# Patient Record
Sex: Male | Born: 1965
Health system: Southern US, Community
[De-identification: ages and names within clinical notes are randomized; demographics above are authoritative.]

## PROBLEM LIST (undated history)

## (undated) DIAGNOSIS — F329 Major depressive disorder, single episode, unspecified: Secondary | ICD-10-CM

## (undated) DIAGNOSIS — R569 Unspecified convulsions: Secondary | ICD-10-CM

## (undated) DIAGNOSIS — I429 Cardiomyopathy, unspecified: Secondary | ICD-10-CM

## (undated) DIAGNOSIS — F32A Depression, unspecified: Secondary | ICD-10-CM

## (undated) DIAGNOSIS — B20 Human immunodeficiency virus [HIV] disease: Secondary | ICD-10-CM

## (undated) DIAGNOSIS — I639 Cerebral infarction, unspecified: Secondary | ICD-10-CM

## (undated) DIAGNOSIS — F17200 Nicotine dependence, unspecified, uncomplicated: Secondary | ICD-10-CM

## (undated) DIAGNOSIS — A159 Respiratory tuberculosis unspecified: Secondary | ICD-10-CM

## (undated) DIAGNOSIS — I10 Essential (primary) hypertension: Secondary | ICD-10-CM

## (undated) HISTORY — PX: FRACTURE SURGERY: SHX138

## (undated) HISTORY — DX: Nicotine dependence, unspecified, uncomplicated: F17.200

## (undated) HISTORY — DX: Human immunodeficiency virus (HIV) disease: B20

## (undated) HISTORY — PX: MULTIPLE TOOTH EXTRACTIONS: SHX2053

---
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
## 2014-10-31 ENCOUNTER — Emergency Department (HOSPITAL_COMMUNITY): Payer: Medicaid Other

## 2014-10-31 ENCOUNTER — Inpatient Hospital Stay (HOSPITAL_COMMUNITY)
Admission: EM | Admit: 2014-10-31 | Discharge: 2014-11-05 | DRG: 062 | Disposition: A | Discharge status: Rehabilitation Facility | Payer: Medicaid Other | Attending: Neurology | Admitting: Neurology

## 2014-10-31 ENCOUNTER — Encounter (HOSPITAL_COMMUNITY): Payer: Self-pay

## 2014-10-31 DIAGNOSIS — I351 Nonrheumatic aortic (valve) insufficiency: Secondary | ICD-10-CM | POA: Diagnosis not present

## 2014-10-31 DIAGNOSIS — I119 Hypertensive heart disease without heart failure: Secondary | ICD-10-CM | POA: Diagnosis present

## 2014-10-31 DIAGNOSIS — R531 Weakness: Secondary | ICD-10-CM | POA: Diagnosis present

## 2014-10-31 DIAGNOSIS — I509 Heart failure, unspecified: Secondary | ICD-10-CM | POA: Diagnosis not present

## 2014-10-31 DIAGNOSIS — R197 Diarrhea, unspecified: Secondary | ICD-10-CM | POA: Diagnosis not present

## 2014-10-31 DIAGNOSIS — G8194 Hemiplegia, unspecified affecting left nondominant side: Secondary | ICD-10-CM | POA: Diagnosis present

## 2014-10-31 DIAGNOSIS — I63421 Cerebral infarction due to embolism of right anterior cerebral artery: Secondary | ICD-10-CM | POA: Diagnosis present

## 2014-10-31 DIAGNOSIS — R471 Dysarthria and anarthria: Secondary | ICD-10-CM | POA: Diagnosis present

## 2014-10-31 DIAGNOSIS — I5023 Acute on chronic systolic (congestive) heart failure: Secondary | ICD-10-CM | POA: Diagnosis not present

## 2014-10-31 DIAGNOSIS — R339 Retention of urine, unspecified: Secondary | ICD-10-CM | POA: Diagnosis not present

## 2014-10-31 DIAGNOSIS — I10 Essential (primary) hypertension: Secondary | ICD-10-CM | POA: Diagnosis present

## 2014-10-31 DIAGNOSIS — E876 Hypokalemia: Secondary | ICD-10-CM | POA: Diagnosis present

## 2014-10-31 DIAGNOSIS — I429 Cardiomyopathy, unspecified: Secondary | ICD-10-CM | POA: Diagnosis present

## 2014-10-31 DIAGNOSIS — I110 Hypertensive heart disease with heart failure: Secondary | ICD-10-CM | POA: Diagnosis not present

## 2014-10-31 DIAGNOSIS — I6931 Cognitive deficits following cerebral infarction: Secondary | ICD-10-CM | POA: Diagnosis not present

## 2014-10-31 DIAGNOSIS — M6289 Other specified disorders of muscle: Secondary | ICD-10-CM | POA: Diagnosis not present

## 2014-10-31 DIAGNOSIS — E785 Hyperlipidemia, unspecified: Secondary | ICD-10-CM | POA: Diagnosis present

## 2014-10-31 DIAGNOSIS — I6789 Other cerebrovascular disease: Secondary | ICD-10-CM

## 2014-10-31 DIAGNOSIS — F17290 Nicotine dependence, other tobacco product, uncomplicated: Secondary | ICD-10-CM | POA: Diagnosis present

## 2014-10-31 DIAGNOSIS — G8190 Hemiplegia, unspecified affecting unspecified side: Secondary | ICD-10-CM | POA: Diagnosis not present

## 2014-10-31 DIAGNOSIS — I63521 Cerebral infarction due to unspecified occlusion or stenosis of right anterior cerebral artery: Secondary | ICD-10-CM | POA: Diagnosis present

## 2014-10-31 DIAGNOSIS — I63321 Cerebral infarction due to thrombosis of right anterior cerebral artery: Secondary | ICD-10-CM | POA: Diagnosis not present

## 2014-10-31 DIAGNOSIS — I639 Cerebral infarction, unspecified: Secondary | ICD-10-CM

## 2014-10-31 DIAGNOSIS — F1210 Cannabis abuse, uncomplicated: Secondary | ICD-10-CM | POA: Diagnosis present

## 2014-10-31 DIAGNOSIS — Z720 Tobacco use: Secondary | ICD-10-CM | POA: Diagnosis not present

## 2014-10-31 DIAGNOSIS — F17210 Nicotine dependence, cigarettes, uncomplicated: Secondary | ICD-10-CM | POA: Diagnosis present

## 2014-10-31 HISTORY — DX: Cardiomyopathy, unspecified: I42.9

## 2014-10-31 HISTORY — DX: Cerebral infarction, unspecified: I63.9

## 2014-10-31 HISTORY — DX: Essential (primary) hypertension: I10

## 2014-10-31 LAB — RAPID URINE DRUG SCREEN, HOSP PERFORMED
Amphetamines: NOT DETECTED
BENZODIAZEPINES: NOT DETECTED
Barbiturates: NOT DETECTED
Cocaine: NOT DETECTED
OPIATES: NOT DETECTED
TETRAHYDROCANNABINOL: POSITIVE — AB

## 2014-10-31 LAB — URINALYSIS, ROUTINE W REFLEX MICROSCOPIC
Bilirubin Urine: NEGATIVE
Glucose, UA: NEGATIVE mg/dL
Hgb urine dipstick: NEGATIVE
Ketones, ur: NEGATIVE mg/dL
Leukocytes, UA: NEGATIVE
NITRITE: NEGATIVE
PH: 6.5 (ref 5.0–8.0)
Protein, ur: 30 mg/dL — AB
Specific Gravity, Urine: 1.019 (ref 1.005–1.030)
Urobilinogen, UA: 1 mg/dL (ref 0.0–1.0)

## 2014-10-31 LAB — DIFFERENTIAL
BASOS PCT: 0 % (ref 0–1)
Basophils Absolute: 0 10*3/uL (ref 0.0–0.1)
Eosinophils Absolute: 0.5 10*3/uL (ref 0.0–0.7)
Eosinophils Relative: 9 % — ABNORMAL HIGH (ref 0–5)
Lymphocytes Relative: 19 % (ref 12–46)
Lymphs Abs: 1.1 10*3/uL (ref 0.7–4.0)
Monocytes Absolute: 0.5 10*3/uL (ref 0.1–1.0)
Monocytes Relative: 9 % (ref 3–12)
Neutro Abs: 3.7 10*3/uL (ref 1.7–7.7)
Neutrophils Relative %: 63 % (ref 43–77)

## 2014-10-31 LAB — COMPREHENSIVE METABOLIC PANEL
ALBUMIN: 3.2 g/dL — AB (ref 3.5–5.2)
ALK PHOS: 69 U/L (ref 39–117)
ALT: 16 U/L (ref 0–53)
AST: 38 U/L — ABNORMAL HIGH (ref 0–37)
Anion gap: 8 (ref 5–15)
BUN: 16 mg/dL (ref 6–23)
CALCIUM: 8.6 mg/dL (ref 8.4–10.5)
CO2: 23 mmol/L (ref 19–32)
Chloride: 105 mmol/L (ref 96–112)
Creatinine, Ser: 1.17 mg/dL (ref 0.50–1.35)
GFR calc non Af Amer: 73 mL/min — ABNORMAL LOW (ref >90)
GFR, EST AFRICAN AMERICAN: 84 mL/min — AB (ref >90)
Glucose, Bld: 119 mg/dL — ABNORMAL HIGH (ref 70–99)
POTASSIUM: 2.9 mmol/L — AB (ref 3.5–5.1)
SODIUM: 136 mmol/L (ref 135–145)
Total Bilirubin: 0.7 mg/dL (ref 0.3–1.2)
Total Protein: 7.7 g/dL (ref 6.0–8.3)

## 2014-10-31 LAB — I-STAT CHEM 8, ED
BUN: 18 mg/dL (ref 6–23)
CHLORIDE: 102 mmol/L (ref 96–112)
Calcium, Ion: 1.11 mmol/L — ABNORMAL LOW (ref 1.12–1.23)
Creatinine, Ser: 1 mg/dL (ref 0.50–1.35)
Glucose, Bld: 123 mg/dL — ABNORMAL HIGH (ref 70–99)
HEMATOCRIT: 44 % (ref 39.0–52.0)
HEMOGLOBIN: 15 g/dL (ref 13.0–17.0)
Potassium: 2.9 mmol/L — ABNORMAL LOW (ref 3.5–5.1)
SODIUM: 140 mmol/L (ref 135–145)
TCO2: 20 mmol/L (ref 0–100)

## 2014-10-31 LAB — CBG MONITORING, ED
Glucose-Capillary: 120 mg/dL — ABNORMAL HIGH (ref 70–99)
Glucose-Capillary: 136 mg/dL — ABNORMAL HIGH (ref 70–99)

## 2014-10-31 LAB — GLUCOSE, CAPILLARY
GLUCOSE-CAPILLARY: 77 mg/dL (ref 70–99)
Glucose-Capillary: 146 mg/dL — ABNORMAL HIGH (ref 70–99)
Glucose-Capillary: 114 mg/dL — ABNORMAL HIGH (ref 70–99)
Glucose-Capillary: 85 mg/dL (ref 70–99)

## 2014-10-31 LAB — ETHANOL

## 2014-10-31 LAB — CBC
HCT: 40.8 % (ref 39.0–52.0)
Hemoglobin: 12.9 g/dL — ABNORMAL LOW (ref 13.0–17.0)
MCH: 29.9 pg (ref 26.0–34.0)
MCHC: 31.6 g/dL (ref 30.0–36.0)
MCV: 94.4 fL (ref 78.0–100.0)
Platelets: 152 10*3/uL (ref 150–400)
RBC: 4.32 MIL/uL (ref 4.22–5.81)
RDW: 14.6 % (ref 11.5–15.5)
WBC: 5.9 10*3/uL (ref 4.0–10.5)

## 2014-10-31 LAB — URINE MICROSCOPIC-ADD ON

## 2014-10-31 LAB — APTT: aPTT: 32 seconds (ref 24–37)

## 2014-10-31 LAB — I-STAT TROPONIN, ED: TROPONIN I, POC: 0.07 ng/mL (ref 0.00–0.08)

## 2014-10-31 LAB — MRSA PCR SCREENING: MRSA BY PCR: NEGATIVE

## 2014-10-31 LAB — PROTIME-INR
INR: 1.28 (ref 0.00–1.49)
PROTHROMBIN TIME: 16.1 s — AB (ref 11.6–15.2)

## 2014-10-31 LAB — POTASSIUM: Potassium: 3.3 mmol/L — ABNORMAL LOW (ref 3.5–5.1)

## 2014-10-31 MED ORDER — SODIUM CHLORIDE 0.9 % IV SOLN
INTRAVENOUS | Status: DC
  Administered 2014-10-31 – 2014-11-01 (×2): via INTRAVENOUS

## 2014-10-31 MED ORDER — POTASSIUM CHLORIDE CRYS ER 20 MEQ PO TBCR: 20.0000 meq | EXTENDED_RELEASE_TABLET | Freq: Two times a day (BID) | ORAL | Status: DC

## 2014-10-31 MED ORDER — LABETALOL HCL 5 MG/ML IV SOLN
20.0000 mg | INTRAVENOUS | Status: DC | PRN
  Administered 2014-10-31 – 2014-11-05 (×16): 20 mg via INTRAVENOUS
  Filled 2014-10-31 (×16): qty 4

## 2014-10-31 MED ORDER — NICARDIPINE HCL IN NACL 20-0.86 MG/200ML-% IV SOLN
3.0000 mg/h | Freq: Once | INTRAVENOUS | Status: AC
  Administered 2014-10-31: 5 mg/h via INTRAVENOUS
  Filled 2014-10-31: qty 200

## 2014-10-31 MED ORDER — LABETALOL HCL 5 MG/ML IV SOLN
20.0000 mg | Freq: Once | INTRAVENOUS | Status: AC
  Administered 2014-10-31: 20 mg via INTRAVENOUS

## 2014-10-31 MED ORDER — ALTEPLASE (STROKE) FULL DOSE INFUSION
90.0000 mg | Freq: Once | INTRAVENOUS | Status: AC
  Administered 2014-10-31: 90 mg via INTRAVENOUS
  Filled 2014-10-31: qty 90

## 2014-10-31 MED ORDER — POTASSIUM CHLORIDE 10 MEQ/100ML IV SOLN
10.0000 meq | Freq: Once | INTRAVENOUS | Status: DC
  Filled 2014-10-31: qty 100

## 2014-10-31 MED ORDER — WHITE PETROLATUM GEL
Status: AC
  Filled 2014-10-31: qty 1

## 2014-10-31 MED ORDER — ACETAMINOPHEN 325 MG PO TABS
650.0000 mg | ORAL_TABLET | ORAL | Status: DC | PRN
  Administered 2014-11-03: 650 mg via ORAL
  Filled 2014-10-31: qty 2

## 2014-10-31 MED ORDER — STROKE: EARLY STAGES OF RECOVERY BOOK
Freq: Once | Status: DC
  Filled 2014-10-31: qty 1

## 2014-10-31 MED ORDER — POTASSIUM CHLORIDE 10 MEQ/100ML IV SOLN
10.0000 meq | INTRAVENOUS | Status: AC
  Administered 2014-10-31 – 2014-11-01 (×2): 10 meq via INTRAVENOUS
  Filled 2014-10-31 (×2): qty 100

## 2014-10-31 MED ORDER — POTASSIUM CHLORIDE 10 MEQ/100ML IV SOLN
10.0000 meq | Freq: Once | INTRAVENOUS | Status: AC
  Administered 2014-10-31: 10 meq via INTRAVENOUS

## 2014-10-31 MED ORDER — LABETALOL HCL 5 MG/ML IV SOLN
10.0000 mg | Freq: Once | INTRAVENOUS | Status: DC
  Filled 2014-10-31: qty 4

## 2014-10-31 MED ORDER — PANTOPRAZOLE SODIUM 40 MG IV SOLR
40.0000 mg | Freq: Every day | INTRAVENOUS | Status: DC
  Administered 2014-10-31 – 2014-11-04 (×5): 40 mg via INTRAVENOUS
  Filled 2014-10-31 (×6): qty 40

## 2014-10-31 MED ORDER — LABETALOL HCL 5 MG/ML IV SOLN
10.0000 mg | INTRAVENOUS | Status: DC | PRN
  Administered 2014-11-01 – 2014-11-03 (×4): 10 mg via INTRAVENOUS
  Filled 2014-10-31 (×3): qty 4

## 2014-10-31 MED ORDER — LORAZEPAM 2 MG/ML IJ SOLN
2.0000 mg | Freq: Once | INTRAMUSCULAR | Status: AC
  Administered 2014-11-01: 2 mg via INTRAVENOUS
  Filled 2014-10-31: qty 1

## 2014-10-31 MED ORDER — ACETAMINOPHEN 650 MG RE SUPP: 650.0000 mg | RECTAL | Status: DC | PRN

## 2014-10-31 MED ORDER — SODIUM CHLORIDE 0.9 % IV SOLN: 50.0000 mL | Freq: Once | INTRAVENOUS | Status: AC

## 2014-10-31 NOTE — ED Notes (Signed)
Pt transported to MRI with this RN. 

## 2014-10-31 NOTE — ED Provider Notes (Signed)
CSN: [id number]     Arrival date & time 10/31/14  0457 History   First MD Initiated Contact with Patient 10/31/14 0507     Chief complaint: Code stroke  (Consider location/radiation/quality/duration/timing/severity/associated sxs/prior Treatment) The history is provided by the patient and the spouse.   49 year old male with history of hypertension for which she does not take any medication got up to walk to the bathroom and noted on walking back fed his legs were not working well. He was having difficulty moving his left leg. Is also having some difficulty with his left arm. His wife noted that he was on the floor and couldn't get up. Speech was noted to be slurred initially. EMS was called to bring him to the hospital. He denies any headache and denies chest pain. There is no nausea or vomiting. He is a nonsmoker and occasional drinker.  No past medical history on file. No past surgical history on file. No family history on file. History  Substance Use Topics  . Smoking status: Not on file  . Smokeless tobacco: Not on file  . Alcohol Use: Not on file    Review of Systems  All other systems reviewed and are negative.     Allergies  Review of patient's allergies indicates not on file.  Home Medications   Prior to Admission medications   Not on File   There were no vitals taken for this visit. Physical Exam  Nursing note and vitals reviewed.  49 year old male, resting comfortably and in no acute distress. Vital signs are significant for hypertension. Oxygen saturation is 97%, which is normal. Head is normocephalic and atraumatic. PERRLA, EOMI. Oropharynx is clear. Neck is nontender and supple without adenopathy or JVD. There are no carotid bruits. Back is nontender and there is no CVA tenderness. Lungs are clear without rales, wheezes, or rhonchi. Chest is nontender. Heart has regular rate and rhythm without murmur. Abdomen is soft, flat, nontender without masses or  hepatosplenomegaly and peristalsis is normoactive. Extremities have no cyanosis or edema, full range of motion is present. Skin is warm and dry without rash. Neurologic: He is awake, but slow to respond. Speech is not dysarthric. Cranial nerves are intact. He does not voluntarily move his left arm or leg, but when his left arm is lifted, it is held in that position. When dropped, he will not allow it to hit his forehead. There are no Babinski reflexes.  ED Course  Procedures (including critical care time) Labs Review Labs Reviewed  CBC - Abnormal; Notable for the following:    Hemoglobin 12.9 (*)    All other components within normal limits  DIFFERENTIAL - Abnormal; Notable for the following:    Eosinophils Relative 9 (*)    All other components within normal limits  I-STAT CHEM 8, ED - Abnormal; Notable for the following:    Potassium 2.9 (*)    Glucose, Bld 123 (*)    Calcium, Ion 1.11 (*)    All other components within normal limits  CBG MONITORING, ED - Abnormal; Notable for the following:    Glucose-Capillary 120 (*)    All other components within normal limits  ETHANOL  PROTIME-INR  APTT  COMPREHENSIVE METABOLIC PANEL  URINE RAPID DRUG SCREEN (HOSP PERFORMED)  URINALYSIS, ROUTINE W REFLEX MICROSCOPIC  I-STAT TROPOININ, ED  I-STAT TROPOININ, ED    Imaging Review Ct Head Wo Contrast  10/31/2014   CLINICAL DATA:  Code stroke.  Slurred speech in extremity weakness.  EXAM:  CT HEAD WITHOUT CONTRAST  TECHNIQUE: Contiguous axial images were obtained from the base of the skull through the vertex without intravenous contrast.  COMPARISON:  None.  FINDINGS: Ventricles and sulci appear symmetrical. Mild low-attenuation change in the deep white matter consistent small vessel ischemia. No ventricular dilatation. Old lacunar infarct in the pons. No mass effect or midline shift. No abnormal extra-axial fluid collections. Gray-white matter junctions are distinct. Basal cisterns are not  effaced. No evidence of acute intracranial hemorrhage. No depressed skull fractures. Mucosal thickening in the paranasal sinuses. Mastoid air cells are not opacified.  IMPRESSION: No acute intracranial abnormalities. Mild chronic small vessel ischemic changes. Old lacunar infarct in the pons.  These results were called by telephone at the time of interpretation on 10/31/2014 at 5:10 am to Dr. Dione Booze , who verbally acknowledged these results.   Electronically Signed   By: Burman Nieves M.D.   On: 10/31/2014 05:12   Mr Brain Ltd W/o Cm  10/31/2014   CLINICAL DATA:  Initial evaluation for acute stroke, sudden onset of inability to walk as well as use left arm.  EXAM: MRI HEAD WITHOUT CONTRAST  TECHNIQUE: Multiplanar, multiecho pulse sequences of the brain and surrounding structures were obtained without intravenous contrast.  COMPARISON:  Prior CT performed earlier on the same day.  FINDINGS: Study is limited as patient was only able to tolerate diffusion-weighted sequences.  Diffusion-weighted imaging demonstrates focal restricted diffusion within the right ACA territory involving the right parasagittal frontal lobe. There appears to be involvement of both the anterior genu, body, and posterior aspect of the corpus callosum. There is corresponding signal dropout on ADC map.  No other area of acute infarct. No other definite acute abnormality identified.  IMPRESSION: Acute ischemic right ACA territory infarct. Please note that this study is limited as only axial diffusion-weighted sequences were performed. No other definite intracranial abnormality.   Electronically Signed   By: Rise Mu M.D.   On: 10/31/2014 06:36   Images viewed by me, discussed with radiologist.   EKG Interpretation   Date/Time:  Saturday October 31 2014 05:16:11 EDT Ventricular Rate:  85 PR Interval:  197 QRS Duration: 110 QT Interval:  401 QTC Calculation: 477 R Axis:   -53 Text Interpretation:  Sinus rhythm Atrial  premature complexes LVH with  secondary repolarization abnormality Borderline prolonged QT interval  Baseline wander in lead(s) I III aVL No old tracing to compare Confirmed  by [facility]  MD, Kawanda Drumheller ([id number]) on 10/31/2014 5:26:07 AM     CRITICAL CARE Performed by: UEAVW,UJWJX Total critical care time: 50 minutes Critical care time was exclusive of separately billable procedures and treating other patients. Critical care was necessary to treat or prevent imminent or life-threatening deterioration. Critical care was time spent personally by me on the following activities: development of treatment plan with patient and/or surrogate as well as nursing, discussions with consultants, evaluation of patient's response to treatment, examination of patient, obtaining history from patient or surrogate, ordering and performing treatments and interventions, ordering and review of laboratory studies, ordering and review of radiographic studies, pulse oximetry and re-evaluation of patient's condition.  MDM   Final diagnoses:  Stroke  Essential hypertension  Hypokalemia    Left-sided weakness that would be worrisome for stroke, but picture is also somewhat concerning for conversion reaction. Uncontrolled hypertension. He is being given labetalol for blood pressure control. He is seen along with Dr. Hosie Poisson, neuro-hospitalist. He will be sent for diffusion-weighted MRI to determine whether he is  having an acute stroke.  MRI does confirm a stroke although it is somewhat atypical in appearance. Dr. Hosie Poisson is here to initiate thrombolytic therapy. Blood pressure is down but needs to be brought down further prior to initiating thrombolytic therapy. He is also noted to be hypokalemic and is given intravenous potassium.  Dione Booze, MD 10/31/14 [phone]

## 2014-10-31 NOTE — Progress Notes (Signed)
Late entry:  Notified by RRRN that tPA needed at 0612. Delivered to bedside at [phone].   Vernard GamblesVeronda Alvis Pulcini, PharmD, BCPS 10/31/2014 7:37 AM

## 2014-10-31 NOTE — ED Notes (Signed)
Neuro at bedside.

## 2014-10-31 NOTE — ED Notes (Addendum)
Attempted to call report

## 2014-10-31 NOTE — ED Notes (Addendum)
Pt went to bathroom at 0400, upon retuning to bed the pt stated that his legs felt like jelly and were weak pt fell on bed

## 2014-10-31 NOTE — ED Notes (Signed)
Spoke with dr Amada Jupiterkirkpatrick regarding bleeding gums, MD assessed patient and states to continue tpa administration, pt also became diaphoretic and repetitive questioning noted, Neuro aware.CBg checked and 136

## 2014-10-31 NOTE — ED Notes (Signed)
Pts gums bleeding rapid response notified

## 2014-10-31 NOTE — ED Notes (Signed)
Attempted to call report

## 2014-10-31 NOTE — Progress Notes (Signed)
  Echocardiogram 2D Echocardiogram has been performed.  [name] Short 10/31/2014, 9:45 AM

## 2014-10-31 NOTE — Progress Notes (Signed)
Chaplain responded to request by RN to provide support to wife, who was teary.  Wife Ike BeneSheriviam and daughter Elmarie Shileyiffany were bedside. Provided emotional support and orientation to services. Offered drinks.  Checked back with daughter who was waiting while patient is undergoing a test.  Wife has stepped outside.  Will check back with her.  Please call as continued support is needed.   Debby Budalacios, Arsenio Schnorr MarshallN, IowaChaplain [phone]

## 2014-10-31 NOTE — Consult Note (Addendum)
Stroke Consult    Chief Complaint: left sided weakness  HPI: [name] Short is an 49 y.o. male history of HTN presenting with acute onset of left sided weakness. Per patient, he awoke normal at 0400, went to the bathroom, on the way back he fell due to weakness of his left side. Upon EMS arrival he was noted to have BP of 220/110 and appeared to be slumping to the left. They note his mental status appeared to fluctuate. Family reports poor compliance with his home BP medications.   CT head imaging reviewed and no acute process. Due to inconsistent nature of exam he was taken for a DWI which showed acute infarct. Initial NIHSS of 7.   Date last known well: 10/31/2014 Time last known well: 0400 tPA Given: yes, patient had refractory hypertension requiring multiple doses of IV labetalol and a cardene drip. This resulted in a delay in initiating tPA.   No past medical history on file.  No past surgical history on file.  No family history on file. Social History:  has no tobacco, alcohol, and drug history on file.  Allergies: Allergies not on file   (Not in a hospital admission)  ROS: Out of a complete 14 system review, the patient complains of only the following symptoms, and all other reviewed systems are negative. + weakness  Physical Examination: There were no vitals filed for this visit. Physical Exam  Constitutional: He appears well-developed and well-nourished.  Psych: Affect appropriate to situation Eyes: No scleral injection HENT: No OP obstrucion Head: Normocephalic.  Cardiovascular: Normal rate and regular rhythm.  Respiratory: Effort normal and breath sounds normal.  GI: Soft. Bowel sounds are normal. No distension. There is no tenderness.  Skin: WDI   Neurologic Examination: Mental Status: Lethargic but easily aroused by voice, follows simple and multi-step commands.  Speech fluent without evidence of aphasia.  Mild to moderate dysarthria. Cranial Nerves: II:  funduscopic exam wnl bilaterally, visual fields grossly normal, pupils equal, round, reactive to light and accommodation III,IV, VI: ptosis not present, extra-ocular motions intact bilaterally V,VII: smile symmetric, facial light touch sensation normal bilaterally VIII: hearing normal bilaterally IX,X: gag reflex present XI: trapezius strength/neck flexion strength normal bilaterally XII: tongue strength normal  Motor: LUE pronator drift. Arm drops when lifted but always protects his face RUE 5/5 strength LLE unable to raise off the bed but + Hoover sign RLE 5/5 strength Sensory: Pinprick and light touch intact throughout, bilaterally Deep Tendon Reflexes: 2+ and symmetric throughout Plantars: Right: downgoing   Left: downgoing Cerebellar: Dysmetria with fTN on the left Gait: deferred  Laboratory Studies:   Basic Metabolic Panel:  Recent Labs Lab 10/31/14 0505  NA 140  K 2.9*  CL 102  GLUCOSE 123*  BUN 18  CREATININE 1.00    Liver Function Tests: No results for input(s): AST, ALT, ALKPHOS, BILITOT, PROT, ALBUMIN in the last 168 hours. No results for input(s): LIPASE, AMYLASE in the last 168 hours. No results for input(s): AMMONIA in the last 168 hours.  CBC:  Recent Labs Lab 10/31/14 0501 10/31/14 0505  WBC 5.9  --   NEUTROABS 3.7  --   HGB 12.9* 15.0  HCT 40.8 44.0  MCV 94.4  --   PLT 152  --     Cardiac Enzymes: No results for input(s): CKTOTAL, CKMB, CKMBINDEX, TROPONINI in the last 168 hours.  BNP: Invalid input(s): POCBNP  CBG:  Recent Labs Lab 10/31/14 0513  GLUCAP 120*    Microbiology: No results  found for this or any previous visit.  Coagulation Studies: No results for input(s): LABPROT, INR in the last 72 hours.  Urinalysis: No results for input(s): COLORURINE, LABSPEC, PHURINE, GLUCOSEU, HGBUR, BILIRUBINUR, KETONESUR, PROTEINUR, UROBILINOGEN, NITRITE, LEUKOCYTESUR in the last 168 hours.  Invalid input(s): APPERANCEUR  Lipid Panel:   No results found for: CHOL, TRIG, HDL, CHOLHDL, VLDL, LDLCALC  HgbA1C: No results found for: HGBA1C  Urine Drug Screen:  No results found for: LABOPIA, COCAINSCRNUR, LABBENZ, AMPHETMU, THCU, LABBARB  Alcohol Level: No results for input(s): ETH in the last 168 hours.  Other results: EKG: normal EKG, normal sinus rhythm.  Imaging: Ct Head Wo Contrast  10/31/2014   CLINICAL DATA:  Code stroke.  Slurred speech in extremity weakness.  EXAM: CT HEAD WITHOUT CONTRAST  TECHNIQUE: Contiguous axial images were obtained from the base of the skull through the vertex without intravenous contrast.  COMPARISON:  None.  FINDINGS: Ventricles and sulci appear symmetrical. Mild low-attenuation change in the deep white matter consistent small vessel ischemia. No ventricular dilatation. Old lacunar infarct in the pons. No mass effect or midline shift. No abnormal extra-axial fluid collections. Gray-white matter junctions are distinct. Basal cisterns are not effaced. No evidence of acute intracranial hemorrhage. No depressed skull fractures. Mucosal thickening in the paranasal sinuses. Mastoid air cells are not opacified.  IMPRESSION: No acute intracranial abnormalities. Mild chronic small vessel ischemic changes. Old lacunar infarct in the pons.  These results were called by telephone at the time of interpretation on 10/31/2014 at 5:10 am to Dr. Dione Booze , who verbally acknowledged these results.   Electronically Signed   By: Burman Nieves M.D.   On: 10/31/2014 05:12    Assessment: 49 y.o. male history of poorly controlled hypertension presenting with acute onset left sided weakness. Exam inconsistent so MRI brain ordered. Shows acute infarct. IV tPA ordered. Required IV cardene to get blood pressure under control. There is a delay in administering IV tPA due to uncontrolled BP requiring multiple medications. (IV labetalol and cardene gtt)   Plan: 1. HgbA1c, fasting lipid panel 2. MRI, MRA  of the brain without  contrast 3. PT consult, OT consult, Speech consult 4. Echocardiogram 5. Carotid dopplers 6. Prophylactic therapy-hold 24hrs post tPA 7. Risk factor modification 8. Telemetry monitoring 9. Frequent neuro checks 10. NPO until RN stroke swallow screen   This patient is critically ill and at significant risk of neurological worsening, death and care requires constant monitoring of vital signs, hemodynamics,respiratory and cardiac monitoring,review of multiple databases, neurological assessment, discussion with family, other specialists and medical decision making of high complexity. I spent 55 inutes of neurocritical care time in the care of this patient.   Elspeth Cho, DO [facility] [phone]  If 7pm- 7am, please page neurology on call as listed in AMION. 10/31/2014, 5:28 AM

## 2014-10-31 NOTE — ED Notes (Signed)
Pt placed on 2L 

## 2014-11-01 ENCOUNTER — Inpatient Hospital Stay (HOSPITAL_COMMUNITY): Payer: Medicaid Other

## 2014-11-01 ENCOUNTER — Encounter (HOSPITAL_COMMUNITY): Payer: Self-pay | Admitting: *Deleted

## 2014-11-01 DIAGNOSIS — M6289 Other specified disorders of muscle: Secondary | ICD-10-CM

## 2014-11-01 LAB — LIPID PANEL
CHOLESTEROL: 164 mg/dL (ref 0–200)
HDL: 28 mg/dL — AB (ref >39)
LDL Cholesterol: 122 mg/dL — ABNORMAL HIGH (ref 0–99)
Total CHOL/HDL Ratio: 5.9 RATIO
Triglycerides: 72 mg/dL (ref <150)
VLDL: 14 mg/dL (ref 0–40)

## 2014-11-01 LAB — GLUCOSE, CAPILLARY
GLUCOSE-CAPILLARY: 107 mg/dL — AB (ref 70–99)
GLUCOSE-CAPILLARY: 92 mg/dL (ref 70–99)
GLUCOSE-CAPILLARY: 93 mg/dL (ref 70–99)
Glucose-Capillary: 103 mg/dL — ABNORMAL HIGH (ref 70–99)
Glucose-Capillary: 94 mg/dL (ref 70–99)

## 2014-11-01 MED ORDER — CLEVIDIPINE BUTYRATE 0.5 MG/ML IV EMUL
0.0000 mg/h | INTRAVENOUS | Status: DC
  Filled 2014-11-01 (×18): qty 50

## 2014-11-01 MED ORDER — CLONIDINE HCL 0.2 MG/24HR TD PTWK
0.2000 mg | MEDICATED_PATCH | TRANSDERMAL | Status: DC
  Administered 2014-11-01: 0.2 mg via TRANSDERMAL
  Filled 2014-11-01: qty 1

## 2014-11-01 MED ORDER — PNEUMOCOCCAL VAC POLYVALENT 25 MCG/0.5ML IJ INJ
0.5000 mL | INJECTION | INTRAMUSCULAR | Status: AC
Start: 2014-11-02 — End: 2014-11-02
  Administered 2014-11-02: 0.5 mL via INTRAMUSCULAR
  Filled 2014-11-01: qty 0.5

## 2014-11-01 MED ORDER — ASPIRIN 300 MG RE SUPP
300.0000 mg | Freq: Every day | RECTAL | Status: DC
  Filled 2014-11-01: qty 1

## 2014-11-01 MED ORDER — ENOXAPARIN SODIUM 40 MG/0.4ML ~~LOC~~ SOLN
40.0000 mg | SUBCUTANEOUS | Status: DC
  Administered 2014-11-01 – 2014-11-04 (×4): 40 mg via SUBCUTANEOUS
  Filled 2014-11-01 (×4): qty 0.4

## 2014-11-01 NOTE — Progress Notes (Signed)
Bilateral carotid artery duplex completed:  1-39% ICA stenosis.  Vertebral artery flow is antegrade.     

## 2014-11-01 NOTE — H&P (Signed)
Stroke Consult   Chief Complaint: left sided weakness  HPI: [name] Short is an 49 y.o. male history of HTN presenting with acute onset of left sided weakness. Per patient, he awoke normal at 0400, went to the bathroom, on the way back he fell due to weakness of his left side. Upon EMS arrival he was noted to have BP of 220/110 and appeared to be slumping to the left. They note his mental status appeared to fluctuate. Family reports poor compliance with his home BP medications.   CT head imaging reviewed and no acute process. Due to inconsistent nature of exam he was taken for a DWI which showed acute infarct. Initial NIHSS of 7.   Date last known well: 10/31/2014 Time last known well: 0400 tPA Given: yes, patient had refractory hypertension requiring multiple doses of IV labetalol and a cardene drip. This resulted in a delay in initiating tPA.   No past medical history on file.  No past surgical history on file.  No family history on file. Social History:  has no tobacco, alcohol, and drug history on file.  Allergies: Allergies not on file   (Not in a hospital admission)  ROS: Out of a complete 14 system review, the patient complains of only the following symptoms, and all other reviewed systems are negative. + weakness  Physical Examination: There were no vitals filed for this visit. Physical Exam  Constitutional: He appears well-developed and well-nourished.  Psych: Affect appropriate to situation Eyes: No scleral injection HENT: No OP obstrucion Head: Normocephalic.  Cardiovascular: Normal rate and regular rhythm.  Respiratory: Effort normal and breath sounds normal.  GI: Soft. Bowel sounds are normal. No distension. There is no tenderness.  Skin: WDI   Neurologic Examination: Mental Status: Lethargic but easily aroused by voice, follows simple and multi-step commands. Speech fluent without evidence of aphasia. Mild to moderate dysarthria. Cranial Nerves: II:  funduscopic exam wnl bilaterally, visual fields grossly normal, pupils equal, round, reactive to light and accommodation III,IV, VI: ptosis not present, extra-ocular motions intact bilaterally V,VII: smile symmetric, facial light touch sensation normal bilaterally VIII: hearing normal bilaterally IX,X: gag reflex present XI: trapezius strength/neck flexion strength normal bilaterally XII: tongue strength normal  Motor: LUE pronator drift. Arm drops when lifted but always protects his face RUE 5/5 strength LLE unable to raise off the bed but + Hoover sign RLE 5/5 strength Sensory: Pinprick and light touch intact throughout, bilaterally Deep Tendon Reflexes: 2+ and symmetric throughout Plantars: Right: downgoingLeft: downgoing Cerebellar: Dysmetria with fTN on the left Gait: deferred  Laboratory Studies:  Basic Metabolic Panel:  Last Labs      Recent Labs Lab 10/31/14 0505  NA 140  K 2.9*  CL 102  GLUCOSE 123*  BUN 18  CREATININE 1.00      Liver Function Tests:  Last Labs     No results for input(s): AST, ALT, ALKPHOS, BILITOT, PROT, ALBUMIN in the last 168 hours.    Last Labs     No results for input(s): LIPASE, AMYLASE in the last 168 hours.    Last Labs     No results for input(s): AMMONIA in the last 168 hours.    CBC:  Last Labs      Recent Labs Lab 10/31/14 0501 10/31/14 0505  WBC 5.9 --   NEUTROABS 3.7 --   HGB 12.9* 15.0  HCT 40.8 44.0  MCV 94.4 --   PLT 152 --       Cardiac Enzymes:  Last Labs     No results for input(s): CKTOTAL, CKMB, CKMBINDEX, TROPONINI in the last 168 hours.    BNP:  Last Labs     Invalid input(s): POCBNP    CBG:  Last Labs      Recent Labs Lab 10/31/14 0513  GLUCAP 120*      Microbiology: No results found for this or any previous visit.  Coagulation Studies:  Recent Labs (last 2 labs)     No results for input(s): LABPROT,  INR in the last 72 hours.     Last Labs     Urinalysis: No results for input(s): COLORURINE, LABSPEC, PHURINE, GLUCOSEU, HGBUR, BILIRUBINUR, KETONESUR, PROTEINUR, UROBILINOGEN, NITRITE, LEUKOCYTESUR in the last 168 hours.  Invalid input(s): APPERANCEUR    Lipid Panel:   Labs (Brief)    No results found for: CHOL, TRIG, HDL, CHOLHDL, VLDL, LDLCALC    HgbA1C:    Recent Labs    No results found for: HGBA1C    Urine Drug Screen:    Labs (Brief)    No results found for: LABOPIA, COCAINSCRNUR, LABBENZ, AMPHETMU, THCU, LABBARB    Alcohol Level:    Last Labs     No results for input(s): ETH in the last 168 hours.    Other results:  EKG: normal EKG, normal sinus rhythm.  Imaging:  Imaging Results (Last 48 hours)    Ct Head Wo Contrast  10/31/2014 CLINICAL DATA: Code stroke. Slurred speech in extremity weakness. EXAM: CT HEAD WITHOUT CONTRAST TECHNIQUE: Contiguous axial images were obtained from the base of the skull through the vertex without intravenous contrast. COMPARISON: None. FINDINGS: Ventricles and sulci appear symmetrical. Mild low-attenuation change in the deep white matter consistent small vessel ischemia. No ventricular dilatation. Old lacunar infarct in the pons. No mass effect or midline shift. No abnormal extra-axial fluid collections. Gray-white matter junctions are distinct. Basal cisterns are not effaced. No evidence of acute intracranial hemorrhage. No depressed skull fractures. Mucosal thickening in the paranasal sinuses. Mastoid air cells are not opacified. IMPRESSION: No acute intracranial abnormalities. Mild chronic small vessel ischemic changes. Old lacunar infarct in the pons. These results were called by telephone at the time of interpretation on 10/31/2014 at 5:10 am to Dr. Dione Booze , who verbally acknowledged these results. Electronically Signed By: Burman Nieves M.D. On: 10/31/2014 05:12     Assessment: 49 y.o. male history of poorly  controlled hypertension presenting with acute onset left sided weakness. Exam inconsistent so MRI brain ordered. Shows acute infarct. IV tPA ordered. Required IV cardene to get blood pressure under control. There is a delay in administering IV tPA due to uncontrolled BP requiring multiple medications. (IV labetalol and cardene gtt)   Plan: 1. HgbA1c, fasting lipid panel 2. MRI, MRA of the brain without contrast 3. PT consult, OT consult, Speech consult 4. Echocardiogram 5. Carotid dopplers 6. Prophylactic therapy-hold 24hrs post tPA 7. Risk factor modification 8. Telemetry monitoring 9. Frequent neuro checks 10. NPO until RN stroke swallow screen   This patient is critically ill and at significant risk of neurological worsening, death and care requires constant monitoring of vital signs, hemodynamics,respiratory and cardiac monitoring,review of multiple databases, neurological assessment, discussion with family, other specialists and medical decision making of high complexity. I spent 55 inutes of neurocritical care time in the care of this patient.   Elspeth Cho, DO [facility] [phone]  If 7pm- 7am, please page neurology on call as listed in AMION. 10/31/2014, 5:28 AM

## 2014-11-01 NOTE — Progress Notes (Signed)
Stroke Team Progress Note  HISTORY [name] Short is an 49 y.o. male history of HTN presenting with acute onset of left sided weakness. Per patient, he awoke normal at 0400, went to the bathroom, on the way back he fell due to weakness of his left side. Upon EMS arrival he was noted to have BP of 220/110 and appeared to be slumping to the left. They note his mental status appeared to fluctuate. Family reports poor compliance with his home BP medications.   CT head imaging reviewed and no acute process. Due to inconsistent nature of exam he was taken for a DWI which showed acute infarct. Initial NIHSS of 7.   Date last known well: 10/31/2014 Time last known well: 0400 tPA Given: yes, patient had refractory hypertension requiring multiple doses of IV labetalol and a cardene drip. This resulted in a delay in initiating tPA.     SUBJECTIVE   OBJECTIVE Most recent Vital Signs: Filed Vitals:   11/01/14 0600 11/01/14 0630 11/01/14 0645 11/01/14 0824  BP: 149/105 151/103 160/91   Pulse: 63 60 72   Temp:    98.5 F (36.9 C)  TempSrc:    Oral  Resp: 15     Weight:      SpO2: 100%      CBG (last 3)   Recent Labs  10/31/14 2331 11/01/14 0341 11/01/14 0826  GLUCAP 107* 103* 94    IV Fluid Intake:   . sodium chloride 50 mL/hr at 10/31/14 0815  . clevidipine      MEDICATIONS  .  stroke: mapping our early stages of recovery book   Does not apply Once  . labetalol  10 mg Intravenous Once  . pantoprazole (PROTONIX) IV  40 mg Intravenous QHS  . potassium chloride  10 mEq Intravenous Once   PRN:  acetaminophen **OR** acetaminophen, labetalol, labetalol  Diet:  Diet NPO time specified  Activity:  Bedrest DVT Prophylaxis:  SCD  CLINICALLY SIGNIFICANT STUDIES Basic Metabolic Panel:  Recent Labs Lab 10/31/14 0501 10/31/14 0505 10/31/14 1955  NA 136 140  --   K 2.9* 2.9* 3.3*  CL 105 102  --   CO2 23  --   --   GLUCOSE 119* 123*  --   BUN 16 18  --   CREATININE 1.17 1.00  --    CALCIUM 8.6  --   --    Liver Function Tests:  Recent Labs Lab 10/31/14 0501  AST 38*  ALT 16  ALKPHOS 69  BILITOT 0.7  PROT 7.7  ALBUMIN 3.2*   CBC:  Recent Labs Lab 10/31/14 0501 10/31/14 0505  WBC 5.9  --   NEUTROABS 3.7  --   HGB 12.9* 15.0  HCT 40.8 44.0  MCV 94.4  --   PLT 152  --    Coagulation:  Recent Labs Lab 10/31/14 0501  LABPROT 16.1*  INR 1.28   Cardiac Enzymes: No results for input(s): CKTOTAL, CKMB, CKMBINDEX, TROPONINI in the last 168 hours. Urinalysis:  Recent Labs Lab 10/31/14 0624  COLORURINE YELLOW  LABSPEC 1.019  PHURINE 6.5  GLUCOSEU NEGATIVE  HGBUR NEGATIVE  BILIRUBINUR NEGATIVE  KETONESUR NEGATIVE  PROTEINUR 30*  UROBILINOGEN 1.0  NITRITE NEGATIVE  LEUKOCYTESUR NEGATIVE   Lipid Panel    Component Value Date/Time   CHOL 164 11/01/2014 0601   TRIG 72 11/01/2014 0601   HDL 28* 11/01/2014 0601   CHOLHDL 5.9 11/01/2014 0601   VLDL 14 11/01/2014 0601   LDLCALC 122* 11/01/2014 0601  HgbA1C No results found for: HGBA1C  Urine Drug Screen:     Component Value Date/Time   LABOPIA NONE DETECTED 10/31/2014 0624   COCAINSCRNUR NONE DETECTED 10/31/2014 0624   LABBENZ NONE DETECTED 10/31/2014 0624   AMPHETMU NONE DETECTED 10/31/2014 0624   THCU POSITIVE* 10/31/2014 0624   LABBARB NONE DETECTED 10/31/2014 0624    Alcohol Level:  Recent Labs Lab 10/31/14 0500  ETH <5    Ct Head Wo Contrast  10/31/2014   CLINICAL DATA:  Code stroke.  Slurred speech in extremity weakness.  EXAM: CT HEAD WITHOUT CONTRAST  TECHNIQUE: Contiguous axial images were obtained from the base of the skull through the vertex without intravenous contrast.  COMPARISON:  None.  FINDINGS: Ventricles and sulci appear symmetrical. Mild low-attenuation change in the deep white matter consistent small vessel ischemia. No ventricular dilatation. Old lacunar infarct in the pons. No mass effect or midline shift. No abnormal extra-axial fluid collections. Gray-white  matter junctions are distinct. Basal cisterns are not effaced. No evidence of acute intracranial hemorrhage. No depressed skull fractures. Mucosal thickening in the paranasal sinuses. Mastoid air cells are not opacified.  IMPRESSION: No acute intracranial abnormalities. Mild chronic small vessel ischemic changes. Old lacunar infarct in the pons.  These results were called by telephone at the time of interpretation on 10/31/2014 at 5:10 am to Dr. Dione Booze , who verbally acknowledged these results.   Electronically Signed   By: Burman Nieves M.D.   On: 10/31/2014 05:12   Mr Brain Wo Contrast  11/01/2014   CLINICAL DATA:  49 year old hypertensive male with acute onset of left-sided weakness. Fell secondary to weakness. Subsequent encounter.  EXAM: MRI HEAD WITHOUT CONTRAST  MRA HEAD WITHOUT CONTRAST  TECHNIQUE: Multiplanar, multiecho pulse sequences of the brain and surrounding structures were obtained without intravenous contrast. Angiographic images of the head were obtained using MRA technique without contrast.  COMPARISON:  10/31/2014 brain MR and head CT.  FINDINGS: MRI HEAD FINDINGS  Acute moderate size nonhemorrhagic right anterior cerebral artery distribution infarct involving the medial aspect of the right frontal and parietal lobe and right aspect of the corpus callosum. Local mass effect.  Tiny blood breakdown products cerebellum bilaterally may reflect result of prior hemorrhagic ischemia or prior trauma.  Moderate white matter type changes periventricular region advanced for patient's age and most likely related to result of small vessel disease in this hypertensive obese patient. Other causes white matter type changes felt to be less likely considerations.  Pontine altered signal intensity may represent prominent perivascular spaces or result of prior small infarct.  No intracranial mass lesion noted on this unenhanced exam.  No hydrocephalus.  Exophthalmos.  Mild mucosal thickening paranasal  sinuses most notable inferior right maxillary sinus with maximal thickness of 5 mm.  Mild cervical spondylotic changes C3-4 mild spinal stenosis. Cervical medullary junction, pituitary region and pineal region unremarkable.  MRA HEAD FINDINGS  Aplastic A1 segment of the left anterior cerebral artery with A2 segment of the anterior cerebral artery supplied from the right bilaterally. Irregularity of the A2 segment of the anterior cerebral bilaterally with abrupt cut off of flow of the A2 segment of the right anterior cerebral artery 1.5 cm above its origin consistent with patient's acute right anterior cerebral artery distribution infarct.  There is a small bulge at the left anterior cerebral artery A1-A2 junction which may represent origin of a small vessel although difficult to completely exclude a a tiny aneurysm. The mild motion degradation somewhat limits evaluation  on source images.  Mild irregularity and minimal narrowing cavernous segment right internal carotid artery.  Fetal type origin of the left posterior cerebral artery.  Middle cerebral artery mild to moderate branch vessel irregularity with narrowing.  Left vertebral artery is poorly delineated and appears to end in a posterior inferior cerebellar artery distribution. Left posterior inferior cerebellar artery with moderate tandem stenoses.  Ectatic right vertebral artery without high-grade stenosis.  Moderate tandem stenosis right posterior inferior cerebellar artery.  Ectatic basilar artery with areas of mild to slight moderate narrowing most notable mid aspect.  Nonvisualized anterior inferior cerebellar arteries.  Mild to moderate irregularity superior cerebellar artery bilaterally.  Mild narrowing proximal right posterior cerebral artery. Fetal type origin of the left posterior cerebral artery. Moderate narrowing portions of P2/P3 and distal branches of the posterior cerebral artery bilaterally.  IMPRESSION: This exam was interpreted during a PACS  downtime with limited availability of comparison cases.  MRI HEAD  Acute moderate size nonhemorrhagic right anterior cerebral artery distribution infarct involving the medial aspect of the right frontal and parietal lobe and right aspect of the corpus callosum. Local mass effect.  Tiny blood breakdown products cerebellum bilaterally may reflect result of prior hemorrhagic ischemia or prior trauma.  Moderate white matter type changes periventricular region advanced for patient's age and most likely related to result of small vessel disease in this hypertensive obese patient.  Pontine altered signal intensity may represent prominent perivascular spaces or result of prior small infarct.  Mild mucosal thickening paranasal sinuses most notable inferior right maxillary sinus with maximal thickness of 5 mm.  MRA HEAD  Aplastic A1 segment of the left anterior cerebral artery with A2 segment of the anterior cerebral artery supplied from the right bilaterally. Irregularity of the A2 segment of the anterior cerebral bilaterally with abrupt cut off of flow of the A2 segment of the right anterior cerebral artery 1.5 cm above its origin consistent with patient's acute right anterior cerebral artery distribution infarct.  There is a small bulge at the left anterior cerebral artery A1-A2 junction which may represent origin of a small vessel although difficult to completely exclude a a tiny aneurysm. The mild motion degradation somewhat limits evaluation on source images.  Middle cerebral artery mild to moderate branch vessel irregularity with narrowing.  Posterior fossa atherosclerotic type changes as detailed above.   Electronically Signed   By: Lacy Duverney M.D.   On: 11/01/2014 09:36   Mr Brain Ltd W/o Cm  10/31/2014   CLINICAL DATA:  Initial evaluation for acute stroke, sudden onset of inability to walk as well as use left arm.  EXAM: MRI HEAD WITHOUT CONTRAST  TECHNIQUE: Multiplanar, multiecho pulse sequences of the brain  and surrounding structures were obtained without intravenous contrast.  COMPARISON:  Prior CT performed earlier on the same day.  FINDINGS: Study is limited as patient was only able to tolerate diffusion-weighted sequences.  Diffusion-weighted imaging demonstrates focal restricted diffusion within the right ACA territory involving the right parasagittal frontal lobe. There appears to be involvement of both the anterior genu, body, and posterior aspect of the corpus callosum. There is corresponding signal dropout on ADC map.  No other area of acute infarct. No other definite acute abnormality identified.  IMPRESSION: Acute ischemic right ACA territory infarct. Please note that this study is limited as only axial diffusion-weighted sequences were performed. No other definite intracranial abnormality.   Electronically Signed   By: Rise Mu M.D.   On: 10/31/2014 06:36  Mr Maxine Glenn Head/brain Wo Cm  11/01/2014   CLINICAL DATA:  49 year old hypertensive male with acute onset of left-sided weakness. Fell secondary to weakness. Subsequent encounter.  EXAM: MRI HEAD WITHOUT CONTRAST  MRA HEAD WITHOUT CONTRAST  TECHNIQUE: Multiplanar, multiecho pulse sequences of the brain and surrounding structures were obtained without intravenous contrast. Angiographic images of the head were obtained using MRA technique without contrast.  COMPARISON:  10/31/2014 brain MR and head CT.  FINDINGS: MRI HEAD FINDINGS  Acute moderate size nonhemorrhagic right anterior cerebral artery distribution infarct involving the medial aspect of the right frontal and parietal lobe and right aspect of the corpus callosum. Local mass effect.  Tiny blood breakdown products cerebellum bilaterally may reflect result of prior hemorrhagic ischemia or prior trauma.  Moderate white matter type changes periventricular region advanced for patient's age and most likely related to result of small vessel disease in this hypertensive obese patient. Other  causes white matter type changes felt to be less likely considerations.  Pontine altered signal intensity may represent prominent perivascular spaces or result of prior small infarct.  No intracranial mass lesion noted on this unenhanced exam.  No hydrocephalus.  Exophthalmos.  Mild mucosal thickening paranasal sinuses most notable inferior right maxillary sinus with maximal thickness of 5 mm.  Mild cervical spondylotic changes C3-4 mild spinal stenosis. Cervical medullary junction, pituitary region and pineal region unremarkable.  MRA HEAD FINDINGS  Aplastic A1 segment of the left anterior cerebral artery with A2 segment of the anterior cerebral artery supplied from the right bilaterally. Irregularity of the A2 segment of the anterior cerebral bilaterally with abrupt cut off of flow of the A2 segment of the right anterior cerebral artery 1.5 cm above its origin consistent with patient's acute right anterior cerebral artery distribution infarct.  There is a small bulge at the left anterior cerebral artery A1-A2 junction which may represent origin of a small vessel although difficult to completely exclude a a tiny aneurysm. The mild motion degradation somewhat limits evaluation on source images.  Mild irregularity and minimal narrowing cavernous segment right internal carotid artery.  Fetal type origin of the left posterior cerebral artery.  Middle cerebral artery mild to moderate branch vessel irregularity with narrowing.  Left vertebral artery is poorly delineated and appears to end in a posterior inferior cerebellar artery distribution. Left posterior inferior cerebellar artery with moderate tandem stenoses.  Ectatic right vertebral artery without high-grade stenosis.  Moderate tandem stenosis right posterior inferior cerebellar artery.  Ectatic basilar artery with areas of mild to slight moderate narrowing most notable mid aspect.  Nonvisualized anterior inferior cerebellar arteries.  Mild to moderate  irregularity superior cerebellar artery bilaterally.  Mild narrowing proximal right posterior cerebral artery. Fetal type origin of the left posterior cerebral artery. Moderate narrowing portions of P2/P3 and distal branches of the posterior cerebral artery bilaterally.  IMPRESSION: This exam was interpreted during a PACS downtime with limited availability of comparison cases.  MRI HEAD  Acute moderate size nonhemorrhagic right anterior cerebral artery distribution infarct involving the medial aspect of the right frontal and parietal lobe and right aspect of the corpus callosum. Local mass effect.  Tiny blood breakdown products cerebellum bilaterally may reflect result of prior hemorrhagic ischemia or prior trauma.  Moderate white matter type changes periventricular region advanced for patient's age and most likely related to result of small vessel disease in this hypertensive obese patient.  Pontine altered signal intensity may represent prominent perivascular spaces or result of prior small infarct.  Mild  mucosal thickening paranasal sinuses most notable inferior right maxillary sinus with maximal thickness of 5 mm.  MRA HEAD  Aplastic A1 segment of the left anterior cerebral artery with A2 segment of the anterior cerebral artery supplied from the right bilaterally. Irregularity of the A2 segment of the anterior cerebral bilaterally with abrupt cut off of flow of the A2 segment of the right anterior cerebral artery 1.5 cm above its origin consistent with patient's acute right anterior cerebral artery distribution infarct.  There is a small bulge at the left anterior cerebral artery A1-A2 junction which may represent origin of a small vessel although difficult to completely exclude a a tiny aneurysm. The mild motion degradation somewhat limits evaluation on source images.  Middle cerebral artery mild to moderate branch vessel irregularity with narrowing.  Posterior fossa atherosclerotic type changes as detailed  above.   Electronically Signed   By: Lacy Duverney M.D.   On: 11/01/2014 09:36    CT of the brain   IMPRESSION: No acute intracranial abnormalities. Mild chronic small vessel ischemic changes. Old lacunar infarct in the pons. MRI of the brain   MRI HEAD  Acute moderate size nonhemorrhagic right anterior cerebral artery distribution infarct involving the medial aspect of the right frontal and parietal lobe and right aspect of the corpus callosum. Local mass effect.  Tiny blood breakdown products cerebellum bilaterally may reflect result of prior hemorrhagic ischemia or prior trauma.  Moderate white matter type changes periventricular region advanced for patient's age and most likely related to result of small vessel disease in this hypertensive obese patient.  Pontine altered signal intensity may represent prominent perivascular spaces or result of prior small infarct.  Mild mucosal thickening paranasal sinuses most notable inferior right maxillary sinus with maximal thickness of 5 mm.  MRA HEAD  Aplastic A1 segment of the left anterior cerebral artery with A2 segment of the anterior cerebral artery supplied from the right bilaterally. Irregularity of the A2 segment of the anterior cerebral bilaterally with abrupt cut off of flow of the A2 segment of the right anterior cerebral artery 1.5 cm above its origin consistent with patient's acute right anterior cerebral artery distribution infarct.  There is a small bulge at the left anterior cerebral artery A1-A2 junction which may represent origin of a small vessel although difficult to completely exclude a a tiny aneurysm. The mild motion degradation somewhat limits evaluation on source images.  Middle cerebral artery mild to moderate branch vessel irregularity with narrowing. MRA of the brain   See above 2D Echocardiogram   Study Conclusions  - Left ventricle: The cavity size was mildly dilated.  Wall thickness was increased in a pattern of severe LVH. Systolic function was moderately reduced. The estimated ejection fraction was in the range of 35% to 40%. Diffuse hypokinesis. Doppler parameters are consistent with elevated ventricular end-diastolic filling pressure. - Left atrium: The atrium was moderately dilated. - Atrial septum: No defect or patent foramen ovale was identified. - Pulmonary arteries: PA peak pressure: 45 mm Hg (S). - Impressions: In the absence of HTN or renal failure cannot r/o amyloid.  Impressions:  - In the absence of HTN or renal failure cannot r/o amyloid. Carotid Doppler    CXR    EKG  Sinus rhythm Atrial premature complexes LVH with secondary repolarization abnormality Borderline prolonged QT interval Baseline wander in lead(s) I III aVL No old tracing to compare  Therapy Recommendations Pending  Physical Exam  General: The patient is sleepy, but will easily  alert. Patient will follow commands  Respiratory: Lung fields are clear  Cardiovascular: Regular rate and rhythm, no murmurs  Abdomen: Obese, nontender, positive bowel sounds  Skin: No significant peripheral edema is noted.   Neurologic Exam  Mental status: The patient is drowsy, can be alerted, will follow commands.   Cranial nerves: Facial symmetry is not present. There is a depression of the left nasolabial fold. Speech is slightly dysarthric, not aphasic. Extraocular movements are full. Visual fields are full.  Motor: The patient has good strength in the right extremities. The patient has 4/5 strength with the left upper extremity, near plegic left lower extremity.  Sensory examination: Soft touch sensation is decreased on the left arm and leg, symmetric on the face  Coordination: The patient has good finger-nose-finger and heel-to-shin on the right, difficulty performing on the left.  Gait and station: The gait could not be tested  Reflexes: Deep tendon  reflexes are symmetric, depressed.    ASSESSMENT Mr. [name] Short is a 49 y.o. male presenting with a right anterior cerebral artery distribution stroke event. The patient received IV TPA. Not on aspirin prior to admission. The patient is admitted to the intensive care unit for management following TPA. Urine drug screen positive for THC, otherwise negative.   Right anterior cerebral artery distribution stroke  Hypertension  Hospital day # 1  TREATMENT/PLAN  Aspirin therapy  Carotid Doppler study pending  Physical, occupational therapy evaluation  Medications for blood pressure  Transfer out of intensive care unit today  Needs speech therapy evaluation for swallowing  WILLIS,CHARLES KEITH  11/01/2014 11:03 AM  [phone]

## 2014-11-01 NOTE — Evaluation (Signed)
Clinical/Bedside Swallow Evaluation Patient Details  Name: [name] Short MRN: [id number] Date of Birth: [birth year]  Today's Date: 11/01/2014 Time: SLP Start Time (ACUTE ONLY): 0215 SLP Stop Time (ACUTE ONLY): 0245 SLP Time Calculation (min) (ACUTE ONLY): 30 min  Past Medical History:  Past Medical History  Diagnosis Date  . Hypertension    Past Surgical History: History reviewed. No pertinent past surgical history. HPI:  [name] Short is an 49 y.o. male history of HTN presenting with acute onset of left sided weakness. Per patient, he awoke normal at 0400, went to the bathroom, on the way back he fell due to weakness of his left side. Upon EMS arrival he was noted to have BP of 220/110 and appeared to be slumping to the left. They note his mental status appeared to fluctuate. Family reports poor compliance with his home BP medications. [name] Short is an 49 y.o. male history of HTN presenting with acute onset of left sided weakness on 10/31/14; Per patient, he awoke normal at 0400, went to the bathroom, on the way back he fell due to weakness of his left side. Upon EMS arrival he was noted to have BP of 220/110 and appeared to be slumping to the left. They noted his mental status appeared to fluctuate. Family reports poor compliance with his home BP medications. MRI revealed on 10/31/14 Acute moderate size nonhemorrhagic right anterior cerebral artery; local mass effect   Assessment / Plan / Recommendation Clinical Impression   Pt presents with neurogenic dysphagia with residual L facial/lingual weakness and impulsivity with various consistencies; he exhibited decreased tongue manipulation/propulsion with all consistencies and a suspected delay in the initiation of the swallow; cough x1 with cracker consistency after swallow, but otherwise, no coughing and/or s/s of aspiration noted during BSE; recommended Dysphagia 2/thin with small sips/bites, alternating liquids/solids and no straws and education  provided to family regarding swallowing precautions/reducing risk of aspiration during feeding d/t impulsivity/full supervision; SLE pending    Aspiration Risk  Mild    Diet Recommendation Dysphagia 2 (Fine chop);Thin liquid (small sips)   Liquid Administration via: Cup;No straw (small sips) Medication Administration: Crushed with puree Supervision: Full supervision/cueing for compensatory strategies Compensations: Slow rate;Small sips/bites;Follow solids with liquid;Effortful swallow Postural Changes and/or Swallow Maneuvers: Seated upright 90 degrees    Other  Recommendations Oral Care Recommendations: Oral care BID   Follow Up Recommendations  Inpatient Rehab    Frequency and Duration min 2x/week  2 weeks   Pertinent Vitals/Pain Elevated BP    SLP Swallow Goals  See POC   Swallow Study Prior Functional Status   Independent    General Date of Onset: 10/31/14 HPI: [name] Short is an 49 y.o. male history of HTN presenting with acute onset of left sided weakness. Per patient, he awoke normal at 0400, went to the bathroom, on the way back he fell due to weakness of his left side. Upon EMS arrival he was noted to have BP of 220/110 and appeared to be slumping to the left. They note his mental status appeared to fluctuate. Family reports poor compliance with his home BP medications. [name] Short is an 49 y.o. male history of HTN presenting with acute onset of left sided weakness on 10/31/14; Per patient, he awoke normal at 0400, went to the bathroom, on the way back he fell due to weakness of his left side. Upon EMS arrival he was noted to have BP of 220/110 and appeared to be slumping to the left. They noted his mental  status appeared to fluctuate. Family reports poor compliance with his home BP medications. MRI revealed on 10/31/14 Acute moderate size nonhemorrhagic right anterior cerebral artery; local mass effect Type of Study: Bedside swallow evaluation Previous Swallow Assessment:  failed swallow screen Diet Prior to this Study: NPO Temperature Spikes Noted: No Respiratory Status: Nasal cannula History of Recent Intubation: No Behavior/Cognition: Cooperative;Impulsive;Requires cueing;Lethargic Oral Cavity - Dentition: Adequate natural dentition Self-Feeding Abilities: Needs assist;Able to feed self Patient Positioning: Upright in bed Baseline Vocal Quality: Low vocal intensity;Clear Volitional Cough: Strong Volitional Swallow: Able to elicit    Oral/Motor/Sensory Function Overall Oral Motor/Sensory Function: Impaired Labial ROM: Reduced left Labial Symmetry: Abnormal symmetry left Labial Strength: Reduced Labial Sensation: Reduced Lingual ROM: Reduced left Lingual Symmetry: Abnormal symmetry left Lingual Strength: Reduced Lingual Sensation: Reduced Facial ROM: Reduced left Facial Symmetry: Left droop Facial Strength: Reduced Facial Sensation: Reduced Velum: Within Functional Limits Mandible: Within Functional Limits   Ice Chips Ice chips: Within functional limits Presentation: Spoon   Thin Liquid Thin Liquid: Impaired Presentation: Spoon;Cup Oral Phase Impairments: Reduced lingual movement/coordination Oral Phase Functional Implications: Prolonged oral transit Pharyngeal  Phase Impairments: Suspected delayed Swallow    Nectar Thick Nectar Thick Liquid: Not tested   Honey Thick Honey Thick Liquid: Not tested   Puree Puree: Impaired Presentation: Spoon Oral Phase Impairments: Reduced lingual movement/coordination Oral Phase Functional Implications: Prolonged oral transit Pharyngeal Phase Impairments: Suspected delayed Swallow   Solid       Solid: Impaired Presentation: Self Fed Oral Phase Impairments: Impaired anterior to posterior transit;Reduced lingual movement/coordination Oral Phase Functional Implications: Oral residue Pharyngeal Phase Impairments: Multiple swallows;Cough - Delayed;Suspected delayed Swallow       Stylianos Stradling,PAT, M.S.,  CCC-SLP 11/01/2014,2:52 PM

## 2014-11-01 NOTE — Progress Notes (Signed)
PT Cancellation Note  Patient Details Name: [name] Short MRN: [id number] DOB: Dec 04, 1965   Cancelled Treatment:    Reason Eval/Treat Not Completed: Medical issues which prohibited therapy.  Patient remains on strict bedrest per orders.  *MD:  Please write activity orders when appropriate for patient.  PT will initiate evaluation at that time.  Thank you.   Vena AustriaDavis, Philomene Haff H 11/01/2014, 3:12 PM Durenda HurtSusan H. Renaldo Fiddleravis, PT, [facility] Acute Rehab Services Pager [phone]

## 2014-11-02 ENCOUNTER — Encounter (HOSPITAL_COMMUNITY): Payer: Self-pay | Admitting: *Deleted

## 2014-11-02 DIAGNOSIS — I509 Heart failure, unspecified: Secondary | ICD-10-CM

## 2014-11-02 DIAGNOSIS — I110 Hypertensive heart disease with heart failure: Secondary | ICD-10-CM

## 2014-11-02 DIAGNOSIS — E785 Hyperlipidemia, unspecified: Secondary | ICD-10-CM

## 2014-11-02 DIAGNOSIS — I639 Cerebral infarction, unspecified: Secondary | ICD-10-CM

## 2014-11-02 DIAGNOSIS — I10 Essential (primary) hypertension: Secondary | ICD-10-CM

## 2014-11-02 DIAGNOSIS — R531 Weakness: Secondary | ICD-10-CM | POA: Diagnosis present

## 2014-11-02 DIAGNOSIS — I63421 Cerebral infarction due to embolism of right anterior cerebral artery: Principal | ICD-10-CM

## 2014-11-02 DIAGNOSIS — I5023 Acute on chronic systolic (congestive) heart failure: Secondary | ICD-10-CM

## 2014-11-02 LAB — CBC
HCT: 39.8 % (ref 39.0–52.0)
HEMOGLOBIN: 12.7 g/dL — AB (ref 13.0–17.0)
MCH: 29.8 pg (ref 26.0–34.0)
MCHC: 31.9 g/dL (ref 30.0–36.0)
MCV: 93.4 fL (ref 78.0–100.0)
Platelets: 172 10*3/uL (ref 150–400)
RBC: 4.26 MIL/uL (ref 4.22–5.81)
RDW: 14.5 % (ref 11.5–15.5)
WBC: 5.3 10*3/uL (ref 4.0–10.5)

## 2014-11-02 LAB — BASIC METABOLIC PANEL
ANION GAP: 5 (ref 5–15)
BUN: 12 mg/dL (ref 6–23)
CALCIUM: 8.3 mg/dL — AB (ref 8.4–10.5)
CO2: 26 mmol/L (ref 19–32)
CREATININE: 1.3 mg/dL (ref 0.50–1.35)
Chloride: 109 mmol/L (ref 96–112)
GFR calc Af Amer: 74 mL/min — ABNORMAL LOW (ref >90)
GFR, EST NON AFRICAN AMERICAN: 64 mL/min — AB (ref >90)
Glucose, Bld: 94 mg/dL (ref 70–99)
Potassium: 3.3 mmol/L — ABNORMAL LOW (ref 3.5–5.1)
Sodium: 140 mmol/L (ref 135–145)

## 2014-11-02 MED ORDER — CLOPIDOGREL BISULFATE 75 MG PO TABS
75.0000 mg | ORAL_TABLET | Freq: Every day | ORAL | Status: DC
  Administered 2014-11-02 – 2014-11-05 (×4): 75 mg via ORAL
  Filled 2014-11-02 (×4): qty 1

## 2014-11-02 MED ORDER — ATORVASTATIN CALCIUM 40 MG PO TABS
40.0000 mg | ORAL_TABLET | Freq: Every day | ORAL | Status: DC
  Administered 2014-11-02 – 2014-11-04 (×3): 40 mg via ORAL
  Filled 2014-11-02: qty 1
  Filled 2014-11-02: qty 4
  Filled 2014-11-02: qty 1

## 2014-11-02 MED ORDER — LISINOPRIL 20 MG PO TABS
20.0000 mg | ORAL_TABLET | Freq: Every day | ORAL | Status: DC
  Administered 2014-11-02: 20 mg via ORAL
  Filled 2014-11-02: qty 1

## 2014-11-02 MED ORDER — CARVEDILOL 3.125 MG PO TABS
3.1250 mg | ORAL_TABLET | Freq: Two times a day (BID) | ORAL | Status: DC
  Administered 2014-11-02: 3.125 mg via ORAL
  Filled 2014-11-02: qty 1

## 2014-11-02 MED ORDER — ASPIRIN EC 81 MG PO TBEC
81.0000 mg | DELAYED_RELEASE_TABLET | Freq: Every day | ORAL | Status: DC
  Administered 2014-11-02 – 2014-11-05 (×4): 81 mg via ORAL
  Filled 2014-11-02 (×4): qty 1

## 2014-11-02 MED ORDER — POTASSIUM CHLORIDE CRYS ER 20 MEQ PO TBCR
40.0000 meq | EXTENDED_RELEASE_TABLET | Freq: Once | ORAL | Status: AC
  Administered 2014-11-02: 40 meq via ORAL
  Filled 2014-11-02: qty 2

## 2014-11-02 NOTE — Progress Notes (Signed)
CARE MANAGEMENT NOTE 11/02/2014  [facility]  [facility]   Account Number:  [id number]  Date Initiated:  11/02/2014  Documentation initiated by:  Jiles CrockerHANDLER,Tabria Steines  Subjective/Objective Assessment:   ADMITTED WITH STROKE     Action/Plan:   CM FOLLOWING FOR DCP   Anticipated DC Date:  11/05/2014   Anticipated DC Plan:  POSSIBLE IP REHAB FACILITY  In-house referral  Financial Counselor      DC Planning Services  CM consult         Status of service:  In process, will continue to follow  Per UR Regulation:  Reviewed for med. necessity/level of care/duration of stay  Comments:  4/11/2016Abelino Derrick- B Kyeshia Zinn RN,BSN,MHA [phone]

## 2014-11-02 NOTE — Evaluation (Signed)
Physical Therapy Evaluation Patient Details Name: [name] Short MRN: [id number] DOB: [birth year] Today's Date: 11/02/2014   History of Present Illness  Pt presents with L sided weakness, s/p R frontal/parietal CVA.  PMH: Uncontrolled HTN, BP parameters <180/105  Clinical Impression  Pt is s/p R parietal/frontal lobe stroke with L sided weakness, resulting in the deficits listed below (see PT Problem List). Pt Independent and working prior to admission, but currently demonstrates grossly 0/5 L UE and LE strength, and requires max assist for all functional mobility. Pt able to sense light touch L LE and UE.  Pt demonstrates L sided neglect, coordination and sequencing impairments.  Activity limited today due to increase in BP to 190/129 while sitting at EOB.  PT will benefit from skilled PT to increase their independence and safety with mobility to allow discharge to the venue listed below.     Follow Up Recommendations CIR    Equipment Recommendations  None recommended by PT    Recommendations for Other Services       Precautions / Restrictions Precautions Precautions: Fall Restrictions Weight Bearing Restrictions: No      Mobility  Bed Mobility Overal bed mobility: Needs Assistance;+2 for physical assistance Bed Mobility: Supine to Sit;Sit to Supine     Supine to sit: Max assist;+2 for safety/equipment Sit to supine: Max assist;+2 for physical assistance   General bed mobility comments: Pt unable to navigate L UE/LE, needs max assist for leg and torso management.  Pt required max assist to maintain EOB balance.  Transfers Overall transfer level: Needs assistance Equipment used: None Transfers: Sit to/from Stand Sit to Stand: +2 physical assistance;Max assist         General transfer comment: Two person assist with gait belt and bed pad; unable to achieve full standing position, but limited by increase in BP  Ambulation/Gait             General Gait Details:  Unable to assess due to BP  Stairs            Wheelchair Mobility    Modified Rankin (Stroke Patients Only) Modified Rankin (Stroke Patients Only) Pre-Morbid Rankin Score: No symptoms Modified Rankin: Severe disability     Balance Overall balance assessment: Needs assistance   Sitting balance-Leahy Scale: Zero Sitting balance - Comments: Put unable to sit without LOB at EOB, needs max assist to maintain seated  balance, needs max assist on L side for EOB balance                                     Pertinent Vitals/Pain Pain Assessment: 0-10 Pain Score: 0-No pain Pain Location: "Discomfort" and pain with urination, currently has catheter Pain Descriptors / Indicators: Discomfort Pain Intervention(s): Monitored during session    Home Living Family/patient expects to be discharged to:: Private residence Living Arrangements: Spouse/significant other Available Help at Discharge: Family Type of Home: Apartment           Additional Comments: Unable to get detailed history    Prior Function Level of Independence: Independent         Comments: 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  Grip 1/5, otherwise grossly 0/5.    Lower Extremity Assessment: LLE deficits/detail   LLE Deficits / Details: Unable to initiate movement L LE, able to feel light touch.  Grossly 0/5.  Cervical / Trunk Assessment: Other exceptions (Unable to manage trunk seated; pushes to L)  Communication   Communication: Expressive difficulties;Receptive difficulties;Other (comment) (Due to lethargy/decreased response time)  Cognition Arousal/Alertness: Lethargic Behavior During Therapy: Flat  affect Overall Cognitive Status: Impaired/Different from baseline Area of Impairment: Attention;Awareness;Safety/judgement;Following commands;Problem solving   Current Attention Level: Sustained Memory:  (Able to recall month, year and job) Following Commands: Follows one step commands with increased time;Follows one step commands inconsistently Safety/Judgement: Decreased awareness of deficits Awareness: Intellectual Problem Solving: Difficulty sequencing;Slow processing;Decreased initiation;Requires verbal cues;Requires tactile cues General Comments: L sided unilateral neglect    General Comments      Exercises        Assessment/Plan    PT Assessment Patient needs continued PT services  PT Diagnosis Hemiplegia non-dominant side   PT Problem List Decreased strength;Decreased range of motion;Decreased activity tolerance;Decreased balance;Decreased mobility;Decreased coordination;Decreased knowledge of use of DME;Decreased cognition;Decreased safety awareness  PT Treatment Interventions DME instruction;Gait training;Functional mobility training;Therapeutic activities;Balance training;Neuromuscular re-education;Patient/family education   PT Goals (Current goals can be found in the Care Plan section) Acute Rehab PT Goals Patient Stated Goal: "I want to stand up" PT Goal Formulation: With patient Time For Goal Achievement: 11/16/14 Potential to Achieve Goals: Fair    Frequency Min 4X/week   Barriers to discharge        Co-evaluation               End of Session Equipment Utilized During Treatment: Gait belt Activity Tolerance:  (Treatment limited by high BP) Patient left: in bed;with call bell/phone within reach;with bed alarm set Nurse Communication: Other (comment) (Blood pressure)         Time: 1610-9604 PT Time Calculation (min) (ACUTE ONLY): 37 min   Charges:         PT G Codes:        [name] Short 11-30-14, 11:27 AM  Vella Raring, SPT (student  physical therapist) Office phone: [phone]

## 2014-11-02 NOTE — Progress Notes (Signed)
STROKE TEAM PROGRESS NOTE   HISTORY [name] Short is an 49 y.o. male history of HTN presenting with acute onset of left sided weakness. Per patient, he awoke normal at 0400 on 10/31/2014 (LKW), went to the bathroom, on the way back he fell due to weakness of his left side. Upon EMS arrival he was noted to have BP of 220/110 and appeared to be slumping to the left. They note his mental status appeared to fluctuate. Family reports poor compliance with his home BP medications. CT head imaging reviewed and no acute process. Due to inconsistent nature of exam he was taken for a DWI which showed acute infarct. Initial NIHSS of 7. tPA was Given. There was a delay as patient had refractory hypertension requiring multiple doses of IV labetalol and a cardene drip. He was admitted for further evaluation and treatment.   SUBJECTIVE (INTERVAL HISTORY) His family is at the bedside. Pt has flat affect and not willing to participate in conversation. but eventually answered simple questions appropriately.    OBJECTIVE Temp:  [98 F (36.7 C)-99.9 F (37.7 C)] 98.7 F (37.1 C) (04/11 1034) Pulse Rate:  [64-88] 75 (04/11 1034) Cardiac Rhythm:  [-] Normal sinus rhythm (04/11 0800) Resp:  [18] 18 (04/11 1034) BP: (153-182)/(90-115) 160/106 mmHg (04/11 1139) SpO2:  [88 %-100 %] 100 % (04/11 1034) Weight:  [93 kg (205 lb 0.4 oz)] 93 kg (205 lb 0.4 oz) (04/10 1942)   Recent Labs Lab 10/31/14 2331 11/01/14 0341 11/01/14 0826 11/01/14 1145 11/01/14 1637  GLUCAP 107* 103* 94 92 93    Recent Labs Lab 10/31/14 0501 10/31/14 0505 10/31/14 1955 11/02/14 0701  NA 136 140  --  140  K 2.9* 2.9* 3.3* 3.3*  CL 105 102  --  109  CO2 23  --   --  26  GLUCOSE 119* 123*  --  94  BUN 16 18  --  12  CREATININE 1.17 1.00  --  1.30  CALCIUM 8.6  --   --  8.3*    Recent Labs Lab 10/31/14 0501  AST 38*  ALT 16  ALKPHOS 69  BILITOT 0.7  PROT 7.7  ALBUMIN 3.2*    Recent Labs Lab 10/31/14 0501  10/31/14 0505 11/02/14 0701  WBC 5.9  --  5.3  NEUTROABS 3.7  --   --   HGB 12.9* 15.0 12.7*  HCT 40.8 44.0 39.8  MCV 94.4  --  93.4  PLT 152  --  172   No results for input(s): CKTOTAL, CKMB, CKMBINDEX, TROPONINI in the last 168 hours.  Recent Labs  10/31/14 0501  LABPROT 16.1*  INR 1.28    Recent Labs  10/31/14 0624  COLORURINE YELLOW  LABSPEC 1.019  PHURINE 6.5  GLUCOSEU NEGATIVE  HGBUR NEGATIVE  BILIRUBINUR NEGATIVE  KETONESUR NEGATIVE  PROTEINUR 30*  UROBILINOGEN 1.0  NITRITE NEGATIVE  LEUKOCYTESUR NEGATIVE       Component Value Date/Time   CHOL 164 11/01/2014 0601   TRIG 72 11/01/2014 0601   HDL 28* 11/01/2014 0601   CHOLHDL 5.9 11/01/2014 0601   VLDL 14 11/01/2014 0601   LDLCALC 122* 11/01/2014 0601   Lab Results  Component Value Date   HGBA1C 5.8* 11/01/2014      Component Value Date/Time   LABOPIA NONE DETECTED 10/31/2014 0624   COCAINSCRNUR NONE DETECTED 10/31/2014 0624   LABBENZ NONE DETECTED 10/31/2014 0624   AMPHETMU NONE DETECTED 10/31/2014 0624   THCU POSITIVE* 10/31/2014 0624   LABBARB NONE DETECTED  10/31/2014 0624     Recent Labs Lab 10/31/14 0500  ETH <5   I have personally reviewed the radiological images below and agree with the radiology interpretations.  Ct Head Wo Contrast 10/31/2014 No acute intracranial abnormalities. Mild chronic small vessel ischemic changes. Old lacunar infarct in the pons.   MRI HEAD 11/01/2014 Acute moderate size nonhemorrhagic right anterior cerebral artery distribution infarct involving the medial aspect of the right frontal and parietal lobe and right aspect of the corpus callosum. Local mass effect. Tiny blood breakdown products cerebellum bilaterally may reflect result of prior hemorrhagic ischemia or prior trauma. Moderate white matter type changes periventricular region advanced for patient's age and most likely related to result of small vessel disease in this hypertensive obese patient.  Pontine altered signal intensity may represent prominent perivascular spaces or result of prior small infarct. Mild mucosal thickening paranasal sinuses most notable inferior right maxillary sinus with maximal thickness of 5 mm.   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 Irregularity of the A2 segment of the anterior cerebral bilaterally with abrupt cut off of flow of the A2 segment of the right anterior cerebral artery 1.5 cm above its origin consistent with patient's acute right anterior cerebral artery distribution infarct. There is a small bulge at the left anterior cerebral artery A1-A2 junction which may represent origin of a small vessel although difficult to completely exclude a a tiny aneurysm. The mild motion degradation somewhat limits evaluation on source images. Middle cerebral artery mild to moderate branch vessel irregularity with narrowing. Posterior fossa atherosclerotic type changes as detailed above. Electronically Signed By: Lacy Duverney M.D. On: 11/01/2014 09:36   Carotid Doppler There is 1-39% bilateral ICA stenosis. Vertebral artery flow is antegrade.   Bilateral lower extremity venous Dopplers There is no DVT or SVT noted in the bilateral lower extremities.   2D Echocardiogram  - Left ventricle: The cavity size was mildly dilated. Wallthickness was increased in a pattern of severe LVH. Systolicfunction was moderately reduced. The estimated ejection fractionwas in the range of 35% to 40%. Diffuse hypokinesis. Dopplerparameters are consistent with elevated ventricular end-diastolicfilling pressure. - Left atrium: The atrium was moderately dilated. - Atrial septum: No defect or patent foramen ovale was identified. - Pulmonary arteries: PA peak pressure: 45 mm Hg (S). Impressions: In the absence of HTN or renal failure cannot  r/oamyloid.   PHYSICAL EXAM  Temp:  [98.6 F (37 C)-101.1 F (38.4 C)] 98.6 F (37 C) (04/11 2120) Pulse Rate:  [61-86] 61 (04/11 2120) Resp:  [18-20] 18 (04/11 2120) BP: (153-182)/(98-115) 178/115 mmHg (04/11 2120) SpO2:  [94 %-100 %] 98 % (04/11 2120)  General - obses, well developed, flat affect, not willing to participate in normal conversation.  Ophthalmologic - not cooperative on exam  Cardiovascular - Regular rate and rhythm with no murmur.  Neuro - pt flat affect, not cooperative on exma, not engaged in normal conversation, eventually answered questions appropriately. Able to repeat and only named 1/3. PERRL, left facial droop, tongue in middle, left UE distal 3/5, proximal 0/5, LLE 0/5. Reflex 1+ and babinski positive on the left. Sensation symmetrical. Coordination intact on the right hand. Gait not tested due to weakness  ASSESSMENT/PLAN Mr. [name] Short is a 49 y.o. male with history of hypertension  presenting with left hemiparesis. He did receive IV t-PA 10/31/2014 at 0646.   Stroke:  Non-dominant right ACA infarct  embolic secondary to unknown source  Resultant  Left hemiparesis  MRI  Right ACA infarct  MRA  Corresponding right tube had all  Carotid Doppler  No significant stenosis  2D Echo  EF 35-40%. Hypokinesis. No obvious embolus.  TEE to look for embolic source. Arranged with [facility] for tomorrow.  (I have made patient NPO after midnight tonight).   If TEE negative, [facility] electrophysiologist will consult and consider placement of an implantable loop recorder to evaluate for atrial fibrillation as etiology of stroke. This has been explained to patient/family by Dr. Roda ShuttersXu and they are agreeable.   HgbA1c 5.8  hypercoagulable work up pending  Lovenox 40 mg sq daily for VTE prophylaxis  DIET DYS 2 Room service appropriate?: Yes; Fluid consistency:: Thin  Diet NPO time specified  no  antithrombotic prior to admission, now on aspirin 325 mg orally every day. Given large vessel intracranial atherosclerosis, changed to aspirin 81 mg and clopidogrel 75 mg orally every day x 3 months for secondary stroke prevention. After 3 months, change to plavix alone. Long-term dual antiplatelets are contraindicated due to risk for intracerebral hemorrhage.   Patient counseled to be compliant with his antithrombotic medications  Ongoing aggressive stroke risk factor management  Therapy recommendations:  CIR. Consult placed.  Disposition:  pending (lives with girlfriend)  Malignant Hypertension  Blood pressure 210/110 on arrival  Home meds:   None  Unstable. Elevated.  Initially required Cardene drip  Started on lisinopril 20 mg daily. May need to increase to 40 tomorrow.  Patient counseled to be compliant with his blood pressure medications  Hyperlipidemia  Home meds:  No statin  LDL 122, goal < 70  Add statin, lipitor 40 daily  Continue statin at discharge  Other Stroke Risk Factors  Cigarette smoker, 1-2/day, advised to stop smoking  UDS positive for THC. Daily use per patient. Advised to stop.  Other Active Problems  Hypokalemia, 2.9->3.3>3.3. Supplement prescribed by cardiology  Cardiomyopathy, name.  Hospital day # 2  Rhoderick MoodyBIBY,SHARON  [facility] See Amion for Pager information 11/02/2014 4:33 PM   I, the attending vascular neurologist, have personally obtained a history, examined the patient, evaluated laboratory data, individually viewed imaging studies and agree with radiology interpretations. Together with the NP/PA, we formulated the assessment and plan of care which reflects our mutual decision.  I have made any additions or clarifications directly to the above note and agree with the findings and plan as currently documented.   49 yo M with uncontrolled HTN presented with right ACA stroke. Although pt has risk factors for stroke but his  stroke pattern can not rule out embolic. His flat affect likely due to frontal lesion. Will do TEE and loop recorder. Await PT/OT and CIR placement.   Marvel PlanJindong Kadesia Robel, MD PhD Stroke Neurology 11/02/2014 11:46 PM       To contact Stroke Continuity provider, please refer to [email or web address]. After hours, contact General Neurology

## 2014-11-02 NOTE — Consult Note (Signed)
ELECTROPHYSIOLOGY CONSULT NOTE   Patient ID: [name] Short MRN: [id number], DOB/AGE: [birth year]   Admit date: 10/31/2014 Date of Consult: 11/02/2014   Primary Physician: No primary care provider on file. Primary Cardiologist: new - seen by G. Ladona Ridgel, MD   Pt. Profile  49 y/o male w/ h/o untreated HTN who was admitted 4/9 with left sided wkns and CVA, whom we've been asked to eval for consideration of TEE and Linq placement.  Problem List  Past Medical History  Diagnosis Date  . Hypertension     a. Previously on meds but none in several years.  . Smoker     a. Previous tobacco - quit ~ 2013. Ongoing daily marijuana usage. Occasional cigar.  . Stroke     a. 10/2014 MRI Head: acute mod size nonhemorrhagic R ant cerebral artery distribution infarct involving the medial aspect of the right frontal and parietal lobe and right aspect of the corpus callosum w/ local mass effect-->TPA;  b. 10/2014 MRA: Abrupt cut off of the R ACA A2 segment;  b. 10/2014 Carotid U/S: 1-39% bilat ICA stenosis.  . Cardiomyopathy     a. 10/2014 Echo: EF 35-40%, diff HK, sev LVH, mod dil LA, PASP .    History reviewed. No pertinent past surgical history.   Allergies  No Known Allergies  HPI   49 y/o male w/o prior cardiac history.  He does have a h/o HTN and at one point was placed on antihypertensive therapy and seen by cardiology in [place], but says that he couldn't afford his meds and has not been on anything for his BP in several years.  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  He does not usually have limitations in his activities.  He rarely drinks alcohol but smokes marijuana almost daily.  He says that on 4/8 he took what he thought was a muscle relaxer but turned out to be a vicodin.  A friend from [place] gave it to him.  He went to bed that night and awoke at roughly 4 am on 4/9  with diaphoresis and dyspnea.  He thought  that Ss might be related to the pill that he had taken.  He stood up to walk to the bathroom and fell to the floor dure to left sided wkns.  He was unable to get up.  EMS was called and he was taken to the [facility] ED.  A Code stroke was activated.  Head CT was neg for bleed.  He was markedly hypertensive with a pressure of 220/110.  He was felt to be a candidate for tPA and once his BP was adequately managed (cardene/labetalol), he was treated with IV tPA.  He was admitted and monitored in the neuro ICU.  MRI/A confirmed an acute R ACA infarct with abrupt cut off of the R ACA A2 segment.  He has continued to have L sided wkns, LE more so than UE.  Echo shows moderate LV dysfxn, EF 35-40% with global HK.  Carotid U/S shows 1-39% bilat ICA stenosis.  We have been asked to eval for TEE and Linq monitor placement.  He is not currently on tele.  Inpatient Medications  . aspirin EC  81 mg Oral Daily  . clopidogrel  75 mg Oral Daily  . enoxaparin (LOVENOX) injection  40 mg Subcutaneous Q24H  . lisinopril  20 mg Oral Daily  . pantoprazole (PROTONIX) IV  40 mg  Intravenous QHS    Family History Family History  Problem Relation Age of Onset  . Other      no premature cad     Social History History   Social History  . Marital Status: N/A    Spouse Name: N/A  . Number of Children: N/A  . Years of Education: N/A   Occupational History  . Not on file.   Social History Main Topics  . Smoking status: Light Tobacco Smoker    Types: Cigars  . Smokeless tobacco: Not on file     Comment: previously smoked cigarettes - quit ~ 2013.  Marland Kitchen Alcohol Use: 0.0 oz/week    0 Standard drinks or equivalent per week     Comment: occasional  . Drug Use: Yes     Comment: Marijuana daily.  Marland Kitchen Sexual Activity: Not on file   Other Topics Concern  . Not on file   Social History Narrative   Lives in [place] with his GF.  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     Review of Systems  General:  No chills, fever,  night sweats or weight changes.  Cardiovascular:  No chest pain, dyspnea on exertion, edema, orthopnea, palpitations, paroxysmal nocturnal dyspnea. Dermatological: No rash, lesions/masses Respiratory: No cough, dyspnea Urologic: No hematuria, dysuria Abdominal:   No nausea, vomiting, diarrhea, bright red blood per rectum, melena, or hematemesis Neurologic:  +++ L sided wkns/dysarthria.  No visual changes, wkns, changes in mental status. All other systems reviewed and are otherwise negative except as noted above.  Physical Exam  Blood pressure 166/127, pulse 75, temperature 98.7 F (37.1 C), temperature source Oral, resp. rate 18, height 5\' 10"  (1.778 m), weight 205 lb 0.4 oz (93 kg), SpO2 100 %.  General: Pleasant, NAD Psych: Normal affect. Neuro: Alert and oriented X 3. Able to wiggle fingers on left hand.  Unable to move L leg.  Word searching/dysarthria present. HEENT: Normal  Neck: Supple without bruits or JVD. Lungs:  Resp regular and unlabored, CTA. Heart: RRR no s3, s4, or murmurs. Abdomen: Soft, non-tender, non-distended, BS + x 4.  Extremities: No clubbing, cyanosis or edema. DP/PT/Radials 2+ and equal bilaterally.  Labs   Lab Results  Component Value Date   WBC 5.3 11/02/2014   HGB 12.7* 11/02/2014   HCT 39.8 11/02/2014   MCV 93.4 11/02/2014   PLT 172 11/02/2014    Recent Labs Lab 10/31/14 0501  11/02/14 0701  NA 136  < > 140  K 2.9*  < > 3.3*  CL 105  < > 109  CO2 23  --  26  BUN 16  < > 12  CREATININE 1.17  < > 1.30  CALCIUM 8.6  --  8.3*  PROT 7.7  --   --   BILITOT 0.7  --   --   ALKPHOS 69  --   --   ALT 16  --   --   AST 38*  --   --   GLUCOSE 119*  < > 94  < > = values in this interval not displayed. Lab Results  Component Value Date   CHOL 164 11/01/2014   HDL 28* 11/01/2014   LDLCALC 122* 11/01/2014   TRIG 72 11/01/2014   Radiology/Studies  Ct Head Wo Contrast  10/31/2014   CLINICAL DATA:  Code stroke.  Slurred speech in extremity  weakness.  EXAM: CT HEAD WITHOUT CONTRAST  IMPRESSION: No acute intracranial abnormalities. Mild chronic small vessel ischemic changes.  Old lacunar infarct in the pons.  These results were called by telephone at the time of interpretation on 10/31/2014 at 5:10 am to Dr. Dione BoozeAVID GLICK , who verbally acknowledged these results.   Electronically Signed   By: Burman NievesWilliam  Stevens M.D.   On: 10/31/2014 05:12   Mr Brain Ltd W/o Cm  10/31/2014   CLINICAL DATA:  Initial evaluation for acute stroke, sudden onset of inability to walk as well as use left arm.  EXAM: MRI HEAD WITHOUT CONTRAST IMPRESSION: Acute ischemic right ACA territory infarct. Please note that this study is limited as only axial diffusion-weighted sequences were performed. No other definite intracranial abnormality.   Electronically Signed   By: Rise MuBenjamin  McClintock M.D.   On: 10/31/2014 06:36   Mr Maxine GlennMra Head/brain Wo Cm  11/01/2014   CLINICAL DATA:  49 year old hypertensive male with acute onset of left-sided weakness. Fell secondary to weakness. Subsequent encounter.  EXAM: MRI HEAD WITHOUT CONTRAST  MRA HEAD WITHOUT CONTRAST   IMPRESSION: This exam was interpreted during a PACS downtime with limited availability of comparison cases.  MRI HEAD  Acute moderate size nonhemorrhagic right anterior cerebral artery distribution infarct involving the medial aspect of the right frontal and parietal lobe and right aspect of the corpus callosum. Local mass effect.  Tiny blood breakdown products cerebellum bilaterally may reflect result of prior hemorrhagic ischemia or prior trauma.  Moderate white matter type changes periventricular region advanced for patient's age and most likely related to result of small vessel disease in this hypertensive obese patient.  Pontine altered signal intensity may represent prominent perivascular spaces or result of prior small infarct.  Mild mucosal thickening paranasal sinuses most notable inferior right maxillary sinus with maximal  thickness of 5 mm.  MRA HEAD  Aplastic A1 segment of the left anterior cerebral artery with A2 segment of the anterior cerebral artery supplied from the right bilaterally. Irregularity of the A2 segment of the anterior cerebral bilaterally with abrupt cut off of flow of the A2 segment of the right anterior cerebral artery 1.5 cm above its origin consistent with patient's acute right anterior cerebral artery distribution infarct.  There is a small bulge at the left anterior cerebral artery A1-A2 junction which may represent origin of a small vessel although difficult to completely exclude a a tiny aneurysm. The mild motion degradation somewhat limits evaluation on source images.  Middle cerebral artery mild to moderate branch vessel irregularity with narrowing.  Posterior fossa atherosclerotic type changes as detailed above.   Electronically Signed   By: Lacy DuverneySteven  Olson M.D.   On: 11/01/2014 09:36   ECG  Sinus arrhythmia, 85, lvh with repol abnormalities.  ASSESSMENT AND PLAN  1.  R ACA Distribution Stroke:  In the setting of marked hypertension.  S/P TPA.  MRI/A shows R ACA stroke with abrupt cut off of A2 segment of R ACA - explaining presentation.  ? Embolus.  Ongoing LLE>LUE wkns and dysarthria.  We have been asked to eval re: TEEt.  There is no documentation of prior h/o afib.  ECG shows sinus rhythm.  He is not currently on tele.  After careful review of history and examination, the risks and benefits of transesophageal echocardiogram have been explained including risks of esophageal damage, perforation (1:10,000 risk), bleeding, pharyngeal hematoma as well as other potential complications associated with conscious sedation including aspiration, arrhythmia, respiratory failure and death. Alternatives to treatment were discussed, questions were answered. Patient is willing to proceed.   Provided that there is no  identifiable LA/LAA thrombus, we will then proceed with outpatient 30 day monitor.  Cont BP  mgmt, asa, plavix per IM/Neuro.  2.  Cardiomyopathy:  Echo performed this admission shows and EF of 35-40% with global HK.  Suspect that this is most likely reflective of a hypertensive cardiomyopathy given presentation with marked HTN and prior h/o untreated HTN.  That said, he will require an ischemic evaluation at some point to ensure that he does not have occult ischemia contributing to LV dysfxn.  Single POC troponin was normal in the ED.  He has no h/o chest pain or DOE prior to admission.  He appears to be euvolemic on exam.  Cont acei.  Add coreg (has been tolerating prn labetalol).  Will need to establish with general cardiology as an outpatient for medical management.  3.  HTN:  Reports a relatively long h/o untreated HTN.  Pressures now 160's to 180's.  Currently on lisinopril and IV labetalol prn.  Will add coreg 3.125 mg bid and titrate to effect.  4.  Marijuana Abuse:  Cessation advised.  5.  Hypokalemia:  supp.  Nicolasa Ducking, NP 11/02/2014 2:44 PM   I have seen, examined the patient, and reviewed the above assessment and plan.  Changes to above are made where necessary.  The patient tells me he is "hungry, ornery, and wants his foley out".  He is minimally interactive with me on discussion and most history is per his spouse. Given fevers, noncompliance, poorly controlled htn, and need for additional outpatient cardiology workup, I do not feel that he is a candidate for ILR implant according to our cryptogenic stroke protocol.  I am not sure that he would comply with outpatient monitoring or anticoagulation should this be required.  I would therefore advise 30 monitor rather than implantable loop recorder. Will need to establish with general cardiology as an outpatient for further evaluation of depressed EF and BP management.  Electrophysiology team to see as needed while here. Please call with questions.  Co Sign: Hillis Range, MD 11/03/2014 10:07 AM

## 2014-11-02 NOTE — Progress Notes (Signed)
Rehab Admissions Coordinator Note:  Patient was screened by Trish MageLogue, Deylan Canterbury M for appropriateness for an Inpatient Acute Rehab Consult.  At this time, we are recommending Inpatient Rehab consult.  Trish MageLogue, Rosario Kushner M 11/02/2014, 12:05 PM  I can be reached at [phone].

## 2014-11-02 NOTE — Progress Notes (Signed)
VASCULAR LAB PRELIMINARY  PRELIMINARY  PRELIMINARY  PRELIMINARY  Bilateral lower extremity venous Dopplers completed.    Preliminary report:  There is no DVT or SVT noted in the bilateral lower extremities.   Kass Herberger, RVT 11/02/2014, 11:05 AM

## 2014-11-02 NOTE — Evaluation (Signed)
Speech Language Pathology Evaluation Patient Details Name: Chasyn Cinque MRN: [id number] DOB: [birth year] Today's Date: 11/02/2014 Time: 1660-6301 SLP Time Calculation (min) (ACUTE ONLY): 36 min  Problem List:  Patient Active Problem List   Diagnosis Date Noted  . Stroke 10/31/2014   Past Medical History:  Past Medical History  Diagnosis Date  . Hypertension   . Smoker    Past Surgical History: History reviewed. No pertinent past surgical history. HPI:  Rajan Burgard is an 49 y.o. male history of HTN presenting with acute onset of left sided weakness. Per patient, he awoke normal at 0400, went to the bathroom, on the way back he fell due to weakness of his left side. Upon EMS arrival he was noted to have BP of 220/110 and appeared to be slumping to the left. They note his mental status appeared to fluctuate. Family reports poor compliance with his home BP medications. Day Deery is an 49 y.o. male history of HTN presenting with acute onset of left sided weakness on 10/31/14; Per patient, he awoke normal at 0400, went to the bathroom, on the way back he fell due to weakness of his left side. Upon EMS arrival he was noted to have BP of 220/110 and appeared to be slumping to the left. They noted his mental status appeared to fluctuate. Family reports poor compliance with his home BP medications. MRI revealed on 10/31/14 Acute moderate size nonhemorrhagic right anterior cerebral artery; local mass effect   Assessment / Plan / Recommendation Clinical Impression  Pt presents with cognitive linguistic deficits impacting pt's response time, judgement, insight, anosognosia, flat affect and attention to left.  Pt required max cues to follow basic directions and his significant other provided verbal stimulation for pt to attend *clapping hands and stating pt's name.   Pt is NOT significantly dysarthric and he was able to name correct home address, job, family members and favorite food items.   His language  appears intact but cognitive deficit impacts his attention to receptiveexpressive communication/language.     Recommend skilled SLP to address needs identified and decrease caregiver burden.  SLP instructed pt and his family to address pt from left side to encourage left attention.      SLP Assessment  Patient needs continued Speech Lanaguage Pathology Services    Follow Up Recommendations  Inpatient Rehab    Frequency and Duration min 2x/week  2 weeks   Pertinent Vitals/Pain Pain Assessment: No/denies pain   SLP Goals  Progression toward goals: Progressing toward goals Patient/Family Stated Goal: to eat Potential Considerations (ACUTE ONLY): Cooperation/participation level;Ability to learn/carryover information  SLP Evaluation Prior Functioning  Cognitive/Linguistic Baseline: Within functional limits Type of Home: Apartment Available Help at Discharge: Family Ike Bene) Education: working at [employer] x approx 11 years    Cognition  Overall Cognitive Status: Impaired/Different from baseline Arousal/Alertness: Awake/alert Orientation Level: Oriented to person;Oriented to place;Oriented to time;Oriented to situation Attention: Sustained;Focused Focused Attention: Appears intact Sustained Attention: Impaired Sustained Attention Impairment: Verbal basic;Functional basic Awareness: Impaired Awareness Impairment: Intellectual impairment (decreased awareness to decreased left attention and delayed responses) Problem Solving: Impaired (pt needed assistance to locate call bell - ? fatigue factor) Problem Solving Impairment: Verbal basic;Functional basic Safety/Judgment: Impaired    Comprehension  Auditory Comprehension Overall Auditory Comprehension: Impaired Yes/No Questions: Not tested Commands: Impaired One Step Basic Commands: 75-100% accurate (inconsistently followed one step commands with repetition, ? fatigue factor and delayed) Conversation: Complex (pt able to  demonstrate smaller bites and slower rate of intake with  total slp supervision) Interfering Components: Attention;Processing speed EffectiveTechniques: Extra processing time;Increased volume Reading Comprehension Reading Status:  (pt read clock and id'd incorrect time)    Expression Expression Primary Mode of Expression: Verbal Verbal Expression Overall Verbal Expression: Appears within functional limits for tasks assessed Level of Generative/Spontaneous Verbalization: Sentence Repetition:  (DNT) Naming: Not tested Pragmatics: No impairment Interfering Components: Attention Other Verbal Expression Comments: pt verbal expression limited to basic sentence  Written Expression Dominant Hand: Right Written Expression: Not tested   Oral / Motor Oral Motor/Sensory Function Overall Oral Motor/Sensory Function: Impaired Labial ROM: Reduced left Labial Symmetry: Abnormal symmetry left Labial Strength: Reduced Labial Sensation: Reduced Lingual ROM: Reduced left Lingual Symmetry: Abnormal symmetry left Lingual Strength: Reduced Lingual Sensation: Reduced Facial ROM: Reduced left Facial Symmetry: Left droop Facial Strength: Reduced Facial Sensation: Reduced Velum: Within Functional Limits Mandible: Within Functional Limits Motor Speech Respiration: Within functional limits Resonance: Within functional limits Articulation: Within functional limitis Intelligibility:  (slightly dysarthric suspect due to mental status) Motor Planning: Witnin functional limits   GO     Mills KollerKimball, Marianela Mandrell Ann Tasheba Henson, MS [facility] SLP [phone]

## 2014-11-02 NOTE — Progress Notes (Signed)
Speech Language Pathology Treatment: Dysphagia  Patient Details Name: [name] Short MRN: [id number] DOB: Oct 31, 1965 Today's Date: 11/02/2014 Time: [id number] SLP Time Calculation (min) (ACUTE ONLY): 17 min  Assessment / Plan / Recommendation Clinical Impression  Pt seen to assess tolerance of po diet and readiness for advancement.  Mastication of solids is functional = although rapid rate of intake noted and pt at increased risk of aspirating/oral pocketing due to left facial sensory deficits. No s/s of aspiration noted with all po observed (cracker, applesauce, water).     With total cues *verbal, visual, tactile* pt able to slow rate of intake, however due to level of impulsivity he will require full supervision.  Family present and report pt with mild improvement in swallowing today compared to yesterday.  Family encouraging pt to follow aspiration precautions as well.  Recommend advance diet to dys3/thin with strict precautions.       HPI HPI: [name] Short is an 49 y.o. male history of HTN presenting with acute onset of left sided weakness. Per patient, he awoke normal at 0400, went to the bathroom, on the way back he fell due to weakness of his left side. Upon EMS arrival he was noted to have BP of 220/110 and appeared to be slumping to the left. They note his mental status appeared to fluctuate. Family reports poor compliance with his home BP medications. [name] Short is an 49 y.o. male history of HTN presenting with acute onset of left sided weakness on 10/31/14; Per patient, he awoke normal at 0400, went to the bathroom, on the way back he fell due to weakness of his left side. Upon EMS arrival he was noted to have BP of 220/110 and appeared to be slumping to the left. They noted his mental status appeared to fluctuate. Family reports poor compliance with his home BP medications. MRI revealed on 10/31/14 Acute moderate size nonhemorrhagic right anterior cerebral artery; local mass effect    Pertinent Vitals Pain Assessment: No/denies pain Pain Location: pain with urination, iv pain, RN made aware Pain Descriptors / Indicators: Discomfort Pain Intervention(s): Monitored during session  SLP Plan  Continue with current plan of care    Recommendations Diet recommendations: Dysphagia 3 (mechanical soft);Thin liquid Liquids provided via: Cup;Straw Medication Administration: Whole meds with puree (whole) Supervision: Full supervision/cueing for compensatory strategies Compensations: Slow rate;Small sips/bites;Check for pocketing (consume liquids t/o meals) Postural Changes and/or Swallow Maneuvers: Seated upright 90 degrees;Upright 30-60 min after meal              Oral Care Recommendations: Oral care BID Follow up Recommendations: Inpatient Rehab Plan: Continue with current plan of care    GO    Donavan Burnetamara Marlet Korte, MS [facility] SLP [phone]

## 2014-11-02 NOTE — Evaluation (Signed)
Occupational Therapy Evaluation Patient Details Name: [name] Short MRN: [id number] DOB: Mar 04, 1966 Today's Date: 11/02/2014    History of Present Illness Pt presents with L sided weakness, s/p R frontal/parietal CVA.  PMH: Uncontrolled HTN, BP parameters <180/105   Clinical Impression   PT admitted with R frontal/parietal CVA. Pt currently with functional limitiations due to the deficits listed below (see OT problem list). Pt demonstrates L inattention and strong R lean with static sitting. Pt with L UE flexion synergy pattern.  Pt will benefit from skilled OT to increase their independence and safety with adls and balance to allow discharge CIR.     Follow Up Recommendations  CIR    Equipment Recommendations  Other (comment) (TBA)    Recommendations for Other Services Rehab consult     Precautions / Restrictions Precautions Precautions: Fall Restrictions Weight Bearing Restrictions: No      Mobility Bed Mobility Overal bed mobility: +2 for physical assistance;Needs Assistance;+ 2 for safety/equipment Bed Mobility: Supine to Sit;Sit to Supine     Supine to sit: Max assist;+2 for physical assistance;+2 for safety/equipment Sit to supine: +2 for safety/equipment;+2 for physical assistance;Max assist   General bed mobility comments: cues for sequence and L UE / LE positioning  Transfers                      Balance Overall balance assessment: Needs assistance Sitting-balance support: Single extremity supported;Feet supported Sitting balance-Leahy Scale: Zero   Postural control: Right lateral lean                                  ADL Overall ADL's : Needs assistance/impaired Eating/Feeding: Minimal assistance;Sitting Eating/Feeding Details (indicate cue type and reason): cues for swallowing precautions Grooming: Moderate assistance;Sitting   Upper Body Bathing: Moderate assistance;Bed level   Lower Body Bathing: Total assistance                         General ADL Comments: pt completed supine to sit eob with incr BP and returned to supine. pt sitting eob to work on trunk stability. WIfe present in room clapping at patient for arousal and calling name to the L. Spouse turning patients head with hands physically during session and asking if a neck pillow would prevent the R side neck rotation. Family advised to allow patient to rotate neck and a neck pillow is not advised at this time     Vision Vision Assessment?: Vision impaired- to be further tested in functional context Additional Comments: to be further assessed next session. Pt with strong R eye preference   Perception     Praxis      Pertinent Vitals/Pain Pain Assessment: Faces Faces Pain Scale: Hurts worst Pain Location: R hand IV site Pain Descriptors / Indicators: Discomfort Pain Intervention(s): Monitored during session;Premedicated before session;Repositioned (RN in room to address)     Hand Dominance Right   Extremity/Trunk Assessment Upper Extremity Assessment Upper Extremity Assessment: LUE deficits/detail LUE Deficits / Details: flexion pattern with movement ( elbow flexion, wrist flexion, digit flexion) pt not moving L UE outside of flexion pattern this session LUE Sensation: decreased light touch;decreased proprioception LUE Coordination: decreased fine motor;decreased gross motor   Lower Extremity Assessment Lower Extremity Assessment: Defer to PT evaluation LLE Deficits / Details: with static sitting pt with extensor tone ( knee extended and ankle plantar flexion)   Cervical /  Trunk Assessment Cervical / Trunk Assessment: Other exceptions (neck flexion to the right )   Communication Communication Communication: Expressive difficulties;Receptive difficulties;Other (comment)   Cognition Arousal/Alertness: Lethargic Behavior During Therapy: Flat affect Overall Cognitive Status: Impaired/Different from baseline Area of Impairment:  Attention;Awareness;Safety/judgement;Following commands;Problem solving   Current Attention Level: Sustained Memory: Decreased recall of precautions;Decreased short-term memory Following Commands: Follows one step commands with increased time;Follows one step commands inconsistently Safety/Judgement: Decreased awareness of deficits;Decreased awareness of safety Awareness: Intellectual Problem Solving: Difficulty sequencing;Slow processing;Decreased initiation;Requires verbal cues;Requires tactile cues General Comments: L side neglect   General Comments       Exercises       Shoulder Instructions      Home Living Family/patient expects to be discharged to:: Private residence Living Arrangements: Spouse/significant other Available Help at Discharge: Family Type of Home: Apartment                                  Prior Functioning/Environment Level of Independence: Independent        Comments: 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 [employer]    OT Diagnosis: Generalized weakness;Disturbance of vision;Cognitive deficits;Acute pain   OT Problem List: Decreased strength;Decreased activity tolerance;Decreased range of motion;Impaired balance (sitting and/or standing);Impaired vision/perception;Decreased coordination;Decreased cognition;Decreased safety awareness;Decreased knowledge of use of DME or AE;Decreased knowledge of precautions;Obesity;Impaired UE functional use;Pain   OT Treatment/Interventions: Self-care/ADL training;Therapeutic exercise;Neuromuscular education;DME and/or AE instruction;Therapeutic activities;Cognitive remediation/compensation;Visual/perceptual remediation/compensation;Patient/family education;Balance training    OT Goals(Current goals can be found in the care plan section) Acute Rehab OT Goals Patient Stated Goal: "I want to stand up" OT Goal Formulation: With patient/family Time For Goal Achievement: 11/16/14 Potential to Achieve Goals: Good  OT  Frequency: Min 2X/week   Barriers to D/C:            Co-evaluation              End of Session Nurse Communication: Mobility status;Precautions  Activity Tolerance: Treatment limited secondary to medical complications (Comment) (BP increase) Patient left: in bed;with call bell/phone within reach;with family/visitor present   Time: 1139-1212 OT Time Calculation (min): 33 min Charges:  OT General Charges $OT Visit: 1 Procedure OT Evaluation $Initial OT Evaluation Tier I: 1 Procedure OT Treatments $Therapeutic Activity: 8-22 mins G-Codes:    Boone Master B November 12, 2014, 2:14 PM Pager: [phone]

## 2014-11-03 ENCOUNTER — Encounter (HOSPITAL_COMMUNITY): Payer: Self-pay

## 2014-11-03 ENCOUNTER — Other Ambulatory Visit: Payer: Self-pay | Admitting: Nurse Practitioner

## 2014-11-03 ENCOUNTER — Encounter (HOSPITAL_COMMUNITY)
Admission: EM | Disposition: A | Discharge status: Rehabilitation Facility | Payer: Self-pay | Source: Home / Self Care | Attending: Neurology

## 2014-11-03 DIAGNOSIS — I351 Nonrheumatic aortic (valve) insufficiency: Secondary | ICD-10-CM

## 2014-11-03 DIAGNOSIS — I639 Cerebral infarction, unspecified: Secondary | ICD-10-CM

## 2014-11-03 DIAGNOSIS — R69 Illness, unspecified: Secondary | ICD-10-CM

## 2014-11-03 DIAGNOSIS — I63321 Cerebral infarction due to thrombosis of right anterior cerebral artery: Secondary | ICD-10-CM

## 2014-11-03 HISTORY — PX: TEE WITHOUT CARDIOVERSION: SHX5443

## 2014-11-03 LAB — BASIC METABOLIC PANEL
Anion gap: 8 (ref 5–15)
BUN: 11 mg/dL (ref 6–23)
CHLORIDE: 105 mmol/L (ref 96–112)
CO2: 24 mmol/L (ref 19–32)
CREATININE: 1.19 mg/dL (ref 0.50–1.35)
Calcium: 8.6 mg/dL (ref 8.4–10.5)
GFR calc non Af Amer: 71 mL/min — ABNORMAL LOW (ref >90)
GFR, EST AFRICAN AMERICAN: 83 mL/min — AB (ref >90)
GLUCOSE: 87 mg/dL (ref 70–99)
Potassium: 3.4 mmol/L — ABNORMAL LOW (ref 3.5–5.1)
Sodium: 137 mmol/L (ref 135–145)

## 2014-11-03 LAB — CBC
HCT: 40.9 % (ref 39.0–52.0)
Hemoglobin: 13.3 g/dL (ref 13.0–17.0)
MCH: 29.9 pg (ref 26.0–34.0)
MCHC: 32.5 g/dL (ref 30.0–36.0)
MCV: 91.9 fL (ref 78.0–100.0)
PLATELETS: 168 10*3/uL (ref 150–400)
RBC: 4.45 MIL/uL (ref 4.22–5.81)
RDW: 14.3 % (ref 11.5–15.5)
WBC: 5.1 10*3/uL (ref 4.0–10.5)

## 2014-11-03 SURGERY — LOOP RECORDER IMPLANT
Anesthesia: LOCAL

## 2014-11-03 SURGERY — ECHOCARDIOGRAM, TRANSESOPHAGEAL
Anesthesia: Moderate Sedation

## 2014-11-03 MED ORDER — FENTANYL CITRATE 0.05 MG/ML IJ SOLN
INTRAMUSCULAR | Status: DC | PRN
  Administered 2014-11-03 (×2): 25 ug via INTRAVENOUS

## 2014-11-03 MED ORDER — AMLODIPINE BESYLATE 10 MG PO TABS
10.0000 mg | ORAL_TABLET | Freq: Every day | ORAL | Status: DC
  Administered 2014-11-03 – 2014-11-05 (×3): 10 mg via ORAL
  Filled 2014-11-03 (×3): qty 1

## 2014-11-03 MED ORDER — LISINOPRIL 20 MG PO TABS
40.0000 mg | ORAL_TABLET | Freq: Every day | ORAL | Status: DC
  Administered 2014-11-04 – 2014-11-05 (×2): 40 mg via ORAL
  Filled 2014-11-03 (×2): qty 2

## 2014-11-03 MED ORDER — BUTAMBEN-TETRACAINE-BENZOCAINE 2-2-14 % EX AERO
INHALATION_SPRAY | CUTANEOUS | Status: DC | PRN
  Administered 2014-11-03: 2 via TOPICAL

## 2014-11-03 MED ORDER — CARVEDILOL 6.25 MG PO TABS
6.2500 mg | ORAL_TABLET | Freq: Two times a day (BID) | ORAL | Status: DC
  Administered 2014-11-03 – 2014-11-04 (×2): 6.25 mg via ORAL
  Filled 2014-11-03 (×2): qty 1

## 2014-11-03 MED ORDER — HYDRALAZINE HCL 20 MG/ML IJ SOLN
10.0000 mg | Freq: Once | INTRAMUSCULAR | Status: AC
  Administered 2014-11-03: 10 mg via INTRAVENOUS
  Filled 2014-11-03: qty 1

## 2014-11-03 MED ORDER — POTASSIUM CHLORIDE CRYS ER 20 MEQ PO TBCR
40.0000 meq | EXTENDED_RELEASE_TABLET | ORAL | Status: AC
  Administered 2014-11-03 (×2): 40 meq via ORAL
  Filled 2014-11-03: qty 2

## 2014-11-03 MED ORDER — LABETALOL HCL 5 MG/ML IV SOLN
INTRAVENOUS | Status: AC
  Filled 2014-11-03: qty 4

## 2014-11-03 MED ORDER — MIDAZOLAM HCL 5 MG/ML IJ SOLN
INTRAMUSCULAR | Status: AC
  Filled 2014-11-03: qty 2

## 2014-11-03 MED ORDER — MIDAZOLAM HCL 10 MG/2ML IJ SOLN
INTRAMUSCULAR | Status: DC | PRN
  Administered 2014-11-03 (×2): 2 mg via INTRAVENOUS

## 2014-11-03 MED ORDER — FENTANYL CITRATE 0.05 MG/ML IJ SOLN
INTRAMUSCULAR | Status: AC
  Filled 2014-11-03: qty 2

## 2014-11-03 MED ORDER — SODIUM CHLORIDE 0.9 % IV SOLN
INTRAVENOUS | Status: DC
  Administered 2014-11-03: 500 mL via INTRAVENOUS

## 2014-11-03 MED ORDER — DIPHENHYDRAMINE HCL 50 MG/ML IJ SOLN
INTRAMUSCULAR | Status: AC
  Filled 2014-11-03: qty 1

## 2014-11-03 NOTE — Progress Notes (Signed)
Paged Dr Roseanne RenoStewart about patient dystolec BP not responding to Labetalol and he gave a one time phone order for 10 mg of Hydralazine. Will check patients BP again in 30 min.

## 2014-11-03 NOTE — Progress Notes (Signed)
  Echocardiogram Echocardiogram Transesophageal has been performed.  Aris EvertsRix, Nichole Keltner A 11/03/2014, 3:28 PM

## 2014-11-03 NOTE — Progress Notes (Signed)
Rehab admissions - Evaluated for possible admit.  I met briefly with patient.  I will need to get in touch with family.  Patient not responding verbally to my questions at this time.  I will follow up again tomorrow.  Call me for questions.  #[phone]

## 2014-11-03 NOTE — CV Procedure (Signed)
Procedure: TEE  Indication: CVA  Sedation: Versed 4 mg IV, Fentanyl 50 mcg IV.  Patient received labetalol 20 mg IV for hypertension.   Findings: Please see echo section for full report.  Normal left ventricular size with moderate LV hypertrophy.  Diffuse hypokinesis with EF 35%.  Normal RV size with mildly decreased systolic function.  Trivial mitral regurgitation.  Trivial tricuspid regurgitation.  Trileaflet aortic valve with no stenosis and mild regurgitation.  Mildly dilated left atrium, no LAA thrombus but there was smoke in the appendage.  Normal caliber aorta with mild plaque.  Negative bubble study, no ASD or PFO.   No definite source of embolus.  EF 35% with moderate LVH and smoke but no thrombus in LA appendage.   Marca AnconaDalton Taelor Moncada 11/03/2014  3:04 PM

## 2014-11-03 NOTE — Progress Notes (Signed)
Physical Therapy Treatment Patient Details Name: [name] Short MRN: [id number] DOB: 1966-01-11 Today's Date: 11/03/2014    History of Present Illness Pt presents with L sided weakness, s/p R frontal/parietal CVA.  PMH: Uncontrolled HTN, BP parameters <180/105    PT Comments    Pt with improved sitting EOB balance but con't to have L sided hemiplegia, delayed processing, motor planning, and impaired balance. Pt BP in sitting 179/107. Pt con't to be appropriate for CIR upon d/c for maximal functional recovery to decrease burden of care on significant other and daughter.   Follow Up Recommendations  CIR     Equipment Recommendations  None recommended by PT    Recommendations for Other Services       Precautions / Restrictions Precautions Precautions: Fall Restrictions Weight Bearing Restrictions: No    Mobility  Bed Mobility Overal bed mobility: +2 for physical assistance;Needs Assistance Bed Mobility: Rolling;Supine to Sit;Sit to Supine Rolling: Mod assist;+2 for physical assistance;+2 for safety/equipment   Supine to sit: +2 for physical assistance;Max assist;+2 for safety/equipment Sit to supine: Mod assist;+2 for physical assistance;+2 for safety/equipment   General bed mobility comments: unable to use L UE functionally, assist for L UE/LE for rolling for hygiene s/p bowel mvmt after standing  Transfers Overall transfer level: Needs assistance Equipment used: None Transfers: Sit to/from Stand Sit to Stand: +2 physical assistance;Max assist         General transfer comment: 2 person lift with gait belt and bed pad. able to achieve full standing with L LE blocked and bed pad used to achieve full hip extension for upright posture and tactile cues at chest   Ambulation/Gait                 Stairs            Wheelchair Mobility    Modified Rankin (Stroke Patients Only) Modified Rankin (Stroke Patients Only) Pre-Morbid Rankin Score: No  symptoms Modified Rankin: Severe disability     Balance Overall balance assessment: Needs assistance Sitting-balance support: Single extremity supported Sitting balance-Leahy Scale: Poor Sitting balance - Comments: pt able to sit with hands in lap x 30 seconds x 3 trials. pt impulsively placing R UE out to hold self up.  Postural control: Right lateral lean                          Cognition Arousal/Alertness: Awake/alert Behavior During Therapy: Flat affect Overall Cognitive Status: Impaired/Different from baseline Area of Impairment: Attention;Awareness;Safety/judgement;Following commands;Problem solving   Current Attention Level: Sustained Memory: Decreased recall of precautions;Decreased short-term memory Following Commands: Follows one step commands with increased time;Follows one step commands inconsistently Safety/Judgement: Decreased awareness of deficits;Decreased awareness of safety Awareness: Intellectual Problem Solving: Difficulty sequencing;Slow processing;Decreased initiation;Requires verbal cues;Requires tactile cues General Comments: L sided neglect however more attentive this date    Exercises      General Comments        Pertinent Vitals/Pain Pain Assessment: Faces Pain Score: 0-No pain Faces Pain Scale: Hurts little more Pain Location: catheter site Pain Descriptors / Indicators: Discomfort Pain Intervention(s): Monitored during session    Home Living                      Prior Function            PT Goals (current goals can now be found in the care plan section) Acute Rehab PT Goals Patient Stated Goal: "I want to  stand up" Progress towards PT goals: Progressing toward goals    Frequency  Min 4X/week    PT Plan Current plan remains appropriate    Co-evaluation             End of Session Equipment Utilized During Treatment: Gait belt Activity Tolerance: Patient tolerated treatment well Patient left: in bed;with  call bell/phone within reach;with bed alarm set;with family/visitor present     Time: [id number] PT Time Calculation (min) (ACUTE ONLY): 31 min  Charges:  $Therapeutic Activity: 8-22 mins $Neuromuscular Re-education: 8-22 mins                    G Codes:      Marcene BrawnChadwell, Tia Hieronymus Marie 11/03/2014, 12:55 PM  Lewis ShockAshly Brynlee Pennywell, PT, DPT Pager #: [phone] Office #: [phone]

## 2014-11-03 NOTE — Progress Notes (Signed)
Occupational Therapy Treatment Patient Details Name: [name] Short Sheppard MRN: [id number] DOB: November 01, 1965 Today's Date: 11/03/2014    History of present illness Pt presents with L sided weakness, s/p R frontal/parietal CVA.  PMH: Uncontrolled HTN, BP parameters <180/105   OT comments  Orthostatic BPs  Supine 182/93  Sitting 162/102  Sitting after 3 min 158/102   Pt demonstrates neck rotation to L side with simple command. Pt very alert at present and asking "where Rubie MaidMary Kay went?" Pt recalling rehab tech introducing herself and dropping off linens prior to therapy arrival for session. Pt states "its a nurse" when asked to describe the person. Pt demonstrates BP changes but reports no dizziness or HA. Pt static sitting with incr trunk support however remains with deficits and requires Max (A)- mod (A).    Follow Up Recommendations  CIR    Equipment Recommendations  Other (comment)    Recommendations for Other Services Rehab consult    Precautions / Restrictions Precautions Precautions: Fall       Mobility Bed Mobility Overal bed mobility: +2 for physical assistance;Needs Assistance Bed Mobility: Rolling;Supine to Sit;Sit to Supine Rolling: Mod assist;+2 for physical assistance;+2 for safety/equipment   Supine to sit: +2 for physical assistance;Max assist;+2 for safety/equipment Sit to supine: Mod assist;+2 for physical assistance;+2 for safety/equipment   General bed mobility comments: pt requires max v/c and increased time to initiate. Pt very slow to respond at times to commands  Transfers                      Balance Overall balance assessment: Needs assistance Sitting-balance support: Single extremity supported;Feet supported Sitting balance-Leahy Scale: Poor   Postural control: Right lateral lean                         ADL Overall ADL's : Needs assistance/impaired     Grooming: Wash/dry face;Minimal assistance;Sitting Grooming Details (indicate  cue type and reason): (A) for balance sitting supported eob with Max (A)                               General ADL Comments: Pt required (A) to complete bed mobility and maintain eob sitting. pt completed weight shifting in sitting onto BIL UE . Pt able to tolerate well initially . pt provided verbal cue that standing was going to occurr. Pt demonstrates immediate L UE flexion synergy and L LE extension. Transfer did not occurr this session due to DME not present and extensor tone unsafe to attempt with +2 person (A)      Vision                     Perception     Praxis      Cognition   Behavior During Therapy: Flat affect Overall Cognitive Status: Impaired/Different from baseline Area of Impairment: Attention;Awareness;Safety/judgement;Following commands;Problem solving   Current Attention Level: Sustained Memory: Decreased recall of precautions;Decreased short-term memory  Following Commands: Follows one step commands with increased time;Follows one step commands inconsistently Safety/Judgement: Decreased awareness of deficits;Decreased awareness of safety Awareness: Intellectual Problem Solving: Difficulty sequencing;Slow processing;Decreased initiation;Requires verbal cues;Requires tactile cues General Comments: L side neglect, pt with better reaction time to commands but remains with a 10-20 second delay.    Extremity/Trunk Assessment               Exercises     Shoulder Instructions  General Comments      Pertinent Vitals/ Pain       Pain Assessment: Faces Faces Pain Scale: Hurts little more Pain Location: R UE Pain Descriptors / Indicators: Discomfort Pain Intervention(s): Monitored during session  Home Living                                          Prior Functioning/Environment              Frequency Min 2X/week     Progress Toward Goals  OT Goals(current goals can now be found in the care plan  section)  Progress towards OT goals: Progressing toward goals  Acute Rehab OT Goals Patient Stated Goal: "I want to stand up" OT Goal Formulation: With patient/family Time For Goal Achievement: 11/16/14 Potential to Achieve Goals: Good ADL Goals Pt Will Perform Grooming: with min guard assist;sitting Pt Will Perform Upper Body Bathing: with min assist;sitting Pt Will Transfer to Toilet: with +2 assist;with mod assist;bedside commode Additional ADL Goal #1: Pt will complete bed moblity Max (A)  Additional ADL Goal #2: Pt will demonstrates L UE HEP with min (A)  Plan Discharge plan remains appropriate    Co-evaluation                 End of Session     Activity Tolerance Patient tolerated treatment well   Patient Left in bed;with call bell/phone within reach   Nurse Communication Mobility status;Precautions;Need for lift equipment        Time: [phone] OT Time Calculation (min): 23 min  Charges: OT General Charges $OT Visit: 1 Procedure OT Treatments $Therapeutic Activity: 23-37 mins  Harolyn Rutherford 11/03/2014, 12:23 PM  Pager: [phone]

## 2014-11-03 NOTE — Interval H&P Note (Signed)
History and Physical Interval Note:  11/03/2014 2:35 PM  [name] Short  has presented today for surgery, with the diagnosis of STROKE  The various methods of treatment have been discussed with the patient and family. After consideration of risks, benefits and other options for treatment, the patient has consented to  Procedure(s): TRANSESOPHAGEAL ECHOCARDIOGRAM (TEE) (N/A) as a surgical intervention .  The patient's history has been reviewed, patient examined, no change in status, stable for surgery.  I have reviewed the patient's chart and labs.  Questions were answered to the patient's satisfaction.     Yitzel Shasteen [employer]

## 2014-11-03 NOTE — Progress Notes (Addendum)
STROKE TEAM PROGRESS NOTE   HISTORY [name] Short is an 49 y.o. male history of HTN presenting with acute onset of left sided weakness. Per patient, he awoke normal at 0400 on 10/31/2014 (LKW), went to the bathroom, on the way back he fell due to weakness of his left side. Upon EMS arrival he was noted to have BP of 220/110 and appeared to be slumping to the left. They note his mental status appeared to fluctuate. Family reports poor compliance with his home BP medications. CT head imaging reviewed and no acute process. Due to inconsistent nature of exam he was taken for a DWI which showed acute infarct. Initial NIHSS of 7. tPA was Given. There was a delay as patient had refractory hypertension requiring multiple doses of IV labetalol and a cardene drip. He was admitted for further evaluation and treatment.   SUBJECTIVE (INTERVAL HISTORY) No family at bedside. Pt NPO for TEE today. Still has flat affect, not cooperating on exam, and not cooperating with CIR coordinator.   OBJECTIVE Temp:  [97.9 F (36.6 C)-101.1 F (38.4 C)] 98.6 F (37 C) (04/12 1012) Pulse Rate:  [61-85] 68 (04/12 1012) Cardiac Rhythm:  [-] Normal sinus rhythm (04/11 2000) Resp:  [16-20] 20 (04/12 1012) BP: (157-178)/(89-115) 157/89 mmHg (04/12 1012) SpO2:  [97 %-100 %] 100 % (04/12 1012)   Recent Labs Lab 10/31/14 2331 11/01/14 0341 11/01/14 0826 11/01/14 1145 11/01/14 1637  GLUCAP 107* 103* 94 92 93    Recent Labs Lab 10/31/14 0501 10/31/14 0505 10/31/14 1955 11/02/14 0701 11/03/14 0711  NA 136 140  --  140 137  K 2.9* 2.9* 3.3* 3.3* 3.4*  CL 105 102  --  109 105  CO2 23  --   --  26 24  GLUCOSE 119* 123*  --  94 87  BUN 16 18  --  12 11  CREATININE 1.17 1.00  --  1.30 1.19  CALCIUM 8.6  --   --  8.3* 8.6    Recent Labs Lab 10/31/14 0501  AST 38*  ALT 16  ALKPHOS 69  BILITOT 0.7  PROT 7.7  ALBUMIN 3.2*    Recent Labs Lab 10/31/14 0501 10/31/14 0505 11/02/14 0701 11/03/14 0711  WBC  5.9  --  5.3 5.1  NEUTROABS 3.7  --   --   --   HGB 12.9* 15.0 12.7* 13.3  HCT 40.8 44.0 39.8 40.9  MCV 94.4  --  93.4 91.9  PLT 152  --  172 168   No results for input(s): CKTOTAL, CKMB, CKMBINDEX, TROPONINI in the last 168 hours. No results for input(s): LABPROT, INR in the last 72 hours. No results for input(s): COLORURINE, LABSPEC, PHURINE, GLUCOSEU, HGBUR, BILIRUBINUR, KETONESUR, PROTEINUR, UROBILINOGEN, NITRITE, LEUKOCYTESUR in the last 72 hours.  Invalid input(s): APPERANCEUR     Component Value Date/Time   CHOL 164 11/01/2014 0601   TRIG 72 11/01/2014 0601   HDL 28* 11/01/2014 0601   CHOLHDL 5.9 11/01/2014 0601   VLDL 14 11/01/2014 0601   LDLCALC 122* 11/01/2014 0601   Lab Results  Component Value Date   HGBA1C 5.8* 11/01/2014      Component Value Date/Time   LABOPIA NONE DETECTED 10/31/2014 0624   COCAINSCRNUR NONE DETECTED 10/31/2014 0624   LABBENZ NONE DETECTED 10/31/2014 0624   AMPHETMU NONE DETECTED 10/31/2014 0624   THCU POSITIVE* 10/31/2014 0624   LABBARB NONE DETECTED 10/31/2014 0624     Recent Labs Lab 10/31/14 0500  ETH <5   Ct  Head Wo Contrast 10/31/2014 No acute intracranial abnormalities. Mild chronic small vessel ischemic changes. Old lacunar infarct in the pons.   MRI HEAD 11/01/2014 Acute moderate size nonhemorrhagic right anterior cerebral artery distribution infarct involving the medial aspect of the right frontal and parietal lobe and right aspect of the corpus callosum. Local mass effect. Tiny blood breakdown products cerebellum bilaterally may reflect result of prior hemorrhagic ischemia or prior trauma. Moderate white matter type changes periventricular region advanced for patient's age and most likely related to result of small vessel disease in this hypertensive obese patient. Pontine altered signal intensity may represent prominent perivascular spaces or result of prior small infarct. Mild mucosal thickening paranasal sinuses most  notable inferior right maxillary sinus with maximal thickness of 5 mm.   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 Irregularity of the A2 segment of the anterior cerebral bilaterally with abrupt cut off of flow of the A2 segment of the right anterior cerebral artery 1.5 cm above its origin consistent with patient's acute right anterior cerebral artery distribution infarct. There is a small bulge at the left anterior cerebral artery A1-A2 junction which may represent origin of a small vessel although difficult to completely exclude a a tiny aneurysm. The mild motion degradation somewhat limits evaluation on source images. Middle cerebral artery mild to moderate branch vessel irregularity with narrowing. Posterior fossa atherosclerotic type changes as detailed above. Electronically Signed By: Lacy Duverney M.D. On: 11/01/2014 09:36   Carotid Doppler There is 1-39% bilateral ICA stenosis. Vertebral artery flow is antegrade.   Bilateral lower extremity venous Dopplers There is no DVT or SVT noted in the bilateral lower extremities.   2D Echocardiogram  - Left ventricle: The cavity size was mildly dilated. Wallthickness was increased in a pattern of severe LVH. Systolicfunction was moderately reduced. The estimated ejection fractionwas in the range of 35% to 40%. Diffuse hypokinesis. Dopplerparameters are consistent with elevated ventricular end-diastolicfilling pressure. - Left atrium: The atrium was moderately dilated. - Atrial septum: No defect or patent foramen ovale was identified. - Pulmonary arteries: PA peak pressure: 45 mm Hg (S). Impressions: In the absence of HTN or renal failure cannot r/oamyloid.  TEE - pending  Renal artery duplex - pending   PHYSICAL EXAM  Temp:  [97.9 F (36.6 C)-101.1 F (38.4 C)] 98.6 F (37 C) (04/12 1012) Pulse Rate:   [61-85] 68 (04/12 1012) Resp:  [16-20] 20 (04/12 1012) BP: (157-178)/(89-115) 157/89 mmHg (04/12 1012) SpO2:  [97 %-100 %] 100 % (04/12 1012)  General - obses, well developed, flat affect, abulia, not willing to participate in normal conversation.  Ophthalmologic - not cooperative on exam  Cardiovascular - Regular rate and rhythm with no murmur.  Neuro - pt flat affect, abulia, not cooperative on exma, not engaged in normal conversation, eventually answered some questions appropriately. Able to repeat and only named 1/3. PERRL, left facial droop, tongue in middle, left UE distal 3/5, proximal 0/5, LLE 0/5. Reflex 1+ and babinski positive on the left. Sensation symmetrical. Coordination intact on the right hand. Gait not tested due to weakness.  ASSESSMENT/PLAN Mr. [name] Short is a 49 y.o. male with history of hypertension  presenting with left hemiparesis. He did receive IV t-PA 10/31/2014 at 0646.   Stroke:  Non-dominant right ACA infarct embolic secondary to unknown source  Resultant  Left hemiparesis, abulia   MRI  Right ACA infarct  MRA  right A2 occlusion  Carotid Doppler  No significant stenosis  2D Echo  EF 35-40%. Hypokinesis. No obvious embolus.  TEE pending to look for embolic source.   Does not recommend implantable loop recorder due to past noncompliance and concern for noncompliance with monitoring and anticoagulation if needed. OP 30 day monitor recommended. Also OP cardiology consult recommended for low EF.  HgbA1c 5.8  hypercoagulable work up pending  Lovenox 40 mg sq daily for VTE prophylaxis Diet NPO time specified  no antithrombotic prior to admission, now on dural antiplatelet for 3 months and then Plavix alone.  Patient counseled to be compliant with his antithrombotic medications  Ongoing aggressive stroke risk factor management  Therapy recommendations:  CIR.   Disposition:  Pending. Rehab admissions coordinator following (lives with  girlfriend)  Cardiomyopathy  Low EF 35-40%  Likely due to long-standing and controlled hypertension  Cardiology recommend outpatient cardiology consult.  Malignant Hypertension  Blood pressure 210/110 on arrival  Home meds:  None  Uncontrolled.  Blood pressure goal - gradually normotensive  Increase lisinopril to 40, add amlodipine 10, increase Coreg to 6.25 twice a day  BP 178/115 during the night. Received total 50 mg labetalol past 24h and hydralazine 10.   Patient counseled to be compliant with his blood pressure medications  Check renal artery duplex and malignant HTN labs.  Hyperlipidemia  Home meds:  No statin  LDL 122, goal < 70  Add statin, lipitor 40 daily  Continue statin at discharge  Other Stroke Risk Factors  Cigarette smoker, 1-2/day, advised to stop smoking  UDS positive for THC. Daily use per patient. Advised to stop.  Other Active Problems  Hypokalemia, 2.9->3.3>3.3. Supplement prescribed by cardiology  Cardiomyopathy, EF 35-40%. OP cardiac follow up recommended by EP for both EF eval and BP management  Hospital day # 3  Rhoderick MoodyBIBY,SHARON  [facility] See Amion for Pager information 11/03/2014 11:37 AM   I, the attending vascular neurologist, have personally obtained a history, examined the patient, evaluated laboratory data, individually viewed imaging studies and agree with radiology interpretations.  Together with the NP/PA, we formulated the assessment and plan of care which reflects our mutual decision.  I have made any additions or clarifications directly to the above note and agree with the findings and plan as currently documented.   49 yo M with uncontrolled HTN presented with right ACA stroke. Although pt has risk factors for stroke but his stroke pattern can not rule out embolic. His flat affect and abulia likely due to the frontal lesion. Will do TEE. Patient not a candidate for loop recorder, may consider outpatient 30  day monitoring. Low EF also warrant for outpatient cardiology consult. Blood pressure is still uncontrolled, increasing BP meds. Await CIR placement. Due to malignant hypertension, will check renal artery duplex, and malignant HTN labs.  Marvel PlanJindong Kenishia Plack, MD PhD Stroke Neurology 11/03/2014 3:13 PM       To contact Stroke Continuity provider, please refer to [email or web address]. After hours, contact General Neurology

## 2014-11-03 NOTE — H&P (View-Only) (Signed)
 ELECTROPHYSIOLOGY CONSULT NOTE   Patient ID: [name] Short MRN: [id number], DOB/AGE: 03/13/1966   Admit date: 10/31/2014 Date of Consult: 11/02/2014   Primary Physician: No primary care provider on file. Primary Cardiologist: new - seen by G. Taylor, MD   Pt. Profile  49 y/o male w/ h/o untreated HTN who was admitted 4/9 with left sided wkns and CVA, whom we've been asked to eval for consideration of TEE and Linq placement.  Problem List  Past Medical History  Diagnosis Date  . Hypertension     a. Previously on meds but none in several years.  . Smoker     a. Previous tobacco - quit ~ 2013. Ongoing daily marijuana usage. Occasional cigar.  . Stroke     a. 10/2014 MRI Head: acute mod size nonhemorrhagic R ant cerebral artery distribution infarct involving the medial aspect of the right frontal and parietal lobe and right aspect of the corpus callosum w/ local mass effect-->TPA;  b. 10/2014 MRA: Abrupt cut off of the R ACA A2 segment;  b. 10/2014 Carotid U/S: 1-39% bilat ICA stenosis.  . Cardiomyopathy     a. 10/2014 Echo: EF 35-40%, diff HK, sev LVH, mod dil LA, PASP 45mmHg.    History reviewed. No pertinent past surgical history.   Allergies  No Known Allergies  HPI   49 y/o male w/o prior cardiac history.  He does have a h/o HTN and at one point was placed on antihypertensive therapy and seen by cardiology in [place], but says that he couldn't afford his meds and has not been on anything for his BP in several years.  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  He does not usually have limitations in his activities.  He rarely drinks alcohol but smokes marijuana almost daily.  He says that on 4/8 he took what he thought was a muscle relaxer but turned out to be a vicodin.  A friend from [place] gave it to him.  He went to bed that night and awoke at roughly 4 am on 4/9  with diaphoresis and dyspnea.  He thought  that Ss might be related to the pill that he had taken.  He stood up to walk to the bathroom and fell to the floor dure to left sided wkns.  He was unable to get up.  EMS was called and he was taken to the [facility].  A Code stroke was activated.  Head CT was neg for bleed.  He was markedly hypertensive with a pressure of 220/110.  He was felt to be a candidate for tPA and once his BP was adequately managed (cardene/labetalol), he was treated with IV tPA.  He was admitted and monitored in the neuro ICU.  MRI/A confirmed an acute R ACA infarct with abrupt cut off of the R ACA A2 segment.  He has continued to have L sided wkns, LE more so than UE.  Echo shows moderate LV dysfxn, EF 35-40% with global HK.  Carotid U/S shows 1-39% bilat ICA stenosis.  We have been asked to eval for TEE and Linq monitor placement.  He is not currently on tele.  Inpatient Medications  . aspirin EC  81 mg Oral Daily  . clopidogrel  75 mg Oral Daily  . enoxaparin (LOVENOX) injection  40 mg Subcutaneous Q24H  . lisinopril  20 mg Oral Daily  . pantoprazole (PROTONIX) IV  40 mg   Intravenous QHS    Family History Family History  Problem Relation Age of Onset  . Other      no premature cad     Social History History   Social History  . Marital Status: N/A    Spouse Name: N/A  . Number of Children: N/A  . Years of Education: N/A   Occupational History  . Not on file.   Social History Main Topics  . Smoking status: Light Tobacco Smoker    Types: Cigars  . Smokeless tobacco: Not on file     Comment: previously smoked cigarettes - quit ~ 2013.  . Alcohol Use: 0.0 oz/week    0 Standard drinks or equivalent per week     Comment: occasional  . Drug Use: Yes     Comment: Marijuana daily.  . Sexual Activity: Not on file   Other Topics Concern  . Not on file   Social History Narrative   Lives in [place] with his GF.  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     Review of Systems  General:  No chills, fever,  night sweats or weight changes.  Cardiovascular:  No chest pain, dyspnea on exertion, edema, orthopnea, palpitations, paroxysmal nocturnal dyspnea. Dermatological: No rash, lesions/masses Respiratory: No cough, dyspnea Urologic: No hematuria, dysuria Abdominal:   No nausea, vomiting, diarrhea, bright red blood per rectum, melena, or hematemesis Neurologic:  +++ L sided wkns/dysarthria.  No visual changes, wkns, changes in mental status. All other systems reviewed and are otherwise negative except as noted above.  Physical Exam  Blood pressure 166/127, pulse 75, temperature 98.7 F (37.1 C), temperature source Oral, resp. rate 18, height 5' 10" (1.778 m), weight 205 lb 0.4 oz (93 kg), SpO2 100 %.  General: Pleasant, NAD Psych: Normal affect. Neuro: Alert and oriented X 3. Able to wiggle fingers on left hand.  Unable to move L leg.  Word searching/dysarthria present. HEENT: Normal  Neck: Supple without bruits or JVD. Lungs:  Resp regular and unlabored, CTA. Heart: RRR no s3, s4, or murmurs. Abdomen: Soft, non-tender, non-distended, BS + x 4.  Extremities: No clubbing, cyanosis or edema. DP/PT/Radials 2+ and equal bilaterally.  Labs   Lab Results  Component Value Date   WBC 5.3 11/02/2014   HGB 12.7* 11/02/2014   HCT 39.8 11/02/2014   MCV 93.4 11/02/2014   PLT 172 11/02/2014    Recent Labs Lab 10/31/14 0501  11/02/14 0701  NA 136  < > 140  K 2.9*  < > 3.3*  CL 105  < > 109  CO2 23  --  26  BUN 16  < > 12  CREATININE 1.17  < > 1.30  CALCIUM 8.6  --  8.3*  PROT 7.7  --   --   BILITOT 0.7  --   --   ALKPHOS 69  --   --   ALT 16  --   --   AST 38*  --   --   GLUCOSE 119*  < > 94  < > = values in this interval not displayed. Lab Results  Component Value Date   CHOL 164 11/01/2014   HDL 28* 11/01/2014   LDLCALC 122* 11/01/2014   TRIG 72 11/01/2014   Radiology/Studies  Ct Head Wo Contrast  10/31/2014   CLINICAL DATA:  Code stroke.  Slurred speech in extremity  weakness.  EXAM: CT HEAD WITHOUT CONTRAST  IMPRESSION: No acute intracranial abnormalities. Mild chronic small vessel ischemic changes.   Old lacunar infarct in the pons.  These results were called by telephone at the time of interpretation on 10/31/2014 at 5:10 am to Dr. DAVID GLICK , who verbally acknowledged these results.   Electronically Signed   By: William  Stevens M.D.   On: 10/31/2014 05:12   Mr Brain Ltd W/o Cm  10/31/2014   CLINICAL DATA:  Initial evaluation for acute stroke, sudden onset of inability to walk as well as use left arm.  EXAM: MRI HEAD WITHOUT CONTRAST IMPRESSION: Acute ischemic right ACA territory infarct. Please note that this study is limited as only axial diffusion-weighted sequences were performed. No other definite intracranial abnormality.   Electronically Signed   By: Benjamin  McClintock M.D.   On: 10/31/2014 06:36   Mr Mra Head/brain Wo Cm  11/01/2014   CLINICAL DATA:  49-year-old hypertensive male with acute onset of left-sided weakness. Fell secondary to weakness. Subsequent encounter.  EXAM: MRI HEAD WITHOUT CONTRAST  MRA HEAD WITHOUT CONTRAST   IMPRESSION: This exam was interpreted during a PACS downtime with limited availability of comparison cases.  MRI HEAD  Acute moderate size nonhemorrhagic right anterior cerebral artery distribution infarct involving the medial aspect of the right frontal and parietal lobe and right aspect of the corpus callosum. Local mass effect.  Tiny blood breakdown products cerebellum bilaterally may reflect result of prior hemorrhagic ischemia or prior trauma.  Moderate white matter type changes periventricular region advanced for patient's age and most likely related to result of small vessel disease in this hypertensive obese patient.  Pontine altered signal intensity may represent prominent perivascular spaces or result of prior small infarct.  Mild mucosal thickening paranasal sinuses most notable inferior right maxillary sinus with maximal  thickness of 5 mm.  MRA HEAD  Aplastic A1 segment of the left anterior cerebral artery with A2 segment of the anterior cerebral artery supplied from the right bilaterally. Irregularity of the A2 segment of the anterior cerebral bilaterally with abrupt cut off of flow of the A2 segment of the right anterior cerebral artery 1.5 cm above its origin consistent with patient's acute right anterior cerebral artery distribution infarct.  There is a small bulge at the left anterior cerebral artery A1-A2 junction which may represent origin of a small vessel although difficult to completely exclude a a tiny aneurysm. The mild motion degradation somewhat limits evaluation on source images.  Middle cerebral artery mild to moderate branch vessel irregularity with narrowing.  Posterior fossa atherosclerotic type changes as detailed above.   Electronically Signed   By: Steven  Olson M.D.   On: 11/01/2014 09:36   ECG  Sinus arrhythmia, 85, lvh with repol abnormalities.  ASSESSMENT AND PLAN  1.  R ACA Distribution Stroke:  In the setting of marked hypertension.  S/P TPA.  MRI/A shows R ACA stroke with abrupt cut off of A2 segment of R ACA - explaining presentation.  ? Embolus.  Ongoing LLE>LUE wkns and dysarthria.  We have been asked to eval re: TEEt.  There is no documentation of prior h/o afib.  ECG shows sinus rhythm.  He is not currently on tele.  After careful review of history and examination, the risks and benefits of transesophageal echocardiogram have been explained including risks of esophageal damage, perforation (1:10,000 risk), bleeding, pharyngeal hematoma as well as other potential complications associated with conscious sedation including aspiration, arrhythmia, respiratory failure and death. Alternatives to treatment were discussed, questions were answered. Patient is willing to proceed.   Provided that there is no   identifiable LA/LAA thrombus, we will then proceed with outpatient 30 day monitor.  Cont BP  mgmt, asa, plavix per IM/Neuro.  2.  Cardiomyopathy:  Echo performed this admission shows and EF of 35-40% with global HK.  Suspect that this is most likely reflective of a hypertensive cardiomyopathy given presentation with marked HTN and prior h/o untreated HTN.  That said, he will require an ischemic evaluation at some point to ensure that he does not have occult ischemia contributing to LV dysfxn.  Single POC troponin was normal in the ED.  He has no h/o chest pain or DOE prior to admission.  He appears to be euvolemic on exam.  Cont acei.  Add coreg (has been tolerating prn labetalol).  Will need to establish with general cardiology as an outpatient for medical management.  3.  HTN:  Reports a relatively long h/o untreated HTN.  Pressures now 160's to 180's.  Currently on lisinopril and IV labetalol prn.  Will add coreg 3.125 mg bid and titrate to effect.  4.  Marijuana Abuse:  Cessation advised.  5.  Hypokalemia:  supp.  Christopher Berge, NP 11/02/2014 2:44 PM   I have seen, examined the patient, and reviewed the above assessment and plan.  Changes to above are made where necessary.  The patient tells me he is "hungry, ornery, and wants his foley out".  He is minimally interactive with me on discussion and most history is per his spouse. Given fevers, noncompliance, poorly controlled htn, and need for additional outpatient cardiology workup, I do not feel that he is a candidate for ILR implant according to our cryptogenic stroke protocol.  I am not sure that he would comply with outpatient monitoring or anticoagulation should this be required.  I would therefore advise 30 monitor rather than implantable loop recorder. Will need to establish with general cardiology as an outpatient for further evaluation of depressed EF and BP management.  Electrophysiology team to see as needed while here. Please call with questions.  Co Sign: Efrem Pitstick, MD 11/03/2014 10:07 AM    

## 2014-11-03 NOTE — Progress Notes (Signed)
Patients Blood pressure was 178/115 at 2120 gave 20 mg of labetalol blood pressure went down to 152/92 20 min later, blood pressure at 0157 was 176/111 gave another 20 mg of labetalol.  Will check again in 20 min, also his temp was 100.1 and I gave him 650 mg of tylenol will assess temp as well in 45 min.

## 2014-11-03 NOTE — Consult Note (Signed)
Physical Medicine and Rehabilitation Consult Reason for Consult: Right ACA infarct Referring Physician: Dr.Xu    HPI: [name] Short is a 49 y.o.right handed  male with history of hypertension as well as remote tobacco abuse. Patient on no scheduled medications upon admission. Patient independent prior to admission living with his girlfriend working at [employer]. Presented 10/31/2014 with left-sided weakness and altered mental status. Urine drug screen positive for marijuana. MRI of the brain showed acute ischemic right ACA territory infarct. 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 Irregularity of the A2 segment of the anterior cerebral bilaterally with abrupt cut off of flow at the A2 segment of the right ACA 1.5 cm above its origin consistent with patient's acute right ACA distribution infarct. Carotid Dopplers were no ICA stenosis. Echocardiogram with ejection fraction 35-40% with diffuse hypokinesis. Venous Doppler studies lower extremities negative for DVT. Neurology consulted patient did not receive TPA. Presently maintained on aspirin/Plavix for CVA prophylaxis as well as subcutaneous Lovenox for DVT prophylaxis. TEE is pending as well as loop recorder placement. Speech therapy follow-up noted poor attention to the left as well as limited awareness of deficits. Occupational therapy evaluation completed with recommendations of physical medicine rehabilitation consult   Review of Systems  Gastrointestinal: Positive for constipation.  Neurological: Positive for weakness.  All other systems reviewed and are negative.  Past Medical History  Diagnosis Date  . Hypertension     a. Previously on meds but none in several years.  . Smoker     a. Previous tobacco - quit ~ 2013. Ongoing daily marijuana usage. Occasional cigar.  . Stroke     a. 10/2014 MRI Head: acute  mod size nonhemorrhagic R ant cerebral artery distribution infarct involving the medial aspect of the right frontal and parietal lobe and right aspect of the corpus callosum w/ local mass effect-->TPA;  b. 10/2014 MRA: Abrupt cut off of the R ACA A2 segment;  b. 10/2014 Carotid U/S: 1-39% bilat ICA stenosis.  . Cardiomyopathy     a. 10/2014 Echo: EF 35-40%, diff HK, sev LVH, mod dil LA, PASP .   History reviewed. No pertinent past surgical history. Family History  Problem Relation Age of Onset  . Other      no premature cad   Social History:  reports that he has been smoking Cigars.  He does not have any smokeless tobacco history on file. He reports that he drinks alcohol. He reports that he uses illicit drugs. Allergies: No Known Allergies Medications Prior to Admission  Medication Sig Dispense Refill  . HYDROCODONE-ACETAMINOPHEN PO Take 1 tablet by mouth once.      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 [employer] Status:  Mobility: Bed Mobility Overal bed mobility: +2 for physical assistance, Needs Assistance, + 2 for safety/equipment Bed Mobility: Supine to Sit, Sit to Supine Supine to sit: Max assist, +2 for physical assistance, +2 for safety/equipment Sit to supine: +2 for safety/equipment, +2 for physical assistance, Max assist General bed mobility comments: cues for sequence and L UE / LE positioning Transfers Overall transfer level: Needs assistance Equipment used: None Transfers: Sit  to/from Stand Sit to Stand: +2 physical assistance, Max assist General transfer comment: Two person assist with gait belt and bed pad; unable to achieve full standing  position, but limited by increase in BP Ambulation/Gait General Gait Details: Unable to assess due to BP    ADL: ADL Overall ADL's : Needs assistance/impaired Eating/Feeding: Minimal assistance, Sitting Eating/Feeding Details (indicate cue type and reason): cues for swallowing precautions Grooming: Moderate assistance, Sitting Upper Body Bathing: Moderate assistance, Bed level Lower Body Bathing: Total assistance General ADL Comments: pt completed supine to sit eob with incr BP and returned to supine. pt sitting eob to work on trunk stability. WIfe present in room clapping at patient for arousal and calling name to the L. Spouse turning patients head with hands physically during session and asking if a neck pillow would prevent the R side neck rotation. Family advised to allow patient to rotate neck and a neck pillow is not advised at this time  Cognition: Cognition Overall Cognitive Status: Impaired/Different from baseline Arousal/Alertness: Awake/alert Orientation Level: Oriented to person, Oriented to place Attention: Sustained, Focused Focused Attention: Appears intact Sustained Attention: Impaired Sustained Attention Impairment: Verbal basic, Functional basic Awareness: Impaired Awareness Impairment: Intellectual impairment (decreased awareness to decreased left attention and delayed responses) Problem Solving: Impaired (pt needed assistance to locate call bell - ? fatigue factor) Problem Solving Impairment: Verbal basic, Functional basic Safety/Judgment: Impaired Cognition Arousal/Alertness: Lethargic Behavior During Therapy: Flat affect Overall Cognitive Status: Impaired/Different from baseline Area of Impairment: Attention, Awareness, Safety/judgement, Following commands, Problem solving Current Attention Level: Sustained Memory: Decreased recall of precautions, Decreased short-term memory Following Commands: Follows one step commands with increased time, Follows one step  commands inconsistently Safety/Judgement: Decreased awareness of deficits, Decreased awareness of safety Awareness: Intellectual Problem Solving: Difficulty sequencing, Slow processing, Decreased initiation, Requires verbal cues, Requires tactile cues General Comments: L side neglect  Blood pressure 168/102, pulse 78, temperature 99.4 F (37.4 C), temperature source Oral, resp. rate 16, height  (1.778 m), weight 93 kg (205 lb 0.4 oz), SpO2 100 %. Physical Exam  Constitutional: He appears well-developed.  Eyes:  Pupils round and reactive to light  Neck: Normal range of motion. Neck supple. No thyromegaly present.  Cardiovascular: Normal rate and regular rhythm.   Respiratory: Effort normal and breath sounds normal. No respiratory distress.  GI: Soft. Bowel sounds are normal. He exhibits no distension. There is no tenderness.  Musculoskeletal: He exhibits no edema.  Neurological: He is alert.   Left-sided inattention. He was able to state his name but needed multiple cues for place. Needing cues to follow basic directions. Flexor synergy pattern LUE and extensor pattern in lower. Has difficulty initiating movements with left. Slight sense of pain. Fair sitting balance. Left central 7 and tongue deviation  Skin: Skin is warm and dry.  Psychiatric:  flat    Results for orders placed or performed during the hospital encounter of 10/31/14 (from the past 24 hour(s))  CBC     Status: Abnormal   Collection Time: 11/02/14  7:01 AM  Result Value Ref Range   WBC 5.3 4.0 - 10.5 K/uL   RBC 4.26 4.22 - 5.81 MIL/uL   Hemoglobin 12.7 (L) 13.0 - 17.0 g/dL   HCT 47.8 29.5 - 62.1 %   MCV 93.4 78.0 - 100.0 fL   MCH 29.8 26.0 - 34.0 pg   MCHC 31.9 30.0 - 36.0 g/dL   RDW 30.8 65.7 - 84.6 %   Platelets 172 150 - 400 K/uL  Basic metabolic panel     Status: Abnormal   Collection Time: 11/02/14  7:01 AM  Result Value Ref Range   Sodium 140 135 - 145 mmol/L   Potassium 3.3 (L) 3.5 - 5.1 mmol/L    Chloride 109 96 - 112 mmol/L   CO2 26 19 - 32 mmol/L   Glucose, Bld 94 70 - 99 mg/dL   BUN 12 6 - 23 mg/dL   Creatinine, Ser 1.61 0.50 - 1.35 mg/dL   Calcium 8.3 (L) 8.4 - 10.5 mg/dL   GFR calc non Af Amer 64 (L) >90 mL/min   GFR calc Af Amer 74 (L) >90 mL/min   Anion gap 5 5 - 15   Mr Brain Wo Contrast  11/01/2014   CLINICAL DATA:  49 year old hypertensive male with acute onset of left-sided weakness. Fell secondary to weakness. Subsequent encounter.  EXAM: MRI HEAD WITHOUT CONTRAST  MRA HEAD WITHOUT CONTRAST  TECHNIQUE: Multiplanar, multiecho pulse sequences of the brain and surrounding structures were obtained without intravenous contrast. Angiographic images of the head were obtained using MRA technique without contrast.  COMPARISON:  10/31/2014 brain MR and head CT.  FINDINGS: MRI HEAD FINDINGS  Acute moderate size nonhemorrhagic right anterior cerebral artery distribution infarct involving the medial aspect of the right frontal and parietal lobe and right aspect of the corpus callosum. Local mass effect.  Tiny blood breakdown products cerebellum bilaterally may reflect result of prior hemorrhagic ischemia or prior trauma.  Moderate white matter type changes periventricular region advanced for patient's age and most likely related to result of small vessel disease in this hypertensive obese patient. Other causes white matter type changes felt to be less likely considerations.  Pontine altered signal intensity may represent prominent perivascular spaces or result of prior small infarct.  No intracranial mass lesion noted on this unenhanced exam.  No hydrocephalus.  Exophthalmos.  Mild mucosal thickening paranasal sinuses most notable inferior right maxillary sinus with maximal thickness of 5 mm.  Mild cervical spondylotic changes C3-4 mild spinal stenosis. Cervical medullary junction, pituitary region and pineal region unremarkable.  MRA HEAD FINDINGS  Aplastic A1 segment of the left anterior cerebral  artery with A2 segment of the anterior cerebral artery supplied from the right bilaterally. Irregularity of the A2 segment of the anterior cerebral bilaterally with abrupt cut off of flow of the A2 segment of the right anterior cerebral artery 1.5 cm above its origin consistent with patient's acute right anterior cerebral artery distribution infarct.  There is a small bulge at the left anterior cerebral artery A1-A2 junction which may represent origin of a small vessel although difficult to completely exclude a a tiny aneurysm. The mild motion degradation somewhat limits evaluation on source images.  Mild irregularity and minimal narrowing cavernous segment right internal carotid artery.  Fetal type origin of the left posterior cerebral artery.  Middle cerebral artery mild to moderate branch vessel irregularity with narrowing.  Left vertebral artery is poorly delineated and appears to end in a posterior inferior cerebellar artery distribution. Left posterior inferior cerebellar artery with moderate tandem stenoses.  Ectatic right vertebral artery without high-grade stenosis.  Moderate tandem stenosis right posterior inferior cerebellar artery.  Ectatic basilar artery with areas of mild to slight moderate narrowing most notable mid aspect.  Nonvisualized anterior inferior cerebellar arteries.  Mild to moderate irregularity superior cerebellar artery bilaterally.  Mild narrowing proximal right posterior cerebral artery. Fetal type origin of the left posterior cerebral artery. Moderate narrowing portions of P2/P3 and distal  branches of the posterior cerebral artery bilaterally.  IMPRESSION: This exam was interpreted during a PACS downtime with limited availability of comparison cases.  MRI HEAD  Acute moderate size nonhemorrhagic right anterior cerebral artery distribution infarct involving the medial aspect of the right frontal and parietal lobe and right aspect of the corpus callosum. Local mass effect.  Tiny blood  breakdown products cerebellum bilaterally may reflect result of prior hemorrhagic ischemia or prior trauma.  Moderate white matter type changes periventricular region advanced for patient's age and most likely related to result of small vessel disease in this hypertensive obese patient.  Pontine altered signal intensity may represent prominent perivascular spaces or result of prior small infarct.  Mild mucosal thickening paranasal sinuses most notable inferior right maxillary sinus with maximal thickness of 5 mm.  MRA HEAD  Aplastic A1 segment of the left anterior cerebral artery with A2 segment of the anterior cerebral artery supplied from the right bilaterally. Irregularity of the A2 segment of the anterior cerebral bilaterally with abrupt cut off of flow of the A2 segment of the right anterior cerebral artery 1.5 cm above its origin consistent with patient's acute right anterior cerebral artery distribution infarct.  There is a small bulge at the left anterior cerebral artery A1-A2 junction which may represent origin of a small vessel although difficult to completely exclude a a tiny aneurysm. The mild motion degradation somewhat limits evaluation on source images.  Middle cerebral artery mild to moderate branch vessel irregularity with narrowing.  Posterior fossa atherosclerotic type changes as detailed above.   Electronically Signed   By: Lacy Duverney M.D.   On: 11/01/2014 09:36   Mr Maxine Glenn Head/brain Wo Cm  11/01/2014   CLINICAL DATA:  49 year old hypertensive male with acute onset of left-sided weakness. Fell secondary to weakness. Subsequent encounter.  EXAM: MRI HEAD WITHOUT CONTRAST  MRA HEAD WITHOUT CONTRAST  TECHNIQUE: Multiplanar, multiecho pulse sequences of the brain and surrounding structures were obtained without intravenous contrast. Angiographic images of the head were obtained using MRA technique without contrast.  COMPARISON:  10/31/2014 brain MR and head CT.  FINDINGS: MRI HEAD FINDINGS  Acute  moderate size nonhemorrhagic right anterior cerebral artery distribution infarct involving the medial aspect of the right frontal and parietal lobe and right aspect of the corpus callosum. Local mass effect.  Tiny blood breakdown products cerebellum bilaterally may reflect result of prior hemorrhagic ischemia or prior trauma.  Moderate white matter type changes periventricular region advanced for patient's age and most likely related to result of small vessel disease in this hypertensive obese patient. Other causes white matter type changes felt to be less likely considerations.  Pontine altered signal intensity may represent prominent perivascular spaces or result of prior small infarct.  No intracranial mass lesion noted on this unenhanced exam.  No hydrocephalus.  Exophthalmos.  Mild mucosal thickening paranasal sinuses most notable inferior right maxillary sinus with maximal thickness of 5 mm.  Mild cervical spondylotic changes C3-4 mild spinal stenosis. Cervical medullary junction, pituitary region and pineal region unremarkable.  MRA HEAD FINDINGS  Aplastic A1 segment of the left anterior cerebral artery with A2 segment of the anterior cerebral artery supplied from the right bilaterally. Irregularity of the A2 segment of the anterior cerebral bilaterally with abrupt cut off of flow of the A2 segment of the right anterior cerebral artery 1.5 cm above its origin consistent with patient's acute right anterior cerebral artery distribution infarct.  There is a small bulge at the left anterior cerebral  artery A1-A2 junction which may represent origin of a small vessel although difficult to completely exclude a a tiny aneurysm. The mild motion degradation somewhat limits evaluation on source images.  Mild irregularity and minimal narrowing cavernous segment right internal carotid artery.  Fetal type origin of the left posterior cerebral artery.  Middle cerebral artery mild to moderate branch vessel irregularity with  narrowing.  Left vertebral artery is poorly delineated and appears to end in a posterior inferior cerebellar artery distribution. Left posterior inferior cerebellar artery with moderate tandem stenoses.  Ectatic right vertebral artery without high-grade stenosis.  Moderate tandem stenosis right posterior inferior cerebellar artery.  Ectatic basilar artery with areas of mild to slight moderate narrowing most notable mid aspect.  Nonvisualized anterior inferior cerebellar arteries.  Mild to moderate irregularity superior cerebellar artery bilaterally.  Mild narrowing proximal right posterior cerebral artery. Fetal type origin of the left posterior cerebral artery. Moderate narrowing portions of P2/P3 and distal branches of the posterior cerebral artery bilaterally.  IMPRESSION: This exam was interpreted during a PACS downtime with limited availability of comparison cases.  MRI HEAD  Acute moderate size nonhemorrhagic right anterior cerebral artery distribution infarct involving the medial aspect of the right frontal and parietal lobe and right aspect of the corpus callosum. Local mass effect.  Tiny blood breakdown products cerebellum bilaterally may reflect result of prior hemorrhagic ischemia or prior trauma.  Moderate white matter type changes periventricular region advanced for patient's age and most likely related to result of small vessel disease in this hypertensive obese patient.  Pontine altered signal intensity may represent prominent perivascular spaces or result of prior small infarct.  Mild mucosal thickening paranasal sinuses most notable inferior right maxillary sinus with maximal thickness of 5 mm.  MRA HEAD  Aplastic A1 segment of the left anterior cerebral artery with A2 segment of the anterior cerebral artery supplied from the right bilaterally. Irregularity of the A2 segment of the anterior cerebral bilaterally with abrupt cut off of flow of the A2 segment of the right anterior cerebral artery 1.5  cm above its origin consistent with patient's acute right anterior cerebral artery distribution infarct.  There is a small bulge at the left anterior cerebral artery A1-A2 junction which may represent origin of a small vessel although difficult to completely exclude a a tiny aneurysm. The mild motion degradation somewhat limits evaluation on source images.  Middle cerebral artery mild to moderate branch vessel irregularity with narrowing.  Posterior fossa atherosclerotic type changes as detailed above.   Electronically Signed   By: Lacy Duverney M.D.   On: 11/01/2014 09:36    Assessment/Plan: Diagnosis: Right ACA infarct 1. Does the need for close, 24 hr/day medical supervision in concert with the patient's rehab needs make it unreasonable for this patient to be served in a less intensive setting? Yes 2. Co-Morbidities requiring supervision/potential complications: htn 3. Due to bladder management, bowel management, safety, skin/wound care, disease management, medication administration, pain management and patient education, does the patient require 24 hr/day rehab nursing? Yes 4. Does the patient require coordinated care of a physician, rehab nurse, PT (1-2 hrs/day, 5 days/week), OT (1-2 hrs/day, 5 days/week) and SLP (1-2 hrs/day, 5 days/week) to address physical and functional deficits in the context of the above medical diagnosis(es)? Yes Addressing deficits in the following areas: balance, endurance, locomotion, strength, transferring, bowel/bladder control, bathing, dressing, feeding, grooming, toileting, cognition, speech, swallowing and psychosocial support 5. Can the patient actively participate in an intensive therapy program of at  least 3 hrs of therapy per day at least 5 days per week? Yes 6. The potential for patient to make measurable gains while on inpatient rehab is excellent 7. Anticipated functional outcomes upon discharge from inpatient rehab are min assist  with PT, supervision and min  assist /mod assist with OT, supervision, min assist and mod assist with SLP. 8. Estimated rehab length of stay to reach the above functional goals is: 20-28 days 9. Does the patient have adequate social supports and living environment to accommodate these discharge functional goals? Yes 10. Anticipated D/C setting: Home 11. Anticipated post D/C treatments: HH therapy and Outpatient therapy 12. Overall Rehab/Functional Prognosis: excellent  RECOMMENDATIONS: This patient's condition is appropriate for continued rehabilitative care in the following setting: CIR Patient has agreed to participate in recommended program. Yes Note that insurance prior authorization may be required for reimbursement for recommended care.  Comment: Rehab Admissions Coordinator to follow up.  Thanks,  Ranelle OysterZachary T. Hassaan Crite, MD, [place] DomFAAPMR     11/03/2014

## 2014-11-04 ENCOUNTER — Encounter (HOSPITAL_COMMUNITY): Payer: Self-pay | Admitting: Cardiology

## 2014-11-04 DIAGNOSIS — I10 Essential (primary) hypertension: Secondary | ICD-10-CM

## 2014-11-04 DIAGNOSIS — I639 Cerebral infarction, unspecified: Secondary | ICD-10-CM

## 2014-11-04 LAB — CBC
HCT: 43.6 % (ref 39.0–52.0)
Hemoglobin: 14.1 g/dL (ref 13.0–17.0)
MCH: 29.6 pg (ref 26.0–34.0)
MCHC: 32.3 g/dL (ref 30.0–36.0)
MCV: 91.6 fL (ref 78.0–100.0)
PLATELETS: 188 10*3/uL (ref 150–400)
RBC: 4.76 MIL/uL (ref 4.22–5.81)
RDW: 14.3 % (ref 11.5–15.5)
WBC: 6.1 10*3/uL (ref 4.0–10.5)

## 2014-11-04 LAB — BASIC METABOLIC PANEL
Anion gap: 8 (ref 5–15)
BUN: 10 mg/dL (ref 6–23)
CALCIUM: 8.6 mg/dL (ref 8.4–10.5)
CO2: 23 mmol/L (ref 19–32)
Chloride: 105 mmol/L (ref 96–112)
Creatinine, Ser: 1.23 mg/dL (ref 0.50–1.35)
GFR calc Af Amer: 79 mL/min — ABNORMAL LOW (ref >90)
GFR, EST NON AFRICAN AMERICAN: 68 mL/min — AB (ref >90)
GLUCOSE: 89 mg/dL (ref 70–99)
Potassium: 3.7 mmol/L (ref 3.5–5.1)
Sodium: 136 mmol/L (ref 135–145)

## 2014-11-04 MED ORDER — CARVEDILOL 12.5 MG PO TABS
12.5000 mg | ORAL_TABLET | Freq: Two times a day (BID) | ORAL | Status: DC
  Administered 2014-11-05: 12.5 mg via ORAL
  Filled 2014-11-04: qty 1

## 2014-11-04 NOTE — Progress Notes (Signed)
Rehab admissions - I contacted patient's wife and she came in to speak with me.  Wife tearful and worried about how patient will recover.  Wife is agreeable to inpatient rehab admission.  I hope to have a bed tomorrow.  I will follow up in am for bed availability.  Call me for questions.  #[phone]

## 2014-11-04 NOTE — Progress Notes (Addendum)
STROKE TEAM PROGRESS NOTE   HISTORY [name] Short is an 49 y.o. male history of HTN presenting with acute onset of left sided weakness. Per patient, he awoke normal at 0400 on 10/31/2014 (LKW), went to the bathroom, on the way back he fell due to weakness of his left side. Upon EMS arrival he was noted to have BP of 220/110 and appeared to be slumping to the left. They note his mental status appeared to fluctuate. Family reports poor compliance with his home BP medications. CT head imaging reviewed and no acute process. Due to inconsistent nature of exam he was taken for a DWI which showed acute infarct. Initial NIHSS of 7. tPA was Given. There was a delay as patient had refractory hypertension requiring multiple doses of IV labetalol and a cardene drip. He was admitted for further evaluation and treatment.   SUBJECTIVE (INTERVAL HISTORY) No family at bedside. Still has flat affect, not cooperating on exam, and not cooperating with CIR coordinator. Awaiting the arrival of his wife to set up CIR.   OBJECTIVE Temp:  [98.3 F (36.8 C)-99.8 F (37.7 C)] 98.3 F (36.8 C) (04/13 0647) Pulse Rate:  [66-97] 71 (04/13 0647) Cardiac Rhythm:  [-] Normal sinus rhythm (04/13 0800) Resp:  [12-30] 18 (04/13 0647) BP: (138-233)/(86-163) 178/107 mmHg (04/13 0647) SpO2:  [98 %-100 %] 98 % (04/13 0647)   Recent Labs Lab 10/31/14 2331 11/01/14 0341 11/01/14 0826 11/01/14 1145 11/01/14 1637  GLUCAP 107* 103* 94 92 93    Recent Labs Lab 10/31/14 0501 10/31/14 0505 10/31/14 1955 11/02/14 0701 11/03/14 0711 11/04/14 0437  NA 136 140  --  140 137 136  K 2.9* 2.9* 3.3* 3.3* 3.4* 3.7  CL 105 102  --  109 105 105  CO2 23  --   --  GLUCOSE 119* 123*  --  94 87 89  BUN 16 18  --  CREATININE 1.17 1.00  --  1.30 1.19 1.23  CALCIUM 8.6  --   --  8.3* 8.6 8.6    Recent Labs Lab 10/31/14 0501  AST 38*  ALT 16  ALKPHOS 69  BILITOT 0.7  PROT 7.7  ALBUMIN 3.2*    Recent  Labs Lab 10/31/14 0501 10/31/14 0505 11/02/14 0701 11/03/14 0711 11/04/14 0437  WBC 5.9  --  5.3 5.1 6.1  NEUTROABS 3.7  --   --   --   --   HGB 12.9* 15.0 12.7* 13.3 14.1  HCT 40.8 44.0 39.8 40.9 43.6  MCV 94.4  --  93.4 91.9 91.6  PLT 152  --  172 168 188   No results for input(s): CKTOTAL, CKMB, CKMBINDEX, TROPONINI in the last 168 hours. No results for input(s): LABPROT, INR in the last 72 hours. No results for input(s): COLORURINE, LABSPEC, PHURINE, GLUCOSEU, HGBUR, BILIRUBINUR, KETONESUR, PROTEINUR, UROBILINOGEN, NITRITE, LEUKOCYTESUR in the last 72 hours.  Invalid input(s): APPERANCEUR     Component Value Date/Time   CHOL 164 11/01/2014 0601   TRIG 72 11/01/2014 0601   HDL 28* 11/01/2014 0601   CHOLHDL 5.9 11/01/2014 0601   VLDL 14 11/01/2014 0601   LDLCALC 122* 11/01/2014 0601   Lab Results  Component Value Date   HGBA1C 5.8* 11/01/2014      Component Value Date/Time   LABOPIA NONE DETECTED 10/31/2014 0624   COCAINSCRNUR NONE DETECTED 10/31/2014 0624   LABBENZ NONE DETECTED 10/31/2014 0624   AMPHETMU NONE DETECTED 10/31/2014 0624   THCU  POSITIVE* 10/31/2014 0624   LABBARB NONE DETECTED 10/31/2014 0624     Recent Labs Lab 10/31/14 0500  ETH <5   I have personally reviewed the radiological images below and agree with the radiology interpretations.  Ct Head Wo Contrast 10/31/2014 No acute intracranial abnormalities. Mild chronic small vessel ischemic changes. Old lacunar infarct in the pons.   MRI HEAD 11/01/2014 Acute moderate size nonhemorrhagic right anterior cerebral artery distribution infarct involving the medial aspect of the right frontal and parietal lobe and right aspect of the corpus callosum. Local mass effect. Tiny blood breakdown products cerebellum bilaterally may reflect result of prior hemorrhagic ischemia or prior trauma. Moderate white matter type changes periventricular region advanced for patient's age and most likely related to  result of small vessel disease in this hypertensive obese patient. Pontine altered signal intensity may represent prominent perivascular spaces or result of prior small infarct. Mild mucosal thickening paranasal sinuses most notable inferior right maxillary sinus with maximal thickness of 5 mm.   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 Irregularity of the A2 segment of the anterior cerebral bilaterally with abrupt cut off of flow of the A2 segment of the right anterior cerebral artery 1.5 cm above its origin consistent with patient's acute right anterior cerebral artery distribution infarct. There is a small bulge at the left anterior cerebral artery A1-A2 junction which may represent origin of a small vessel although difficult to completely exclude a a tiny aneurysm. The mild motion degradation somewhat limits evaluation on source images. Middle cerebral artery mild to moderate branch vessel irregularity with narrowing. Posterior fossa atherosclerotic type changes as detailed above. Electronically Signed By: Lacy DuverneySteven Olson M.D. On: 11/01/2014 09:36   Carotid Doppler There is 1-39% bilateral ICA stenosis. Vertebral artery flow is antegrade.   Bilateral lower extremity venous Dopplers There is no DVT or SVT noted in the bilateral lower extremities.   2D Echocardiogram  - Left ventricle: The cavity size was mildly dilated. Wallthickness was increased in a pattern of severe LVH. Systolicfunction was moderately reduced. The estimated ejection fractionwas in the range of 35% to 40%. Diffuse hypokinesis. Dopplerparameters are consistent with elevated ventricular end-diastolicfilling pressure. - Left atrium: The atrium was moderately dilated. - Atrial septum: No defect or patent foramen ovale was identified. - Pulmonary arteries: PA peak pressure: 45 mm Hg  (S). Impressions: In the absence of HTN or renal failure cannot r/oamyloid.  TEE - No definite source of embolus. EF 35% with moderate LVH and smoke but no thrombus in LA appendage.   Renal artery duplex -  RRA/AO ratio: 1.90         LRA/AO ratio: 1.24 No evidence of renal artery stenosis bilaterally. No AAA or renal cysts/ masses seen bilaterally.   PHYSICAL EXAM  Temp:  [98.3 F (36.8 C)-99.8 F (37.7 C)] 98.3 F (36.8 C) (04/13 0647) Pulse Rate:  [66-97] 71 (04/13 0647) Resp:  [12-30] 18 (04/13 0647) BP: (138-233)/(86-163) 178/107 mmHg (04/13 0647) SpO2:  [98 %-100 %] 98 % (04/13 0647)  General - obses, well developed, flat affect, abulia, not willing to participate in normal conversation.  Ophthalmologic - not cooperative on exam  Cardiovascular - Regular rate and rhythm with no murmur.  Neuro - pt flat affect, abulia, not cooperative on exma, not engaged in normal conversation, eventually answered some questions appropriately. Able to repeat and only named 1/3. PERRL, left facial droop,  tongue in middle, left UE distal 3/5, proximal 0/5, LLE 1/5. Reflex 1+ and babinski positive on the left. Sensation symmetrical. Coordination intact on the right hand. Gait not tested due to weakness.  ASSESSMENT/PLAN Mr. [name] Short is a 49 y.o. male with history of hypertension  presenting with left hemiparesis. He did receive IV t-PA 10/31/2014 at 0646.   Stroke:  Non-dominant right ACA infarct embolic secondary to unknown source  Resultant  Left hemiparesis, abulia   MRI  Right ACA infarct  MRA  right A2 occlusion  Carotid Doppler  No significant stenosis  2D Echo  EF 35-40%. Hypokinesis. No obvious embolus.  TEE No definite source of embolus. EF 35% with moderate LVH and smoke but no thrombus in LA appendage.  Does not recommend implantable loop recorder due to past noncompliance and concern for noncompliance with monitoring and  anticoagulation if needed. OP 30 day monitor recommended. Also OP cardiology consult recommended for low EF.  HgbA1c 5.8  hypercoagulable work up pending  Lovenox 40 mg sq daily for VTE prophylaxis Diet NPO time specified Except for: Sips with Meds  no antithrombotic prior to admission, now on dural antiplatelet for 3 months and then Plavix alone.  Patient counseled to be compliant with his antithrombotic medications  Ongoing aggressive stroke risk factor management  Therapy recommendations:  CIR.   Disposition:  Pending. Rehab admissions coordinator following (lives with girlfriend)  Cardiomyopathy  Low EF 35-40%  Likely due to long-standing and controlled hypertension  Cardiology recommend outpatient cardiology consult.  Malignant Hypertension  Blood pressure 210/110 on arrival  Home meds:  None  Uncontrolled, BP continues to be elevated.  Blood pressure goal - gradually normotensive  Increased lisinopril to 40, add amlodipine 10, increase Coreg to 12.5 bid.  Patient counseled to be compliant with his blood pressure medications  Renal artery duplex - negative for renal artery stenosis   Malignant HTN labs pending.  Hyperlipidemia  Home meds:  No statin  LDL 122, goal < 70  Add statin, lipitor 40 daily  Continue statin at discharge  Other Stroke Risk Factors  Cigarette smoker, 1-2/day, advised to stop smoking  UDS positive for THC. Daily use per patient. Advised to stop.  Other Active Problems  Hypokalemia, 2.9->3.3>3.3. Supplement prescribed by cardiology  Cardiomyopathy, EF 35-40%. OP cardiac follow up recommended by EP for both EF eval and BP management  Hospital day # 4  Giordano,[name] Short [facility] See Amion for Pager information 11/04/2014 10:02 AM   I, the attending vascular neurologist, have personally obtained a history, examined the patient, evaluated laboratory data, individually viewed imaging studies and agree  with radiology interpretations.  Together with the NP/PA, we formulated the assessment and plan of care which reflects our mutual decision.  I have made any additions or clarifications directly to the above note and agree with the findings and plan as currently documented.   49 yo M with uncontrolled HTN presented with right ACA stroke. Although pt has risk factors for stroke but his stroke pattern can not rule out embolic. His flat affect and abulia likely due to the frontal lesion. Will do TEE. Patient not a candidate for loop recorder, may consider outpatient 30 day monitoring. Low EF also warrant for outpatient cardiology consult. Blood pressure is still uncontrolled, increasing BP meds. Renal artery duplex negative for renal artery stenosis. Await CIR placement.   Marvel Plan, MD PhD Stroke Neurology 11/04/2014 10:02 AM       To contact  Stroke Continuity provider, please refer to [email or web address] After hours, contact General Neurology

## 2014-11-04 NOTE — Progress Notes (Signed)
VASCULAR LAB PRELIMINARY  PRELIMINARY  PRELIMINARY  PRELIMINARY  Renal Artery Duplex completed.    Preliminary report:  RRA/AO ratio: 1.90                                 LRA/AO ratio:  1.24 No evidence of renal artery stenosis bilaterally.   No AAA or renal cysts/ masses seen bilaterally.  Loralie ChampagneBishop, Becky Berberian F, RVT 11/04/2014, 2:58 PM

## 2014-11-04 NOTE — PMR Pre-admission (Signed)
PMR Admission Coordinator Pre-Admission Assessment  Patient: [name] Short is an 49 y.o., male MRN: [id number] DOB: [birth year] Height: 5\' 10"  (177.8 cm) Weight: 93 kg (205 lb 0.4 oz)              Insurance Information Self pay - no insurance  Medicaid Application Date:      Case Manager:   Disability Application Date:      Case Worker:    Emergency Conservator, museum/gallery Information    Name Relation Home Work Mobile   Self,[name] Spouse   [phone]   Sheppard,Tiffany Daughter   [phone]     Current Medical History  Patient Admitting Diagnosis:  R ACA infarct  History of Present Illness: A 49 y.o.right handed male with history of hypertension as well as remote tobacco abuse. Patient on no scheduled medications upon admission. Patient independent prior to admission living with his girlfriend working at [employer]. Presented 10/31/2014 with left-sided weakness and altered mental status. Noted blood pressure 220/110. Urine drug screen positive for marijuana. MRI of the brain showed acute ischemic right ACA territory infarct. 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 Irregularity of the A2 segment of the anterior cerebral bilaterally with abrupt cut off of flow at the A2 segment of the right ACA 1.5 cm above its origin consistent with patient's acute right ACA distribution infarct. Carotid Dopplers were no ICA stenosis. Echocardiogram with ejection fraction 35-40% with diffuse hypokinesis. Venous Doppler studies lower extremities negative for DVT. Neurology consulted patient did receive TPA. Presently maintained on aspirin/Plavix for CVA prophylaxis as well as subcutaneous Lovenox for DVT prophylaxis. TEE showed ejection fraction of 35% without embolus or thrombus. No plan at this time for loop recorder and considering outpatient 30 day monitoring. Blood  pressure has been monitored closely as initially patient did receive Cardene drip. A renal artery duplex was completed showing no evidence of renal artery stenosis bilaterally. Speech therapy follow-up noted poor attention to the left as well as limited awareness of deficits. He is tolerating a mechanical soft diet. Physical and Occupational therapy evaluations completed with recommendations of physical medicine rehabilitation consult.  Total: 8=NIH  Past Medical History  Past Medical History  Diagnosis Date  . Hypertension     a. Previously on meds but none in several years.  . Smoker     a. Previous tobacco - quit ~ 2013. Ongoing daily marijuana usage. Occasional cigar.  . Stroke     a. 10/2014 MRI Head: acute mod size nonhemorrhagic R ant cerebral artery distribution infarct involving the medial aspect of the right frontal and parietal lobe and right aspect of the corpus callosum w/ local mass effect-->TPA;  b. 10/2014 MRA: Abrupt cut off of the R ACA A2 segment;  b. 10/2014 Carotid U/S: 1-39% bilat ICA stenosis.  . Cardiomyopathy     a. 10/2014 Echo: EF 35-40%, diff HK, sev LVH, mod dil LA, PASP .    Family History  family history includes Other in an other family member.  Prior Rehab/Hospitalizations:  No previous rehab admissions.   Current Medications   Current facility-administered medications:  .  0.9 %  sodium chloride infusion, , Intravenous, Continuous, Ok Anis, NP, Last Rate: 20 mL/hr at 11/03/14 1402, 500 mL at 11/03/14 1402 .  acetaminophen (TYLENOL) tablet 650 mg, 650 mg, Oral, Q4H PRN, 650 mg at 11/03/14 0302 **OR** acetaminophen (TYLENOL)  suppository 650 mg, 650 mg, Rectal, Q4H PRN, Ramond Marrow, DO .  amLODipine (NORVASC) tablet 10 mg, 10 mg, Oral, Daily, Marvel Plan, MD, 10 mg at 11/05/14 0847 .  aspirin EC tablet 81 mg, 81 mg, Oral, Daily, Layne Benton, NP, 81 mg at 11/05/14 0846 .  atorvastatin (LIPITOR) tablet 40 mg, 40 mg, Oral, q1800, Layne Benton, NP, 40 mg at 11/04/14 1816 .  carvedilol (COREG) tablet 12.5 mg, 12.5 mg, Oral, BID WC, Marvel Plan, MD, 12.5 mg at 11/05/14 0847 .  clopidogrel (PLAVIX) tablet 75 mg, 75 mg, Oral, Daily, Layne Benton, NP, 75 mg at 11/05/14 0846 .  enoxaparin (LOVENOX) injection 40 mg, 40 mg, Subcutaneous, Q24H, York Spaniel, MD, 40 mg at 11/04/14 2142 .  labetalol (NORMODYNE,TRANDATE) injection 20 mg, 20 mg, Intravenous, Q10 min PRN, Dione Booze, MD, 20 mg at 11/05/14 0234 .  lisinopril (PRINIVIL,ZESTRIL) tablet 40 mg, 40 mg, Oral, Daily, Layne Benton, NP, 40 mg at 11/05/14 0846 .  pantoprazole (PROTONIX) EC tablet 40 mg, 40 mg, Oral, QHS, Ann Held, [facility]  Patients Current Diet: DIET DYS 3 Room service appropriate?: Yes; Fluid consistency:: Thin  Precautions / Restrictions Precautions Precautions: Fall Restrictions Weight Bearing Restrictions: No   Prior Activity Level Community (5-7x/wk): Went out daily.  worked FT as a Investment banker, operational at [employer].  Was driving.   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 [employer]  Current Functional Level Cognition  Arousal/Alertness: Awake/alert Overall Cognitive Status: Impaired/Different from baseline Current Attention Level: Focused Orientation Level: Oriented to person Following Commands: Follows one step commands inconsistently, Follows one step commands with increased time Safety/Judgement: Decreased awareness of deficits, Decreased awareness of safety General Comments: Still demonstrates L sided neglect, worked on acknowledgement of L side, pt able to read clock accurately, however not oriented to date "the clock has the wrong date" Attention: Sustained, Focused Focused Attention: Appears intact Sustained Attention: Impaired Sustained Attention Impairment: Verbal basic, Functional basic Awareness:  Impaired Awareness Impairment: Intellectual impairment (decreased awareness to decreased left attention and delayed responses) Problem Solving: Impaired (pt needed assistance to locate call bell - ? fatigue factor) Problem Solving Impairment: Verbal basic, Functional basic Safety/Judgment: Impaired    Extremity Assessment (includes Sensation/Coordination)  Upper Extremity Assessment: LUE deficits/detail LUE Deficits / Details: flexion pattern with movement ( elbow flexion, wrist flexion, digit flexion) pt not moving L UE outside of flexion pattern this session LUE Sensation: decreased light touch, decreased proprioception LUE Coordination: decreased fine motor, decreased gross motor  Lower Extremity Assessment: Defer to PT evaluation LLE Deficits / Details: with static sitting pt with extensor tone ( knee extended and ankle plantar flexion)    ADLs  Overall ADL's : Needs assistance/impaired Eating/Feeding: Minimal assistance, Sitting Eating/Feeding Details (indicate cue type and reason): cues for swallowing precautions Grooming: Wash/dry face, Minimal assistance, Sitting Grooming Details (indicate cue type and reason): (A) for balance sitting supported eob with Max (A) Upper Body Bathing: Moderate assistance, Bed level Lower Body Bathing: Total assistance General ADL Comments: Pt required (A) to complete bed mobility and maintain eob sitting. pt completed weight shifting in sitting onto BIL UE . Pt able to tolerate well initially . pt provided verbal cue that standing was going to occurr. Pt demonstrates immediate L UE flexion synergy and L LE extension. Transfer did not occurr this session due to DME not present  and extensor tone unsafe to attempt with +2 person (A)    Mobility  Overal bed mobility: +2 for physical assistance, Needs Assistance Bed Mobility: Supine to Sit, Sit to Supine Rolling: Mod assist, +2 for physical assistance, +2 for safety/equipment Supine to sit: +2 for  physical assistance, Mod assist Sit to supine: +2 for physical assistance, Mod assist General bed mobility comments: Unable to use L LE functionally, one persn for torso management, one person for leg management.  Pt unable to problem solve with bed mobility, did not initiate help with R UE    Transfers  Overall transfer level: Needs assistance Equipment used: None Transfers: Sit to/from Stand Sit to Stand: +2 physical assistance, Max assist General transfer comment: 2 person lift with gait belt and bed pad.  Unable to achieve full standing today, pt did not respond to verbal or tactile cues for LE extension    Ambulation / Gait / Stairs / Wheelchair Mobility  Ambulation/Gait General Gait Details: Unable to assess due to BP    Posture / Balance Dynamic Sitting Balance Sitting balance - Comments: Pt needs max assist to maintain seated balance, with therapist sitting on L side to provide proprioceptive cueing  Balance Overall balance assessment: Needs assistance Sitting-balance support: Single extremity supported Sitting balance-Leahy Scale: Poor Sitting balance - Comments: Pt needs max assist to maintain seated balance, with therapist sitting on L side to provide proprioceptive cueing  Postural control: Right lateral lean    Special needs/care consideration BiPAP/CPAP No CPM No Continuous Drip IV 0.9% NS 50 ml/hr Dialysis No         Life Vest No Oxygen No Special Bed No Trach Size No Wound Vac (area) No     Skin No                             Bowel mgmt: Last BM 11/03/14 loose and incontinent of stool Bladder mgmt: Voiding WDL Diabetic mgmt No    Previous Home Environment Living Arrangements: Spouse/significant other Available Help at Discharge: Family Type of Home: Apartment Home Care Services: No Additional Comments: Unable to get detailed history  Discharge Living Setting Plans for Discharge Living Setting: Lives with (comment), Apartment (Lives with wife.) Type of  Home at Discharge: Apartment Discharge Home Layout: One level Discharge Home Access: Level entry Does the patient have any problems obtaining your medications?: Yes (Describe)  Social/Family/Support Systems Patient Roles: Spouse, Parent (Has a wife and 3 children ages 6311, 3626 and 2828.) Contact Information: Ascencion DikeSherivian Self - wife [phone] Anticipated Caregiver: wife and 47 yo daughter Ability/Limitations of Caregiver: Wife works PT at [employer] and is a LawyerCNA.  Dtr works nights. Caregiver Availability: Other (Comment) (Wife understands the likely need for 24/7 supervision/care.) Discharge Plan Discussed with Primary Caregiver: Yes Is Caregiver In Agreement with Plan?: Yes Does Caregiver/Family have Issues with Lodging/Transportation while Pt is in Rehab?: No  Goals/Additional Needs Patient/Family Goal for Rehab: PT min assist, OT/ST from supervision to mod assist goals Expected length of stay: 20-28 days Cultural Considerations: None Dietary Needs: Dys 3, thin liquids Equipment Needs: TBD Pt/Family Agrees to Admission and willing to participate: Yes (Spoke with wife about rehab admission.) Program Orientation Provided & Reviewed with Pt/Caregiver Including Roles  & Responsibilities: Yes  Decrease burden of Care through IP rehab admission: N/A  Possible need for SNF placement upon discharge: Yes, if patient does not progress to where family can manager at home or if family  cannot provide level of care needed at discharge.  Patient Condition: This patient's medical and functional status has changed since the consult dated: 11/03/14 in which the Rehabilitation Physician determined and documented that the patient's condition is appropriate for intensive rehabilitative care in an inpatient rehabilitation facility. See "History of Present Illness" (above) for medical update. Functional changes are: Currently requiring max assist +2 for transfers. Patient's medical and functional status update  has been discussed with the Rehabilitation physician and patient remains appropriate for inpatient rehabilitation. Will admit to inpatient rehab today.  Preadmission Screen Completed By:  Trish Mage, 11/05/2014 10:53 AM ______________________________________________________________________   Discussed status with Dr. Wynn Banker on 11/05/14 at 1056 and received telephone approval for admission today.  Admission Coordinator:  Trish Mage, time1056/Date04/14/16

## 2014-11-04 NOTE — Progress Notes (Signed)
Speech Language Pathology Treatment: Cognitive-Linquistic  Patient Details Name: [name] Short MRN: [id number] DOB: 05-24-1966 Today's Date: 11/04/2014 Time: [id number] SLP Time Calculation (min) (ACUTE ONLY): 29 min  Assessment / Plan / Recommendation Clinical Impression  Pt asleep upon SLP entrance to room but did awaken with verbal/tactile stimulation.  Pt is NPO for procedure today and therefore session focused on cognitive treatment *improving attention to left, sustained attention, socially appropriate compensation for delayed responses.    SLP advised pt to use cue to alert listener when he is processing information/question asked by visual cue.    Intermittent delay in responses to questions re: sports *his interest* - 5 -30 seconds.  At times, he required repetition of question.  [name] Short tended to turn his head to the right and close his eyes - ? Due to poor sleep/decreased attention/behavior.  Sustaining attention for 29 minute session required verbal, visual, tactile max cues overall.     SLP facilitated session by providing verbal cues to remind pt of need to attend to the left by providing written sign in room.  He demonstrated good memory of elevated blood pressure today, orientation to place/situation.  Moderate verbal cues required for pt to search on his bed to locate his cell phone, after which he immediately called his wife and informed her that she was "late".  Pt is demonstrating slow progression for cognitive linguistic skills and will benefit from ongoing SLP to decrease caregiver burden. Marland Kitchen.     HPI HPI: [name] Short is an 49 y.o. male history of HTN presenting with acute onset of left sided weakness. Per patient, he awoke normal at 0400, went to the bathroom, on the way back he fell due to weakness of his left side. Upon EMS arrival he was noted to have BP of 220/110 and appeared to be slumping to the left. They note his mental status appeared to fluctuate. Family reports  poor compliance with his home BP medications. [name] Short is an 49 y.o. male history of HTN presenting with acute onset of left sided weakness on 10/31/14; Per patient, he awoke normal at 0400, went to the bathroom, on the way back he fell due to weakness of his left side. Upon EMS arrival he was noted to have BP of 220/110 and appeared to be slumping to the left. They noted his mental status appeared to fluctuate. Family reports poor compliance with his home BP medications. MRI revealed on 10/31/14 Acute moderate size nonhemorrhagic right anterior cerebral artery; local mass effect   Pertinent Vitals Pain Assessment: Faces Pain Score: 0-No pain  SLP Plan  Continue with current plan of care    Recommendations  CIR              Follow up Recommendations: Inpatient Rehab Plan: Continue with current plan of care    GO     Donavan Burnetamara Launi Asencio, MS [facility] SLP [phone]

## 2014-11-04 NOTE — Progress Notes (Addendum)
Physical Therapy Treatment Patient Details Name: [name] Short MRN: [id number] DOB: Apr 22, 1966 Today's Date: 11/04/2014    History of Present Illness Pt presents with L sided weakness, s/p R frontal/parietal CVA.  PMH: Uncontrolled HTN, BP parameters <180/105    PT Comments    Pt tolerated sitting EOB for 15 minutes today with max assist.  Pt had difficulty performing sit to stand with max assist x2 today, and was unable to achieve full hip extension in standing. Pt had difficulty with problem solving,  following simple commands.  Pt still demonstrates L sided neglect.  Current plan for CIR remains appropriate.  Pt would benefit from continued skilled therapy to address remaining deficits.     Follow Up Recommendations  CIR     Equipment Recommendations  None recommended by PT    Recommendations for Other Services       Precautions / Restrictions Precautions Precautions: Fall Restrictions Weight Bearing Restrictions: No    Mobility  Bed Mobility Overal bed mobility: +2 for physical assistance;Needs Assistance Bed Mobility: Supine to Sit;Sit to Supine     Supine to sit: +2 for physical assistance;Mod assist Sit to supine: +2 for physical assistance;Mod assist   General bed mobility comments: Unable to use L LE functionally, one persn for torso management, one person for leg management.  Pt unable to problem solve with bed mobility, did not initiate help with R UE  Transfers Overall transfer level: Needs assistance Equipment used: None Transfers: Sit to/from Stand Sit to Stand: +2 physical assistance;Max assist         General transfer comment: 2 person lift with gait belt and bed pad.  Unable to achieve full standing today, pt did not respond to verbal or tactile cues for full extension of BLE   Ambulation/Gait                 Stairs            Wheelchair Mobility    Modified Rankin (Stroke Patients Only) Modified Rankin (Stroke Patients  Only) Pre-Morbid Rankin Score: No symptoms Modified Rankin: Severe disability     Balance Overall balance assessment: Needs assistance Sitting-balance support: Single extremity supported Sitting balance-Leahy Scale: Poor Sitting balance - Comments: Pt needs max assist to maintain seated balance, with therapist sitting on L side to provide proprioceptive cueing  Postural control: Right lateral lean                          Cognition Arousal/Alertness: Lethargic Behavior During Therapy: Flat affect Overall Cognitive Status: Impaired/Different from baseline Area of Impairment: Attention;Awareness;Safety/judgement;Following commands;Problem solving   Current Attention Level: Focused Memory: Decreased recall of precautions;Decreased short-term memory Following Commands: Follows one step commands inconsistently;Follows one step commands with increased time Safety/Judgement: Decreased awareness of deficits;Decreased awareness of safety Awareness: Intellectual Problem Solving: Slow processing;Decreased initiation;Difficulty sequencing;Requires verbal cues;Requires tactile cues (Unable to place phone safely on table) General Comments: Still demonstrates L sided neglect, worked on acknowledgement of L side, pt able to read clock accurately, however not oriented to date "the clock has the wrong date"    Exercises      General Comments General comments (skin integrity, edema, etc.): Pt demonstrated increased difficulty with problem solving and executive functioning today, including difficulty with sequencing and following commands      Pertinent Vitals/Pain Pain Assessment: Faces Faces Pain Scale: Hurts little more Pain Descriptors / Indicators: Grimacing Pain Intervention(s): Monitored during session    Home Living  Prior Function            PT Goals (current goals can now be found in the care plan section) Progress towards PT goals:  Progressing toward goals    Frequency  Min 4X/week    PT Plan Current plan remains appropriate    Co-evaluation             End of Session Equipment Utilized During Treatment: Gait belt Activity Tolerance: Patient tolerated treatment well (BP 129/83) Patient left: in bed;with call bell/phone within reach;with bed alarm set;with family/visitor present     Time: 1420-1455 PT Time Calculation (min) (ACUTE ONLY): 35 min  Charges:  $Therapeutic Activity: 8-22 mins $Neuromuscular Re-education: 8-22 mins                    G Codes:      Shallyn Constancio 11/04/2014, 4:33 PM  Vella RaringKailee Kahliya Fraleigh, SPT (student physical therapist) Office phone: [phone]

## 2014-11-05 ENCOUNTER — Encounter (HOSPITAL_COMMUNITY): Payer: Self-pay | Admitting: Nurse Practitioner

## 2014-11-05 ENCOUNTER — Inpatient Hospital Stay (HOSPITAL_COMMUNITY)
Admission: RE | Admit: 2014-11-05 | Discharge: 2014-12-03 | DRG: 057 | Disposition: A | Discharge status: Home Health | Payer: Medicaid Other | Source: Intra-hospital | Attending: Physical Medicine & Rehabilitation | Admitting: Physical Medicine & Rehabilitation

## 2014-11-05 ENCOUNTER — Telehealth: Payer: Self-pay | Admitting: Nurse Practitioner

## 2014-11-05 DIAGNOSIS — Z79899 Other long term (current) drug therapy: Secondary | ICD-10-CM | POA: Diagnosis not present

## 2014-11-05 DIAGNOSIS — I69354 Hemiplegia and hemiparesis following cerebral infarction affecting left non-dominant side: Secondary | ICD-10-CM | POA: Diagnosis present

## 2014-11-05 DIAGNOSIS — I429 Cardiomyopathy, unspecified: Secondary | ICD-10-CM

## 2014-11-05 DIAGNOSIS — R339 Retention of urine, unspecified: Secondary | ICD-10-CM

## 2014-11-05 DIAGNOSIS — R197 Diarrhea, unspecified: Secondary | ICD-10-CM

## 2014-11-05 DIAGNOSIS — I10 Essential (primary) hypertension: Secondary | ICD-10-CM

## 2014-11-05 DIAGNOSIS — E785 Hyperlipidemia, unspecified: Secondary | ICD-10-CM

## 2014-11-05 DIAGNOSIS — B9620 Unspecified Escherichia coli [E. coli] as the cause of diseases classified elsewhere: Secondary | ICD-10-CM | POA: Diagnosis not present

## 2014-11-05 DIAGNOSIS — N390 Urinary tract infection, site not specified: Secondary | ICD-10-CM | POA: Diagnosis not present

## 2014-11-05 DIAGNOSIS — Z87891 Personal history of nicotine dependence: Secondary | ICD-10-CM | POA: Diagnosis not present

## 2014-11-05 DIAGNOSIS — N319 Neuromuscular dysfunction of bladder, unspecified: Secondary | ICD-10-CM | POA: Diagnosis not present

## 2014-11-05 DIAGNOSIS — E876 Hypokalemia: Secondary | ICD-10-CM | POA: Diagnosis present

## 2014-11-05 DIAGNOSIS — Z720 Tobacco use: Secondary | ICD-10-CM

## 2014-11-05 DIAGNOSIS — I63521 Cerebral infarction due to unspecified occlusion or stenosis of right anterior cerebral artery: Secondary | ICD-10-CM | POA: Diagnosis not present

## 2014-11-05 DIAGNOSIS — I6931 Cognitive deficits following cerebral infarction: Secondary | ICD-10-CM | POA: Diagnosis not present

## 2014-11-05 DIAGNOSIS — R1310 Dysphagia, unspecified: Secondary | ICD-10-CM | POA: Diagnosis not present

## 2014-11-05 DIAGNOSIS — G8190 Hemiplegia, unspecified affecting unspecified side: Secondary | ICD-10-CM | POA: Diagnosis not present

## 2014-11-05 DIAGNOSIS — I69391 Dysphagia following cerebral infarction: Secondary | ICD-10-CM | POA: Diagnosis not present

## 2014-11-05 DIAGNOSIS — I69319 Unspecified symptoms and signs involving cognitive functions following cerebral infarction: Secondary | ICD-10-CM

## 2014-11-05 DIAGNOSIS — R414 Neurologic neglect syndrome: Secondary | ICD-10-CM | POA: Diagnosis not present

## 2014-11-05 DIAGNOSIS — F1290 Cannabis use, unspecified, uncomplicated: Secondary | ICD-10-CM

## 2014-11-05 DIAGNOSIS — F17210 Nicotine dependence, cigarettes, uncomplicated: Secondary | ICD-10-CM | POA: Diagnosis present

## 2014-11-05 DIAGNOSIS — R7989 Other specified abnormal findings of blood chemistry: Secondary | ICD-10-CM | POA: Diagnosis not present

## 2014-11-05 DIAGNOSIS — G8194 Hemiplegia, unspecified affecting left nondominant side: Secondary | ICD-10-CM

## 2014-11-05 DIAGNOSIS — N3000 Acute cystitis without hematuria: Secondary | ICD-10-CM | POA: Diagnosis not present

## 2014-11-05 DIAGNOSIS — F1210 Cannabis abuse, uncomplicated: Secondary | ICD-10-CM | POA: Diagnosis present

## 2014-11-05 LAB — CREATININE, SERUM
Creatinine, Ser: 1.18 mg/dL (ref 0.50–1.35)
GFR, EST AFRICAN AMERICAN: 83 mL/min — AB (ref >90)
GFR, EST NON AFRICAN AMERICAN: 72 mL/min — AB (ref >90)

## 2014-11-05 LAB — CBC
HEMATOCRIT: 42.8 % (ref 39.0–52.0)
HEMOGLOBIN: 14.1 g/dL (ref 13.0–17.0)
MCH: 29.9 pg (ref 26.0–34.0)
MCHC: 32.9 g/dL (ref 30.0–36.0)
MCV: 90.9 fL (ref 78.0–100.0)
Platelets: 178 10*3/uL (ref 150–400)
RBC: 4.71 MIL/uL (ref 4.22–5.81)
RDW: 14.2 % (ref 11.5–15.5)
WBC: 6.4 10*3/uL (ref 4.0–10.5)

## 2014-11-05 LAB — CLOSTRIDIUM DIFFICILE BY PCR: Toxigenic C. Difficile by PCR: NEGATIVE

## 2014-11-05 MED ORDER — PANTOPRAZOLE SODIUM 40 MG PO TBEC
40.0000 mg | DELAYED_RELEASE_TABLET | Freq: Every day | ORAL | Status: DC
Start: 2014-11-05 — End: 2014-12-03
  Administered 2014-11-05 – 2014-12-02 (×28): 40 mg via ORAL
  Filled 2014-11-05 (×28): qty 1

## 2014-11-05 MED ORDER — ENOXAPARIN SODIUM 40 MG/0.4ML ~~LOC~~ SOLN: 40.0000 mg | SUBCUTANEOUS | Status: DC

## 2014-11-05 MED ORDER — LISINOPRIL 40 MG PO TABS
40.0000 mg | ORAL_TABLET | Freq: Every day | ORAL | Status: DC
Start: 2014-11-06 — End: 2014-11-25
  Administered 2014-11-06 – 2014-11-25 (×20): 40 mg via ORAL
  Filled 2014-11-05 (×22): qty 1

## 2014-11-05 MED ORDER — ASPIRIN EC 81 MG PO TBEC
81.0000 mg | DELAYED_RELEASE_TABLET | Freq: Every day | ORAL | Status: DC
  Administered 2014-11-06 – 2014-12-03 (×28): 81 mg via ORAL
  Filled 2014-11-05 (×30): qty 1

## 2014-11-05 MED ORDER — CARVEDILOL 12.5 MG PO TABS
12.5000 mg | ORAL_TABLET | Freq: Two times a day (BID) | ORAL | Status: DC
  Administered 2014-11-05 – 2014-12-03 (×56): 12.5 mg via ORAL
  Filled 2014-11-05 (×60): qty 1

## 2014-11-05 MED ORDER — ACETAMINOPHEN 650 MG RE SUPP: 650.0000 mg | RECTAL | Status: DC | PRN

## 2014-11-05 MED ORDER — ONDANSETRON HCL 4 MG/2ML IJ SOLN: 4.0000 mg | Freq: Four times a day (QID) | INTRAMUSCULAR | Status: DC | PRN

## 2014-11-05 MED ORDER — ONDANSETRON HCL 4 MG PO TABS
4.0000 mg | ORAL_TABLET | Freq: Four times a day (QID) | ORAL | Status: DC | PRN
Start: 2014-11-05 — End: 2014-12-03

## 2014-11-05 MED ORDER — ACETAMINOPHEN 325 MG PO TABS
650.0000 mg | ORAL_TABLET | ORAL | Status: DC | PRN
  Administered 2014-11-06 – 2014-11-26 (×16): 650 mg via ORAL
  Filled 2014-11-05 (×19): qty 2

## 2014-11-05 MED ORDER — PANTOPRAZOLE SODIUM 40 MG PO TBEC: 40.0000 mg | DELAYED_RELEASE_TABLET | Freq: Every day | ORAL | Status: DC

## 2014-11-05 MED ORDER — AMLODIPINE BESYLATE 10 MG PO TABS
10.0000 mg | ORAL_TABLET | Freq: Every day | ORAL | Status: DC
  Administered 2014-11-06 – 2014-12-03 (×28): 10 mg via ORAL
  Filled 2014-11-05 (×30): qty 1

## 2014-11-05 MED ORDER — CLOPIDOGREL BISULFATE 75 MG PO TABS
75.0000 mg | ORAL_TABLET | Freq: Every day | ORAL | Status: DC
  Administered 2014-11-06 – 2014-12-03 (×28): 75 mg via ORAL
  Filled 2014-11-05 (×31): qty 1

## 2014-11-05 MED ORDER — SORBITOL 70 % SOLN: 30.0000 mL | Freq: Every day | Status: DC | PRN

## 2014-11-05 MED ORDER — ATORVASTATIN CALCIUM 40 MG PO TABS
40.0000 mg | ORAL_TABLET | Freq: Every day | ORAL | Status: DC
  Administered 2014-11-05 – 2014-12-02 (×28): 40 mg via ORAL
  Filled 2014-11-05 (×31): qty 1

## 2014-11-05 MED ORDER — ENOXAPARIN SODIUM 40 MG/0.4ML ~~LOC~~ SOLN
40.0000 mg | SUBCUTANEOUS | Status: DC
  Administered 2014-11-05 – 2014-12-02 (×27): 40 mg via SUBCUTANEOUS
  Filled 2014-11-05 (×29): qty 0.4

## 2014-11-05 NOTE — Progress Notes (Signed)
Patient with three loose stools. C.diff protocol initiated. Family at bedside aware. Stool sample sent. Awaiting for result. Will continue to monitor.  Sim BoastHavy, RN

## 2014-11-05 NOTE — Progress Notes (Signed)
Stool sample negative at this time. Precaution d/c'ed.   Sim BoastHavy, RN

## 2014-11-05 NOTE — Progress Notes (Signed)
Patient ID: [name] Short, male   DOB: 08/09/1965, 49 y.o.   MRN: [id number] Patient arrived from 4N via bed with belongings. Patient oriented to room, rehab process, rehab schedule, fall prevention plan, rehab safety plan, and call bell system. Patient resting comfortably in bed with no complaints of pain at this time. Bed alarm on and 3 side rails up for safety.

## 2014-11-05 NOTE — Discharge Summary (Signed)
Stroke Discharge Summary  Patient ID: [name] Short   MRN: [id number]      DOB: Jun 25, 1966  Date of Admission: 10/31/2014 Date of Discharge: 11/05/2014  Attending Physician:  Marvel Plan, MD, Stroke MD  Consulting Physician(s):    Faith Rogue, MD (Physical Medicine & Rehabtilitation)   Patient's PCP:  No primary care provider on file.  Discharge Diagnoses:  Principal Problem:   Acute right anterior cerebral artery (ACA) ischemic stroke s/p IV tPA. Embolic d/t unknown source Active Problems:   Left-sided weakness   Secondary cardiomyopathy   Malignant hypertension   Hyperlipidemia LDL goal <70   Cigarette smoker   Marijuana abuse   Hypokalemia  Past Medical History  Diagnosis Date  . Hypertension     a. Previously on meds but none in several years.  . Smoker     a. Previous tobacco - quit ~ 2013. Ongoing daily marijuana usage. Occasional cigar.  . Stroke     a. 10/2014 MRI Head: acute mod size nonhemorrhagic R ant cerebral artery distribution infarct involving the medial aspect of the right frontal and parietal lobe and right aspect of the corpus callosum w/ local mass effect-->TPA;  b. 10/2014 MRA: Abrupt cut off of the R ACA A2 segment;  b. 10/2014 Carotid U/S: 1-39% bilat ICA stenosis.  . Cardiomyopathy     a. 10/2014 Echo: EF 35-40%, diff HK, sev LVH, mod dil LA, PASP .   Past Surgical History  Procedure Laterality Date  . Tee without cardioversion N/A 11/03/2014    Procedure: TRANSESOPHAGEAL ECHOCARDIOGRAM (TEE);  Surgeon: Laurey Morale, MD;  Location: [facility] ENDOSCOPY;  Service: Cardiovascular;  Laterality: N/A;    Medications to be continued on Rehab . amLODipine  10 mg Oral Daily  . aspirin EC  81 mg Oral Daily  . atorvastatin  40 mg Oral q1800  . carvedilol  12.5 mg Oral BID WC  . clopidogrel  75 mg Oral Daily  . enoxaparin (LOVENOX) injection  40 mg Subcutaneous Q24H  . lisinopril  40 mg Oral Daily  . pantoprazole  40 mg Oral QHS    LABORATORY  STUDIES CBC    Component Value Date/Time   WBC 6.1 11/04/2014 0437   RBC 4.76 11/04/2014 0437   HGB 14.1 11/04/2014 0437   HCT 43.6 11/04/2014 0437   PLT 188 11/04/2014 0437   MCV 91.6 11/04/2014 0437   MCH 29.6 11/04/2014 0437   MCHC 32.3 11/04/2014 0437   RDW 14.3 11/04/2014 0437   LYMPHSABS 1.1 10/31/2014 0501   MONOABS 0.5 10/31/2014 0501   EOSABS 0.5 10/31/2014 0501   BASOSABS 0.0 10/31/2014 0501   CMP    Component Value Date/Time   NA 136 11/04/2014 0437   K 3.7 11/04/2014 0437   CL 105 11/04/2014 0437   CO2 23 11/04/2014 0437   GLUCOSE 89 11/04/2014 0437   BUN 10 11/04/2014 0437   CREATININE 1.23 11/04/2014 0437   CALCIUM 8.6 11/04/2014 0437   PROT 7.7 10/31/2014 0501   ALBUMIN 3.2* 10/31/2014 0501   AST 38* 10/31/2014 0501   ALT 16 10/31/2014 0501   ALKPHOS 69 10/31/2014 0501   BILITOT 0.7 10/31/2014 0501   GFRNONAA 68* 11/04/2014 0437   GFRAA 79* 11/04/2014 0437   COAGS Lab Results  Component Value Date   INR 1.28 10/31/2014   Lipid Panel    Component Value Date/Time   CHOL 164 11/01/2014 0601   TRIG 72 11/01/2014 0601   HDL 28* 11/01/2014  0601   CHOLHDL 5.9 11/01/2014 0601   VLDL 14 11/01/2014 0601   LDLCALC 122* 11/01/2014 0601   HgbA1C  Lab Results  Component Value Date   HGBA1C 5.8* 11/01/2014   Cardiac Panel (last 3 results) No results for input(s): CKTOTAL, CKMB, TROPONINI, RELINDX in the last 72 hours. Urinalysis    Component Value Date/Time   COLORURINE YELLOW 10/31/2014 0624   APPEARANCEUR CLEAR 10/31/2014 0624   LABSPEC 1.019 10/31/2014 0624   PHURINE 6.5 10/31/2014 0624   GLUCOSEU NEGATIVE 10/31/2014 0624   HGBUR NEGATIVE 10/31/2014 0624   BILIRUBINUR NEGATIVE 10/31/2014 0624   KETONESUR NEGATIVE 10/31/2014 0624   PROTEINUR 30* 10/31/2014 0624   UROBILINOGEN 1.0 10/31/2014 0624   NITRITE NEGATIVE 10/31/2014 0624   LEUKOCYTESUR NEGATIVE 10/31/2014 0624   Urine Drug Screen     Component Value Date/Time   LABOPIA NONE  DETECTED 10/31/2014 0624   COCAINSCRNUR NONE DETECTED 10/31/2014 0624   LABBENZ NONE DETECTED 10/31/2014 0624   AMPHETMU NONE DETECTED 10/31/2014 0624   THCU POSITIVE* 10/31/2014 0624   LABBARB NONE DETECTED 10/31/2014 0624    Alcohol Level    Component Value Date/Time   ETH <5 10/31/2014 0500     SIGNIFICANT DIAGNOSTIC STUDIES  Ct Head Wo Contrast 10/31/2014 No acute intracranial abnormalities. Mild chronic small vessel ischemic changes. Old lacunar infarct in the pons.   MRI HEAD 11/01/2014 Acute moderate size nonhemorrhagic right anterior cerebral artery distribution infarct involving the medial aspect of the right frontal and parietal lobe and right aspect of the corpus callosum. Local mass effect. Tiny blood breakdown products cerebellum bilaterally may reflect result of prior hemorrhagic ischemia or prior trauma. Moderate white matter type changes periventricular region advanced for patient's age and most likely related to result of small vessel disease in this hypertensive obese patient. Pontine altered signal intensity may represent prominent perivascular spaces or result of prior small infarct. Mild mucosal thickening paranasal sinuses most notable inferior right maxillary sinus with maximal thickness of 5 mm.   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 Irregularity of the A2 segment of the anterior cerebral bilaterally with abrupt cut off of flow of the A2 segment of the right anterior cerebral artery 1.5 cm above its origin consistent with patient's acute right anterior cerebral artery distribution infarct. There is a small bulge at the left anterior cerebral artery A1-A2 junction which may represent origin of a small vessel although difficult to completely exclude a a tiny aneurysm. The mild motion degradation somewhat limits evaluation on source images.  Middle cerebral artery mild to moderate branch vessel irregularity with narrowing. Posterior fossa atherosclerotic type changes as detailed above. Electronically Signed By: Lacy Duverney M.D. On: 11/01/2014 09:36   Carotid Doppler There is 1-39% bilateral ICA stenosis. Vertebral artery flow is antegrade.   Bilateral lower extremity venous Dopplers There is no DVT or SVT noted in the bilateral lower extremities.   2D Echocardiogram  - Left ventricle: The cavity size was mildly dilated. Wallthickness was increased in a pattern of severe LVH. Systolicfunction was moderately reduced. The estimated ejection fractionwas in the range of 35% to 40%. Diffuse hypokinesis. Dopplerparameters are consistent with elevated ventricular end-diastolicfilling pressure. - Left atrium: The atrium was moderately dilated. - Atrial septum: No defect or patent foramen ovale was identified. - Pulmonary arteries: PA peak pressure: 45 mm Hg (S). Impressions: In the absence of HTN or renal failure  cannot r/oamyloid.  TEE - No definite source of embolus. EF 35% with moderate LVH and smoke but no thrombus in LA appendage.   Renal artery duplex - RRA/AO ratio: 1.90  LRA/AO ratio: 1.24 No evidence of renal artery stenosis bilaterally. No AAA or renal cysts/ masses seen bilaterally.     HISTORY OF PRESENT ILLNES  [name] Short is an 49 y.o. male history of HTN presenting with acute onset of left sided weakness. Per patient, he awoke normal at 0400 on 10/31/2014 (LKW), went to the bathroom, on the way back he fell due to weakness of his left side. Upon EMS arrival he was noted to have BP of 220/110 and appeared to be slumping to the left. They note his mental status appeared to fluctuate. Family reports poor compliance with his home BP medications. CT head imaging reviewed and no acute process. Due to inconsistent nature of exam he was taken for a DWI which showed acute  infarct. Initial NIHSS of 7. tPA was given 10/31/2014 at 0646. There was a delay as patient had refractory hypertension requiring multiple doses of IV labetalol and a cardene drip. He was admitted for further evaluation and treatment.   HOSPITAL COURSE Stroke: Non-dominant right ACA infarct s/p IV tPA, infarct embolic secondary to unknown source  Resultant Left hemiparesis, abulia   MRI Right ACA infarct  MRA right A2 occlusion  Carotid Doppler No significant stenosis  2D Echo EF 35-40%. Hypokinesis. No obvious embolus.  TEE No definite source of embolus. EF 35% with moderate LVH and smoke but no thrombus in LA appendage.  Does not recommend implantable loop recorder due to past noncompliance and concern for noncompliance with monitoring and anticoagulation if needed. OP 30 day monitor recommended. Also OP cardiology consult recommended for low EF.  HgbA1c 5.8  hypercoagulable work up resulted thus far negative. Pending tests need follow up.  Lovenox 40 mg sq daily for VTE prophylaxis  no antithrombotic prior to admission, given large vessel disease, placed on dual antiplatelet for 3 months and then Plavix alone.  Patient counseled to be compliant with his antithrombotic medications  Ongoing aggressive stroke risk factor management  Therapy recommendations: CIR.   Disposition: CIR (lives with girlfriend PTA)  Cardiomyopathy  Low EF 35-40%  Likely due to long-standing and controlled hypertension  OP cardiac follow up recommended by EP for both EF eval and BP management  Malignant Hypertension  Blood pressure 210/110 on arrival  Home meds: None  Increased lisinopril to 40, add amlodipine 10, increase Coreg to 12.5 bid.  Renal artery duplex - negative for renal artery stenosis   Malignant HTN labs pending.  Blood pressure goal - gradually normotensive  Patient counseled to be compliant with his blood pressure medications  OP cardiac follow up  recommended by EP for both EF eval and BP management  Hyperlipidemia  Home meds: No statin  LDL 122, goal < 70  Added statin, lipitor 40 daily  Continue statin at discharge  Other Stroke Risk Factors  Cigarette smoker, 1-2/day, advised to stop smoking  UDS positive for THC. Daily use per patient. Advised to stop.  Other Active Problems  Hypokalemia, 2.9->3.3>3.3. Supplement prescribed by cardiology   DISCHARGE EXAM Blood pressure 121/77, pulse 67, temperature 98.4 F (36.9 C), temperature source Oral, resp. rate 16, height  (1.778 m), weight 93 kg (205 lb 0.4 oz), SpO2 100 %.  General - obese, well developed, flat affect, abulia, not willing to participate in normal conversation.  Ophthalmologic -  not cooperative on exam  Cardiovascular - Regular rate and rhythm with no murmur.  Neuro - pt flat affect, abulia, not cooperative on exma, not engaged in normal conversation, eventually answered some questions appropriately. Able to repeat and only named 1/3. PERRL, left facial droop, tongue in middle, left UE distal 3/5, proximal 0/5, LLE 1/5. Reflex 1+ and babinski positive on the left. Sensation symmetrical. Coordination intact on the right hand. Gait not tested due to weakness.  Discharge Diet  DIET DYS 3 Fluid consistency:: Thin   DISCHARGE PLAN  Disposition:  [facility], OT and ST  aspirin 81 mg orally every day and clopidogrel 75 mg orally every day for secondary stroke prevention.  OP cardiac follow up recommended by EP for both EF eval and BP management  Recommend ongoing risk factor control by Primary Care Physician at time of discharge from inpatient rehabilitation.  Follow-up No primary care provider on file. in 2 weeks following discharge from rehab.  Follow-up with Dr. Marvel PlanJindong Floyed Masoud, Stroke Clinic in 2 months.   40 minutes were spent preparing discharge.  Rhoderick MoodyBIBY,SHARON  [facility] See Amion for  Pager information 11/05/2014 4:30 PM   I, the attending vascular neurologist, have personally obtained a history, examined the patient, evaluated laboratory data, individually viewed imaging studies and agree with radiology interpretations. I also discussed with pt's wife at bedside. Together with the NP/PA, we formulated the assessment and plan of care which reflects our mutual decision.  I have made any additions or clarifications directly to the above note and agree with the findings and plan as currently documented.   49 yo M with uncontrolled HTN presented with right ACA stroke. Although pt has risk factors for stroke but his stroke pattern can not rule out embolic. His flat affect and abulia likely due to the frontal lesion. TEE also showed EF 35% without thrombus. Patient not a candidate for loop recorder, may consider outpatient 30 day monitoring. Low EF also warrant for outpatient cardiology consult. Blood pressure is still uncontrolled, increasing BP meds. Renal artery duplex negative for renal artery stenosis. D/c to CIR and follow up in clinic.   Marvel PlanJindong Ishana Blades, MD PhD Stroke Neurology 11/05/2014 4:57 PM

## 2014-11-05 NOTE — H&P (Signed)
Physical Medicine and Rehabilitation Admission H&P   Chief Complaint  Patient presents with  . Code Stroke  : HPI: [name] Short is a 49 y.o.right handed male with history of hypertension as well as remote tobacco abuse. Patient on no scheduled medications upon admission. Patient independent prior to admission living with his girlfriend working at [employer]. Presented 10/31/2014 with left-sided weakness and altered mental status. Noted blood pressure 220/110. Urine drug screen positive for marijuana. MRI of the brain showed acute ischemic right ACA territory infarct. 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 Irregularity of the A2 segment of the anterior cerebral bilaterally with abrupt cut off of flow at the A2 segment of the right ACA 1.5 cm above its origin consistent with patient's acute right ACA distribution infarct. Carotid Dopplers were no ICA stenosis. Echocardiogram with ejection fraction 35-40% with diffuse hypokinesis. Venous Doppler studies lower extremities negative for DVT. Neurology consulted patient did receive TPA. Presently maintained on aspirin/Plavix for CVA prophylaxis as well as subcutaneous Lovenox for DVT prophylaxis. TEE showed ejection fraction of 35% without embolus or thrombus. No plan at this time for loop recorder and considering outpatient 30 day monitoring. Blood pressure has been monitored closely as initially patient did receive Cardene drip. A renal artery duplex was completed showing no evidence of renal artery stenosis bilaterally. Speech therapy follow-up noted poor attention to the left as well as limited awareness of deficits. He is tolerating a mechanical soft diet. Physical and Occupational therapy evaluations completed with recommendations of physical medicine rehabilitation consult Fianc at bedside, she is wondering whether he  can walk again, we discussed that his rib rotation process will be lengthy but based on lesion location and severity, do not expect normal ambulation.  ROS Review of Systems  Gastrointestinal: Positive for constipation.  Neurological: Positive for weakness.  All other systems reviewed and are negative   Past Medical History  Diagnosis Date  . Hypertension     a. Previously on meds but none in several years.  . Smoker     a. Previous tobacco - quit ~ 2013. Ongoing daily marijuana usage. Occasional cigar.  . Stroke     a. 10/2014 MRI Head: acute mod size nonhemorrhagic R ant cerebral artery distribution infarct involving the medial aspect of the right frontal and parietal lobe and right aspect of the corpus callosum w/ local mass effect-->TPA; b. 10/2014 MRA: Abrupt cut off of the R ACA A2 segment; b. 10/2014 Carotid U/S: 1-39% bilat ICA stenosis.  . Cardiomyopathy     a. 10/2014 Echo: EF 35-40%, diff HK, sev LVH, mod dil LA, PASP 12mHg.   History reviewed. No pertinent past surgical history. Family History  Problem Relation Age of Onset  . Other      no premature cad   Social History:  reports that he has been smoking Cigars. He does not have any smokeless tobacco history on file. He reports that he drinks alcohol. He reports that he uses illicit drugs. Allergies: No Known Allergies Medications Prior to Admission  Medication Sig Dispense Refill  . HYDROCODONE-ACETAMINOPHEN PO Take 1 tablet by mouth once.      Home: Home Living Family/patient expects to be discharged to:: Private residence Living Arrangements: Spouse/significant other Available Help at Discharge: Family Type of Home: Apartment Additional Comments: Unable to get detailed history  Functional History: Prior Function Level of Independence: Independent  Comments: Worked as a Training and development officer at [employer]:  Mobility: Bed Mobility Overal bed  mobility: +2 for physical assistance, Needs Assistance Bed Mobility: Rolling, Supine to Sit, Sit to Supine Rolling: Mod assist, +2 for physical assistance, +2 for safety/equipment Supine to sit: +2 for physical assistance, Max assist, +2 for safety/equipment Sit to supine: Mod assist, +2 for physical assistance, +2 for safety/equipment General bed mobility comments: unable to use L UE functionally, assist for L UE/LE for rolling for hygiene Transfers Overall transfer level: Needs assistance Equipment used: None Transfers: Sit to/from Stand Sit to Stand: +2 physical assistance, Max assist General transfer comment: 2 person lift with gait belt and bed pad. able to achieve full standing with L LE blocked and bed pad used to achieve full hip extension for upright posture and tactile cues at chest  Ambulation/Gait General Gait Details: Unable to assess due to BP    ADL: ADL Overall ADL's : Needs assistance/impaired Eating/Feeding: Minimal assistance, Sitting Eating/Feeding Details (indicate cue type and reason): cues for swallowing precautions Grooming: Wash/dry face, Minimal assistance, Sitting Grooming Details (indicate cue type and reason): (A) for balance sitting supported eob with Max (A) Upper Body Bathing: Moderate assistance, Bed level Lower Body Bathing: Total assistance General ADL Comments: Pt required (A) to complete bed mobility and maintain eob sitting. pt completed weight shifting in sitting onto BIL UE . Pt able to tolerate well initially . pt provided verbal cue that standing was going to occurr. Pt demonstrates immediate L UE flexion synergy and L LE extension. Transfer did not occurr this session due to DME not present and extensor tone unsafe to attempt with +2 person (A)  Cognition: Cognition Overall Cognitive Status: Impaired/Different from baseline Arousal/Alertness: Awake/alert Orientation Level: Oriented to person, Oriented to place Attention: Sustained,  Focused Focused Attention: Appears intact Sustained Attention: Impaired Sustained Attention Impairment: Verbal basic, Functional basic Awareness: Impaired Awareness Impairment: Intellectual impairment (decreased awareness to decreased left attention and delayed responses) Problem Solving: Impaired (pt needed assistance to locate call bell - ? fatigue factor) Problem Solving Impairment: Verbal basic, Functional basic Safety/Judgment: Impaired Cognition Arousal/Alertness: Awake/alert Behavior During Therapy: Flat affect Overall Cognitive Status: Impaired/Different from baseline Area of Impairment: Attention, Awareness, Safety/judgement, Following commands, Problem solving Current Attention Level: Sustained Memory: Decreased recall of precautions, Decreased short-term memory Following Commands: Follows one step commands with increased time, Follows one step commands inconsistently Safety/Judgement: Decreased awareness of deficits, Decreased awareness of safety Awareness: Intellectual Problem Solving: Difficulty sequencing, Slow processing, Decreased initiation, Requires verbal cues, Requires tactile cues General Comments: L sided neglect however more attentive this date  Physical Exam: Blood pressure 178/107, pulse 71, temperature 98.3 F (36.8 C), temperature source Oral, resp. rate 18, height 5' 10" (1.778 m), weight 93 kg (205 lb 0.4 oz), SpO2 98 %. Physical Exam Constitutional: He appears well-developed.  Eyes:  Pupils round and reactive to light  Neck: Normal range of motion. Neck supple. No thyromegaly present.  Cardiovascular: Normal rate and regular rhythm.  Respiratory: Effort normal and breath sounds normal. No respiratory distress.  GI: Soft. Bowel sounds are normal. He exhibits no distension. There is no tenderness.  Musculoskeletal: He exhibits no edema.  Neurological: He is alert.  Left-sided inattention. He was able to state his name but needed multiple cues for  place. Needing cues to follow basic directions. Flexor synergy pattern LUE and extensor pattern in lower. Has difficulty initiating movements with left. Slight sense of pain. Fair sitting balance. Left central 7  and tongue deviation  Skin: Skin is warm and dry.  Psychiatric:  flat  Motor strength is 5/5 in the right deltoid, biceps, triceps, grip, hip flexor, knee extensor, ankle dorsi flexion plantar flexor 2 minus in the left finger flexors and finger extensors trace at the biceps triceps 0 at the deltoid 0/5 in the left hip flexor and extensor ankle dorsiflexor and plantar flexor Absent sensation in the left lower extremity, reduced sensation left upper extremity Normal sensation on the right side in the upper and lower limb    Lab Results Last 48 Hours    Results for orders placed or performed during the hospital encounter of 10/31/14 (from the past 48 hour(s))  CBC Status: None   Collection Time: 11/03/14 7:11 AM  Result Value Ref Range   WBC 5.1 4.0 - 10.5 K/uL   RBC 4.45 4.22 - 5.81 MIL/uL   Hemoglobin 13.3 13.0 - 17.0 g/dL   HCT 40.9 39.0 - 52.0 %   MCV 91.9 78.0 - 100.0 fL   MCH 29.9 26.0 - 34.0 pg   MCHC 32.5 30.0 - 36.0 g/dL   RDW 14.3 11.5 - 15.5 %   Platelets 168 150 - 400 K/uL  Basic metabolic panel Status: Abnormal   Collection Time: 11/03/14 7:11 AM  Result Value Ref Range   Sodium 137 135 - 145 mmol/L   Potassium 3.4 (L) 3.5 - 5.1 mmol/L   Chloride 105 96 - 112 mmol/L   CO2 24 19 - 32 mmol/L   Glucose, Bld 87 70 - 99 mg/dL   BUN 11 6 - 23 mg/dL   Creatinine, Ser 1.19 0.50 - 1.35 mg/dL   Calcium 8.6 8.4 - 10.5 mg/dL   GFR calc non Af Amer 71 (L) >90 mL/min   GFR calc Af Amer 83 (L) >90 mL/min    Comment: (NOTE) The eGFR has been calculated using the CKD EPI equation. This calculation has not been validated in all clinical situations. eGFR's persistently <90  mL/min signify possible Chronic Kidney Disease.    Anion gap 8 5 - 15  CBC Status: None   Collection Time: 11/04/14 4:37 AM  Result Value Ref Range   WBC 6.1 4.0 - 10.5 K/uL   RBC 4.76 4.22 - 5.81 MIL/uL   Hemoglobin 14.1 13.0 - 17.0 g/dL   HCT 43.6 39.0 - 52.0 %   MCV 91.6 78.0 - 100.0 fL   MCH 29.6 26.0 - 34.0 pg   MCHC 32.3 30.0 - 36.0 g/dL   RDW 14.3 11.5 - 15.5 %   Platelets 188 150 - 400 K/uL  Basic metabolic panel Status: Abnormal   Collection Time: 11/04/14 4:37 AM  Result Value Ref Range   Sodium 136 135 - 145 mmol/L   Potassium 3.7 3.5 - 5.1 mmol/L   Chloride 105 96 - 112 mmol/L   CO2 23 19 - 32 mmol/L   Glucose, Bld 89 70 - 99 mg/dL   BUN 10 6 - 23 mg/dL   Creatinine, Ser 1.23 0.50 - 1.35 mg/dL   Calcium 8.6 8.4 - 10.5 mg/dL   GFR calc non Af Amer 68 (L) >90 mL/min   GFR calc Af Amer 79 (L) >90 mL/min    Comment: (NOTE) The eGFR has been calculated using the CKD EPI equation. This calculation has not been validated in all clinical situations. eGFR's persistently <90 mL/min signify possible Chronic Kidney Disease.    Anion gap 8 5 - 15      Imaging Results (  Last 48 hours)    No results found.       Medical Problem List and Plan: 1. Functional deficits secondary to right ACA infarct embolic secondary to unknown source 2. DVT Prophylaxis/Anticoagulation: Subcutaneous Lovenox. Monitor platelet counts of any signs of bleeding. Venous Doppler studies negative 3. Pain Management: Tylenol as needed 4. Dysphagia/left side inattention. Mechanical soft diet. Follow-up speech therapy 5. Neuropsych: This patient is capable of making decisions on his own behalf. 6. Skin/Wound Care: Routine skin checks 7. Fluids/Electrolytes/Nutrition: Strict I&O's with follow-up chemistries 8. Hypertension with poor medical compliance. Norvasc 10 mg daily, Coreg 6.25 mg  twice a day, lisinopril 40 mg daily. Monitor with increased mobility 9. Urine drug screen positive for marijuana. Counseling 10. Hyperlipidemia. Lipitor   Post Admission Physician Evaluation: 1. Functional deficits secondary to Right ACA embolic infarct with left hemiparesis and cognitive deficits. 2. Patient is admitted to receive collaborative, interdisciplinary care between the physiatrist, rehab nursing staff, and therapy team. 3. Patient's level of medical complexity and substantial therapy needs in context of that medical necessity cannot be provided at a lesser intensity of care such as a SNF. 4. Patient has experienced substantial functional loss from his/her baseline which was documented above under the "Functional History" and "Functional Status" headings. Judging by the patient's diagnosis, physical exam, and functional history, the patient has potential for functional progress which will result in measurable gains while on inpatient rehab. These gains will be of substantial and practical use upon discharge in facilitating mobility and self-care at the household level. 5. Physiatrist will provide 24 hour management of medical needs as well as oversight of the therapy plan/treatment and provide guidance as appropriate regarding the interaction of the two. 6. 24 hour rehab nursing will assist with bladder management, bowel management, safety, skin/wound care, disease management, medication administration, pain management and patient education and help integrate therapy concepts, techniques,education, etc. 7. PT will assess and treat for/with: pre gait, gait training, endurance , safety, equipment, neuromuscular re education. Goals are: Min A mob. 8. OT will assess and treat for/with: ADLs, Cognitive perceptual skills, Neuromuscular re education, safety, endurance, equipment. Goals are: Min A. Therapy may proceed with showering this patient. 9. SLP will assess and treat for/with:  Memory, attention, problem solving, thought organization, sequencing, initiation. Goals are: Supervision with medications, modified independent with orientation. 10. Case Management and Social Worker will assess and treat for psychological issues and discharge planning. 11. Team conference will be held weekly to assess progress toward goals and to determine barriers to discharge. 12. Patient will receive at least 3 hours of therapy per day at least 5 days per week. 13. ELOS: 20-25 days  14. Prognosis: good     Charlett Blake M.D. [facility] FAAPM&R (Sports Med, Neuromuscular Med) Diplomate Am Board of Electrodiagnostic Med  11/04/2014

## 2014-11-05 NOTE — Progress Notes (Signed)
PHARMACIST - PHYSICIAN COMMUNICATION DR:   Roda ShuttersXu CONCERNING: Protonix IV to Oral Route Change Policy  RECOMMENDATION: This patient is receiving Protonix by the intravenous route.  Based on criteria approved by the Pharmacy and Therapeutics Committee, this drug is being converted to the equivalent oral dose form(s).  DESCRIPTION: These criteria include:  The patient is eating (either orally or via tube) and/or has been taking other orally administered medications for a least 24 hours  There is no active GI bleed or impaired GI absorption noted.   If you have questions about this conversion, please contact the Pharmacy Department  []   [phone]( [phone] )  [facility] [x]   [phone]( [phone] )  [facility]  []   [phone]( [phone] )  [facility] []   [phone]( [phone] )  [facility]    Georgina PillionElizabeth Pawan Knechtel, PharmD, BCPS Clinical Pharmacist Pager: [phone] 11/05/2014 9:15 AM

## 2014-11-05 NOTE — Progress Notes (Signed)
Physical Therapy Treatment Patient Details Name: [name] Short Sheppard MRN: [id number] DOB: [birth year] Today's Date: 11/05/2014    History of Present Illness Pt presents with L sided weakness, s/p R frontal/parietal CVA.  PMH: Uncontrolled HTN, BP parameters <180/105    PT Comments    Patient progressing slowly with mobility. Sitting balance improved today with moments of Min guard assist when not distracted with appropriate cues for trunk. Pt able to attend/reach to left side with manual cues for trunk rotation/lateral flexion. Continues to demonstrate delayed processing. Able to stand fully upright with mod manual cues for lumbar/thoracic extension and hip extension for ~ 1 minute. Highly motivated. Very appropriate for CIR.   Follow Up Recommendations  CIR     Equipment Recommendations  None recommended by PT    Recommendations for Other Services       Precautions / Restrictions Precautions Precautions: Fall Restrictions Weight Bearing Restrictions: No    Mobility  Bed Mobility Overal bed mobility: Needs Assistance Bed Mobility: Supine to Sit;Sit to Sidelying;Rolling Rolling: Mod assist   Supine to sit: Max assist;+2 for physical assistance Sit to supine: Max assist Sit to sidelying: Mod assist;+2 for physical assistance General bed mobility comments: Requires assist to mobilize LLE to EOB and to elevate trunk. Able to lower trunk onto side and required assist to bring LEs into bed. Increased time and cues to perform transfer.  Transfers Overall transfer level: Needs assistance Equipment used: None Transfers: Sit to/from Stand Sit to Stand: Max assist;+2 physical assistance         General transfer comment: Requires cues for foot placement, hand placement with LUE on left knee and anterior translation. Able to extend hips and elicit upright posture with mod manual cues for thoracic/lumbar extension and hip extension.Therapist stabilizing LLE during stance. Able to obtain  full upright standing with proper cues.  Ambulation/Gait             General Gait Details: NA   Stairs            Wheelchair Mobility    Modified Rankin (Stroke Patients Only) Modified Rankin (Stroke Patients Only) Pre-Morbid Rankin Score: No symptoms Modified Rankin: Severe disability     Balance Overall balance assessment: Needs assistance Sitting-balance support: Feet supported;Single extremity supported Sitting balance-Leahy Scale: Poor Sitting balance - Comments: Sat EOB ranging from Max A (when distracted) to Min guard assist with facilitation of left rib cage, pt able to shift weight to left and perform lateral trunk flexion and trunk rotation. Able to maintain trunk control with Min-Mod facilitation/cues working on reaching to items on tray. Postural control: Right lateral lean Standing balance support: During functional activity Standing balance-Leahy Scale: Zero Standing balance comment: Able to maintain standing ~1 minute varying from Max-Min A assist of 2 for safety. Therapist stabilizing LLE at times to prevent knee buckling. Manual cues to facilitate thoracic/lumbar ext and hip extension to obtain standing upright.                     Cognition Arousal/Alertness: Awake/alert Behavior During Therapy: Flat affect Overall Cognitive Status: Impaired/Different from baseline Area of Impairment: Memory;Attention;Safety/judgement;Following commands;Problem solving   Current Attention Level: Sustained Memory: Decreased recall of precautions;Decreased short-term memory Following Commands: Follows one step commands consistently;Follows one step commands with increased time Safety/Judgement: Decreased awareness of deficits;Decreased awareness of safety Awareness: Intellectual Problem Solving: Slow processing;Decreased initiation;Difficulty sequencing;Requires verbal cues;Requires tactile cues General Comments: Pt continues to have delayed response time to  questions/commands. Easily distracted. Continues  to exhibit left neglect which improved sitting EOB and cues.    Exercises      General Comments        Pertinent Vitals/Pain Pain Assessment: Faces Faces Pain Scale: Hurts little more Pain Location: left ankle and penis secondary to catheter Pain Descriptors / Indicators: Grimacing Pain Intervention(s): Monitored during session    Home Living                      Prior Function            PT Goals (current goals can now be found in the care plan section) Progress towards PT goals: Progressing toward goals    Frequency  Min 4X/week    PT Plan Current plan remains appropriate    Co-evaluation             End of Session Equipment Utilized During Treatment: Gait belt Activity Tolerance: Patient tolerated treatment well Patient left: in bed;with call bell/phone within reach;with bed alarm set     Time: 1610-9604 PT Time Calculation (min) (ACUTE ONLY): 32 min  Charges:  $Therapeutic Activity: 8-22 mins $Neuromuscular Re-education: 8-22 mins                    G CodesAlvie Heidelberg A 11-18-2014, 1:19 PM  Alvie Heidelberg, PT, DPT [phone]

## 2014-11-05 NOTE — Progress Notes (Signed)
Rehab admissions - Bed available on inpatient rehab and will admit today.  Call me for questions.  #[phone]

## 2014-11-05 NOTE — Telephone Encounter (Signed)
Yes, we will have to.  He was an inpatient.  I didn't know you could be an inpatient without demographics.  Ok to wait until he sees [facility].

## 2014-11-05 NOTE — Progress Notes (Addendum)
[facility] Patient will be transferred to [facility]. Family notified of transfer. All belongings sent with patient.   Sim BoastHavy, RN

## 2014-11-05 NOTE — Progress Notes (Signed)
Occupational Therapy Treatment Patient Details Name: [name] Short MRN: [id number] DOB: 1966-01-26 Today's Date: 11/05/2014    History of present illness Pt presents with L sided weakness, s/p R frontal/parietal CVA.  PMH: Uncontrolled HTN, BP parameters <180/105   OT comments  Pt progressing with trunk control, attention, Lt inattention and Lt UE function.   He was able to sit EOB with min A for majority of session, but did require max A when he became distracted and thus lost his balance.  He was able to initiate movement with Lt UE and hand to assist with reaching for a cup.  He does demonstrate a 15-30 second delayed response.   Follow Up Recommendations  CIR    Equipment Recommendations  None recommended by OT    Recommendations for Other Services      Precautions / Restrictions Precautions Precautions: Fall       Mobility Bed Mobility Overal bed mobility: Needs Assistance Bed Mobility: Supine to Sit;Sit to Supine     Supine to sit: Max assist Sit to supine: Max assist   General bed mobility comments: Pt requires assist to move LEs to EOB, and mod A to lift trunk from bed if given instruction and increased time.  When moving back to supine, pt fell back on bed impulsively and was unable to assist with LEs.   Transfers                      Balance Overall balance assessment: Needs assistance Sitting-balance support: Feet supported Sitting balance-Leahy Scale: Poor Sitting balance - Comments: Pt sat EOB with min A, to brief periods of max A (usually when pt was distracted by something).  With facilitation at Lt rib cage, he was able to shift weight to Lt and perform trunk rotation to left.  Maintanined trunk control with min facilitation while working on reach with Lt. UE                            ADL Overall ADL's : Needs assistance/impaired                                       General ADL Comments: Worked EOB on sitting  balance, trunk control, attention to Lt and facilitation of Lt UE for reach.  Pt was able to initiate movement with Lt UE and hand to reach for cup and move it to his mouth.  He loses his attention in the process and thus loses control of his hand/UE, but when focused was able to inititate the movement.         Vision                 Additional Comments: Pt able to read clock on wall.  He has a right gaze preference, but will look to the Lt when cued, but requires increased time to initiate movement    Perception     Praxis      Cognition   Behavior During Therapy: Flat affect Overall Cognitive Status: Impaired/Different from baseline Area of Impairment: Attention;Following commands;Safety/judgement;Awareness;Problem solving   Current Attention Level: Sustained    Following Commands: Follows one step commands consistently;Follows one step commands with increased time Safety/Judgement: Decreased awareness of deficits;Decreased awareness of safety Awareness: Intellectual Problem Solving: Slow processing;Decreased initiation;Difficulty sequencing;Requires verbal cues;Requires tactile cues General Comments: Pt with  Lt neglect and decreased attention.  He has a 15-30 second dealyed response.  If he is provided with adequate time to respond to commands, he followed one step simple commands ~90% of the time.   Pt does self distract and is impulsive at times, but if you explain the safety reasons and need for him to stay focused, he was able to reduce the impulsivity     Extremity/Trunk Assessment               Exercises     Shoulder Instructions       General Comments      Pertinent Vitals/ Pain       Pain Assessment: Faces Faces Pain Scale: Hurts little more Pain Location: Lt ankle and penis due to catheter  Pain Descriptors / Indicators: Grimacing Pain Intervention(s): Monitored during session  Home Living                                           Prior Functioning/Environment              Frequency Min 2X/week     Progress Toward Goals  OT Goals(current goals can now be found in the care plan section)  Progress towards OT goals: Progressing toward goals     Plan Discharge plan remains appropriate    Co-evaluation                 End of Session     Activity Tolerance Patient tolerated treatment well   Patient Left in bed;with call bell/phone within reach;with bed alarm set   Nurse Communication Mobility status        Time: 1049-1140 OT Time Calculation (min): 51 min  Charges: OT General Charges $OT Visit: 1 Procedure OT Treatments $Neuromuscular Re-education: 38-52 mins  Mariana Goytia M 11/05/2014, 12:08 PM

## 2014-11-06 ENCOUNTER — Inpatient Hospital Stay (HOSPITAL_COMMUNITY): Payer: Self-pay | Admitting: Speech Pathology

## 2014-11-06 ENCOUNTER — Inpatient Hospital Stay (HOSPITAL_COMMUNITY): Payer: Medicaid Other | Admitting: Occupational Therapy

## 2014-11-06 ENCOUNTER — Inpatient Hospital Stay (HOSPITAL_COMMUNITY): Payer: Self-pay | Admitting: Physical Therapy

## 2014-11-06 LAB — CBC WITH DIFFERENTIAL/PLATELET
BASOS PCT: 0 % (ref 0–1)
Basophils Absolute: 0 10*3/uL (ref 0.0–0.1)
EOS ABS: 0.6 10*3/uL (ref 0.0–0.7)
Eosinophils Relative: 9 % — ABNORMAL HIGH (ref 0–5)
HCT: 42.7 % (ref 39.0–52.0)
Hemoglobin: 13.6 g/dL (ref 13.0–17.0)
Lymphocytes Relative: 16 % (ref 12–46)
Lymphs Abs: 1.1 10*3/uL (ref 0.7–4.0)
MCH: 29.5 pg (ref 26.0–34.0)
MCHC: 31.9 g/dL (ref 30.0–36.0)
MCV: 92.6 fL (ref 78.0–100.0)
Monocytes Absolute: 0.9 10*3/uL (ref 0.1–1.0)
Monocytes Relative: 14 % — ABNORMAL HIGH (ref 3–12)
NEUTROS PCT: 61 % (ref 43–77)
Neutro Abs: 4.1 10*3/uL (ref 1.7–7.7)
PLATELETS: 169 10*3/uL (ref 150–400)
RBC: 4.61 MIL/uL (ref 4.22–5.81)
RDW: 14.3 % (ref 11.5–15.5)
WBC: 6.7 10*3/uL (ref 4.0–10.5)

## 2014-11-06 LAB — COMPREHENSIVE METABOLIC PANEL
ALK PHOS: 64 U/L (ref 39–117)
ALT: 19 U/L (ref 0–53)
ANION GAP: 10 (ref 5–15)
AST: 45 U/L — ABNORMAL HIGH (ref 0–37)
Albumin: 3 g/dL — ABNORMAL LOW (ref 3.5–5.2)
BUN: 15 mg/dL (ref 6–23)
CO2: 23 mmol/L (ref 19–32)
Calcium: 8.9 mg/dL (ref 8.4–10.5)
Chloride: 104 mmol/L (ref 96–112)
Creatinine, Ser: 1.12 mg/dL (ref 0.50–1.35)
GFR calc Af Amer: 89 mL/min — ABNORMAL LOW (ref >90)
GFR calc non Af Amer: 77 mL/min — ABNORMAL LOW (ref >90)
Glucose, Bld: 83 mg/dL (ref 70–99)
POTASSIUM: 4.2 mmol/L (ref 3.5–5.1)
SODIUM: 137 mmol/L (ref 135–145)
TOTAL PROTEIN: 8.6 g/dL — AB (ref 6.0–8.3)
Total Bilirubin: 1.1 mg/dL (ref 0.3–1.2)

## 2014-11-06 NOTE — Evaluation (Signed)
Speech Language Pathology Assessment and Plan  Patient Details  Name: [name] Short MRN: [id number] Date of Birth: 1966-02-14  SLP Diagnosis: Cognitive Impairments;Speech and Language deficits;Dysphagia  Rehab Potential: Fair ELOS: 20-25 days    Today's Date: 11/06/2014 SLP Individual Time: 3419-3790 SLP Individual Time Calculation (min): 60 min   Problem List:  Patient Active Problem List   Diagnosis Date Noted  . Secondary cardiomyopathy 11/05/2014  . Malignant hypertension 11/05/2014  . Hyperlipidemia LDL goal <70 11/05/2014  . Cigarette smoker 11/05/2014  . Marijuana abuse 11/05/2014  . Hypokalemia 11/05/2014  . Left hemiparesis 11/05/2014  . Cognitive deficit, post-stroke 11/05/2014  . Left-sided weakness   . Acute right anterior cerebral artery (ACA) ischemic stroke 10/31/2014   Past Medical History:  Past Medical History  Diagnosis Date  . Hypertension     a. Previously on meds but none in several years.  . Smoker     a. Previous tobacco - quit ~ 2013. Ongoing daily marijuana usage. Occasional cigar.  . Stroke     a. 10/2014 MRI Head: acute mod size nonhemorrhagic R ant cerebral artery distribution infarct involving the medial aspect of the right frontal and parietal lobe and right aspect of the corpus callosum w/ local mass effect-->TPA;  b. 10/2014 MRA: Abrupt cut off of the R ACA A2 segment;  b. 10/2014 Carotid U/S: 1-39% bilat ICA stenosis.  . Cardiomyopathy     a. 10/2014 Echo: EF 35-40%, diff HK, sev LVH, mod dil LA, PASP 33mHg.   Past Surgical History:  Past Surgical History  Procedure Laterality Date  . Tee without cardioversion N/A 11/03/2014    Procedure: TRANSESOPHAGEAL ECHOCARDIOGRAM (TEE);  Surgeon: DLarey Dresser MD;  [facility]  Service: Cardiovascular;  Laterality: N/A;    Assessment / Plan / Recommendation Clinical Impression CCloyce Blankenhornis a 49year old right handed  male with history of hypertension as well as remote tobacco  abuse. Patient on no scheduled medications upon admission. Patient independent prior to admission living with his girlfriend working at [employer] Presented 10/31/2014 with left-sided weakness and altered mental status. Noted blood pressure 220/110. Urine drug screen positive for marijuana. MRI of the brain showed acute ischemic right ACA territory infarct. 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 Irregularity of the A2 segment of the anterior cerebral bilaterally with abrupt cut off of flow at the A2 segment of the right ACA 1.5 cm above its origin consistent with patient's acute right ACA distribution infarct. Carotid Dopplers were no ICA stenosis. Echocardiogram with ejection fraction 35-40% with diffuse hypokinesis. Venous Doppler studies lower extremities negative for DVT. Neurology consulted patient did  receive TPA. Presently maintained on aspirin/Plavix for CVA prophylaxis as well as subcutaneous Lovenox for DVT prophylaxis. TEE showed ejection fraction of 35% without embolus or thrombus. Blood pressure has been monitored closely as initially patient did receive Cardene drip. Speech therapy follow-up noted poor attention to the left as well as limited awareness of deficits. He is tolerating a mechanical soft diet.  [facility] and demonstrates severe cognitive impairments impacting memory, safety awareness, sustained attention, executive function with basic tasks as well as displaying comprehension deficits which impacts the patient's overall safety with functional tasks.  Patient would benefit from skilled SLP intervention to maximize cognition impairments, comprehension, and dysphagia in order to maximize her functional independence prior  to discharge.  Anticipate patient will require SNF facility with follow up SLP services.     Skilled Therapeutic Interventions          Skilled cognitive and beside swallow evaluations completed; see above. Reviewed results and recommendations with patient.  SLP Assessment  Patient will need skilled Speech Lanaguage Pathology Services during CIR admission    Recommendations  Diet Recommendations: Dysphagia 3 (Mechanical Soft);Thin liquid Liquid Administration via: Cup;No straw Medication Administration: Whole meds with puree Supervision: Full supervision/cueing for compensatory strategies;Patient able to self feed Compensations: Slow rate;Small sips/bites;Check for pocketing;Follow solids with liquid Postural Changes and/or Swallow Maneuvers: Seated upright 90 degrees;Upright 30-60 min after meal Oral Care Recommendations: Oral care BID Patient destination: [facility] (SNF) Follow up Recommendations: Skilled Nursing facility Equipment Recommended: None recommended by SLP    SLP Frequency 3 to 5 out of 7 days   SLP Treatment/Interventions Cognitive remediation/compensation;Cueing hierarchy;Dysphagia/aspiration precaution training;Internal/external aids;Functional tasks;Environmental controls;Multimodal communication approach;Therapeutic Exercise;Patient/family education;Therapeutic Activities;Speech/Language facilitation    Pain Pain Assessment Pain Assessment: No/denies pain Prior Functioning Cognitive/Linguistic Baseline: Within functional limits Type of Home: Apartment  Lives With: Significant other Available Help at Discharge: Family Education: working at [employer] x approx 11 years  Vocation: Full time employment  Short Term Goals: Week 1: SLP Short Term Goal 1 (Week 1): Pt will tolerate dys 3 diet with thin liquids using comp strategies with min A. SLP Short Term Goal 2 (Week 1): Pt will be oriented to person, place, time, situation with external supports with mod A. SLP Short Term Goal 3 (Week 1): Pt will follow basic 2 step commands in fuctional  tasks with mod A. SLP Short Term Goal 4 (Week 1): Pt will sustain attention to a functional task for 5-10 minutes with mod A. SLP Short Term Goal 5 (Week 1): Pt will attend to the L side in basic functional task with mod A. SLP Short Term Goal 6 (Week 1): Pt will identify 2 cognitive deficits and 2 physical deficits with mod A.  See FIM for current functional status Refer to Care Plan for Long Term Goals  Recommendations for other services: None  Discharge Criteria: Patient will be discharged from SLP if patient refuses treatment 3 consecutive times without medical reason, if treatment goals not met, if there is a change in medical status, if patient makes no progress towards goals or if patient is discharged from hospital.  The above assessment, treatment plan, treatment alternatives and goals were discussed and mutually agreed upon: by patient and by family   Annabell Howells, M.A. CCC-SLP  Annabell Howells A 11/06/2014, 5:33 PM

## 2014-11-06 NOTE — Progress Notes (Signed)
Social Work Assessment and Plan Social Work Assessment and Plan  Patient Details  Name: [name] Short MRN: [id number] Date of Birth: January 26, 1966  Today's Date: 11/06/2014  Problem List:  Patient Active Problem List   Diagnosis Date Noted  . Secondary cardiomyopathy 11/05/2014  . Malignant hypertension 11/05/2014  . Hyperlipidemia LDL goal <70 11/05/2014  . Cigarette smoker 11/05/2014  . Marijuana abuse 11/05/2014  . Hypokalemia 11/05/2014  . Left hemiparesis 11/05/2014  . Cognitive deficit, post-stroke 11/05/2014  . Left-sided weakness   . Acute right anterior cerebral artery (ACA) ischemic stroke 10/31/2014   Past Medical History:  Past Medical History  Diagnosis Date  . Hypertension     a. Previously on meds but none in several years.  . Smoker     a. Previous tobacco - quit ~ 2013. Ongoing daily marijuana usage. Occasional cigar.  . Stroke     a. 10/2014 MRI Head: acute mod size nonhemorrhagic R ant cerebral artery distribution infarct involving the medial aspect of the right frontal and parietal lobe and right aspect of the corpus callosum w/ local mass effect-->TPA;  b. 10/2014 MRA: Abrupt cut off of the R ACA A2 segment;  b. 10/2014 Carotid U/S: 1-39% bilat ICA stenosis.  . Cardiomyopathy     a. 10/2014 Echo: EF 35-40%, diff HK, sev LVH, mod dil LA, PASP 59mHg.   Past Surgical History:  Past Surgical History  Procedure Laterality Date  . Tee without cardioversion N/A 11/03/2014    Procedure: TRANSESOPHAGEAL ECHOCARDIOGRAM (TEE);  Surgeon: DLarey Dresser MD;  [facility] [facility]  Service: Cardiovascular;  Laterality: N/A;   Social History:  reports that he has been smoking Cigars.  He does not have any smokeless tobacco history on file. He reports that he drinks alcohol. He reports that he uses illicit drugs.  Family / Support Systems Marital Status: Single Patient Roles: Partner Spouse/Significant Other: [name]-cell Children: TTiffany-47yo daughter   [phone] Supports: 47 4yo daughter Anticipated Caregiver: wife and daughter Ability/Limitations of Caregiver: Wife is a CNA at [employer] and is flexible with her schedule, while daughter works nights M-F Caregiver Availability: Other (Comment) (Wife and daughter to work on plan-aware 24 hr) Family Dynamics: Close knit with wife and daughter's, all plan to pull together to assist pt with his care and recovery from this stroke. Pt states: " Got to get better." [name] encouraged by the progress she has seen already.  Social History Preferred language:  Religion:  Cultural Background: No issues Education: High School Read: Yes Write: Yes Employment Status: Employed Name of Employer: [employer] to Work Plans: Unsure at this time LFreight forwarderIssues: No issues Guardian/Conservator: None-according to MD pt is capable of making his own decisions while here.  Will make sure a fmaily member is here also due to pt's slow processing from stroke   Abuse/Neglect Physical Abuse: Denies Verbal Abuse: Denies Sexual Abuse: Denies Exploitation of patient/patient's resources: Denies Self-Neglect: Denies  Emotional Status Pt's affect, behavior adn adjustment status: Pt is motivated to improve, he states: " I'm not going to a nursing home."  He has alwasy been independent and taken care of others besides himself.  He is wanting to recover from this.  Nothing has happened like this to him before, always been self sufficeint. Recent Psychosocial Issues: concerns about finances, applying for necessary services, recovering from this stroke Pyschiatric History: No history deferred depression screen at this time due to falling alseep while interviewing.  Would benefit from Neuro-psych seeing  while here due to young age for coping. Substance Abuse History: Smoked cigars every once in awhile-marjuana plans to quit and Zena Amos will make sure of it.  Patient / Family  Perceptions, Expectations & Goals Pt/Family understanding of illness & functional limitations: Wife can explain his stroke and deficits, she has talked with the MD's and has a good understanding of his treatment plan. Pt appears to realize he has had a stroke and his left side is affected.  Difficult to arouse keeps falling asleep exhausted from therapies. Premorbid pt/family roles/activities: Boyfriend, father, Employee, Jon Gills, friend, etc Anticipated changes in roles/activities/participation: resume Pt/family expectations/goals: Rochele Raring states: " I want to recover as much as he can before coming home."  Pt states: " I going to get better, not going to no nursing home."  [employer]: None Premorbid Home Care/DME Agencies: None Transportation available at discharge: Family Resource referrals recommended: Neuropsychology, Support group (specify)  Discharge Planning Living Arrangements: Spouse/significant other, Children Support Systems: Spouse/significant other, Children, Friends/neighbors Type of Residence: Private residence [employer] Resources: Government social research officer (Will apply for [employer]) Financial Resources: Employment, Secondary school teacher Screen Referred: Yes Living Expenses: Rent Money Management: Patient, Significant Other Does the patient have any problems obtaining your medications?: Yes (Describe) (Has no insurance and was not seeing a MD prior to admission) Home Management: Education officer, environmental Patient/Family Preliminary Plans: Return home with Rochele Raring and Taft providing 24 hr care-working out their work schedules.  Both aware pt will require 24 hr physical care upon discharge.  Rochele Raring is a CNA at [employer] so is aware of the care being talked about.  Needs to apply for SSD and Medicaid while here. Social Work Anticipated Follow Up Needs: HH/OP, Support Group  Clinical Impression Unfortanate gentleman who has had a severe CVA.  His girlfriend and  daughter are involved and willing to assist with his care at discharge.  Will have them apply for SSD and Medicaid while here, pt does have a minor-47 yo In the home so should be eligible for MAF-Medicaid sooner than MAD-Medicaid. Will work on a safe discharge plan and provide support while here.  Elease Hashimoto 11/06/2014, 3:00 PM

## 2014-11-06 NOTE — Care Management Note (Signed)
[facility] Individual Statement of Services  Patient Name:  [name] Short  Date:  11/06/2014  Welcome to the [facility].  Our goal is to provide you with an individualized program based on your diagnosis and situation, designed to meet your specific needs.  With this comprehensive rehabilitation program, you will be expected to participate in at least 3 hours of rehabilitation therapies Monday-Friday, with modified therapy programming on the weekends.  Your rehabilitation program will include the following services:  Physical Therapy (PT), Occupational Therapy (OT), Speech Therapy (ST), 24 hour per day rehabilitation nursing, Therapeutic Recreaction (TR), Neuropsychology, Case Management (Social Worker), Rehabilitation Medicine, Nutrition Services and Pharmacy Services  Weekly team conferences will be held on Wednesday to discuss your progress.  Your Social Worker will talk with you frequently to get your input and to update you on team discussions.  Team conferences with you and your family in attendance may also be held.  Expected length of stay: 21-28 days Overall anticipated outcome: min/mod level  Depending on your progress and recovery, your program may change. Your Social Worker will coordinate services and will keep you informed of any changes. Your Social Worker's name and contact numbers are listed  below.  The following services may also be recommended but are not provided by the [facility]:   Driving Evaluations  Home Health Rehabiltiation Services  Outpatient Rehabilitation Services  Vocational Rehabilitation   Arrangements will be made to provide these services after discharge if needed.  Arrangements include referral to agencies that provide these services.  Your insurance has been verified to be:  Self Pay Your primary doctor is:  None  Pertinent information will be shared with your doctor and your insurance  company.  Social Worker:  Dossie DerBecky Kienna Moncada, SW [phone] or [phone]) [phone]  Information discussed with and copy given to patient by: Lucy Chrisupree, Ellan Tess G, 11/06/2014, 11:03 AM

## 2014-11-06 NOTE — Progress Notes (Signed)
Trish MageEugenia M Yamilex Borgwardt, RN Rehab Admission Coordinator Signed Physical Medicine and Rehabilitation PMR Pre-admission 11/04/2014 11:49 AM  Related encounter: ED to Hosp-Admission (Discharged) [facility]    Expand All Collapse All   PMR Admission Coordinator Pre-Admission Assessment  Patient: [name] Short is an 49 y.o., male MRN: [id number] DOB: 07/13/1966 Height: 5\' 10"  (177.8 cm) Weight: 93 kg (205 lb 0.4 oz)  Insurance Information Self pay - no insurance  Medicaid Application Date: Case Manager:  Disability Application Date: Case Worker:   Emergency Conservator, museum/galleryContact Information Contact Information    Name Relation Home Work Mobile   Self,[name] Spouse   [phone]   Short,Tiffany Daughter   [phone]     Current Medical History  Patient Admitting Diagnosis: R ACA infarct  History of Present Illness: A 49 y.o.right handed male with history of hypertension as well as remote tobacco abuse. Patient on no scheduled medications upon admission. Patient independent prior to admission living with his girlfriend working at [employer]. Presented 10/31/2014 with left-sided weakness and altered mental status. Noted blood pressure 220/110. Urine drug screen positive for marijuana. MRI of the brain showed acute ischemic right ACA territory infarct. 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 Irregularity of the A2 segment of the anterior cerebral bilaterally with abrupt cut off of flow at the A2 segment of the right ACA 1.5 cm above its origin consistent with patient's acute right ACA distribution infarct. Carotid Dopplers were no ICA stenosis. Echocardiogram  with ejection fraction 35-40% with diffuse hypokinesis. Venous Doppler studies lower extremities negative for DVT. Neurology consulted patient did receive TPA. Presently maintained on aspirin/Plavix for CVA prophylaxis as well as subcutaneous Lovenox for DVT prophylaxis. TEE showed ejection fraction of 35% without embolus or thrombus. No plan at this time for loop recorder and considering outpatient 30 day monitoring. Blood pressure has been monitored closely as initially patient did receive Cardene drip. A renal artery duplex was completed showing no evidence of renal artery stenosis bilaterally. Speech therapy follow-up noted poor attention to the left as well as limited awareness of deficits. He is tolerating a mechanical soft diet. Physical and Occupational therapy evaluations completed with recommendations of physical medicine rehabilitation consult.  Total: 8=NIH  Past Medical History  Past Medical History  Diagnosis Date  . Hypertension     a. Previously on meds but none in several years.  . Smoker     a. Previous tobacco - quit ~ 2013. Ongoing daily marijuana usage. Occasional cigar.  . Stroke     a. 10/2014 MRI Head: acute mod size nonhemorrhagic R ant cerebral artery distribution infarct involving the medial aspect of the right frontal and parietal lobe and right aspect of the corpus callosum w/ local mass effect-->TPA; b. 10/2014 MRA: Abrupt cut off of the R ACA A2 segment; b. 10/2014 Carotid U/S: 1-39% bilat ICA stenosis.  . Cardiomyopathy     a. 10/2014 Echo: EF 35-40%, diff HK, sev LVH, mod dil LA, PASP 45mmHg.    Family History  family history includes Other in an other family member.  Prior Rehab/Hospitalizations: No previous rehab admissions.  Current Medications   Current facility-administered medications:  . 0.9 % sodium chloride infusion, , Intravenous, Continuous, Ok Anishristopher R Berge, NP, Last Rate: 20 mL/hr at 11/03/14 1402, 500 mL at  11/03/14 1402 . acetaminophen (TYLENOL) tablet 650 mg, 650  mg, Oral, Q4H PRN, 650 mg at 11/03/14 0302 **OR** acetaminophen (TYLENOL) suppository 650 mg, 650 mg, Rectal, Q4H PRN, Ramond Marrow, DO . amLODipine (NORVASC) tablet 10 mg, 10 mg, Oral, Daily, Marvel Plan, MD, 10 mg at 11/05/14 0847 . aspirin EC tablet 81 mg, 81 mg, Oral, Daily, Layne Benton, NP, 81 mg at 11/05/14 0846 . atorvastatin (LIPITOR) tablet 40 mg, 40 mg, Oral, q1800, Layne Benton, NP, 40 mg at 11/04/14 1816 . carvedilol (COREG) tablet 12.5 mg, 12.5 mg, Oral, BID WC, Marvel Plan, MD, 12.5 mg at 11/05/14 0847 . clopidogrel (PLAVIX) tablet 75 mg, 75 mg, Oral, Daily, Layne Benton, NP, 75 mg at 11/05/14 0846 . enoxaparin (LOVENOX) injection 40 mg, 40 mg, Subcutaneous, Q24H, York Spaniel, MD, 40 mg at 11/04/14 2142 . labetalol (NORMODYNE,TRANDATE) injection 20 mg, 20 mg, Intravenous, Q10 min PRN, Dione Booze, MD, 20 mg at 11/05/14 0234 . lisinopril (PRINIVIL,ZESTRIL) tablet 40 mg, 40 mg, Oral, Daily, Layne Benton, NP, 40 mg at 11/05/14 0846 . pantoprazole (PROTONIX) EC tablet 40 mg, 40 mg, Oral, QHS, Ann Held, [facility]  Patients Current Diet: DIET DYS 3 Room service appropriate?: Yes; Fluid consistency:: Thin  Precautions / Restrictions Precautions Precautions: Fall Restrictions Weight Bearing Restrictions: No   Prior Activity Level Community (5-7x/wk): Went out daily. 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 [employer]  Current Functional Level Cognition  Arousal/Alertness: Awake/alert Overall Cognitive Status: Impaired/Different from baseline Current Attention Level: Focused Orientation Level: Oriented to person Following Commands: Follows one step commands inconsistently, Follows one step commands with  increased time Safety/Judgement: Decreased awareness of deficits, Decreased awareness of safety General Comments: Still demonstrates L sided neglect, worked on acknowledgement of L side, pt able to read clock accurately, however not oriented to date "the clock has the wrong date" Attention: Sustained, Focused Focused Attention: Appears intact Sustained Attention: Impaired Sustained Attention Impairment: Verbal basic, Functional basic Awareness: Impaired Awareness Impairment: Intellectual impairment (decreased awareness to decreased left attention and delayed responses) Problem Solving: Impaired (pt needed assistance to locate call bell - ? fatigue factor) Problem Solving Impairment: Verbal basic, Functional basic Safety/Judgment: Impaired   Extremity Assessment (includes Sensation/Coordination)  Upper Extremity Assessment: LUE deficits/detail LUE Deficits / Details: flexion pattern with movement ( elbow flexion, wrist flexion, digit flexion) pt not moving L UE outside of flexion pattern this session LUE Sensation: decreased light touch, decreased proprioception LUE Coordination: decreased fine motor, decreased gross motor  Lower Extremity Assessment: Defer to PT evaluation LLE Deficits / Details: with static sitting pt with extensor tone ( knee extended and ankle plantar flexion)    ADLs  Overall ADL's : Needs assistance/impaired Eating/Feeding: Minimal assistance, Sitting Eating/Feeding Details (indicate cue type and reason): cues for swallowing precautions Grooming: Wash/dry face, Minimal assistance, Sitting Grooming Details (indicate cue type and reason): (A) for balance sitting supported eob with Max (A) Upper Body Bathing: Moderate assistance, Bed level Lower Body Bathing: Total assistance General ADL Comments: Pt required (A) to complete bed mobility and maintain eob sitting. pt completed weight shifting in sitting onto BIL UE . Pt able to tolerate well initially . pt  provided verbal cue that standing was going to occurr. Pt demonstrates immediate L UE flexion synergy and L LE extension. Transfer did not occurr this session due to DME not present  and extensor tone unsafe to attempt with +2 person (A)    Mobility  Overal bed mobility: +2 for physical assistance, Needs Assistance Bed Mobility: Supine to Sit, Sit to Supine Rolling: Mod assist, +2 for physical assistance, +2 for safety/equipment Supine to sit: +2 for physical assistance, Mod assist Sit to supine: +2 for physical assistance, Mod assist General bed mobility comments: Unable to use L LE functionally, one persn for torso management, one person for leg management. Pt unable to problem solve with bed mobility, did not initiate help with R UE    Transfers  Overall transfer level: Needs assistance Equipment used: None Transfers: Sit to/from Stand Sit to Stand: +2 physical assistance, Max assist General transfer comment: 2 person lift with gait belt and bed pad. Unable to achieve full standing today, pt did not respond to verbal or tactile cues for LE extension    Ambulation / Gait / Stairs / Wheelchair Mobility  Ambulation/Gait General Gait Details: Unable to assess due to BP    Posture / Balance Dynamic Sitting Balance Sitting balance - Comments: Pt needs max assist to maintain seated balance, with therapist sitting on L side to provide proprioceptive cueing  Balance Overall balance assessment: Needs assistance Sitting-balance support: Single extremity supported Sitting balance-Leahy Scale: Poor Sitting balance - Comments: Pt needs max assist to maintain seated balance, with therapist sitting on L side to provide proprioceptive cueing  Postural control: Right lateral lean    Special needs/care consideration BiPAP/CPAP No CPM No Continuous Drip IV 0.9% NS 50 ml/hr Dialysis No  Life Vest No Oxygen No Special Bed No Trach Size No Wound Vac (area) No  Skin  No  Bowel mgmt: Last BM 11/03/14 loose and incontinent of stool Bladder mgmt: Voiding WDL Diabetic mgmt No    Previous Home Environment Living Arrangements: Spouse/significant other Available Help at Discharge: Family Type of Home: Apartment Home Care Services: No Additional Comments: Unable to get detailed history  Discharge Living Setting Plans for Discharge Living Setting: Lives with (comment), Apartment (Lives with wife.) Type of Home at Discharge: Apartment Discharge Home Layout: One level Discharge Home Access: Level entry Does the patient have any problems obtaining your medications?: Yes (Describe)  Social/Family/Support Systems Patient Roles: Spouse, Parent (Has a wife and 3 children ages 80, 56 and 35.) Contact Information: [name] Self - wife [phone] Anticipated Caregiver: wife and 89 yo daughter Ability/Limitations of Caregiver: Wife works PT at [employer] and is a Lawyer. Dtr works nights. Caregiver Availability: Other (Comment) (Wife understands the likely need for 24/7 supervision/care.) Discharge Plan Discussed with Primary Caregiver: Yes Is Caregiver In Agreement with Plan?: Yes Does Caregiver/Family have Issues with Lodging/Transportation while Pt is in Rehab?: No  Goals/Additional Needs Patient/Family Goal for Rehab: PT min assist, OT/ST from supervision to mod assist goals Expected length of stay: 20-28 days Cultural Considerations: None Dietary Needs: Dys 3, thin liquids Equipment Needs: TBD Pt/Family Agrees to Admission and willing to participate: Yes (Spoke with wife about rehab admission.) Program Orientation Provided & Reviewed with Pt/Caregiver Including Roles & Responsibilities: Yes  Decrease burden of Care through IP rehab admission: N/A  Possible need for SNF placement upon discharge: Yes, if patient does not progress to where family can manager at home or if family cannot provide level of care needed at  discharge.  Patient Condition: This patient's medical and functional status has changed since the consult dated: 11/03/14 in which the Rehabilitation Physician determined and documented that the patient's condition is appropriate for intensive rehabilitative  care in an inpatient rehabilitation facility. See "History of Present Illness" (above) for medical update. Functional changes are: Currently requiring max assist +2 for transfers. Patient's medical and functional status update has been discussed with the Rehabilitation physician and patient remains appropriate for inpatient rehabilitation. Will admit to inpatient rehab today.  Preadmission Screen Completed By: Trish Mage, 11/05/2014 10:53 AM ______________________________________________________________________  Discussed status with Dr. Wynn Banker on 11/05/14 at 1056 and received telephone approval for admission today.  Admission Coordinator: Trish Mage, time1056/Date04/14/16          Cosigned by: Erick Colace, MD at 11/05/2014 11:03 AM  Revision History     Date/Time User Provider Type Action   11/05/2014 11:03 AM Erick Colace, MD Physician Cosign   11/05/2014 10:56 AM Trish Mage, RN Rehab Admission Coordinator Sign

## 2014-11-06 NOTE — Progress Notes (Signed)
Physical Therapy Assessment and Plan  Patient Details  Name: [name] Short MRN: [id number] Date of Birth: 09/14/1965  PT Diagnosis: Abnormal posture, Abnormality of gait, Cognitive deficits, Hemiplegia non-dominant, Impaired cognition and Impaired sensation Rehab Potential: Good ELOS: 21-28 days   Today's Date: 11/06/2014 PT Individual Time: 1030-1200 PT Individual Time Calculation (min): 90 min    Problem List:  Patient Active Problem List   Diagnosis Date Noted  . Secondary cardiomyopathy 11/05/2014  . Malignant hypertension 11/05/2014  . Hyperlipidemia LDL goal <70 11/05/2014  . Cigarette smoker 11/05/2014  . Marijuana abuse 11/05/2014  . Hypokalemia 11/05/2014  . Left hemiparesis 11/05/2014  . Cognitive deficit, post-stroke 11/05/2014  . Left-sided weakness   . Acute right anterior cerebral artery (ACA) ischemic stroke 10/31/2014    Past Medical History:  Past Medical History  Diagnosis Date  . Hypertension     a. Previously on meds but none in several years.  . Smoker     a. Previous tobacco - quit ~ 2013. Ongoing daily marijuana usage. Occasional cigar.  . Stroke     a. 10/2014 MRI Head: acute mod size nonhemorrhagic R ant cerebral artery distribution infarct involving the medial aspect of the right frontal and parietal lobe and right aspect of the corpus callosum w/ local mass effect-->TPA;  b. 10/2014 MRA: Abrupt cut off of the R ACA A2 segment;  b. 10/2014 Carotid U/S: 1-39% bilat ICA stenosis.  . Cardiomyopathy     a. 10/2014 Echo: EF 35-40%, diff HK, sev LVH, mod dil LA, PASP 53mHg.   Past Surgical History:  Past Surgical History  Procedure Laterality Date  . Tee without cardioversion N/A 11/03/2014    Procedure: TRANSESOPHAGEAL ECHOCARDIOGRAM (TEE);  Surgeon: DLarey Dresser MD;  [facility];  Service: Cardiovascular;  Laterality: N/A;    Assessment & Plan Clinical Impression: CChick Cousinsis a 49y.o.right handed male with history of hypertension  as well as remote tobacco abuse. Patient on no scheduled medications upon admission. Patient independent prior to admission living with his girlfriend working at [employer] Presented 10/31/2014 with left-sided weakness and altered mental status. Noted blood pressure 220/110. Urine drug screen positive for marijuana. MRI of the brain showed acute ischemic right ACA territory infarct. 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 Irregularity of the A2 segment of the anterior cerebral bilaterally with abrupt cut off of flow at the A2 segment of the right ACA 1.5 cm above its origin consistent with patient's acute right ACA distribution infarct. Carotid Dopplers were no ICA stenosis. Echocardiogram with ejection fraction 35-40% with diffuse hypokinesis. Venous Doppler studies lower extremities negative for DVT. Neurology consulted patient did receive TPA. Presently maintained on aspirin/Plavix for CVA prophylaxis as well as subcutaneous Lovenox for DVT prophylaxis. TEE showed ejection fraction of 35% without embolus or thrombus. No plan at this time for loop recorder and considering outpatient 30 day monitoring. Blood pressure has been monitored closely as initially patient did receive Cardene drip. A renal artery duplex was completed showing no evidence of renal artery stenosis bilaterally. Speech therapy follow-up noted poor attention to the left as well as limited awareness of deficits. He is tolerating a mechanical soft diet. Physical and Occupational therapy evaluations completed with recommendations of physical medicine rehabilitation consult  Patient currently requires total with mobility secondary to muscle paralysis, impaired timing and sequencing, decreased coordination and decreased motor  planning and decreased initiation, decreased attention, decreased awareness, decreased safety  awareness and delayed processing.  Prior to hospitalization, patient was independent  with mobility and lived with Significant other in a [place] home.  Home access is  Level entry.  Patient will benefit from skilled PT intervention to maximize safe functional mobility and minimize fall risk for planned discharge home with 24 hour supervision.  Anticipate patient will benefit from follow up Elim at discharge.  PT - End of Session Endurance Deficit: Yes Endurance Deficit Description: Difficult to discern if pt was limited by endurance or lethargy PT Assessment Rehab Potential (ACUTE/IP ONLY): Good Barriers to Discharge: Decreased caregiver support;Other (comment) (Need to clarify home environment and caregiver support with family) PT Patient demonstrates impairments in the following area(s): Balance;Endurance;Motor;Pain;Safety;Sensory;Skin Integrity PT Transfers Functional Problem(s): Bed Mobility;Bed to Chair;Car;Furniture PT Locomotion Functional Problem(s): Ambulation;Wheelchair Mobility;Stairs PT Plan PT Intensity: Minimum of 1-2 x/day ,45 to 90 minutes PT Frequency: 5 out of 7 days PT Duration Estimated Length of Stay: 21-28 days PT Treatment/Interventions: Training and development officer;Ambulation/gait training;Cognitive remediation/compensation;Community reintegration;Discharge planning;Disease management/prevention;DME/adaptive equipment instruction;Functional mobility training;Neuromuscular re-education;Patient/family education;Pain management;Splinting/orthotics;Stair training;Psychosocial support;Therapeutic Activities;Therapeutic Exercise;UE/LE Coordination activities;UE/LE Strength taining/ROM;Visual/perceptual remediation/compensation;Wheelchair propulsion/positioning;Skin care/wound management PT Transfers Anticipated Outcome(s): Min A  PT Locomotion Anticipated Outcome(s): Min A at a w/c level; Mod A ambulation PT Recommendation Recommendations for Other Services: Neuropsych  consult Follow Up Recommendations: Home health PT;24 hour supervision/assistance Patient destination: Home Equipment Recommended: To be determined  Skilled Therapeutic Intervention Pt received lying supine in bed; agreeable to therapy.  PT evaluation performed. See below for detailed findings. Treatment initiated with session focused on bed mobility (see details below for assist/cueing required).  Due to pt lethargy, it was difficult to complete transfers, w/c mobility, and ambulation.   It was also difficult to educated pt on findings, goals, and plan of care.  Pt will likely require reinforcement.  Hope to get clarification from family regarding home environment and caregiver support after D/C since there was not any family present during the treatment session.     PT Evaluation Precautions/Restrictions Precautions Precautions: Fall Precaution Comments: Rt gaze preference, decreased attention to Lt Restrictions Weight Bearing Restrictions: No General Chart Reviewed: Yes Vital SignsTherapy Vitals Pulse Rate: 62 BP: (!) 150/88 mmHg Patient Position (if appropriate): Lying Oxygen Therapy SpO2: 97 % O2 Device: Not Delivered Pain Pain Assessment Pain Assessment: 0-10 Pain Score: 0-No pain Pain Type: Chronic pain Pain Location: Ankle Pain Orientation: Left Pain Descriptors / Indicators: Aching Pain Frequency: Intermittent Pain Onset: On-going Patients Stated Pain Goal: 0 Pain Intervention(s): Medication (See eMAR) Multiple Pain Sites: Yes 2nd Pain Site Pain Score: 7 Pain Type: Acute pain Pain Location: Ankle Pain Orientation: Left Pain Descriptors / Indicators: Crushing Pain Onset: Sudden Pain Intervention(s): RN made aware Home Living/Prior Functioning Home Living Available Help at Discharge: Family (significant other works at night; pt states hs 11 year old daughter can help him at night) Type of Home: Apartment Home Access: Level entry Home Layout: One  level Additional Comments: unable to get detailed history due to pt requiring increased time for processing and initiation  Lives With: Significant other Prior Function Level of Independence: Independent with basic ADLs;Independent with homemaking with ambulation;Independent with gait;Independent with transfers  Able to Take Stairs?: Yes Driving: Yes Vocation: Full time employment 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  He has a right gaze preference, but will look to the Lt when cued.  He requires increased time to initiate movement.  Cognition Overall Cognitive Status: Impaired/Different from baseline Arousal/Alertness: Lethargic Orientation Level: Oriented X4 (Looked at daily calendar for today's date) Attention: Sustained Sustained Attention: Impaired Awareness: Impaired Awareness Impairment: Intellectual impairment Problem Solving: Impaired Safety/Judgment: Impaired Sensation Sensation Light Touch: Impaired by gross assessment Proprioception: Not tested Additional Comments: Formal testing of light touch and proprioception limited by cognition, but appeared impaired due to inability to recognize touch on L LE. Coordination Gross Motor Movements are Fluid and Coordinated: No Fine Motor Movements are Fluid and Coordinated: No Finger Nose Finger Test: unable to assess due to flaccid RUE Heel Shin Test: unable to asses due to flaccid L LE 9 Hole Peg Test: unable to assess due to flaccid RUE Motor  Motor Motor: Hemiplegia Motor - Skilled Clinical Observations: L hemiplegia; flaccid L LE   Mobility Bed Mobility Bed Mobility: Rolling Left;Supine to Sit;Sit to Supine;Scooting to [facility];Sitting - Scoot to Edge of Bed Rolling Left: 2: Max assist;With rail (Using L rail) Rolling Left  Details: Tactile cues for initiation;Tactile cues for sequencing;Tactile cues for weight shifting;Tactile cues for placement;Verbal cues for technique;Verbal cues for sequencing;Manual facilitation for weight shifting;Manual facilitation for placement Supine to Sit: 1: +2 Total assist;HOB flat Supine to Sit: Patient Percentage: 10% Supine to Sit Details: Tactile cues for initiation;Tactile cues for sequencing;Tactile cues for weight shifting;Verbal cues for technique;Verbal cues for sequencing;Manual facilitation for weight shifting;Manual facilitation for placement Sitting - Scoot to Edge of Bed: 1: +2 Total assist Sitting - Scoot to Edge of Bed: Patient Percentage: 10% Sitting - Scoot to Edge of Bed Details: Tactile cues for initiation;Tactile cues for sequencing;Tactile cues for placement;Tactile cues for weight shifting;Verbal cues for technique;Verbal cues for sequencing;Manual facilitation for placement;Manual facilitation for weight shifting Sit to Supine: 1: +2 Total assist Scooting to HOB: 1: +2 Total assist;With rail Scooting to [facility] Details: Tactile cues for initiation;Tactile cues for sequencing;Tactile cues for weight shifting;Tactile cues for placement;Verbal cues for technique;Verbal cues for sequencing;Manual facilitation for weight shifting;Manual facilitation for weight bearing Transfers Transfers: No (Unsafe due to pt lethargy ) Locomotion  Ambulation Ambulation: No (Unsafe due to pt lethargy) Gait Gait: No (Unsafe due to pt lethargy) Stairs / Additional Locomotion Stairs: No (Unsafe due to pt lethargy) Wheelchair Mobility Wheelchair Mobility: No (Unsafe due to pt lethargy)  Trunk/Postural Assessment  Cervical Assessment Cervical Assessment: Exceptions to [facility] Cervical Strength Overall Cervical Strength: Other (comment) (Due to L inattention) Overall Cervical Strength Comments: Pt demonstrated R lateral flexion/rotation and R gaze due to L inattention Thoracic  Assessment Thoracic Assessment: Within Functional Limits Lumbar Assessment Lumbar Assessment: Within Functional Limits Postural Control Postural Control: Deficits on evaluation Trunk Control: Impaired in seated; multidirectional LOB Righting Reactions: Pt did not demonstrate righting reactions when leaning laterally.  Postural Limitations: Bearing majority of weight on L side of pelvis; R side shortened, L elongated   Balance Balance Balance Assessed: Yes Static Sitting Balance Static Sitting - Balance Support: Bilateral upper extremity supported;Feet supported Static Sitting - Level of Assistance: 4: Min assist;1: +1 Total assist Static Sitting - Comment/# of Minutes: With no distractions, pt was min A; total +1 with distractions Dynamic Sitting Balance Dynamic Sitting - Balance Support: Right upper extremity supported;Feet supported Dynamic Sitting - Level of Assistance: 1: +1 Total assist Extremity Assessment  RUE Assessment RUE Assessment: Within Functional Limits LUE Assessment LUE Assessment: Exceptions to WFL (PROM WFL, flaccid  shoulder, trace bicep/tricep, finger flexion to loose gross grasp but no active extension) RLE Assessment RLE Assessment: Within Functional Limits LLE Assessment LLE Assessment: Exceptions to [facility] LLE Strength LLE Overall Strength: Deficits LLE Overall Strength Comments: Flaccid L LE  FIM:  FIM - Bed/Chair Transfer Bed/Chair Transfer Assistive Devices: Bed rails;HOB elevated (HOB elevated for supine>sit, HOB flat for sit>supine) Bed/Chair Transfer: 1: Supine > Sit: Total A (helper does all/Pt. < 25%);1: Sit > Supine: Total A (helper does all/Pt. < 25%);1: Two helpers (+2 for safety) FIM - Locomotion: Wheelchair Locomotion: Wheelchair: 0: Activity did not occur (Unsafe due to pt lethargy) FIM - Locomotion: Ambulation Locomotion: Ambulation: 0: Activity did not occur (Unsafe due to pt lethargy) FIM - Locomotion: Stairs Locomotion: Stairs: 0:  Activity did not occur (Unsafe due to pt lethargy)   Refer to Care Plan for Long Term Goals  Recommendations for other services: Neuropsych  Discharge Criteria: Patient will be discharged from PT if patient refuses treatment 3 consecutive times without medical reason, if treatment goals not met, if there is a change in medical status, if patient makes no progress towards goals or if patient is discharged from hospital.  The above assessment, treatment plan, treatment alternatives and goals were discussed and mutually agreed upon: Pt unable due to lethargy.   Kajuan Guyton 11/06/2014, 1:15 PM

## 2014-11-06 NOTE — Progress Notes (Signed)
Subjective/Complaints:  49 y.o.right handed  male with history of hypertension as well as remote tobacco abuse. Patient on no scheduled medications upon admission. Patient independent prior to admission living with his girlfriend working at [employer]. Presented 10/31/2014 with left-sided weakness and altered mental status. Noted blood pressure 220/110. Urine drug screen positive for marijuana. MRI of the brain showed acute ischemic right ACA territory infarct. 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 Irregularity of the A2 segment of the anterior cerebral bilaterally with abrupt cut off of flow at the A2 segment of the right ACA 1.5 cm above its origin consistent with patient's acute right ACA distribution infarct. Carotid Dopplers were no ICA stenosis. Echocardiogram with ejection fraction 35-40% with diffuse hypokinesis. Venous Doppler studies lower extremities negative for DVT. Neurology consulted patient did  receive TPA. Presently maintained on aspirin/Plavix   Patient had no issues overnight. Very long latency of response. States he is ready for therapy today OT in room Objective: Vital Signs: Blood pressure 152/95, pulse 74, temperature 98.5 F (36.9 C), temperature source Oral, resp. rate 18, weight 90.8 kg (200 lb 2.8 oz), SpO2 100 %. No results found. Results for orders placed or performed during the hospital encounter of 11/05/14 (from the past 72 hour(s))  CBC     Status: None   Collection Time: 11/05/14  5:40 PM  Result Value Ref Range   WBC 6.4 4.0 - 10.5 K/uL   RBC 4.71 4.22 - 5.81 MIL/uL   Hemoglobin 14.1 13.0 - 17.0 g/dL   HCT 42.8 39.0 - 52.0 %   MCV 90.9 78.0 - 100.0 fL   MCH 29.9 26.0 - 34.0 pg   MCHC 32.9 30.0 - 36.0 g/dL   RDW 14.2 11.5 - 15.5 %   Platelets 178 150 - 400 K/uL  Creatinine, serum     Status: Abnormal   Collection Time:  11/05/14  5:40 PM  Result Value Ref Range   Creatinine, Ser 1.18 0.50 - 1.35 mg/dL   GFR calc non Af Amer 72 (L) >90 mL/min   GFR calc Af Amer 83 (L) >90 mL/min    Comment: (NOTE) The eGFR has been calculated using the CKD EPI equation. This calculation has not been validated in all clinical situations. eGFR's persistently <90 mL/min signify possible Chronic Kidney Disease.   CBC WITH DIFFERENTIAL     Status: Abnormal   Collection Time: 11/06/14  6:11 AM  Result Value Ref Range   WBC 6.7 4.0 - 10.5 K/uL   RBC 4.61 4.22 - 5.81 MIL/uL   Hemoglobin 13.6 13.0 - 17.0 g/dL   HCT 42.7 39.0 - 52.0 %   MCV 92.6 78.0 - 100.0 fL   MCH 29.5 26.0 - 34.0 pg   MCHC 31.9 30.0 - 36.0 g/dL   RDW 14.3 11.5 - 15.5 %   Platelets 169 150 - 400 K/uL   Neutrophils Relative % 61 43 - 77 %   Neutro Abs 4.1 1.7 - 7.7 K/uL   Lymphocytes Relative 16 12 - 46 %   Lymphs Abs 1.1 0.7 - 4.0 K/uL   Monocytes Relative 14 (H) 3 - 12 %   Monocytes Absolute 0.9 0.1 - 1.0 K/uL   Eosinophils Relative 9 (H) 0 - 5 %   Eosinophils Absolute 0.6 0.0 - 0.7 K/uL   Basophils Relative 0 0 - 1 %   Basophils  Absolute 0.0 0.0 - 0.1 K/uL  Comprehensive metabolic panel     Status: Abnormal   Collection Time: 11/06/14  6:11 AM  Result Value Ref Range   Sodium 137 135 - 145 mmol/L   Potassium 4.2 3.5 - 5.1 mmol/L   Chloride 104 96 - 112 mmol/L   CO2 23 19 - 32 mmol/L   Glucose, Bld 83 70 - 99 mg/dL   BUN 15 6 - 23 mg/dL   Creatinine, Ser 1.12 0.50 - 1.35 mg/dL   Calcium 8.9 8.4 - 10.5 mg/dL   Total Protein 8.6 (H) 6.0 - 8.3 g/dL   Albumin 3.0 (L) 3.5 - 5.2 g/dL   AST 45 (H) 0 - 37 U/L   ALT 19 0 - 53 U/L   Alkaline Phosphatase 64 39 - 117 U/L   Total Bilirubin 1.1 0.3 - 1.2 mg/dL   GFR calc non Af Amer 77 (L) >90 mL/min   GFR calc Af Amer 89 (L) >90 mL/min    Comment: (NOTE) The eGFR has been calculated using the CKD EPI equation. This calculation has not been validated in all clinical situations. eGFR's persistently  <90 mL/min signify possible Chronic Kidney Disease.    Anion gap 10 5 - 15     HEENT: normal and extensive gold Cardio: RRR and no murmurs Resp: CTA B/L and unlabored GI: BS positive and nontender nondistended Extremity:  No Edema Skin:   Intact Neuro: Lethargic, Flat, Abnormal Sensory reduced sensation in the left lower extremity greater than left upper extremity to pinch, difficult to assess secondary to affect, Abnormal Motor 5/5 in the right deltoid, biceps, triceps, grip, hip flexor, knee extensor, ankle dorsiflexor and plantar flexor and Inattention Musc/Skel:  Other no pain with range of motion of the left upper or left lower limb. Gen. no acute distress   Assessment/Plan: 1. Functional deficits secondary to right ACA infarct with left hemiparesis as well as cognitive deficits which require 3+ hours per day of interdisciplinary therapy in a comprehensive inpatient rehab setting. Physiatrist is providing close team supervision and 24 hour management of active medical problems listed below. Physiatrist and rehab team continue to assess barriers to discharge/monitor patient progress toward functional and medical goals. FIM: FIM - Bathing Bathing Steps Patient Completed: Chest, Right upper leg Bathing: 1: Two helpers  FIM - Upper Body Dressing/Undressing Upper body dressing/undressing steps patient completed: Put head through opening of pull over shirt/dress Upper body dressing/undressing: 2: Max-Patient completed 25-49% of tasks FIM - Lower Body Dressing/Undressing Lower body dressing/undressing: 1: Two helpers        FIM - Control and instrumentation engineer Devices: Bed rails Bed/Chair Transfer: 1: Supine > Sit: Total A (helper does all/Pt. < 25%), 1: Sit > Supine: Total A (helper does all/Pt. < 25%)     Comprehension Comprehension Mode: Auditory Comprehension: 3-Understands basic 50 - 74% of the time/requires cueing 25 - 50%  of the  time  Expression Expression Mode: Verbal Expression: 3-Expresses basic 50 - 74% of the time/requires cueing 25 - 50% of the time. Needs to repeat parts of sentences.  Social Interaction Social Interaction: 2-Interacts appropriately 25 - 49% of time - Needs frequent redirection.  Problem Solving Problem Solving: 2-Solves basic 25 - 49% of the time - needs direction more than half the time to initiate, plan or complete simple activities  Memory Memory: 3-Recognizes or recalls 50 - 74% of the time/requires cueing 25 - 49% of the time  Medical Problem List and Plan:  1. Functional deficits secondary to right ACA infarct embolic secondary to unknown source 2.  DVT Prophylaxis/Anticoagulation: Subcutaneous Lovenox. Monitor platelet counts of any signs of bleeding. Venous Doppler studies negative 3. Pain Management: Tylenol as needed 4. Dysphagia/left side inattention. Mechanical soft diet. Follow-up speech therapy 5. Neuropsych: This patient is capable of making decisions on his own behalf. 6. Skin/Wound Care: Routine skin checks 7. Fluids/Electrolytes/Nutrition: Strict I&O's with follow-up chemistries 8. Hypertension with poor medical compliance. Norvasc 10 mg daily, Coreg 6.25 mg twice a day, lisinopril 40 mg daily. Monitor with increased mobility 9. Urine drug screen positive for marijuana. Counseling 10. Hyperlipidemia. Lipitor   LOS (Days) 1 A FACE TO FACE EVALUATION WAS PERFORMED  KIRSTEINS,ANDREW E 11/06/2014, 10:31 AM

## 2014-11-06 NOTE — Progress Notes (Signed)
Ranelle OysterZachary T Swartz, MD Physician Signed Physical Medicine and Rehabilitation Consult Note 11/03/2014 5:37 AM  Related encounter: ED to Hosp-Admission (Discharged) from 10/31/2014 [facility]    Expand All Collapse All        Physical Medicine and Rehabilitation Consult Reason for Consult: Right ACA infarct Referring Physician: Dr.Xu    HPI: [name] Short is a 49 y.o.right handed male with history of hypertension as well as remote tobacco abuse. Patient on no scheduled medications upon admission. Patient independent prior to admission living with his girlfriend working at [employer]. Presented 10/31/2014 with left-sided weakness and altered mental status. Urine drug screen positive for marijuana. MRI of the brain showed acute ischemic right ACA territory infarct. 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 Irregularity of the A2 segment of the anterior cerebral bilaterally with abrupt cut off of flow at the A2 segment of the right ACA 1.5 cm above its origin consistent with patient's acute right ACA distribution infarct. Carotid Dopplers were no ICA stenosis. Echocardiogram with ejection fraction 35-40% with diffuse hypokinesis. Venous Doppler studies lower extremities negative for DVT. Neurology consulted patient did not receive TPA. Presently maintained on aspirin/Plavix for CVA prophylaxis as well as subcutaneous Lovenox for DVT prophylaxis. TEE is pending as well as loop recorder placement. Speech therapy follow-up noted poor attention to the left as well as limited awareness of deficits. Occupational therapy evaluation completed with recommendations of physical medicine rehabilitation consult   Review of Systems  Gastrointestinal: Positive for constipation.  Neurological: Positive for weakness.  All other systems reviewed and are  negative.  Past Medical History  Diagnosis Date  . Hypertension     a. Previously on meds but none in several years.  . Smoker     a. Previous tobacco - quit ~ 2013. Ongoing daily marijuana usage. Occasional cigar.  . Stroke     a. 10/2014 MRI Head: acute mod size nonhemorrhagic R ant cerebral artery distribution infarct involving the medial aspect of the right frontal and parietal lobe and right aspect of the corpus callosum w/ local mass effect-->TPA; b. 10/2014 MRA: Abrupt cut off of the R ACA A2 segment; b. 10/2014 Carotid U/S: 1-39% bilat ICA stenosis.  . Cardiomyopathy     a. 10/2014 Echo: EF 35-40%, diff HK, sev LVH, mod dil LA, PASP 45mmHg.   History reviewed. No pertinent past surgical history. Family History  Problem Relation Age of Onset  . Other      no premature cad   Social History:  reports that he has been smoking Cigars. He does not have any smokeless tobacco history on file. He reports that he drinks alcohol. He reports that he uses illicit drugs. Allergies: No Known Allergies Medications Prior to Admission  Medication Sig Dispense Refill  . HYDROCODONE-ACETAMINOPHEN PO Take 1 tablet by mouth once.      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 [employer] Functional Status:  Mobility: Bed Mobility Overal bed mobility: +2 for physical assistance, Needs Assistance, + 2 for safety/equipment Bed Mobility: Supine to Sit, Sit to Supine Supine to sit: Max assist, +2 for physical assistance, +2  for safety/equipment Sit to supine: +2 for safety/equipment, +2 for physical assistance, Max assist General bed mobility  comments: cues for sequence and L UE / LE positioning Transfers Overall transfer level: Needs assistance Equipment used: None Transfers: Sit to/from Stand Sit to Stand: +2 physical assistance, Max assist General transfer comment: Two person assist with gait belt and bed pad; unable to achieve full standing position, but limited by increase in BP Ambulation/Gait General Gait Details: Unable to assess due to BP    ADL: ADL Overall ADL's : Needs assistance/impaired Eating/Feeding: Minimal assistance, Sitting Eating/Feeding Details (indicate cue type and reason): cues for swallowing precautions Grooming: Moderate assistance, Sitting Upper Body Bathing: Moderate assistance, Bed level Lower Body Bathing: Total assistance General ADL Comments: pt completed supine to sit eob with incr BP and returned to supine. pt sitting eob to work on trunk stability. WIfe present in room clapping at patient for arousal and calling name to the L. Spouse turning patients head with hands physically during session and asking if a neck pillow would prevent the R side neck rotation. Family advised to allow patient to rotate neck and a neck pillow is not advised at this time  Cognition: Cognition Overall Cognitive Status: Impaired/Different from baseline Arousal/Alertness: Awake/alert Orientation Level: Oriented to person, Oriented to place Attention: Sustained, Focused Focused Attention: Appears intact Sustained Attention: Impaired Sustained Attention Impairment: Verbal basic, Functional basic Awareness: Impaired Awareness Impairment: Intellectual impairment (decreased awareness to decreased left attention and delayed responses) Problem Solving: Impaired (pt needed assistance to locate call bell - ? fatigue factor) Problem Solving Impairment: Verbal basic, Functional basic Safety/Judgment: Impaired Cognition Arousal/Alertness: Lethargic Behavior During Therapy: Flat affect Overall Cognitive Status:  Impaired/Different from baseline Area of Impairment: Attention, Awareness, Safety/judgement, Following commands, Problem solving Current Attention Level: Sustained Memory: Decreased recall of precautions, Decreased short-term memory Following Commands: Follows one step commands with increased time, Follows one step commands inconsistently Safety/Judgement: Decreased awareness of deficits, Decreased awareness of safety Awareness: Intellectual Problem Solving: Difficulty sequencing, Slow processing, Decreased initiation, Requires verbal cues, Requires tactile cues General Comments: L side neglect  Blood pressure 168/102, pulse 78, temperature 99.4 F (37.4 C), temperature source Oral, resp. rate 16, height 5\' 10"  (1.778 m), weight 93 kg (205 lb 0.4 oz), SpO2 100 %. Physical Exam  Constitutional: He appears well-developed.  Eyes:  Pupils round and reactive to light  Neck: Normal range of motion. Neck supple. No thyromegaly present.  Cardiovascular: Normal rate and regular rhythm.  Respiratory: Effort normal and breath sounds normal. No respiratory distress.  GI: Soft. Bowel sounds are normal. He exhibits no distension. There is no tenderness.  Musculoskeletal: He exhibits no edema.  Neurological: He is alert.  Left-sided inattention. He was able to state his name but needed multiple cues for place. Needing cues to follow basic directions. Flexor synergy pattern LUE and extensor pattern in lower. Has difficulty initiating movements with left. Slight sense of pain. Fair sitting balance. Left central 7 and tongue deviation  Skin: Skin is warm and dry.  Psychiatric:  flat     Lab Results Last 24 Hours    Results for orders placed or performed during the hospital encounter of 10/31/14 (from the past 24 hour(s))  CBC Status: Abnormal   Collection Time: 11/02/14 7:01 AM  Result Value Ref Range   WBC 5.3 4.0 - 10.5 K/uL   RBC 4.26 4.22 - 5.81 MIL/uL   Hemoglobin 12.7  (L) 13.0 - 17.0 g/dL   HCT 40.9 81.1 - 91.4 %  MCV 93.4 78.0 - 100.0 fL   MCH 29.8 26.0 - 34.0 pg   MCHC 31.9 30.0 - 36.0 g/dL   RDW 16.1 09.6 - 04.5 %   Platelets 172 150 - 400 K/uL  Basic metabolic panel Status: Abnormal   Collection Time: 11/02/14 7:01 AM  Result Value Ref Range   Sodium 140 135 - 145 mmol/L   Potassium 3.3 (L) 3.5 - 5.1 mmol/L   Chloride 109 96 - 112 mmol/L   CO2 26 19 - 32 mmol/L   Glucose, Bld 94 70 - 99 mg/dL   BUN 12 6 - 23 mg/dL   Creatinine, Ser 4.09 0.50 - 1.35 mg/dL   Calcium 8.3 (L) 8.4 - 10.5 mg/dL   GFR calc non Af Amer 64 (L) >90 mL/min   GFR calc Af Amer 74 (L) >90 mL/min   Anion gap 5 5 - 15      Imaging Results (Last 48 hours)    Mr Brain Wo Contrast  11/01/2014 CLINICAL DATA: 49 year old hypertensive male with acute onset of left-sided weakness. Fell secondary to weakness. Subsequent encounter. EXAM: MRI HEAD WITHOUT CONTRAST MRA HEAD WITHOUT CONTRAST TECHNIQUE: Multiplanar, multiecho pulse sequences of the brain and surrounding structures were obtained without intravenous contrast. Angiographic images of the head were obtained using MRA technique without contrast. COMPARISON: 10/31/2014 brain MR and head CT. FINDINGS: MRI HEAD FINDINGS Acute moderate size nonhemorrhagic right anterior cerebral artery distribution infarct involving the medial aspect of the right frontal and parietal lobe and right aspect of the corpus callosum. Local mass effect. Tiny blood breakdown products cerebellum bilaterally may reflect result of prior hemorrhagic ischemia or prior trauma. Moderate white matter type changes periventricular region advanced for patient's age and most likely related to result of small vessel disease in this hypertensive obese patient. Other causes white matter type changes felt to be less likely considerations. Pontine altered signal intensity may represent  prominent perivascular spaces or result of prior small infarct. No intracranial mass lesion noted on this unenhanced exam. No hydrocephalus. Exophthalmos. Mild mucosal thickening paranasal sinuses most notable inferior right maxillary sinus with maximal thickness of 5 mm. Mild cervical spondylotic changes C3-4 mild spinal stenosis. Cervical medullary junction, pituitary region and pineal region unremarkable. MRA HEAD FINDINGS Aplastic A1 segment of the left anterior cerebral artery with A2 segment of the anterior cerebral artery supplied from the right bilaterally. Irregularity of the A2 segment of the anterior cerebral bilaterally with abrupt cut off of flow of the A2 segment of the right anterior cerebral artery 1.5 cm above its origin consistent with patient's acute right anterior cerebral artery distribution infarct. There is a small bulge at the left anterior cerebral artery A1-A2 junction which may represent origin of a small vessel although difficult to completely exclude a a tiny aneurysm. The mild motion degradation somewhat limits evaluation on source images. Mild irregularity and minimal narrowing cavernous segment right internal carotid artery. Fetal type origin of the left posterior cerebral artery. Middle cerebral artery mild to moderate branch vessel irregularity with narrowing. Left vertebral artery is poorly delineated and appears to end in a posterior inferior cerebellar artery distribution. Left posterior inferior cerebellar artery with moderate tandem stenoses. Ectatic right vertebral artery without high-grade stenosis. Moderate tandem stenosis right posterior inferior cerebellar artery. Ectatic basilar artery with areas of mild to slight moderate narrowing most notable mid aspect. Nonvisualized anterior inferior cerebellar arteries. Mild to moderate irregularity superior cerebellar artery bilaterally. Mild narrowing proximal right posterior cerebral artery. Fetal type origin  of  the left posterior cerebral artery. Moderate narrowing portions of P2/P3 and distal branches of the posterior cerebral artery bilaterally. IMPRESSION: This exam was interpreted during a PACS downtime with limited availability of comparison cases. MRI HEAD Acute moderate size nonhemorrhagic right anterior cerebral artery distribution infarct involving the medial aspect of the right frontal and parietal lobe and right aspect of the corpus callosum. Local mass effect. Tiny blood breakdown products cerebellum bilaterally may reflect result of prior hemorrhagic ischemia or prior trauma. Moderate white matter type changes periventricular region advanced for patient's age and most likely related to result of small vessel disease in this hypertensive obese patient. Pontine altered signal intensity may represent prominent perivascular spaces or result of prior small infarct. Mild mucosal thickening paranasal sinuses most notable inferior right maxillary sinus with maximal thickness of 5 mm. MRA HEAD Aplastic A1 segment of the left anterior cerebral artery with A2 segment of the anterior cerebral artery supplied from the right bilaterally. Irregularity of the A2 segment of the anterior cerebral bilaterally with abrupt cut off of flow of the A2 segment of the right anterior cerebral artery 1.5 cm above its origin consistent with patient's acute right anterior cerebral artery distribution infarct. There is a small bulge at the left anterior cerebral artery A1-A2 junction which may represent origin of a small vessel although difficult to completely exclude a a tiny aneurysm. The mild motion degradation somewhat limits evaluation on source images. Middle cerebral artery mild to moderate branch vessel irregularity with narrowing. Posterior fossa atherosclerotic type changes as detailed above. Electronically Signed By: Lacy Duverney M.D. On: 11/01/2014 09:36   Mr Maxine Glenn Head/brain Wo Cm  11/01/2014 CLINICAL  DATA: 49 year old hypertensive male with acute onset of left-sided weakness. Fell secondary to weakness. Subsequent encounter. EXAM: MRI HEAD WITHOUT CONTRAST MRA HEAD WITHOUT CONTRAST TECHNIQUE: Multiplanar, multiecho pulse sequences of the brain and surrounding structures were obtained without intravenous contrast. Angiographic images of the head were obtained using MRA technique without contrast. COMPARISON: 10/31/2014 brain MR and head CT. FINDINGS: MRI HEAD FINDINGS Acute moderate size nonhemorrhagic right anterior cerebral artery distribution infarct involving the medial aspect of the right frontal and parietal lobe and right aspect of the corpus callosum. Local mass effect. Tiny blood breakdown products cerebellum bilaterally may reflect result of prior hemorrhagic ischemia or prior trauma. Moderate white matter type changes periventricular region advanced for patient's age and most likely related to result of small vessel disease in this hypertensive obese patient. Other causes white matter type changes felt to be less likely considerations. Pontine altered signal intensity may represent prominent perivascular spaces or result of prior small infarct. No intracranial mass lesion noted on this unenhanced exam. No hydrocephalus. Exophthalmos. Mild mucosal thickening paranasal sinuses most notable inferior right maxillary sinus with maximal thickness of 5 mm. Mild cervical spondylotic changes C3-4 mild spinal stenosis. Cervical medullary junction, pituitary region and pineal region unremarkable. MRA HEAD FINDINGS Aplastic A1 segment of the left anterior cerebral artery with A2 segment of the anterior cerebral artery supplied from the right bilaterally. Irregularity of the A2 segment of the anterior cerebral bilaterally with abrupt cut off of flow of the A2 segment of the right anterior cerebral artery 1.5 cm above its origin consistent with patient's acute right anterior cerebral artery  distribution infarct. There is a small bulge at the left anterior cerebral artery A1-A2 junction which may represent origin of a small vessel although difficult to completely exclude a a tiny aneurysm. The mild motion degradation somewhat limits  evaluation on source images. Mild irregularity and minimal narrowing cavernous segment right internal carotid artery. Fetal type origin of the left posterior cerebral artery. Middle cerebral artery mild to moderate branch vessel irregularity with narrowing. Left vertebral artery is poorly delineated and appears to end in a posterior inferior cerebellar artery distribution. Left posterior inferior cerebellar artery with moderate tandem stenoses. Ectatic right vertebral artery without high-grade stenosis. Moderate tandem stenosis right posterior inferior cerebellar artery. Ectatic basilar artery with areas of mild to slight moderate narrowing most notable mid aspect. Nonvisualized anterior inferior cerebellar arteries. Mild to moderate irregularity superior cerebellar artery bilaterally. Mild narrowing proximal right posterior cerebral artery. Fetal type origin of the left posterior cerebral artery. Moderate narrowing portions of P2/P3 and distal branches of the posterior cerebral artery bilaterally. IMPRESSION: This exam was interpreted during a PACS downtime with limited availability of comparison cases. MRI HEAD Acute moderate size nonhemorrhagic right anterior cerebral artery distribution infarct involving the medial aspect of the right frontal and parietal lobe and right aspect of the corpus callosum. Local mass effect. Tiny blood breakdown products cerebellum bilaterally may reflect result of prior hemorrhagic ischemia or prior trauma. Moderate white matter type changes periventricular region advanced for patient's age and most likely related to result of small vessel disease in this hypertensive obese patient. Pontine altered signal intensity may  represent prominent perivascular spaces or result of prior small infarct. Mild mucosal thickening paranasal sinuses most notable inferior right maxillary sinus with maximal thickness of 5 mm. MRA HEAD Aplastic A1 segment of the left anterior cerebral artery with A2 segment of the anterior cerebral artery supplied from the right bilaterally. Irregularity of the A2 segment of the anterior cerebral bilaterally with abrupt cut off of flow of the A2 segment of the right anterior cerebral artery 1.5 cm above its origin consistent with patient's acute right anterior cerebral artery distribution infarct. There is a small bulge at the left anterior cerebral artery A1-A2 junction which may represent origin of a small vessel although difficult to completely exclude a a tiny aneurysm. The mild motion degradation somewhat limits evaluation on source images. Middle cerebral artery mild to moderate branch vessel irregularity with narrowing. Posterior fossa atherosclerotic type changes as detailed above. Electronically Signed By: Lacy Duverney M.D. On: 11/01/2014 09:36     Assessment/Plan: Diagnosis: Right ACA infarct 1. Does the need for close, 24 hr/day medical supervision in concert with the patient's rehab needs make it unreasonable for this patient to be served in a less intensive setting? Yes 2. Co-Morbidities requiring supervision/potential complications: htn 3. Due to bladder management, bowel management, safety, skin/wound care, disease management, medication administration, pain management and patient education, does the patient require 24 hr/day rehab nursing? Yes 4. Does the patient require coordinated care of a physician, rehab nurse, PT (1-2 hrs/day, 5 days/week), OT (1-2 hrs/day, 5 days/week) and SLP (1-2 hrs/day, 5 days/week) to address physical and functional deficits in the context of the above medical diagnosis(es)? Yes Addressing deficits in the following areas: balance, endurance,  locomotion, strength, transferring, bowel/bladder control, bathing, dressing, feeding, grooming, toileting, cognition, speech, swallowing and psychosocial support 5. Can the patient actively participate in an intensive therapy program of at least 3 hrs of therapy per day at least 5 days per week? Yes 6. The potential for patient to make measurable gains while on inpatient rehab is excellent 7. Anticipated functional outcomes upon discharge from inpatient rehab are min assist with PT, supervision and min assist /mod assist with OT, supervision, min assist  and mod assist with SLP. 8. Estimated rehab length of stay to reach the above functional goals is: 20-28 days 9. Does the patient have adequate social supports and living environment to accommodate these discharge functional goals? Yes 10. Anticipated D/C setting: Home 11. Anticipated post D/C treatments: HH therapy and Outpatient therapy 12. Overall Rehab/Functional Prognosis: excellent  RECOMMENDATIONS: This patient's condition is appropriate for continued rehabilitative care in the following setting: CIR Patient has agreed to participate in recommended program. Yes Note that insurance prior authorization may be required for reimbursement for recommended care.  Comment: Rehab Admissions Coordinator to follow up.  Thanks,  Ranelle Oyster, MD, [facility]     11/03/2014       Revision History     Date/Time User Provider Type Action   11/03/2014 10:50 AM Ranelle Oyster, MD Physician Sign   11/03/2014 6:32 AM Charlton Amor, PA-C Physician Assistant Pend   View Details Report       Routing History     Date/Time From To Method   11/03/2014 10:50 AM Ranelle Oyster, MD Ranelle Oyster, MD In Basket

## 2014-11-06 NOTE — Progress Notes (Signed)
Patient information reviewed and entered into e[facility] system by Alvia Jablonski, RN, CRRN, PPS Coordinator.  Information including medical coding and functional independence measure will be reviewed and updated through discharge.    

## 2014-11-06 NOTE — Evaluation (Signed)
Occupational Therapy Assessment and Plan  Patient Details  Name: [name] Short MRN: [id number] Date of Birth: 11/08/1965  OT Diagnosis: abnormal posture, cognitive deficits, disturbance of vision, flaccid hemiplegia and hemiparesis, hemiplegia affecting non-dominant side and muscle weakness (generalized) Rehab Potential: Rehab Potential (ACUTE ONLY): Good ELOS: 4 weeks   Today's Date: 11/06/2014 OT Individual Time: 9407-6808 OT Individual Time Calculation (min): 80 min     Problem List:  Patient Active Problem List   Diagnosis Date Noted  . Secondary cardiomyopathy 11/05/2014  . Malignant hypertension 11/05/2014  . Hyperlipidemia LDL goal <70 11/05/2014  . Cigarette smoker 11/05/2014  . Marijuana abuse 11/05/2014  . Hypokalemia 11/05/2014  . Left hemiparesis 11/05/2014  . Cognitive deficit, post-stroke 11/05/2014  . Left-sided weakness   . Acute right anterior cerebral artery (ACA) ischemic stroke 10/31/2014    Past Medical History:  Past Medical History  Diagnosis Date  . Hypertension     a. Previously on meds but none in several years.  . Smoker     a. Previous tobacco - quit ~ 2013. Ongoing daily marijuana usage. Occasional cigar.  . Stroke     a. 10/2014 MRI Head: acute mod size nonhemorrhagic R ant cerebral artery distribution infarct involving the medial aspect of the right frontal and parietal lobe and right aspect of the corpus callosum w/ local mass effect-->TPA;  b. 10/2014 MRA: Abrupt cut off of the R ACA A2 segment;  b. 10/2014 Carotid U/S: 1-39% bilat ICA stenosis.  . Cardiomyopathy     a. 10/2014 Echo: EF 35-40%, diff HK, sev LVH, mod dil LA, PASP 93mHg.   Past Surgical History:  Past Surgical History  Procedure Laterality Date  . Tee without cardioversion N/A 11/03/2014    Procedure: TRANSESOPHAGEAL ECHOCARDIOGRAM (TEE);  Surgeon: DLarey Dresser MD;  [facility];  Service: Cardiovascular;  Laterality: N/A;    Assessment & Plan Clinical  Impression: Patient is a 49y.o. right handed male with history of hypertension as well as remote tobacco abuse. Patient on no scheduled medications upon admission. Patient independent prior to admission living with his girlfriend working at [employer] Presented 10/31/2014 with left-sided weakness and altered mental status. Noted blood pressure 220/110. Urine drug screen positive for marijuana. MRI of the brain showed acute ischemic right ACA territory infarct. 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 Irregularity of the A2 segment of the anterior cerebral bilaterally with abrupt cut off of flow at the A2 segment of the right ACA 1.5 cm above its origin consistent with patient's acute right ACA distribution infarct. Carotid Dopplers were no ICA stenosis. Echocardiogram with ejection fraction 35-40% with diffuse hypokinesis. Venous Doppler studies lower extremities negative for DVT. Neurology consulted patient did receive TPA. Presently maintained on aspirin/Plavix for CVA prophylaxis as well as subcutaneous Lovenox for DVT prophylaxis. TEE showed ejection fraction of 35% without embolus or thrombus. No plan at this time for loop recorder and considering outpatient 30 day monitoring. Blood pressure has been monitored closely as initially patient did receive Cardene drip. A renal artery duplex was completed showing no evidence of renal artery stenosis bilaterally. Speech therapy follow-up noted poor attention to the left as well as limited awareness of deficits. He is tolerating a mechanical soft diet. Physical and Occupational therapy evaluations completed with recommendations of physical medicine rehabilitation.   Patient transferred to CIR on 11/05/2014 .  Patient currently requires total with basic self-care skills secondary to muscle weakness, impaired timing and sequencing,  abnormal tone, unbalanced muscle activation and decreased coordination, decreased visual perceptual skills, decreased attention to left, decreased initiation, decreased attention, decreased awareness, decreased problem solving, decreased safety awareness and delayed processing and decreased sitting balance, decreased standing balance, decreased postural control and hemiplegia.  Prior to hospitalization, patient could complete ADLs with independent .  Patient will benefit from skilled intervention to decrease level of assist with basic self-care skills prior to discharge home with care partner.  Anticipate patient will require 24 hour supervision and minimal physical assistance and follow up home health.  OT Assessment Rehab Potential (ACUTE ONLY): Good OT Patient demonstrates impairments in the following area(s): Balance;Cognition;Endurance;Motor;Perception;Safety;Sensory;Vision OT Basic ADL's Functional Problem(s): Grooming;Bathing;Dressing;Toileting;Eating OT Transfers Functional Problem(s): Toilet;Tub/Shower OT Additional Impairment(s): Fuctional Use of Upper Extremity OT Plan OT Intensity: Minimum of 1-2 x/day, 45 to 90 minutes OT Frequency: 5 out of 7 days OT Duration/Estimated Length of Stay: 4 weeks OT Treatment/Interventions: Balance/vestibular training;Cognitive remediation/compensation;Discharge planning;Disease mangement/prevention;DME/adaptive equipment instruction;Functional mobility training;Neuromuscular re-education;Pain management;Patient/family education;Psychosocial support;Self Care/advanced ADL retraining;Splinting/orthotics;Therapeutic Activities;Therapeutic Exercise;UE/LE Strength taining/ROM;UE/LE Coordination activities;Visual/perceptual remediation/compensation OT Self Feeding Anticipated Outcome(s): Min assist OT Basic Self-Care Anticipated Outcome(s): Min-mod assist OT Toileting Anticipated Outcome(s): Mod assist OT Bathroom Transfers Anticipated Outcome(s): Min  assist OT Recommendation Recommendations for Other Services: Neuropsych consult Patient destination: Home Follow Up Recommendations: Home health OT;24 hour supervision/assistance Equipment Recommended: 3 in 1 bedside comode;Tub/shower bench   Skilled Therapeutic Intervention OT eval completed with education on OT purpose, POC, rehab process, and goals.  ADL assessment completed at bed level with focus on initiation, following directions, attention to task, and bed mobility.  Pt with loose gross finger flexion in Lt and noted trace movement in biceps/triceps but flaccid at shoulder. +2 for rolling in bed to pt's Rt and mod assist when rolling to Lt after total assist for positioning of LUE and LLE.  Max-total assist supine > sit and static sitting balance.  Pt with decreased initiation and problem solving with UB dressing while seated at EOB, but able to pull shirt over head once arms threaded by therapist with 2nd person maintaining upright sitting balance.  Pt with Rt gaze preference throughout session with therapist positioned to pt's Lt to promote scanning and attending to Lt visual field.  OT Evaluation Precautions/Restrictions  Precautions Precautions: Fall Precaution Comments: Rt gaze preference, decreased attention to Lt Restrictions Weight Bearing Restrictions: No Pain Pain Assessment Pain Assessment: No/denies pain Home Living/Prior Functioning Home Living Available Help at Discharge: Family (according to chart review, significant other is a CNA but unsure how much assist she can provide) Type of Home: Apartment Home Access: Level entry Home Layout: One level Additional Comments: unable to get detailed history due to pt requiring increased time for processing and initiation  Lives With: Significant other ADL  See FIM Vision/Perception  Vision- History Baseline Vision/History: No visual deficits Vision- Assessment Vision Assessment?: Vision impaired- to be further tested in  functional context Additional Comments: Pt able to read clock on wall.  He has a right gaze preference, but will look to the Lt when cued.  He requires increased time to initiate movement.  Cognition Overall Cognitive Status: Impaired/Different from baseline Arousal/Alertness: Awake/alert Orientation Level: Oriented to person;Oriented to place;Disoriented to time;Oriented to situation Attention: Sustained Sustained Attention: Impaired Awareness: Impaired Awareness Impairment: Intellectual impairment Problem Solving: Impaired Safety/Judgment: Impaired Sensation Sensation Proprioception: Impaired by gross assessment Coordination Gross Motor Movements are Fluid and  Coordinated: No Fine Motor Movements are Fluid and Coordinated: No Finger Nose Finger Test: unable to assess due to flaccid RUE 9 Hole Peg Test: unable to assess due to flaccid RUE Extremity/Trunk Assessment RUE Assessment RUE Assessment: Within Functional Limits LUE Assessment LUE Assessment: Exceptions to [facility] (PROM WFL, flaccid shoulder, trace bicep/tricep, finger flexion to loose gross grasp but no active extension)  FIM:  FIM - Grooming Grooming Steps: Wash, rinse, dry face Grooming: 2: Patient completes 1 of 4 or 2 of 5 steps FIM - Bathing Bathing Steps Patient Completed: Chest;Right upper leg Bathing: 1: Two helpers FIM - Upper Body Dressing/Undressing Upper body dressing/undressing steps patient completed: Put head through opening of pull over shirt/dress Upper body dressing/undressing: 2: Max-Patient completed 25-49% of tasks FIM - Lower Body Dressing/Undressing Lower body dressing/undressing: 1: Two helpers FIM - Control and instrumentation engineer Devices: Bed rails Bed/Chair Transfer: 1: Supine > Sit: Total A (helper does all/Pt. < 25%);1: Sit > Supine: Total A (helper does all/Pt. < 25%) FIM - Tub/Shower Transfers Tub/shower Transfers: 0-Activity did not occur or was simulated   Refer to  Care Plan for Long Term Goals  Recommendations for other services: Neuropsych  Discharge Criteria: Patient will be discharged from OT if patient refuses treatment 3 consecutive times without medical reason, if treatment goals not met, if there is a change in medical status, if patient makes no progress towards goals or if patient is discharged from hospital.  The above assessment, treatment plan, treatment alternatives and goals were discussed and mutually agreed upon: by patient  Simonne Come 11/06/2014, 10:21 AM

## 2014-11-07 ENCOUNTER — Inpatient Hospital Stay (HOSPITAL_COMMUNITY): Payer: Medicaid Other | Admitting: Speech Pathology

## 2014-11-07 ENCOUNTER — Inpatient Hospital Stay (HOSPITAL_COMMUNITY): Payer: Medicaid Other | Admitting: Occupational Therapy

## 2014-11-07 ENCOUNTER — Inpatient Hospital Stay (HOSPITAL_COMMUNITY): Payer: Self-pay | Admitting: Physical Therapy

## 2014-11-07 MED ORDER — CHLORHEXIDINE GLUCONATE 0.12 % MT SOLN
15.0000 mL | Freq: Four times a day (QID) | OROMUCOSAL | Status: DC
  Administered 2014-11-07 – 2014-12-03 (×80): 15 mL via OROMUCOSAL
  Filled 2014-11-07 (×110): qty 15

## 2014-11-07 NOTE — Progress Notes (Signed)
Physical Therapy Session Note  Patient Details  Name: [name] Short MRN: [id number] Date of Birth: 08/09/1965  Today's Date: 11/07/2014 PT Individual Time: [id number] PT Individual Time Calculation (min): 45 min   Short Term Goals: Week 1:  PT Short Term Goal 1 (Week 1): Pt will perform rolling R with bed rail max A and 100% cues. PT Short Term Goal 2 (Week 1): Pt will perform rolling L with bed rail mod A and 75% cues.  PT Short Term Goal 3 (Week 1): Pt will perform supine to sit total A of single therapist with 100% cues and HOB flat using rail. PT Short Term Goal 4 (Week 1): Pt will perform sit to supine total A of 1 with 100% and HOB flat. PT Short Term Goal 5 (Week 1): Pt will perform dynamic sitting balance with distractions for 2 minutes with bilat LE support, RUE support requiring max A.    Skilled Therapeutic Interventions/Progress Updates:   Pt with inconsistent performance in session secondary to attention and cognitive deficits. Pt benefits from cues for anterior weight shift in sitting for improved sit to stands.  Pt would continue to benefit from skilled PT services to increase functional mobility.  Therapy Documentation Precautions:  Precautions Precautions: Fall Precaution Comments: Rt gaze preference, decreased attention to Lt Restrictions Weight Bearing Restrictions: No Pain: None reported Mobility:  Pt performs transfers Max A to Dependent+2 Other Treatments:  Pt performs transfers x15 in session. Pt educated on rehab plan, safety in mobility, deficits, and attending to R side. Pt managed with decreased demands when fatigued, frequent cog rests, and increased time allowance for processing. Pt performs static standing 1'x2 with cues for neutral spine. Pt performs RLE advancement pregait 2x3. Pt performs static and dynamic sitting activities with LUE forced use weight bearing on foam including cone reaching 3x5 superiorly and superior and to R. R sided reaching also  performed in standing 2x10.   See FIM for current functional status  Therapy/Group: Individual Therapy  [name] Short, [name] Short 11/07/2014, 12:20 PM

## 2014-11-07 NOTE — Progress Notes (Signed)
Subjective/Complaints:  49 y.o.right handed  male with history of hypertension as well as remote tobacco abuse. Patient on no scheduled medications upon admission. Patient independent prior to admission living with his girlfriend working at [employer]. Presented 10/31/2014 with left-sided weakness and altered mental status. Noted blood pressure 220/110. Urine drug screen positive for marijuana. MRI of the brain showed acute ischemic right ACA territory infarct. 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 Irregularity of the A2 segment of the anterior cerebral bilaterally with abrupt cut off of flow at the A2 segment of the right ACA 1.5 cm above its origin consistent with patient's acute right ACA distribution infarct. Carotid Dopplers were no ICA stenosis. Echocardiogram with ejection fraction 35-40% with diffuse hypokinesis. Venous Doppler studies lower extremities negative for DVT. Neurology consulted patient did  receive TPA. Presently maintained on aspirin/Plavix   Patient with quiet night. Breath with strong/foul odor in room   Objective: Vital Signs: Blood pressure 150/101, pulse 66, temperature 98.1 F (36.7 C), temperature source Oral, resp. rate 18, weight 90.8 kg (200 lb 2.8 oz), SpO2 94 %. No results found. Results for orders placed or performed during the hospital encounter of 11/05/14 (from the past 72 hour(s))  CBC     Status: None   Collection Time: 11/05/14  5:40 PM  Result Value Ref Range   WBC 6.4 4.0 - 10.5 K/uL   RBC 4.71 4.22 - 5.81 MIL/uL   Hemoglobin 14.1 13.0 - 17.0 g/dL   HCT 42.8 39.0 - 52.0 %   MCV 90.9 78.0 - 100.0 fL   MCH 29.9 26.0 - 34.0 pg   MCHC 32.9 30.0 - 36.0 g/dL   RDW 14.2 11.5 - 15.5 %   Platelets 178 150 - 400 K/uL  Creatinine, serum     Status: Abnormal   Collection Time: 11/05/14  5:40 PM  Result Value Ref Range    Creatinine, Ser 1.18 0.50 - 1.35 mg/dL   GFR calc non Af Amer 72 (L) >90 mL/min   GFR calc Af Amer 83 (L) >90 mL/min    Comment: (NOTE) The eGFR has been calculated using the CKD EPI equation. This calculation has not been validated in all clinical situations. eGFR's persistently <90 mL/min signify possible Chronic Kidney Disease.   CBC WITH DIFFERENTIAL     Status: Abnormal   Collection Time: 11/06/14  6:11 AM  Result Value Ref Range   WBC 6.7 4.0 - 10.5 K/uL   RBC 4.61 4.22 - 5.81 MIL/uL   Hemoglobin 13.6 13.0 - 17.0 g/dL   HCT 42.7 39.0 - 52.0 %   MCV 92.6 78.0 - 100.0 fL   MCH 29.5 26.0 - 34.0 pg   MCHC 31.9 30.0 - 36.0 g/dL   RDW 14.3 11.5 - 15.5 %   Platelets 169 150 - 400 K/uL   Neutrophils Relative % 61 43 - 77 %   Neutro Abs 4.1 1.7 - 7.7 K/uL   Lymphocytes Relative 16 12 - 46 %   Lymphs Abs 1.1 0.7 - 4.0 K/uL   Monocytes Relative 14 (H) 3 - 12 %   Monocytes Absolute 0.9 0.1 - 1.0 K/uL   Eosinophils Relative 9 (H) 0 - 5 %   Eosinophils Absolute 0.6 0.0 - 0.7 K/uL   Basophils Relative 0 0 - 1 %   Basophils Absolute 0.0 0.0 - 0.1 K/uL  Comprehensive  metabolic panel     Status: Abnormal   Collection Time: 11/06/14  6:11 AM  Result Value Ref Range   Sodium 137 135 - 145 mmol/L   Potassium 4.2 3.5 - 5.1 mmol/L   Chloride 104 96 - 112 mmol/L   CO2 23 19 - 32 mmol/L   Glucose, Bld 83 70 - 99 mg/dL   BUN 15 6 - 23 mg/dL   Creatinine, Ser 1.12 0.50 - 1.35 mg/dL   Calcium 8.9 8.4 - 10.5 mg/dL   Total Protein 8.6 (H) 6.0 - 8.3 g/dL   Albumin 3.0 (L) 3.5 - 5.2 g/dL   AST 45 (H) 0 - 37 U/L   ALT 19 0 - 53 U/L   Alkaline Phosphatase 64 39 - 117 U/L   Total Bilirubin 1.1 0.3 - 1.2 mg/dL   GFR calc non Af Amer 77 (L) >90 mL/min   GFR calc Af Amer 89 (L) >90 mL/min    Comment: (NOTE) The eGFR has been calculated using the CKD EPI equation. This calculation has not been validated in all clinical situations. eGFR's persistently <90 mL/min signify possible Chronic  Kidney Disease.    Anion gap 10 5 - 15     HEENT: normal and extensive gold, halitosis Cardio: RRR and no murmurs Resp: CTA B/L and unlabored GI: BS positive and nontender nondistended Extremity:  No Edema Skin:   Intact Neuro: Lethargic, Flat, Abnormal Sensory reduced sensation in the left lower extremity greater than left upper extremity to pinch, difficult to assess secondary to affect, Abnormal Motor 5/5 in the right deltoid, biceps, triceps, grip, hip flexor, knee extensor, ankle dorsiflexor and plantar flexor and Inattention Musc/Skel:  Other no pain with range of motion of the left upper or left lower limb. Gen. no acute distress   Assessment/Plan: 1. Functional deficits secondary to right ACA infarct with left hemiparesis as well as cognitive deficits which require 3+ hours per day of interdisciplinary therapy in a comprehensive inpatient rehab setting. Physiatrist is providing close team supervision and 24 hour management of active medical problems listed below. Physiatrist and rehab team continue to assess barriers to discharge/monitor patient progress toward functional and medical goals. FIM: FIM - Bathing Bathing Steps Patient Completed: Chest, Right upper leg Bathing: 1: Two helpers  FIM - Upper Body Dressing/Undressing Upper body dressing/undressing steps patient completed: Put head through opening of pull over shirt/dress Upper body dressing/undressing: 2: Max-Patient completed 25-49% of tasks FIM - Lower Body Dressing/Undressing Lower body dressing/undressing: 1: Two helpers        FIM - Control and instrumentation engineer Devices: Bed rails, HOB elevated (HOB elevated for supine>sit, HOB flat for sit>supine) Bed/Chair Transfer: 1: Two helpers (+2 for safety)  FIM - Locomotion: Wheelchair Locomotion: Wheelchair: 0: Activity did not occur (Unsafe due to pt lethargy) FIM - Locomotion: Ambulation Locomotion: Ambulation: 0: Activity did not occur  (Unsafe due to pt lethargy)  Comprehension Comprehension Mode: Auditory Comprehension: 2-Understands basic 25 - 49% of the time/requires cueing 51 - 75% of the time  Expression Expression Mode: Verbal Expression: 4-Expresses basic 75 - 89% of the time/requires cueing 10 - 24% of the time. Needs helper to occlude trach/needs to repeat words.  Social Interaction Social Interaction: 4-Interacts appropriately 75 - 89% of the time - Needs redirection for appropriate language or to initiate interaction.  Problem Solving Problem Solving: 2-Solves basic 25 - 49% of the time - needs direction more than half the time to initiate, plan or complete simple activities  Memory Memory: 3-Recognizes or recalls 50 - 74% of the time/requires cueing 25 - 49% of the time  Medical Problem List and Plan: 1. Functional deficits secondary to right ACA infarct embolic secondary to unknown source 2.  DVT Prophylaxis/Anticoagulation: Subcutaneous Lovenox. Monitor platelet counts of any signs of bleeding. Venous Doppler studies negative 3. Pain Management: Tylenol as needed 4. Dysphagia/left side inattention. Mechanical soft diet. Follow-up speech therapy 5. Neuropsych: This patient is capable of making decisions on his own behalf. 6. Skin/Wound Care: Routine skin checks 7. Fluids/Electrolytes/Nutrition: encourage po. Checking labs 8. Hypertension with poor medical compliance. Norvasc 10 mg daily, Coreg 6.25 mg twice a day, lisinopril 40 mg daily. Monitor with increased mobility 9. Urine drug screen positive for marijuana. Counseling 10. Hyperlipidemia. Lipitor   LOS (Days) 2 A FACE TO FACE EVALUATION WAS PERFORMED  Taneshia Lorence T 11/07/2014, 8:50 AM

## 2014-11-07 NOTE — Progress Notes (Signed)
Speech Language Pathology Daily Session Note  Patient Details  Name: [name] Short MRN: [id number] Date of Birth: 08/09/1965  Today's Date: 11/07/2014 SLP Individual Time: 1300-1345 SLP Individual Time Calculation (min): 45 min  Short Term Goals: Week 1: SLP Short Term Goal 1 (Week 1): Pt will tolerate dys 3 diet with thin liquids using comp strategies with min A. SLP Short Term Goal 2 (Week 1): Pt will be oriented to person, place, time, situation with external supports with mod A. SLP Short Term Goal 3 (Week 1): Pt will follow basic 2 step commands in fuctional tasks with mod A. SLP Short Term Goal 4 (Week 1): Pt will sustain attention to a functional task for 5-10 minutes with mod A. SLP Short Term Goal 5 (Week 1): Pt will attend to the L side in basic functional task with mod A. SLP Short Term Goal 6 (Week 1): Pt will identify 2 cognitive deficits and 2 physical deficits with mod A.  Skilled Therapeutic Interventions: Skilled treatment session focused on dysphagia and cognitive goals. SLP facilitated session by providing Mod A verbal cues for the use of small bites/sips and a slow rate of self-feeding with lunch meal of Dys. 3 textures with thin liquids via cup. Patient consumed meal without overt s/s of aspiration. Patient demonstrated divided attention to self-feeding and functional conversation with Min A verbal cues for redirection and made eye contact with clinician in his left field of environment throughout 25% of the conversation. Approximately halfway through the session, patient demonstrated an increase in delayed responses and demonstrated decreased verbal expression and required overall Max A multimodal cues for attention to task. Patient left in room with all needs within reach and quick release belt in place. Continue with current plan of care.    FIM:  Comprehension Comprehension Mode: Auditory Comprehension: 2-Understands basic 25 - 49% of the time/requires cueing 51 - 75%  of the time Expression Expression Mode: Verbal Expression: 3-Expresses basic 50 - 74% of the time/requires cueing 25 - 50% of the time. Needs to repeat parts of sentences. Social Interaction Social Interaction: 2-Interacts appropriately 25 - 49% of time - Needs frequent redirection. Problem Solving Problem Solving: 2-Solves basic 25 - 49% of the time - needs direction more than half the time to initiate, plan or complete simple activities Memory Memory: 2-Recognizes or recalls 25 - 49% of the time/requires cueing 51 - 75% of the time FIM - Eating Eating Activity: 5: Set-up assist for cut food;5: Needs verbal cues/supervision;4: Helper checks for pocketed food;5: Supervision/cues;5: Set-up assist for open containers  Pain Pain Assessment Pain Assessment: No/denies pain  Therapy/Group: Individual Therapy  [name] Short 11/07/2014, 4:12 PM

## 2014-11-07 NOTE — Progress Notes (Signed)
Occupational Therapy Session Note  Patient Details  Name: [name] Short MRN: [id number] Date of Birth: June 19, 1966  Today's Date: 11/07/2014 OT Individual Time:  -   0830-0930  (60 min)  1st session      Short Term Goals: Week 1:  OT Short Term Goal 1 (Week 1): Pt will complete bathing with max assist of 1 caregiver at bed level to EOB OT Short Term Goal 2 (Week 1): Pt will complete UB dressing with mod assist OT Short Term Goal 3 (Week 1): Pt will maintain static sititng balance at EOB for 1 min in preparation for self-care task OT Short Term Goal 4 (Week 1): Pt will complete toilet transfer to drop arm BSC with max assist   Skilled Therapeutic Interventions/Progress Updates:      Focus of treatment was bed mobility, transfers,  Neuro-muscular reeducation, sitting balance, standing balance, attention, therapeutic activities, sustained attention, postural control   Pt. Asleep in bed upon OT arrival   Performed bathing and dressing EOB with increased time for processing and initiation.  Pt maintained sitting balance with min assist by holding to foot of bed for 50 % of session.  He needed cues to maintain erect posture and not lean to one side.  Pt. Leaned over to reach right foot for bathing. With min assist for balance.  Pt sat EOB for 30 minutes during session.  .  Increased foly pain when crossing legs at knee.   Pt did lateral scoot to right side with total assist +2, pt = 30 %.   Sit to stand with max assist and max assist for postural control and balance.  Stood for 40 sec but unable to pivot.  Did lateral scoot instead to wc as noted above.  Left pt in wc with safety belt on and call bell,phone within reach.     Therapy Documentation Precautions:  Precautions Precautions: Fall Precaution Comments: Rt gaze preference, decreased attention to Lt Restrictions Weight Bearing Restrictions: No   BP Sit Edge of Bed: 154/84 mmHg 1st session BP2 Sit Edge of Bed:  166/105 mmHg  1st  session  Pain:  5/10   Increased foly pain           Other Treatments:    2nd treatment:  Time:  1515- 1600  (60 min) Pain:  5/10 Foly pain Individual session  .  Pt. Lying in bed upon OT arrival.  Addressed bed mobility, sitting balance, sit to stand, transfers to Dropped arm commode chair.  Pt. Went from supine to EOB with max assist.  Performed transfer to dropped arm commode chair with total +2, scoot technique.  Pt. Sat on BSC with  Max cues for sustained attention and min assist for balance.  He was distracted by phone, and tv.  He persisted on calling wife whose call he had missed.  OT unable to reason with him to focus on his therapy and call his wife when he is finished.  He finally got his wife on phone and verbalized frustration of not being able to make calls.  He was frustrated about foley as well.  Pt had BM and was able to wipe self with mod assist for balance.  Pt. Stated he only wanted to use bed pan.  Explained that getting on [facility] was progressing towards getting better.  Pt did not verbalize understanding.  Transferred back to bed and left with all needs in reach.     See FIM for current functional status  Therapy/Group: Individual  Therapy  [name] Short, [name] Short 11/07/2014, 8:26 AM

## 2014-11-08 ENCOUNTER — Ambulatory Visit (HOSPITAL_COMMUNITY): Payer: Self-pay | Admitting: Occupational Therapy

## 2014-11-08 ENCOUNTER — Encounter (HOSPITAL_COMMUNITY): Payer: Self-pay

## 2014-11-08 DIAGNOSIS — I10 Essential (primary) hypertension: Secondary | ICD-10-CM

## 2014-11-08 MED ORDER — HYDROCHLOROTHIAZIDE 12.5 MG PO CAPS
12.5000 mg | ORAL_CAPSULE | Freq: Every day | ORAL | Status: DC
  Administered 2014-11-08 – 2014-11-10 (×3): 12.5 mg via ORAL
  Filled 2014-11-08 (×4): qty 1

## 2014-11-08 MED ORDER — TRAZODONE HCL 50 MG PO TABS
50.0000 mg | ORAL_TABLET | Freq: Every evening | ORAL | Status: DC | PRN
  Administered 2014-11-08 – 2014-11-15 (×8): 50 mg via ORAL
  Filled 2014-11-08 (×8): qty 1

## 2014-11-08 NOTE — Progress Notes (Signed)
Subjective/Complaints:  49 y.o.right handed  male with history of hypertension as well as remote tobacco abuse. Patient on no scheduled medications upon admission. Patient independent prior to admission living with his girlfriend working at [employer]. Presented 10/31/2014 with left-sided weakness and altered mental status. Noted blood pressure 220/110. Urine drug screen positive for marijuana. MRI of the brain showed acute ischemic right ACA territory infarct. 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 Irregularity of the A2 segment of the anterior cerebral bilaterally with abrupt cut off of flow at the A2 segment of the right ACA 1.5 cm above its origin consistent with patient's acute right ACA distribution infarct. Carotid Dopplers were no ICA stenosis. Echocardiogram with ejection fraction 35-40% with diffuse hypokinesis. Venous Doppler studies lower extremities negative for DVT. Neurology consulted patient did  receive TPA. Presently maintained on aspirin/Plavix   No new issues. Rested well. Needs cueing for engagement/sustained attention   Objective: Vital Signs: Blood pressure 149/101, pulse 67, temperature 98.6 F (37 C), temperature source Oral, resp. rate 18, weight 90.8 kg (200 lb 2.8 oz), SpO2 100 %. No results found. Results for orders placed or performed during the hospital encounter of 11/05/14 (from the past 72 hour(s))  CBC     Status: None   Collection Time: 11/05/14  5:40 PM  Result Value Ref Range   WBC 6.4 4.0 - 10.5 K/uL   RBC 4.71 4.22 - 5.81 MIL/uL   Hemoglobin 14.1 13.0 - 17.0 g/dL   HCT 42.8 39.0 - 52.0 %   MCV 90.9 78.0 - 100.0 fL   MCH 29.9 26.0 - 34.0 pg   MCHC 32.9 30.0 - 36.0 g/dL   RDW 14.2 11.5 - 15.5 %   Platelets 178 150 - 400 K/uL  Creatinine, serum     Status: Abnormal   Collection Time: 11/05/14  5:40 PM  Result Value Ref Range    Creatinine, Ser 1.18 0.50 - 1.35 mg/dL   GFR calc non Af Amer 72 (L) >90 mL/min   GFR calc Af Amer 83 (L) >90 mL/min    Comment: (NOTE) The eGFR has been calculated using the CKD EPI equation. This calculation has not been validated in all clinical situations. eGFR's persistently <90 mL/min signify possible Chronic Kidney Disease.   CBC WITH DIFFERENTIAL     Status: Abnormal   Collection Time: 11/06/14  6:11 AM  Result Value Ref Range   WBC 6.7 4.0 - 10.5 K/uL   RBC 4.61 4.22 - 5.81 MIL/uL   Hemoglobin 13.6 13.0 - 17.0 g/dL   HCT 42.7 39.0 - 52.0 %   MCV 92.6 78.0 - 100.0 fL   MCH 29.5 26.0 - 34.0 pg   MCHC 31.9 30.0 - 36.0 g/dL   RDW 14.3 11.5 - 15.5 %   Platelets 169 150 - 400 K/uL   Neutrophils Relative % 61 43 - 77 %   Neutro Abs 4.1 1.7 - 7.7 K/uL   Lymphocytes Relative 16 12 - 46 %   Lymphs Abs 1.1 0.7 - 4.0 K/uL   Monocytes Relative 14 (H) 3 - 12 %   Monocytes Absolute 0.9 0.1 - 1.0 K/uL   Eosinophils Relative 9 (H) 0 - 5 %   Eosinophils Absolute 0.6 0.0 - 0.7 K/uL   Basophils Relative 0 0 - 1 %   Basophils Absolute 0.0 0.0 - 0.1 K/uL  Comprehensive  metabolic panel     Status: Abnormal   Collection Time: 11/06/14  6:11 AM  Result Value Ref Range   Sodium 137 135 - 145 mmol/L   Potassium 4.2 3.5 - 5.1 mmol/L   Chloride 104 96 - 112 mmol/L   CO2 23 19 - 32 mmol/L   Glucose, Bld 83 70 - 99 mg/dL   BUN 15 6 - 23 mg/dL   Creatinine, Ser 1.12 0.50 - 1.35 mg/dL   Calcium 8.9 8.4 - 10.5 mg/dL   Total Protein 8.6 (H) 6.0 - 8.3 g/dL   Albumin 3.0 (L) 3.5 - 5.2 g/dL   AST 45 (H) 0 - 37 U/L   ALT 19 0 - 53 U/L   Alkaline Phosphatase 64 39 - 117 U/L   Total Bilirubin 1.1 0.3 - 1.2 mg/dL   GFR calc non Af Amer 77 (L) >90 mL/min   GFR calc Af Amer 89 (L) >90 mL/min    Comment: (NOTE) The eGFR has been calculated using the CKD EPI equation. This calculation has not been validated in all clinical situations. eGFR's persistently <90 mL/min signify possible Chronic  Kidney Disease.    Anion gap 10 5 - 15     HEENT: normal and extensive gold, halitosis Cardio: RRR and no murmurs Resp: CTA B/L and unlabored GI: BS positive and nontender nondistended Extremity:  No Edema Skin:   Intact Neuro: Lethargic, Flat, Abnormal Sensory reduced sensation in the left lower extremity greater than left upper extremity to pinch, difficult to assess secondary to affect, Abnormal Motor 5/5 in the right deltoid, biceps, triceps, grip, hip flexor, knee extensor, ankle dorsiflexor and plantar flexor and Inattention Musc/Skel:  Other no pain with range of motion of the left upper or left lower limb. Gen. no acute distress   Assessment/Plan: 1. Functional deficits secondary to right ACA infarct with left hemiparesis as well as cognitive deficits which require 3+ hours per day of interdisciplinary therapy in a comprehensive inpatient rehab setting. Physiatrist is providing close team supervision and 24 hour management of active medical problems listed below. Physiatrist and rehab team continue to assess barriers to discharge/monitor patient progress toward functional and medical goals. FIM: FIM - Bathing Bathing Steps Patient Completed: Chest, Right upper leg, Abdomen Bathing: 1: Two helpers  FIM - Upper Body Dressing/Undressing Upper body dressing/undressing steps patient completed: Put head through opening of pull over shirt/dress Upper body dressing/undressing: 2: Max-Patient completed 25-49% of tasks FIM - Lower Body Dressing/Undressing Lower body dressing/undressing: 1: Two helpers  FIM - Toileting Toileting steps completed by patient: Performs perineal hygiene Toileting: 2: Max-Patient completed 1 of 3 steps  FIM - Radio producer Devices: Recruitment consultant Transfers: 1-Two helpers  FIM - Control and instrumentation engineer Devices: Bed rails, HOB elevated Bed/Chair Transfer: 1: Two helpers  FIM - Locomotion:  Wheelchair Locomotion: Wheelchair: 0: Activity did not occur FIM - Locomotion: Ambulation Locomotion: Ambulation: 0: Activity did not occur  Comprehension Comprehension Mode: Auditory Comprehension: 2-Understands basic 25 - 49% of the time/requires cueing 51 - 75% of the time  Expression Expression Mode: Verbal Expression: 3-Expresses basic 50 - 74% of the time/requires cueing 25 - 50% of the time. Needs to repeat parts of sentences.  Social Interaction Social Interaction: 2-Interacts appropriately 25 - 49% of time - Needs frequent redirection.  Problem Solving Problem Solving: 2-Solves basic 25 - 49% of the time - needs direction more than half the time to initiate, plan or complete simple activities  Memory Memory: 2-Recognizes or recalls 25 - 49% of the time/requires cueing 51 - 75% of the time  Medical Problem List and Plan: 1. Functional deficits secondary to right ACA infarct embolic secondary to unknown source 2.  DVT Prophylaxis/Anticoagulation: Subcutaneous Lovenox. Monitor platelet counts of any signs of bleeding. Venous Doppler studies negative 3. Pain Management: Tylenol as needed 4. Dysphagia/left side inattention. Mechanical soft diet. Follow-up speech therapy 5. Neuropsych: This patient is capable of making decisions on his own behalf. 6. Skin/Wound Care: nutrition/skin checks 7. Fluids/Electrolytes/Nutrition: encourage po. Checking labs 8. Hypertension with poor medical compliance. Norvasc 10 mg daily, Coreg 6.25 mg twice a day, lisinopril 40 mg daily.   -DBP remains quite high- add low dose hctz (po intake has been good) 9. Urine drug screen positive for marijuana. Counseling 10. Hyperlipidemia. Lipitor   LOS (Days) 3 A FACE TO FACE EVALUATION WAS PERFORMED  Iley Deignan T 11/08/2014, 8:56 AM

## 2014-11-08 NOTE — Progress Notes (Signed)
Occupational Therapy Session Note  Patient Details  Name: [name] Short Sheppard MRN: [id number] Date of Birth: 08/09/1965  Today's Date: 11/08/2014 OT Individual Time: [id number] OT Individual Time Calculation (min): 60 min    Short Term Goals: Week 1:  OT Short Term Goal 1 (Week 1): Pt will complete bathing with max assist of 1 caregiver at bed level to EOB OT Short Term Goal 2 (Week 1): Pt will complete UB dressing with mod assist OT Short Term Goal 3 (Week 1): Pt will maintain static sititng balance at EOB for 1 min in preparation for self-care task OT Short Term Goal 4 (Week 1): Pt will complete toilet transfer to drop arm BSC with max assist   Skilled Therapeutic Interventions/Progress Updates:    Pt engaged in BADL retraining including bathing and dressing with sit<>stand from EOB.  Pt required max A for supine to sit EOB.  Pt required mod A with max multimodal cues to maintain sitting balance at midline.  Pt frequently exhibited LOB to left and overcompensated to right when cued to correct sitting posture.  Pt's head tlilted to right much of session and pt exhibited difficulty maintaining head at midline.  Pt required mod verbal cues to attend to tasks and redirect to tasks.  Pt required tot A + 2 (pt=50%) for sit<>stand and standing balance to facilitate bathing buttocks and pulling up pants.  Pt performed squat pivot transfers (modified [place] to facilitate forward lean) with mod A.  Focus on bed mobility, sitting balance, sit<>stand, standing balance, attention to left, hemi bathing and dressing techniques, attention to task, task initiation, sequencing, functional transfers, and safety awareness.  Therapy Documentation Precautions:  Precautions Precautions: Fall Precaution Comments: Rt gaze preference, decreased attention to Lt Restrictions Weight Bearing Restrictions: No  Pain:  Pt denied pain  See FIM for current functional status  Therapy/Group: Individual Therapy  Rich BraveLanier,  Amoria Mclees Chappell 11/08/2014, 11:17 AM

## 2014-11-08 NOTE — IPOC Note (Signed)
Overall Plan of Care [facility]) Patient Details Name: [name] Short MRN: [id number] DOB: 06-Nov-1965  Admitting Diagnosis: R ACA infarct   Hospital Problems: Active Problems:   Acute right anterior cerebral artery (ACA) ischemic stroke   Left hemiparesis   Cognitive deficit, post-stroke     Functional Problem List: Nursing Bladder, Bowel, Edema, Endurance, Medication Management, Pain, Safety, Sensory, Skin Integrity  PT Balance, Endurance, Motor, Pain, Safety, Sensory, Skin Integrity  OT Balance, Cognition, Endurance, Motor, Perception, Safety, Sensory, Vision  SLP Cognition, Nutrition, Perception, Safety  TR         Basic ADL's: OT Grooming, Bathing, Dressing, Toileting, Eating     Advanced  ADL's: OT       Transfers: PT Bed Mobility, Bed to Chair, Car, Occupational psychologist, Research scientist (life sciences): PT Ambulation, Psychologist, prison and probation services, Stairs     Additional Impairments: OT Fuctional Use of Upper Extremity  SLP Swallowing, Communication, Social Cognition comprehension, expression Social Interaction, Problem Solving, Memory, Attention, Awareness  TR      Anticipated Outcomes Item Anticipated Outcome  Self Feeding Min assist  Swallowing  Pt will tolerate least restrictive diet with supervision.   Basic self-care  Min-mod assist  Toileting  Mod assist   Bathroom Transfers Min assist  Bowel/Bladder  mod assist  Transfers  Min A   Locomotion  Min A at a w/c level; Mod A ambulation  Communication     Cognition  Pt will be oriented with min A, comprehend basic auditory information with min A, demonstrate basic prob-solving with mod A, sustain attention with mod A, and demonstrate safety awareness with min A  Pain  <2 on a 0-10 scale  Safety/Judgment  mod assist for safety   Therapy Plan: PT Intensity: Minimum of 1-2 x/day ,45 to 90 minutes PT Frequency: 5 out of 7 days PT Duration Estimated Length of Stay: 21-28 days OT Intensity: Minimum of 1-2 x/day, 45  to 90 minutes OT Frequency: 5 out of 7 days OT Duration/Estimated Length of Stay: 4 weeks SLP Intensity: Minumum of 1-2 x/day, 30 to 90 minutes SLP Frequency: 3 to 5 out of 7 days SLP Duration/Estimated Length of Stay: 20-25 days       Team Interventions: Nursing Interventions Patient/Family Education, Bladder Management, Bowel Management, Disease Management/Prevention, Pain Management, Medication Management, Skin Care/Wound Management, Discharge Planning, Psychosocial Support  PT interventions Balance/vestibular training, Ambulation/gait training, Cognitive remediation/compensation, Community reintegration, Discharge planning, Disease management/prevention, DME/adaptive equipment instruction, Functional mobility training, Neuromuscular re-education, Patient/family education, Pain management, Splinting/orthotics, Stair training, Psychosocial support, Therapeutic Activities, Therapeutic Exercise, UE/LE Coordination activities, UE/LE Strength taining/ROM, Visual/perceptual remediation/compensation, Wheelchair propulsion/positioning, Skin care/wound management  OT Interventions Balance/vestibular training, Cognitive remediation/compensation, Discharge planning, Disease mangement/prevention, DME/adaptive equipment instruction, Functional mobility training, Neuromuscular re-education, Pain management, Patient/family education, Psychosocial support, Self Care/advanced ADL retraining, Splinting/orthotics, Therapeutic Activities, Therapeutic Exercise, UE/LE Strength taining/ROM, UE/LE Coordination activities, Visual/perceptual remediation/compensation  SLP Interventions Cognitive remediation/compensation, Cueing hierarchy, Dysphagia/aspiration precaution training, Internal/external aids, Functional tasks, Environmental controls, Multimodal communication approach, Therapeutic Exercise, Patient/family education, Therapeutic Activities, Speech/Language facilitation  TR Interventions    SW/CM Interventions  Discharge Planning, Psychosocial Support, Patient/Family Education    Team Discharge Planning: Destination: PT-Home ,OT- Home , SLP-Skilled Nursing Facility (SNF) Projected Follow-up: PT-Home health PT, 24 hour supervision/assistance, OT-  Home health OT, 24 hour supervision/assistance, SLP-Skilled Nursing facility Projected Equipment Needs: PT-To be determined, OT- 3 in 1 bedside comode, Tub/shower bench, SLP-None recommended by SLP Equipment Details: PT- , OT-  Patient/family involved in discharge  planning: PT- Patient unable/family or caregiver not available (Due to decreased arousal, unable to discuss with pt),  OT-Patient, SLP-Patient, Family member/caregiver  MD ELOS: 25-28 days Medical Rehab Prognosis:  Excellent Assessment: The patient has been admitted for CIR therapies with the diagnosis of right ACA infarct. The team will be addressing functional mobility, strength, stamina, balance, safety, adaptive techniques and equipment, self-care, bowel and bladder mgt, patient and caregiver education, NMR, visual perceptual awareness, communication, cognition, swallowing, ego support. Goals have been set at min to mod assist with mobility, self-care, cognition, and communication.    Ranelle OysterZachary T. Swartz, MD, FAAPMR      See Team Conference Notes for weekly updates to the plan of care

## 2014-11-09 ENCOUNTER — Inpatient Hospital Stay (HOSPITAL_COMMUNITY): Payer: Medicaid Other | Admitting: Occupational Therapy

## 2014-11-09 ENCOUNTER — Inpatient Hospital Stay (HOSPITAL_COMMUNITY): Payer: Medicaid Other | Admitting: Speech Pathology

## 2014-11-09 ENCOUNTER — Inpatient Hospital Stay (HOSPITAL_COMMUNITY): Payer: Medicaid Other | Admitting: Physical Therapy

## 2014-11-09 DIAGNOSIS — R339 Retention of urine, unspecified: Secondary | ICD-10-CM

## 2014-11-09 LAB — GLUCOSE, CAPILLARY: GLUCOSE-CAPILLARY: 82 mg/dL (ref 70–99)

## 2014-11-09 NOTE — Progress Notes (Signed)
Physical Therapy Session Note  Patient Details  Name: [name] Short MRN: [id number] Date of Birth: 08/09/1965  Today's Date: 11/09/2014 PT Individual Time: 1100-1200 PT Individual Time Calculation (min): 60 min   Short Term Goals: Week 1:  PT Short Term Goal 1 (Week 1): Pt will perform rolling R with bed rail max Short and 100% cues. PT Short Term Goal 2 (Week 1): Pt will perform rolling L with bed rail mod Short and 75% cues.  PT Short Term Goal 3 (Week 1): Pt will perform supine to sit total Short of single therapist with 100% cues and HOB flat using rail. PT Short Term Goal 4 (Week 1): Pt will perform sit to supine total Short of 1 with 100% and HOB flat. PT Short Term Goal 5 (Week 1): Pt will perform dynamic sitting balance with distractions for 2 minutes with bilat LE support, RUE support requiring max Short.  Skilled Therapeutic Interventions/Progress Updates:    Pt received seated in w/c accompanied by fiance having just completed OT. Session focused on attention to L visual field, midline orientation in seated, and initiation of gait training. Transported pt to ortho gym (to decrease external distractions) in w/c with total Short for time management. Pt performed squat pivot transfer from w/c > mat table with +2A using hand-over-back technique with multimodal cueing for full anterior weight shift. Seated EOM with bilat UE/LE support, pt required close supervision to mod Short for postural stability with static sitting. See below for detailed description of NMR interventions.  Donned ACE bandage at L ankle for dorsiflexion assist. Pt performed sit <> stand with +2A (3 musketeers assist) with tactile cueing at L knee for increased proprioceptive input, manual facilitation of anterior weight shift. Pt performed gait x6 steps total with +2A (3 musketeers) with close w/c follow, manual facilitation of anterolateral weight shift and manual stabilization of L knee during LLE stance. Pt able to initiate LLE advancement  (however, difficult to discern if L hip flexion vs. compensatory trunk extension) but required manual repositioning of LLE step placement. Gait trial ended secondary to pt fatigue. After rest break, pt performed w/c mobility x35' in controlled environment with max Short using RLE only (unable to use RUE due to tilt-in-space w/c). Session ended in pt room, where pt was left reclined in tilt-in-space w/c with quick release belt in place for safety, fiance present, and all needs within reach.  Therapy Documentation Precautions:  Precautions Precautions: Fall Precaution Comments: Rt gaze preference, decreased attention to Lt Restrictions Weight Bearing Restrictions: No Vital Signs: Therapy Vitals Pulse Rate: 67 BP: (!) 158/90 mmHg Pain: Pain Assessment Pain Assessment: No/denies pain Pain Score: 0-No pain Locomotion : Ambulation Ambulation/Gait Assistance: 1: +2 Total assist;Other (comment) (3 musketeers)  NMR: Seated EOM with bilat LE support at LUE support, wedge placed under R buttocks to increase weightbearing on L, pt performed RUE reaching (anterolateral and across midline) for functional weight shifting, increased postural control. Cueing focused on visual attention to mirror (placed anterior to pt for visual feedback) for postural awareness. Pt required close supervision to min guard for majority of seated balance trial, Total Short to recover from significant LOB to L side with RUE reaching across midline.  See FIM for current functional status  Therapy/Group: Individual Therapy  [place]ble, [name] Short 11/09/2014, 7:32 PM

## 2014-11-09 NOTE — Plan of Care (Signed)
Problem: RH BOWEL ELIMINATION Goal: RH STG MANAGE BOWEL WITH ASSISTANCE STG Manage Bowel with Mod Assistance.  Outcome: Not Progressing Patient was incontinent of stool in his brief   Problem: RH BLADDER ELIMINATION Goal: RH STG MANAGE BLADDER WITH ASSISTANCE STG Manage Bladder With mod Assistance  Outcome: Not Progressing Foley in place for retention Goal: RH STG MANAGE BLADDER WITH EQUIPMENT WITH ASSISTANCE STG Manage Bladder With Equipment With mod Assistance  Foley in place for retention

## 2014-11-09 NOTE — Progress Notes (Signed)
Subjective/Complaints:  49 y.o.right handed  male with history of hypertension as well as remote tobacco abuse. Patient on no scheduled medications upon admission. Patient independent prior to admission living with his girlfriend working at [employer]. Presented 10/31/2014 with left-sided weakness and altered mental status. Noted blood pressure 220/110. Urine drug screen positive for marijuana. MRI of the brain showed acute ischemic right ACA territory infarct. 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 Irregularity of the A2 segment of the anterior cerebral bilaterally with abrupt cut off of flow at the A2 segment of the right ACA 1.5 cm above its origin consistent with patient's acute right ACA distribution infarct. Carotid Dopplers were no ICA stenosis. Echocardiogram with ejection fraction 35-40% with diffuse hypokinesis. Venous Doppler studies lower extremities negative for DVT. Neurology consulted patient did  receive TPA. Presently maintained on aspirin/Plavix   Pt asking about foley removal   Objective: Vital Signs: Blood pressure 157/89, pulse 74, temperature 98.9 F (37.2 C), temperature source Oral, resp. rate 19, weight 90.8 kg (200 lb 2.8 oz), SpO2 97 %. No results found. No results found for this or any previous visit (from the past 72 hour(s)).   HEENT: normal and extensive gold, halitosis Cardio: RRR and no murmurs Resp: CTA B/L and unlabored GI: BS positive and nontender nondistended Extremity:  No Edema Skin:   Intact Neuro: Lethargic, Flat, Abnormal Sensory reduced sensation in the left lower extremity greater than left upper extremity to pinch, difficult to assess secondary to affect, Abnormal Motor 5/5 in the right deltoid, biceps, triceps, grip, hip flexor, knee extensor, ankle dorsiflexor and plantar flexor and Inattention Musc/Skel:  Other no pain  with range of motion of the left upper or left lower limb. Gen. no acute distress   Assessment/Plan: 1. Functional deficits secondary to right ACA infarct with left hemiparesis as well as cognitive deficits which require 3+ hours per day of interdisciplinary therapy in a comprehensive inpatient rehab setting. Physiatrist is providing close team supervision and 24 hour management of active medical problems listed below. Physiatrist and rehab team continue to assess barriers to discharge/monitor patient progress toward functional and medical goals. FIM: FIM - Bathing Bathing Steps Patient Completed: Chest, Left upper leg, Right lower leg (including foot), Left Arm, Abdomen Bathing: 1: Two helpers  FIM - Upper Body Dressing/Undressing Upper body dressing/undressing steps patient completed: Put head through opening of pull over shirt/dress Upper body dressing/undressing: 2: Max-Patient completed 25-49% of tasks FIM - Lower Body Dressing/Undressing Lower body dressing/undressing: 1: Two helpers  FIM - Toileting Toileting steps completed by patient: Performs perineal hygiene Toileting: 2: Max-Patient completed 1 of 3 steps  FIM - Diplomatic Services operational officerToilet Transfers Toilet Transfers Assistive Devices: PsychiatristBedside commode Toilet Transfers: 1-Two helpers  FIM - BankerBed/Chair Transfer Bed/Chair Transfer Assistive Devices: Bed rails, HOB elevated Bed/Chair Transfer: 1: Two helpers  FIM - Locomotion: Wheelchair Locomotion: Wheelchair: 0: Activity did not occur FIM - Locomotion: Ambulation Locomotion: Ambulation: 0: Activity did not occur  Comprehension Comprehension Mode: Auditory Comprehension: 2-Understands basic 25 - 49% of the time/requires cueing 51 - 75% of the time  Expression Expression Mode: Verbal Expression: 3-Expresses basic 50 - 74% of the time/requires cueing 25 - 50% of the time. Needs to repeat parts of sentences.  Social Interaction Social Interaction: 2-Interacts appropriately 25 - 49% of  time - Needs frequent redirection.  Problem Solving Problem Solving: 3-Solves basic  50 - 74% of the time/requires cueing 25 - 49% of the time  Memory Memory: 2-Recognizes or recalls 25 - 49% of the time/requires cueing 51 - 75% of the time  Medical Problem List and Plan: 1. Functional deficits secondary to right ACA infarct embolic secondary to unknown source 2.  DVT Prophylaxis/Anticoagulation: Subcutaneous Lovenox. Monitor platelet counts of any signs of bleeding. Venous Doppler studies negative 3. Pain Management: Tylenol as needed 4. Dysphagia/left side inattention. Mechanical soft diet. Follow-up speech therapy 5. Neuropsych: This patient is capable of making decisions on his own behalf. 6. Skin/Wound Care: nutrition/skin checks 7. Fluids/Electrolytes/Nutrition: encourage po. Checking labs 8. Hypertension with poor medical compliance. Norvasc 10 mg daily, Coreg 6.25 mg twice a day, lisinopril 40 mg daily.   - 9. Urine drug screen positive for marijuana. Counseling 10. Hyperlipidemia. Lipitor 11. Urinary retention will d/c foley  LOS (Days) 4 A FACE TO FACE EVALUATION WAS PERFORMED  KIRSTEINS,ANDREW E 11/09/2014, 9:11 AM

## 2014-11-09 NOTE — Progress Notes (Signed)
Occupational Therapy Session Note  Patient Details  Name: [name] Short MRN: [id number] Date of Birth: 08/09/1965  Today's Date: 11/09/2014 OT Individual Time: 0930-1100 OT Individual Time Calculation (min): 90 min    Short Term Goals: Week 1:  OT Short Term Goal 1 (Week 1): Pt will complete bathing with max assist of 1 caregiver at bed level to EOB OT Short Term Goal 2 (Week 1): Pt will complete UB dressing with mod assist OT Short Term Goal 3 (Week 1): Pt will maintain static sititng balance at EOB for 1 min in preparation for self-care task OT Short Term Goal 4 (Week 1): Pt will complete toilet transfer to drop arm BSC with max assist   Skilled Therapeutic Interventions/Progress Updates:    Engaged in ADL retraining with focus on bed mobility, sitting balance, standing balance, attention to task, task initiation, attention to Lt, and transfers.  Pt's wife present and provided assist and encouragement during session.  Bathing and dressing completed seated at EOB with focus on scanning to Lt as therapist positioned on Lt.  Required min-total assist to maintain sitting balance with pt with 1 instance of losing balance backwards and unable to recognize or correct without assist.  Lean to Rt in sitting to attempt to maintain upright sitting balance with head rotated and tilted to Rt.  Recalled hemi-dressing technique with verbalizing need to thread weak extremity first.  Sit > stand with +2 to complete LB dressing with therapist providing tactile cues and blocking at Lt knee to promote upright standing.  Squat pivot to Rt bed > w/c with +2 for safety and therapist providing manual facilitation (over the back) to elicit forward weight shifting as needed for squat pivot transfer.    Engaged in sit > stand with +2 with blocking at Rt knee, progressed to sit > stand practice in therapy gym with mirror to provide visual feedback on midline orientation and head alignment with pt able to identify in mirror  but unable to correct independently.  Charmaine DownsUtilized Eva walker to provide UE support in standing to facilitate upright standing with pt able to maintain standing with min/steady assist at trunk and blocking at Lt knee for upright standing.  +2 for all sit > stand and squat pivot transfers for safety.  Therapy Documentation Precautions:  Precautions Precautions: Fall Precaution Comments: Rt gaze preference, decreased attention to Lt Restrictions Weight Bearing Restrictions: No General:   Vital Signs: Therapy Vitals Pulse Rate: 74 BP: (!) 157/89 mmHg Pain: Pain Assessment Pain Assessment: 0-10 Pain Score: 0-No pain  See FIM for current functional status  Therapy/Group: Individual Therapy  Rosalio LoudHOXIE, Lizabeth Fellner 11/09/2014, 12:20 PM

## 2014-11-09 NOTE — Progress Notes (Signed)
Speech Language Pathology Daily Session Note  Patient Details  Name: [name] Short MRN: [id number] Date of Birth: 08/09/1965  Today's Date: 11/09/2014 SLP Individual Time: 1400-1500 SLP Individual Time Calculation (min): 60 min  Short Term Goals: Week 1: SLP Short Term Goal 1 (Week 1): Pt will tolerate dys 3 diet with thin liquids using comp strategies with min A. SLP Short Term Goal 1 - Progress (Week 1): Progressing toward goal SLP Short Term Goal 2 (Week 1): Pt will be oriented to person, place, time, situation with external supports with mod A. SLP Short Term Goal 2 - Progress (Week 1): Progressing toward goal SLP Short Term Goal 3 (Week 1): Pt will follow basic 2 step commands in fuctional tasks with mod A. SLP Short Term Goal 3 - Progress (Week 1): Progressing toward goal SLP Short Term Goal 4 (Week 1): Pt will sustain attention to a functional task for 5-10 minutes with mod A. SLP Short Term Goal 4 - Progress (Week 1): Progressing toward goal SLP Short Term Goal 5 (Week 1): Pt will attend to the L side in basic functional task with mod A. SLP Short Term Goal 5 - Progress (Week 1): Progressing toward goal SLP Short Term Goal 6 (Week 1): Pt will identify 2 cognitive deficits and 2 physical deficits with mod A. SLP Short Term Goal 6 - Progress (Week 1): Progressing toward goal  Skilled Therapeutic Interventions: Skilled treatment session focused on cognitive goals. Initially, pt was noted to be awake, looking out the window to his right. Pt was slow to respond, and in the first 20 minutes responded to only 4 questions. Pt asked repeatedly what time the session was over. During the session, pt made 3-4 phone calls, trying to get in touch with his sister or daughter to determine if she had found her phone charger. Appropriate exchange was noted between pt and his sister, without apparent difficulty or delay of response.  Following phone call, SLP facilitated session with focus on recall of  ST goals. Pt oriented to person, place, time and situation with independent use of environmental cues.  Pt verbalized awareness of left hemiparesis and left inattention, and reported 2 safe swallow strategies.  SLP turned focus to attention, with discussion of family members. Pt provided conflicting information regarding [name] Short (girlfriend or wife?) and number of grandchildren (2 daughters, 8 grandchildren reported, but maintained 1 daughter 80(26, 45Tiffany) had 4, 1 daughter 72(28, [name] Short) had 3 children). Pt consistent about 47 year old daughter Scientist, research (physical sciences)([name] Short) and sister Warden/ranger([name] Short). Pt recalled specific information about his phone conversation with his sister after 15 minute delay (sister reported making grilled chicken and sweet potatoes and salad for pt).  Pt reported his main goal was to "get up out of here".   FIM:  Comprehension Comprehension Mode: Auditory Comprehension: 5-Follows basic conversation/direction: With extra time/assistive device Expression Expression Mode: Verbal Expression: 4-Expresses basic 75 - 89% of the time/requires cueing 10 - 24% of the time. Needs helper to occlude trach/needs to repeat words. Social Interaction Social Interaction: 2-Interacts appropriately 25 - 49% of time - Needs frequent redirection. Problem Solving Problem Solving: 3-Solves basic 50 - 74% of the time/requires cueing 25 - 49% of the time Memory Memory: 2-Recognizes or recalls 25 - 49% of the time/requires cueing 51 - 75% of the time  Pain Pain Assessment Pain Assessment: No/denies pain  Therapy/Group: Individual Therapy   Obie Kallenbach B. DoverBueche, [facility] Centers Dba Saxony Surgery CenterMSP, CCC-SLP [phone]  Leigh AuroraBueche, Nimesh Riolo Brown 11/09/2014, 4:15 PM

## 2014-11-10 ENCOUNTER — Inpatient Hospital Stay (HOSPITAL_COMMUNITY): Payer: Self-pay | Admitting: Speech Pathology

## 2014-11-10 ENCOUNTER — Inpatient Hospital Stay (HOSPITAL_COMMUNITY): Payer: Medicaid Other | Admitting: Occupational Therapy

## 2014-11-10 ENCOUNTER — Inpatient Hospital Stay (HOSPITAL_COMMUNITY): Payer: Self-pay | Admitting: *Deleted

## 2014-11-10 LAB — BASIC METABOLIC PANEL
Anion gap: 9 (ref 5–15)
BUN: 20 mg/dL (ref 6–23)
CHLORIDE: 101 mmol/L (ref 96–112)
CO2: 25 mmol/L (ref 19–32)
CREATININE: 1.36 mg/dL — AB (ref 0.50–1.35)
Calcium: 9.3 mg/dL (ref 8.4–10.5)
GFR calc Af Amer: 70 mL/min — ABNORMAL LOW (ref >90)
GFR calc non Af Amer: 61 mL/min — ABNORMAL LOW (ref >90)
Glucose, Bld: 98 mg/dL (ref 70–99)
POTASSIUM: 4 mmol/L (ref 3.5–5.1)
Sodium: 135 mmol/L (ref 135–145)

## 2014-11-10 LAB — ALDOSTERONE + RENIN ACTIVITY W/ RATIO
ALDO / PRA Ratio: >30.7 — ABNORMAL HIGH (ref 0.0–30.0)
ALDOSTERONE: 4.6 ng/dL (ref 0.0–30.0)
PRA LC/MS/MS: <0.15 ng/mL/hr

## 2014-11-10 LAB — CARDIOLIPIN ANTIBODIES, IGG, IGM, IGA: ANTICARDIOLIPIN IGG: 14 GPL U/mL (ref 0–14)

## 2014-11-10 LAB — METANEPHRINES, PLASMA
Metanephrine, Free: 80 pg/mL — ABNORMAL HIGH (ref <=57)
Normetanephrine, Free: 108 pg/mL (ref <=148)
Total Metanephrines-Plasma: 188 pg/mL (ref <=205)

## 2014-11-10 LAB — URINE CULTURE

## 2014-11-10 LAB — CATECHOLAMINES, FRACTIONATED, PLASMA
Norepinephrine: 244 pg/mL
Total Catecholamines (Nor+Epi): 244 pg/mL

## 2014-11-10 LAB — BETA-2-GLYCOPROTEIN I ABS, IGG/M/A
Beta-2 Glyco I IgG: <9 GPI IgG units (ref 0–20)
Beta-2-Glycoprotein I IgA: 18 GPI IgA units (ref 0–25)

## 2014-11-10 LAB — LUPUS ANTICOAGULANT PANEL
DRVVT: 30.7 s (ref 0.0–55.1)
PTT Lupus Anticoagulant: 39.4 s (ref 0.0–50.0)

## 2014-11-10 LAB — SICKLE CELL SCREEN: SICKLE CELL SCREEN: NEGATIVE

## 2014-11-10 LAB — HEMOGLOBIN A1C
Hgb A1c MFr Bld: 5.8 % — ABNORMAL HIGH (ref 4.8–5.6)
Mean Plasma Glucose: 120 mg/dL

## 2014-11-10 LAB — HOMOCYSTEINE

## 2014-11-10 MED ORDER — HYDROCHLOROTHIAZIDE 25 MG PO TABS
25.0000 mg | ORAL_TABLET | Freq: Every day | ORAL | Status: DC
  Administered 2014-11-11 – 2014-11-24 (×14): 25 mg via ORAL
  Filled 2014-11-10 (×15): qty 1

## 2014-11-10 NOTE — Progress Notes (Signed)
Physical Therapy Session Note  Patient Details  Name: [name] Short Sheppard MRN: [id number] Date of Birth: 1966/01/17  Today's Date: 11/10/2014 PT Individual Time: 1300-1345 PT Individual Time Calculation (min): 45 min   Short Term Goals: Week 1:  PT Short Term Goal 1 (Week 1): Pt will perform rolling R with bed rail max A and 100% cues. PT Short Term Goal 2 (Week 1): Pt will perform rolling L with bed rail mod A and 75% cues.  PT Short Term Goal 3 (Week 1): Pt will perform supine to sit total A of single therapist with 100% cues and HOB flat using rail. PT Short Term Goal 4 (Week 1): Pt will perform sit to supine total A of 1 with 100% and HOB flat. PT Short Term Goal 5 (Week 1): Pt will perform dynamic sitting balance with distractions for 2 minutes with bilat LE support, RUE support requiring max A.  Skilled Therapeutic Interventions/Progress Updates:  Tx focused on functional mobility training, cognitive remediation, standing frame, and NMR via forced use, manual facilitation, and multi-modal cues for L-sided attention and motor control.   [facility], propelled WC x100' with up to [facility]Max steering assist and max cues for continuing activity.  Pt engaged in standing frame 2x588min with manual facilitation for trunk/head alignment during bimanual UE tasks and attention to L-sided with reaching/placing tasks, object recognition. Pt needed max cues to continue task and repeat sequences. Pt needed seated rest breaks due to fatigue.   Pt fatigued, requesting to bgo back to bed. +2 for squat-pivot transfer to L and min-guard for dynamic sitting balance EOB. Mod A sit>supine for bil LE lifting. Pt left in L sidelying for sustained L-sided proprioception and pressure relief.       Therapy Documentation Precautions:  Precautions Precautions: Fall Precaution Comments: Rt gaze preference, decreased attention to Lt Restrictions Weight Bearing Restrictions: No   Pain: 7/10 sacral pain, modified  tx     See FIM for current functional status  Therapy/Group: Individual Therapy  Clydene Lamingole Reshanda Lewey, PT, DPT 11/10/2014, 1:24 PM

## 2014-11-10 NOTE — Progress Notes (Signed)
Occupational Therapy Session Note  Patient Details  Name: Hassel NethCarl Sheppard MRN: [id number] Date of Birth: 08/09/1965  Today's Date: 11/10/2014 OT Individual Time: 1100-1200 OT Individual Time Calculation (min): 60 min    Short Term Goals: Week 1:  OT Short Term Goal 1 (Week 1): Pt will complete bathing with max assist of 1 caregiver at bed level to EOB OT Short Term Goal 2 (Week 1): Pt will complete UB dressing with mod assist OT Short Term Goal 3 (Week 1): Pt will maintain static sititng balance at EOB for 1 min in preparation for self-care task OT Short Term Goal 4 (Week 1): Pt will complete toilet transfer to drop arm BSC with max assist   Skilled Therapeutic Interventions/Progress Updates:    Engaged in ADL retraining with focus on attention to task, initiation, attention to Lt, transfers, sitting balance, and standing balance.  Pt in tilt-in-space w/c upon arrival, therefore completed bathing at sit > stand level at sink.  Pt required increased time for processing and initiation with bathing tasks, requiring increased wait time and additional cues.  LB bathing and dressing completed at sit > stand level with +2 for safety and management of trunk control and LLE in standing.  With Lt knee blocked pt able to maintain standing with UE support with mod assist and +2 for safety.  Engaged in reaching task in sitting with Lt hip wedged, as pt tends to sit in posterior pelvic tilt and with Rt trunk flexed.  Utilized Ship brokermirror for visual feedback and wedge with noted improvements in increased midline posture.  Modified w/c for trial period with towel wedge between w/c seat and cushion to promote midline posture.  Pt with increased initiation with sit > stand this session when prompted to count.  Pt's wife present and providing assistance and encouragement throughout session.  Therapy Documentation Precautions:  Precautions Precautions: Fall Precaution Comments: Rt gaze preference, decreased attention to  Lt Restrictions Weight Bearing Restrictions: No General:   Vital Signs: Therapy Vitals Pulse Rate: 73 BP: (!) 167/99 mmHg Pain:  Pt with no c/o pain  See FIM for current functional status  Therapy/Group: Individual Therapy  Rosalio LoudHOXIE, Muhannad Bignell 11/10/2014, 12:14 PM

## 2014-11-10 NOTE — Progress Notes (Signed)
Speech Language Pathology Daily Session Note  Patient Details  Name: [name] Short MRN: [id number] Date of Birth: 19-Aug-1965  Today's Date: 11/10/2014 SLP Individual Time: 1003-1100 SLP Individual Time Calculation (min): 57 min  Short Term Goals: Week 1: SLP Short Term Goal 1 (Week 1): Pt will tolerate dys 3 diet with thin liquids using comp strategies with min A. SLP Short Term Goal 1 - Progress (Week 1): Progressing toward goal SLP Short Term Goal 2 (Week 1): Pt will be oriented to person, place, time, situation with external supports with mod A. SLP Short Term Goal 2 - Progress (Week 1): Progressing toward goal SLP Short Term Goal 3 (Week 1): Pt will follow basic 2 step commands in fuctional tasks with mod A. SLP Short Term Goal 3 - Progress (Week 1): Progressing toward goal SLP Short Term Goal 4 (Week 1): Pt will sustain attention to a functional task for 5-10 minutes with mod A. SLP Short Term Goal 4 - Progress (Week 1): Progressing toward goal SLP Short Term Goal 5 (Week 1): Pt will attend to the Short side in basic functional task with mod A. SLP Short Term Goal 5 - Progress (Week 1): Progressing toward goal SLP Short Term Goal 6 (Week 1): Pt will identify 2 cognitive deficits and 2 physical deficits with mod A. SLP Short Term Goal 6 - Progress (Week 1): Progressing toward goal  Skilled Therapeutic Interventions:  Pt was seen for skilled ST targeting cognitive goals.  Upon arrival, pt was in bathroom with RN and nurse tech.  Pt was noted with x1 instance of unsafe impulsivity when reaching towards toilet paper and SLP provided max verbal cues for redirection/sustained attention to 1 task at a time.  Once pt was back in wheelchair, SLP facilitated the session with basic self care tasks targeting left attention, sequencing and initiation.  Pt located items needed to complete oral care that were on the left with min assist verbal and visual cues.  Pt then completed oral care with min verbal  cues for initiation and sequencing.  Throughout self care tasks, pt benefited from min verbal, visual, and tactile cues to monitor and correct head posture.  SLP also facilitated the session with a basic, functional activity targeting sustained attention, problem solving, and visual scanning to the left of midline.  Pt planned and executed a problem solving strategy during the abovementioned task with initial max verbal and visual cues for redirection to task; however, cuing was faded to min assist for sustained attention, problem solving, and initiation with increased task familiarity and repetition.  Pt left upright in wheelchair with quick release belt donned.  Significant other in room, all needs left within reach.  Continue per current plan of care.    FIM:  Comprehension Comprehension Mode: Auditory Comprehension: 5-Follows basic conversation/direction: With extra time/assistive device Expression Expression Mode: Verbal Expression: 5-Expresses basic 90% of the time/requires cueing < 10% of the time. Social Interaction Social Interaction: 3-Interacts appropriately 50 - 74% of the time - May be physically or verbally inappropriate. Problem Solving Problem Solving: 3-Solves basic 50 - 74% of the time/requires cueing 25 - 49% of the time Memory Memory: 2-Recognizes or recalls 25 - 49% of the time/requires cueing 51 - 75% of the time  Pain Pain Assessment Pain Assessment: No/denies pain  Therapy/Group: Individual Therapy  [name] Short, [name] Short 11/10/2014, 3:42 PM

## 2014-11-10 NOTE — Progress Notes (Addendum)
Subjective/Complaints:   Patient without complaints today. He is drowsy but is able to awaken to voice. His responses remain very delayed  reviewed blood pressures, remain elevated  Review of systems unable to obtain secondary to cognitive status Objective: Vital Signs: Blood pressure 167/99, pulse 73, temperature 98.5 F (36.9 C), temperature source Oral, resp. rate 17, weight 91.4 kg (201 lb 8 oz), SpO2 98 %. No results found. Results for orders placed or performed during the hospital encounter of 11/05/14 (from the past 72 hour(s))  Glucose, capillary     Status: None   Collection Time: 11/09/14  4:54 PM  Result Value Ref Range   Glucose-Capillary 82 70 - 99 mg/dL     HEENT: normal and extensive gold, halitosis Cardio: RRR and no murmurs Resp: CTA B/L and unlabored GI: BS positive and nontender nondistended Extremity:  No Edema Skin:   Intact Neuro: Lethargic, Flat, Abnormal Sensory reduced sensation in the left lower extremity greater than left upper extremity to pinch, difficult to assess secondary to affect, Abnormal Motor 5/5 in the right deltoid, biceps, triceps, grip, hip flexor, knee extensor, ankle dorsiflexor and plantar flexor and Inattention 0/5 in the left deltoid biceps triceps, trace finger flexors, 0/5 and left hip flexor knee extensor and ankle dorsiflexor  Musc/Skel:  Other no pain with range of motion of the left upper or left lower limb. Gen. no acute distress   Assessment/Plan: 1. Functional deficits secondary to right ACA infarct with left hemiparesis as well as cognitive deficits which require 3+ hours per day of interdisciplinary therapy in a comprehensive inpatient rehab setting. Physiatrist is providing close team supervision and 24 hour management of active medical problems listed below. Physiatrist and rehab team continue to assess barriers to discharge/monitor patient progress toward functional and medical goals. FIM: FIM - Bathing Bathing  Steps Patient Completed: Chest, Left Arm, Abdomen, Right upper leg, Left upper leg, Right lower leg (including foot) Bathing: 1: Two helpers  FIM - Upper Body Dressing/Undressing Upper body dressing/undressing steps patient completed: Thread/unthread right sleeve of pullover shirt/dresss, Put head through opening of pull over shirt/dress Upper body dressing/undressing: 2: Max-Patient completed 25-49% of tasks FIM - Lower Body Dressing/Undressing Lower body dressing/undressing: 1: Two helpers  FIM - Toileting Toileting steps completed by patient: Performs perineal hygiene Toileting: 2: Max-Patient completed 1 of 3 steps  FIM - Diplomatic Services operational officer Devices: Psychiatrist Transfers: 1-Two helpers  FIM - Banker Devices: Bed rails, HOB elevated, Arm rests Bed/Chair Transfer: 1: Two helpers  FIM - Locomotion: Wheelchair Distance: 30 Locomotion: Wheelchair: 1: Travels less than 50 ft with maximal assistance (Pt: 25 - 49%) FIM - Locomotion: Ambulation Locomotion: Ambulation Assistive Devices: Other (comment) (ACE bandage for L ankle dorsiflexion) Ambulation/Gait Assistance: 1: +2 Total assist, Other (comment) (3 musketeers) Locomotion: Ambulation: 1: Two helpers  Comprehension Comprehension Mode: Auditory Comprehension: 5-Follows basic conversation/direction: With extra time/assistive device  Expression Expression Mode: Verbal Expression: 4-Expresses basic 75 - 89% of the time/requires cueing 10 - 24% of the time. Needs helper to occlude trach/needs to repeat words.  Social Interaction Social Interaction: 2-Interacts appropriately 25 - 49% of time - Needs frequent redirection.  Problem Solving Problem Solving: 3-Solves basic 50 - 74% of the time/requires cueing 25 - 49% of the time  Memory Memory: 2-Recognizes or recalls 25 - 49% of the time/requires cueing 51 - 75% of the time  Medical Problem List and  Plan: 1. Functional deficits secondary to right  ACA infarct embolic secondary to unknown source 2.  DVT Prophylaxis/Anticoagulation: Subcutaneous Lovenox. Monitor platelet counts of any signs of bleeding. Venous Doppler studies negative 3. Pain Management: Tylenol as needed 4. Dysphagia/left side inattention. Mechanical soft diet. Follow-up speech therapy 5. Neuropsych: This patient is capable of making decisions on his own behalf. 6. Skin/Wound Care: nutrition/skin checks 7. Fluids/Electrolytes/Nutrition: encourage po. Checking labs 8. Hypertension with poor medical compliance. Norvasc 10 mg daily, Coreg 6.25 mg twice a day, lisinopril 40 mg daily.   Increase hydrochlorothiazide 25 mg per day, monitor potassium 9. Urine drug screen positive for marijuana. Counseling 10. Hyperlipidemia. Lipitor 11. Urinary retention , Voiding trial  LOS (Days) 5 A FACE TO FACE EVALUATION WAS PERFORMED  Rajah Lamba E 11/10/2014, 11:18 AM

## 2014-11-10 NOTE — Progress Notes (Signed)
Occupational Therapy Session Note  Patient Details  Name: [name] Short MRN: [id number] Date of Birth: [birth year]  Today's Date: 11/10/2014 OT Individual Time: 1430-1500 OT Individual Time Calculation (min): 30 min    Short Term Goals: Week 1:  OT Short Term Goal 1 (Week 1): Pt will complete bathing with max assist of 1 caregiver at bed level to EOB OT Short Term Goal 2 (Week 1): Pt will complete UB dressing with mod assist OT Short Term Goal 3 (Week 1): Pt will maintain static sititng balance at EOB for 1 min in preparation for self-care task OT Short Term Goal 4 (Week 1): Pt will complete toilet transfer to drop arm BSC with max assist   Skilled Therapeutic Interventions/Progress Updates:    Engaged in therapeutic activity with focus on sitting balance and attention to Lt environment.  Pt in bed upon arrival, willing to complete in task seated at EOB.  Max assist supine to sit with pt demonstrating initial lean to Rt, able to correct with manual facilitation and cues for upright posture.  Pt maintained static sitting balance throughout session with close supervision, although tended to maintain a posterior pelvic tilt.  Pt required increased time for turn taking with table top card activity but would complete turn in <15 seconds with mod faded to min cues.  Items placed just to Lt of visual field to promote scanning to Lt and Lt head turn.  Pt returned to supine in bed and left with all needs in reach.  Therapy Documentation Precautions:  Precautions Precautions: Fall Precaution Comments: Rt gaze preference, decreased attention to Lt Restrictions Weight Bearing Restrictions: No Pain:  Pt with no c/o pain  See FIM for current functional status  Therapy/Group: Individual Therapy  Rosalio LoudHOXIE, Jax Abdelrahman 11/10/2014, 3:17 PM

## 2014-11-11 ENCOUNTER — Inpatient Hospital Stay (HOSPITAL_COMMUNITY): Payer: Self-pay | Admitting: Occupational Therapy

## 2014-11-11 ENCOUNTER — Inpatient Hospital Stay (HOSPITAL_COMMUNITY): Payer: Medicaid Other | Admitting: Speech Pathology

## 2014-11-11 ENCOUNTER — Inpatient Hospital Stay (HOSPITAL_COMMUNITY): Payer: Medicaid Other | Admitting: Occupational Therapy

## 2014-11-11 ENCOUNTER — Inpatient Hospital Stay (HOSPITAL_COMMUNITY): Payer: Self-pay

## 2014-11-11 MED ORDER — METHYLPHENIDATE HCL 5 MG PO TABS
5.0000 mg | ORAL_TABLET | Freq: Two times a day (BID) | ORAL | Status: DC
  Administered 2014-11-11 – 2014-11-13 (×4): 5 mg via ORAL
  Filled 2014-11-11 (×4): qty 1

## 2014-11-11 NOTE — Progress Notes (Signed)
Physical Therapy Session Note  Patient Details  Name: [name] Short Sheppard MRN: [id number] Date of Birth: 1966-04-20  Today's Date: 11/11/2014 PT Individual Time: 1100-1200 PT Individual Time Calculation (min): 60 min   Short Term Goals: Week 1:  PT Short Term Goal 1 (Week 1): Pt will perform rolling R with bed rail max A and 100% cues. PT Short Term Goal 2 (Week 1): Pt will perform rolling L with bed rail mod A and 75% cues.  PT Short Term Goal 3 (Week 1): Pt will perform supine to sit total A of single therapist with 100% cues and HOB flat using rail. PT Short Term Goal 4 (Week 1): Pt will perform sit to supine total A of 1 with 100% and HOB flat. PT Short Term Goal 5 (Week 1): Pt will perform dynamic sitting balance with distractions for 2 minutes with bilat LE support, RUE support requiring max A.  Skilled Therapeutic Interventions/Progress Updates:    Pt received seated in w/c, agreeable to participate in therapy. Session focused on midline orientation, attention to L, gait. Pt stood from w/c w/ STEDI lift and ModA, able to stand from STEDI high perch w/ MinA. Pt required variable assist to maintain standing balance Min-MaxA, noted that visual cueing from mirror did not reduce assist. Instructed pt in reaching activity with LUE to anterior and lateral min excursion outside BOS for forced NMR of LUE and attention to L. Instructed pt in ambulation w/ hallway rail on R, therapist under LUE w/ Max-TotalA to advance L limb and block L knee during stance and +2 assist for w/c follow. Pt tolerated session well. Session ended with pt's girlfriend wheeling him back to room.    Therapy Documentation Precautions:  Precautions Precautions: Fall Precaution Comments: Rt gaze preference, decreased attention to Lt Restrictions Weight Bearing Restrictions: No Pain:  Pt complained of pain in R knee with activity, pt allowed to rest and repositioned leg in chair.  See FIM for current functional  status  Therapy/Group: Individual Therapy  Hosie SpangleGodfrey, Bentley Haralson  Hosie SpangleJess Ebubechukwu Jedlicka, PT, DPT 11/11/2014, 7:41 AM

## 2014-11-11 NOTE — Patient Care Conference (Signed)
Inpatient RehabilitationTeam Conference and Plan of Care Update Date: 11/11/2014   Time: 11;50 AM    Patient Name: [name] Short      Medical Record Number: [id number]  Date of Birth: 01-07-1966 Sex: Male         Room/Bed: 4W11C/4W11C-01 Payor Info: Payor: MEDICAID POTENTIAL / Plan: MEDICAID POTENTIAL / Product Type: *No Product type* /    Admitting Diagnosis: R ACA infarct   Admit Date/Time:  11/05/2014  4:33 PM Admission Comments: No comment available   Primary Diagnosis:  <principal problem not specified> Principal Problem: <principal problem not specified>  Patient Active Problem List   Diagnosis Date Noted  . Secondary cardiomyopathy 11/05/2014  . Malignant hypertension 11/05/2014  . Hyperlipidemia LDL goal <70 11/05/2014  . Cigarette smoker 11/05/2014  . Marijuana abuse 11/05/2014  . Hypokalemia 11/05/2014  . Left hemiparesis 11/05/2014  . Cognitive deficit, post-stroke 11/05/2014  . Left-sided weakness   . Acute right anterior cerebral artery (ACA) ischemic stroke 10/31/2014    Expected Discharge Date: Expected Discharge Date: 12/03/14  Team Members Present: Physician leading conference: Dr. Claudette LawsAndrew Kirsteins Social Worker Present: Dossie DerBecky Emma Schupp, LCSW Nurse Present: Carlean PurlMaryann Barbour, RN PT Present: Wanda Plumparoline Cook, Nita SicklePT;Rodney Wishart, PT OT Present: Primitivo GauzeJulia Saguier, OT;Sarah Hoxie, Heath LarkT;James McGuire, OT SLP Present: Jackalyn LombardNicole Page, SLP PPS Coordinator present : Tora DuckMarie Noel, RN, CRRN     Current Status/Progress Goal Weekly Team Focus  Medical   decreased initiation, severe Left Hemiparesis, dry skin, poor oral hygiene  home d/c Min A  trial ritalin   Bowel/Bladder   Incontinent of bowel and bladder LBM 4/20  continent of bowel and bladder with minimal assistance  Timed toileting q 3-4 hrs and PRN   Swallow/Nutrition/ Hydration   dys 3, thin liquids; full supervision for use of swallowing precautions   supervision with least restrictive diet   trials of advanced consistencies.      ADL's   +2 overall.  Pt participates about 50% with transfers and sit <> stand, however requires +2 for safety due to instability and inconsistency  min assist overall, mod assist toileting  transfers, sitting and standing balance, midline orientation, LUE NMR   Mobility   Pt is total to +2A for bed mobility, max to +2 A for squat pivot transfers, max to mod A for standing tasks, +2 A for gait.  Limited by poor attention, decreased awareness, pusher tendencies  min to mod A overall  L NMR, transfers, decreasing pusher tendencies, attention, gait, midline orientation.    Communication             Safety/Cognition/ Behavioral Observations  max assist for basic cognition   min-mod assist for basic   visual scanning to the left, initiation, sustained attention, basic problem solving for self care tasks   Pain   No complaints of pain noted  Assess for pain and treat  Assess for pain prior to therapy session   Skin   Dry skin LE  Keep skin CDI  Assess and monitor for breakdown/wound      *See Care Plan and progress notes for long and short-term goals.  Barriers to Discharge: see above , heavy assist    Possible Resolutions to Barriers:  see above    Discharge Planning/Teaching Needs:  Home with girlfriend and daughter who will work out 24 hr care-unsure how realistic they are regarding pt's goals here.  SSD and Medicaid applicaitons on Friday      Team Discussion:  Goals-min/mod level-made good improvement this week in  therapies. Trunk getting stronger and mid-line sitting better. Trial of ritalin. Response time better today. Girlfriend here to observe in therapies. Applying for SSD and Medicaid on Friday.  Revisions to Treatment Plan:  None   Continued Need for Acute Rehabilitation Level of Care: The patient requires daily medical management by a physician with specialized training in physical medicine and rehabilitation for the following conditions: Daily direction of a  multidisciplinary physical rehabilitation program to ensure safe treatment while eliciting the highest outcome that is of practical value to the patient.: Yes Daily medical management of patient stability for increased activity during participation in an intensive rehabilitation regime.: Yes Daily analysis of laboratory values and/or radiology reports with any subsequent need for medication adjustment of medical intervention for : Neurological problems  Dock Baccam, Lemar Livings 11/13/2014, 8:40 AM

## 2014-11-11 NOTE — Progress Notes (Signed)
Occupational Therapy Session Note  Patient Details  Name: [name] Short Sheppard MRN: [id number] Date of Birth: [birth year]  Today's Date: 11/11/2014 OT Individual Time: 1330-1400 OT Individual Time Calculation (min): 30 min    Short Term Goals: Week 1:  OT Short Term Goal 1 (Week 1): Pt will complete bathing with max assist of 1 caregiver at bed level to EOB OT Short Term Goal 2 (Week 1): Pt will complete UB dressing with mod assist OT Short Term Goal 3 (Week 1): Pt will maintain static sititng balance at EOB for 1 min in preparation for self-care task OT Short Term Goal 4 (Week 1): Pt will complete toilet transfer to drop arm BSC with max assist   Skilled Therapeutic Interventions/Progress Updates:    1:1 Receive pt in w/c. Transitioned to the gym to focus on sit to stand at high low table. Pt required total cues for visual attention at midline; even with privacy screen. Pt able to achieve standign at midline in the trunk/ core but difficulty achieving midline with eyes and head without visual feedback of a mirror. With return to sitting position; participated in functional reach, grasp and release of rings. Initially pt required max cues to attend to the task however with extra time pt able to complete 3 trails of reaching, grasp and release with max cuing before losing attention to task. Pt with activity noted in shoulder with reaching - however abnormal but movement did occur. Transferred w/c to mat with min A with more than reasonable amt of time to his right with max tactile cues. Sit to sidelying on his right with max A for A for both legs. Focus on opportunities for functional movement to occur in left UE with and without resistance - no movement detected proximally however decr attention to task. Returned to sitting with mod A with max tactile cues for sequencing. +2 max A transfer mat to w/c due to decr attention to task and decr lift to come up for transfer. Left in room with safety belt on.    Therapy Documentation Precautions:  Precautions Precautions: Fall Precaution Comments: Rt gaze preference, decreased attention to Lt Restrictions Weight Bearing Restrictions: No Pain: Pain Assessment Pain Assessment: No/denies pain  See FIM for current functional status  Therapy/Group: Individual Therapy  Roney MansSmith, Anija Brickner [facility] Center[facility]sey 11/11/2014, 3:50 PM

## 2014-11-11 NOTE — Progress Notes (Signed)
Occupational Therapy Session Note  Patient Details  Name: [name] Short MRN: [id number] Date of Birth: 1965/11/28  Today's Date: 11/11/2014 OT Individual Time: 4098-1191 and 1430-1510 OT Individual Time Calculation (min): 60 min and 40 min   Short Term Goals: Week 1:  OT Short Term Goal 1 (Week 1): Pt will complete bathing with max assist of 1 caregiver at bed level to EOB OT Short Term Goal 2 (Week 1): Pt will complete UB dressing with mod assist OT Short Term Goal 3 (Week 1): Pt will maintain static sititng balance at EOB for 1 min in preparation for self-care task OT Short Term Goal 4 (Week 1): Pt will complete toilet transfer to drop arm BSC with max assist   Skilled Therapeutic Interventions/Progress Updates:    1) Engaged in ADL retraining with focus on functional transfers, sit <> stand, and trunk control/midline positioning in sitting and standing.  Upon arrival, pt reports stomach hurting and requesting to toilet.  Pt with improved initiation with bed mobility and transfers this session.  +2 for transfer to Rt bed > w/c with 2nd person only stabilizing w/c for safety with pt demonstrating carryover of sequencing and technique, requiring over the back technique to assist with forward weight shift.  Pt participating >50% with transfers this session with cue to count to 3.  Toilet transfer from w/c to Rt with use of grab bar and mod assist with increased initiation and control.  LB bathing completed while seated on toilet.  +2 for perineal hygiene with therapist providing tactile cues to Lt knee to promote stability in standing while 2nd person completed hygiene.  UB bathing and dressing completed at sink with use of mirror for visual input to increase midline posture.  Pt with difficulty releasing items once grasped with Lt hand.  Carryover of hemi-dressing technique with shirt.  Engaged in sit > stand with min-mod assist and then mod assist in standing to maintain Lt knee control with use of  mirror for visual input.  Pt left in tilt-in-space w/c with quick release belt on and call bell in reach.  2) Engaged in therapeutic activity with focus on initiation, processing time, recall of education, transfers, and NMR.  Pt received in w/c and transported to therapy gym.  Pt with difficulty with focused attention this session due to moderately distracting environment.  Squat pivot transfer to Rt with mod assist with increased initiation and weight shift.  In sitting attempted to engage in sit > stand with therapist seated in arm chair in front of pt to promote forward weight shift and provide tactile cues at hips and trunk to promote midline standing.  Pt unable to sustain attention to task, therefore attempted reaching task in sitting while weight bearing through LUE with pt requiring increased wait time and cues.  Engaged in sit <> stand with use of arm chair with pt able to come to standing with tactile cues only at shoulder girdle then providing mod assist at Lt knee to promote upright standing.  Squat pivot back to w/c (to Lt) with mod-max assist and 2nd person providing stabilization of w/c.  Pt returned to supine in bed with +2 for safety and mod-max assist as well as cues to stop talking to allow focus on mobility.  Therapy Documentation Precautions:  Precautions Precautions: Fall Precaution Comments: Rt gaze preference, decreased attention to Lt Restrictions Weight Bearing Restrictions: No General:   Vital Signs: Therapy Vitals Pulse Rate: 77 BP: 124/86 mmHg Pain:  Pt with no  c/o pain  See FIM for current functional status  Therapy/Group: Individual Therapy  [name] Short 11/11/2014, 9:51 AM

## 2014-11-11 NOTE — Progress Notes (Signed)
Speech Language Pathology Daily Session Note  Patient Details  Name: [name] Short MRN: [id number] Date of Birth: 1965/11/30  Today's Date: 11/11/2014 SLP Individual Time: 1300-1330 SLP Individual Time Calculation (min): 30 min  Short Term Goals: Week 1: SLP Short Term Goal 1 (Week 1): Pt will tolerate dys 3 diet with thin liquids using comp strategies with min A. SLP Short Term Goal 1 - Progress (Week 1): Progressing toward goal SLP Short Term Goal 2 (Week 1): Pt will be oriented to person, place, time, situation with external supports with mod A. SLP Short Term Goal 2 - Progress (Week 1): Progressing toward goal SLP Short Term Goal 3 (Week 1): Pt will follow basic 2 step commands in fuctional tasks with mod A. SLP Short Term Goal 3 - Progress (Week 1): Progressing toward goal SLP Short Term Goal 4 (Week 1): Pt will sustain attention to a functional task for 5-10 minutes with mod A. SLP Short Term Goal 4 - Progress (Week 1): Progressing toward goal SLP Short Term Goal 5 (Week 1): Pt will attend to the L side in basic functional task with mod A. SLP Short Term Goal 5 - Progress (Week 1): Progressing toward goal SLP Short Term Goal 6 (Week 1): Pt will identify 2 cognitive deficits and 2 physical deficits with mod A. SLP Short Term Goal 6 - Progress (Week 1): Progressing toward goal  Skilled Therapeutic Interventions: Skilled ST focused on cognitive goals. Upon arrival of SLP, pt was noted to be seated in wheelchair awake, significant other present. SLP administered the MoCA-Basic. Pt scored 24/30, with difficulty noted on mental flexibility and attention subtests. Pt accurately completed alternating trail/executive function task, and recalled 4/5 words with 5 minute delay. Pt oriented x4 with independent use of environmental cues. Mod cueing provided by girlfriend for maintaining attention to task and eliciting responses to SLP. Following the session, she reported pt having difficulty with his  current status/ level of impairment/need for therapy and assistance.    FIM:  Comprehension Comprehension Mode: Auditory Comprehension: 5-Follows basic conversation/direction: With extra time/assistive device Expression Expression Mode: Verbal Expression: 5-Expresses basic 90% of the time/requires cueing < 10% of the time. Social Interaction Social Interaction: 3-Interacts appropriately 50 - 74% of the time - May be physically or verbally inappropriate. Problem Solving Problem Solving: 3-Solves basic 50 - 74% of the time/requires cueing 25 - 49% of the time Memory Memory: 2-Recognizes or recalls 25 - 49% of the time/requires cueing 51 - 75% of the time FIM - Eating Eating Activity: 5: Supervision/cues  Pain Pain Assessment Pain Assessment: No/denies pain  Therapy/Group: Individual Therapy  Celia B. HavelockBueche, [facility]ists[facility] Luc[facility] County LLCMSP, CCC-SLP [phone]  Leigh AuroraBueche, Celia Brown 11/11/2014, 2:27 PM

## 2014-11-11 NOTE — Progress Notes (Signed)
Subjective/Complaints:  Patient reclined in otto bock chair, response time is quicker. Feels sore in the hip but no other complaints. Review of systems unable to obtain secondary to cognitive status Objective: Vital Signs: Blood pressure 124/86, pulse 77, temperature 98.1 F (36.7 C), temperature source Oral, resp. rate 18, weight 91.4 kg (201 lb 8 oz), SpO2 96 %. No results found. Results for orders placed or performed during the hospital encounter of 11/05/14 (from the past 72 hour(s))  Glucose, capillary     Status: None   Collection Time: 11/09/14  4:54 PM  Result Value Ref Range   Glucose-Capillary 82 70 - 99 mg/dL  Basic metabolic panel     Status: Abnormal   Collection Time: 11/10/14  1:05 PM  Result Value Ref Range   Sodium 135 135 - 145 mmol/L   Potassium 4.0 3.5 - 5.1 mmol/L   Chloride 101 96 - 112 mmol/L   CO2 25 19 - 32 mmol/L   Glucose, Bld 98 70 - 99 mg/dL   BUN 20 6 - 23 mg/dL   Creatinine, Ser 1.36 (H) 0.50 - 1.35 mg/dL   Calcium 9.3 8.4 - 10.5 mg/dL   GFR calc non Af Amer 61 (L) >90 mL/min   GFR calc Af Amer 70 (L) >90 mL/min    Comment: (NOTE) The eGFR has been calculated using the CKD EPI equation. This calculation has not been validated in all clinical situations. eGFR's persistently <90 mL/min signify possible Chronic Kidney Disease.    Anion gap 9 5 - 15     HEENT: normal and extensive gold caps Cardio: RRR and no murmurs Resp: CTA B/L and unlabored GI: BS positive and nontender nondistended Extremity:  No Edema Skin:   Intact Neuro: Lethargic, Flat, Abnormal Sensory reduced sensation in the left lower extremity greater than left upper extremity to pinch, difficult to assess secondary to affect, Abnormal Motor 5/5 in the right deltoid, biceps, triceps, grip, hip flexor, knee extensor, ankle dorsiflexor and plantar flexor and Inattention 0/5 in the left deltoid biceps triceps, trace finger flexors, 0/5 and left hip flexor knee extensor and ankle  dorsiflexor  Musc/Skel:  Other no pain with range of motion of the left upper or left lower limb. Gen. no acute distress   Assessment/Plan: 1. Functional deficits secondary to right ACA infarct with left hemiparesis as well as cognitive deficits which require 3+ hours per day of interdisciplinary therapy in a comprehensive inpatient rehab setting. Physiatrist is providing close team supervision and 24 hour management of active medical problems listed below. Physiatrist and rehab team continue to assess barriers to discharge/monitor patient progress toward functional and medical goals. Team conference today please see physician documentation under team conference tab, met with team face-to-face to discuss problems,progress, and goals. Formulized individual treatment plan based on medical history, underlying problem and comorbidities. FIM: FIM - Bathing Bathing Steps Patient Completed: Chest, Left Arm, Abdomen, Right upper leg, Left upper leg, Front perineal area Bathing: 1: Two helpers  FIM - Upper Body Dressing/Undressing Upper body dressing/undressing steps patient completed: Thread/unthread right sleeve of pullover shirt/dresss, Put head through opening of pull over shirt/dress Upper body dressing/undressing: 3: Mod-Patient completed 50-74% of tasks FIM - Lower Body Dressing/Undressing Lower body dressing/undressing: 1: Two helpers  FIM - Toileting Toileting steps completed by patient: Performs perineal hygiene Toileting: 2: Max-Patient completed 1 of 3 steps  FIM - Radio producer Devices: Recruitment consultant Transfers: 1-Two helpers  FIM - Engineer, site  Assistive Devices: Arm rests Bed/Chair Transfer: 2: Sit > Supine: Max A (lifting assist/Pt. 25-49%), 1: Two helpers  FIM - Locomotion: Wheelchair Distance: 100 Locomotion: Wheelchair: 2: Travels 50 - 149 ft with maximal assistance (Pt: 25 - 49%) FIM - Locomotion:  Ambulation Locomotion: Ambulation Assistive Devices: Other (comment) (ACE bandage for L ankle dorsiflexion) Ambulation/Gait Assistance: 1: +2 Total assist, Other (comment) (3 musketeers) Locomotion: Ambulation: 1: Two helpers  Comprehension Comprehension Mode: Auditory Comprehension: 5-Follows basic conversation/direction: With extra time/assistive device  Expression Expression Mode: Verbal Expression: 5-Expresses basic 90% of the time/requires cueing < 10% of the time.  Social Interaction Social Interaction: 3-Interacts appropriately 50 - 74% of the time - May be physically or verbally inappropriate.  Problem Solving Problem Solving: 3-Solves basic 50 - 74% of the time/requires cueing 25 - 49% of the time  Memory Memory: 2-Recognizes or recalls 25 - 49% of the time/requires cueing 51 - 75% of the time  Medical Problem List and Plan: 1. Functional deficits secondary to right ACA infarct embolic secondary to unknown source 2.  DVT Prophylaxis/Anticoagulation: Subcutaneous Lovenox. Monitor platelet counts of any signs of bleeding. Venous Doppler studies negative 3. Pain Management: Tylenol as needed 4. Dysphagia/left side inattention. Mechanical soft diet. Follow-up speech therapy 5. Neuropsych: This patient is capable of making decisions on his own behalf. 6. Skin/Wound Care: nutrition/skin checks 7. Fluids/Electrolytes/Nutrition: encourage po. Checking labs 8. Hypertension with poor medical compliance. Norvasc 10 mg daily, Coreg 6.25 mg twice a day, lisinopril 40 mg daily.   Increase hydrochlorothiazide 25 mg per day, monitor potassium 9. Urine drug screen positive for marijuana. Counseling 10. Hyperlipidemia. Lipitor 11. Urinary retention , Voiding trial  LOS (Days) 6 A FACE TO FACE EVALUATION WAS PERFORMED  [name] Short 11/11/2014, 9:49 AM

## 2014-11-11 NOTE — Progress Notes (Signed)
Social Work Patient ID: [name] Short, male   DOB: 1965/08/18, 49 y.o.   MRN: [id number] Met with pt and girlfriend to discuss team conference goals-min/mod level and discharge 5/12.  She is hopeful pt will do better than team expects. Applications for disability and Medicaid on Friday at 10;30 am.  Girlfriend wanted to know if rehab would discharge pt not being able to walk when on  Discharge.  Discussed the goals again and will need to wait and see how he does.  Will encourage her to attend therapies with pt to see his progress and  Be realistic regarding his care needs at home.

## 2014-11-12 ENCOUNTER — Inpatient Hospital Stay (HOSPITAL_COMMUNITY): Payer: Self-pay | Admitting: Occupational Therapy

## 2014-11-12 ENCOUNTER — Inpatient Hospital Stay (HOSPITAL_COMMUNITY): Payer: Self-pay | Admitting: Rehabilitation

## 2014-11-12 ENCOUNTER — Inpatient Hospital Stay (HOSPITAL_COMMUNITY): Payer: Self-pay | Admitting: Speech Pathology

## 2014-11-12 NOTE — Progress Notes (Signed)
Physical Therapy Session Note  Patient Details  Name: Hassel NethCarl Sheppard MRN: [id number] Date of Birth: Nov 18, 1965  Today's Date: 11/12/2014 PT Individual Time: 1100-1200 PT Individual Time Calculation (min): 60 min   Short Term Goals: Week 1:  PT Short Term Goal 1 (Week 1): Pt will perform rolling R with bed rail max A and 100% cues. PT Short Term Goal 2 (Week 1): Pt will perform rolling L with bed rail mod A and 75% cues.  PT Short Term Goal 3 (Week 1): Pt will perform supine to sit total A of single therapist with 100% cues and HOB flat using rail. PT Short Term Goal 4 (Week 1): Pt will perform sit to supine total A of 1 with 100% and HOB flat. PT Short Term Goal 5 (Week 1): Pt will perform dynamic sitting balance with distractions for 2 minutes with bilat LE support, RUE support requiring max A.  Skilled Therapeutic Interventions/Progress Updates:   Pt received sitting in w/c in room, agreeable to therapy session.  Assisted pt to/from therapy gym via w/c at total A level for time management and energy conservation.  Skilled session focused on ensuring w/c back and cushion properly set up for best alignment and postural control as well as dynamic standing reaching task and gait to address upright posture, midline orientation, scanning L environment, sustained attention, and to decrease pusher tendencies.  PT supervisor in during session to assist with adjusting back and adding wedges to L side to decrease pelvic obliquity as well as small abd pad to further decrease obliquity.  Pt tolerated well and performed transfer squat pivot to therapy mat at max assist level, however when attempting stand pivot to the L, requires +2 A due to increased pushing.  Performed standing task reaching far to the R to place horseshoes handed to him on the L to address as above.  Ended session with two bouts of gait x 45' x 1 and another 3450' x 1 with +2 A via "three muskateer style"  With PT on L to assist with forward  advancement of LLE and placement as well as forward weight shift over LLE during stance while therapist on the R assisted with upright posture/trunk extension as well as increased R weight shift for better L foot clearance and also over LLE.  Tolerated well.  Assisted pt back to room with gf now present.  Quick release belt donned and all needs in reach.   Therapy Documentation Precautions:  Precautions Precautions: Fall Precaution Comments: Rt gaze preference, decreased attention to Lt Restrictions Weight Bearing Restrictions: No   Vital Signs: Therapy Vitals Temp: 99.1 F (37.3 C) Temp Source: Oral Pulse Rate: 77 Resp: 18 BP: 107/61 mmHg Patient Position (if appropriate): Lying Oxygen Therapy SpO2: 95 % O2 Device: Not Delivered Pain: Pt with no c/o pain during session.    Locomotion : Ambulation Ambulation/Gait Assistance: 1: +2 Total assist  See FIM for current functional status  Therapy/Group: Individual Therapy  Vista Deckarcell, Verlee Pope Ann 11/12/2014, 4:24 PM

## 2014-11-12 NOTE — Progress Notes (Signed)
At approximately 12:45 pm patient called to be assisted to bed due to hip pain. Patient reminded of therapy sessions that would be starting at 1300 pm. However, 2 nurse techs went in to assist patient. In an attempt to put patient's shoes on to avoid slipping, wife pushed nurse tech with her body to get to patient. She then proceed to transfer patient on her own. Also in an attempt to change patient's soiled brief, wife told both nurse tech that she would change him herself.  It was explained to the wife the importance of allowing staff to assist with transfers. Instructed patient's wife she can attend one of the PT sessions, to learn safe transfers. Safety plan reinforced to wife, needs continued safety education, and emotional support. [place]

## 2014-11-12 NOTE — Progress Notes (Signed)
Speech Language Pathology Daily Session Note  Patient Details  Name: [name] Short MRN: [id number] Date of Birth: 1965-08-21  Today's Date: 11/12/2014 SLP Individual Time: 1004-1030 SLP Individual Time Calculation (min): 26 min  Short Term Goals: Week 1: SLP Short Term Goal 1 (Week 1): Pt will tolerate dys 3 diet with thin liquids using comp strategies with min A. SLP Short Term Goal 1 - Progress (Week 1): Progressing toward goal SLP Short Term Goal 2 (Week 1): Pt will be oriented to person, place, time, situation with external supports with mod A. SLP Short Term Goal 2 - Progress (Week 1): Progressing toward goal SLP Short Term Goal 3 (Week 1): Pt will follow basic 2 step commands in fuctional tasks with mod A. SLP Short Term Goal 3 - Progress (Week 1): Progressing toward goal SLP Short Term Goal 4 (Week 1): Pt will sustain attention to a functional task for 5-10 minutes with mod A. SLP Short Term Goal 4 - Progress (Week 1): Progressing toward goal SLP Short Term Goal 5 (Week 1): Pt will attend to the L side in basic functional task with mod A. SLP Short Term Goal 5 - Progress (Week 1): Progressing toward goal SLP Short Term Goal 6 (Week 1): Pt will identify 2 cognitive deficits and 2 physical deficits with mod A. SLP Short Term Goal 6 - Progress (Week 1): Progressing toward goal  Skilled Therapeutic Interventions:  Pt was seen for skilled ST targeting cognitive goals.  Upon arrival, pt was seated upright in wheelchair, awake, alert, and agreeable to participate in ST.  SLP facilitated the session with a basic scavenger hunt activity targeting visual scanning to the left and sustained attention to task. Pt located items on the left of the environment with max verbal, visual, and tactile cues which SLP was able to fade to mod visual and verbal cues with repetition. Pt also utilized a written aid to facilitate recall of targeted scavenger hunt items with mod assist verbal and visual cues. Pt  sustained his attention to task and initiated appropriate responses to SLP questions with mod verbal cues.  Pt is making good progress towards goals.  Continue per current plan of care.     FIM:  Comprehension Comprehension Mode: Auditory Comprehension: 5-Follows basic conversation/direction: With extra time/assistive device Expression Expression Mode: Verbal Expression: 4-Expresses basic 75 - 89% of the time/requires cueing 10 - 24% of the time. Needs helper to occlude trach/needs to repeat words. Social Interaction Social Interaction: 3-Interacts appropriately 50 - 74% of the time - May be physically or verbally inappropriate. Problem Solving Problem Solving: 3-Solves basic 50 - 74% of the time/requires cueing 25 - 49% of the time Memory Memory: 2-Recognizes or recalls 25 - 49% of the time/requires cueing 51 - 75% of the time  Pain Pain Assessment Pain Assessment: No/denies pain  Therapy/Group: Individual Therapy  Pacen Watford, [name] 11/12/2014, 7:58 PM

## 2014-11-12 NOTE — Progress Notes (Signed)
Subjective/Complaints: No issues overnite, no pains, participating with therapy Review of systems unable to obtain secondary to cognitive status Objective: Vital Signs: Blood pressure 148/86, pulse 78, temperature 98.7 F (37.1 C), temperature source Oral, resp. rate 18, weight 91.4 kg (201 lb 8 oz), SpO2 98 %. No results found. Results for orders placed or performed during the hospital encounter of 11/05/14 (from the past 72 hour(s))  Glucose, capillary     Status: None   Collection Time: 11/09/14  4:54 PM  Result Value Ref Range   Glucose-Capillary 82 70 - 99 mg/dL  Basic metabolic panel     Status: Abnormal   Collection Time: 11/10/14  1:05 PM  Result Value Ref Range   Sodium 135 135 - 145 mmol/L   Potassium 4.0 3.5 - 5.1 mmol/L   Chloride 101 96 - 112 mmol/L   CO2 25 19 - 32 mmol/L   Glucose, Bld 98 70 - 99 mg/dL   BUN 20 6 - 23 mg/dL   Creatinine, Ser 1.36 (H) 0.50 - 1.35 mg/dL   Calcium 9.3 8.4 - 10.5 mg/dL   GFR calc non Af Amer 61 (L) >90 mL/min   GFR calc Af Amer 70 (L) >90 mL/min    Comment: (NOTE) The eGFR has been calculated using the CKD EPI equation. This calculation has not been validated in all clinical situations. eGFR's persistently <90 mL/min signify possible Chronic Kidney Disease.    Anion gap 9 5 - 15     HEENT: normal and extensive gold caps Cardio: RRR and no murmurs Resp: CTA B/L and unlabored GI: BS positive and nontender nondistended Extremity:  No Edema Skin:   Intact Neuro: Lethargic, Flat, Abnormal Sensory reduced sensation in the left lower extremity greater than left upper extremity to pinch, difficult to assess secondary to affect, Abnormal Motor 5/5 in the right deltoid, biceps, triceps, grip, hip flexor, knee extensor, ankle dorsiflexor and plantar flexor and Inattention 0/5 in the left deltoid biceps triceps, trace finger flexors, 0/5 and left hip flexor knee extensor and ankle dorsiflexor Has grasp with Left hand no  release Musc/Skel:  Other no pain with range of motion of the left upper or left lower limb. Gen. no acute distress   Assessment/Plan: 1. Functional deficits secondary to right ACA infarct with left hemiparesis as well as cognitive deficits which require 3+ hours per day of interdisciplinary therapy in a comprehensive inpatient rehab setting. Physiatrist is providing close team supervision and 24 hour management of active medical problems listed below. Physiatrist and rehab team continue to assess barriers to discharge/monitor patient progress toward functional and medical goals.  FIM: FIM - Bathing Bathing Steps Patient Completed: Chest, Left Arm, Abdomen, Right upper leg, Left upper leg Bathing: 1: Two helpers  FIM - Upper Body Dressing/Undressing Upper body dressing/undressing steps patient completed: Thread/unthread right sleeve of pullover shirt/dresss, Put head through opening of pull over shirt/dress Upper body dressing/undressing: 3: Mod-Patient completed 50-74% of tasks FIM - Lower Body Dressing/Undressing Lower body dressing/undressing: 1: Total-Patient completed less than 25% of tasks  FIM - Toileting Toileting steps completed by patient: Performs perineal hygiene Toileting: 1: Two helpers  FIM - Radio producer Devices: Elevated toilet seat, Grab bars Toilet Transfers: 3-To toilet/BSC: Mod A (lift or lower assist), 2-From toilet/BSC: Max A (lift and lower assist), 1-Two helpers (2 helpers for transfer toilet to w/c)  FIM - Control and instrumentation engineer Devices: Arm rests, Bed rails Bed/Chair Transfer: 0: Activity did not occur  FIM - Locomotion: Wheelchair Distance: 100 Locomotion: Wheelchair: 1: Total Assistance/staff pushes wheelchair (Pt<25%) FIM - Locomotion: Ambulation Locomotion: Ambulation Assistive Devices: Other (comment) (rail on R, therapist under LUE) Ambulation/Gait Assistance: 1: +2 Total assist, Other  (comment) (TotalA of 1, +2 for w/c follow) Locomotion: Ambulation: 1: Two helpers  Comprehension Comprehension Mode: Auditory Comprehension: 5-Follows basic conversation/direction: With extra time/assistive device  Expression Expression Mode: Verbal Expression: 5-Expresses basic 90% of the time/requires cueing < 10% of the time.  Social Interaction Social Interaction: 3-Interacts appropriately 50 - 74% of the time - May be physically or verbally inappropriate.  Problem Solving Problem Solving: 3-Solves basic 50 - 74% of the time/requires cueing 25 - 49% of the time  Memory Memory: 2-Recognizes or recalls 25 - 49% of the time/requires cueing 51 - 75% of the time  Medical Problem List and Plan: 1. Functional deficits secondary to right ACA infarct embolic secondary to unknown source 2.  DVT Prophylaxis/Anticoagulation: Subcutaneous Lovenox. Monitor platelet counts of any signs of bleeding. Venous Doppler studies negative 3. Pain Management: Tylenol as needed 4. Dysphagia/left side inattention. Mechanical soft diet. Follow-up speech therapy 5. Neuropsych: This patient is capable of making decisions on his own behalf.Trial ritalin for attn , concentration and initiation 6. Skin/Wound Care: nutrition/skin checks 7. Fluids/Electrolytes/Nutrition: encourage po. Checking labs 8. Hypertension with poor medical compliance. Norvasc 10 mg daily, Coreg 6.25 mg twice a day, lisinopril 40 mg daily.   Increase hydrochlorothiazide 25 mg per day, monitor potassium 9. Urine drug screen positive for marijuana. Counseling 10. Hyperlipidemia. Lipitor   LOS (Days) 7 A FACE TO FACE EVALUATION WAS PERFORMED  KIRSTEINS,ANDREW E 11/12/2014, 8:59 AM

## 2014-11-12 NOTE — Progress Notes (Signed)
Occupational Therapy Weekly Progress Note  Patient Details  Name: [name] Short MRN: [id number] Date of Birth: [birth year]  Beginning of progress report period: November 06, 2014 End of progress report period: November 12, 2014  Today's Date: 11/12/2014 OT Individual Time: 3662-9476 and 1305-1350 OT Individual Time Calculation (min): 60 min and 45 min   Patient has met 3 of 4 short term goals.  Pt is making steady progress towards goals.  Pt is more alert and engaged in therapy sessions, however continues to require increased time for processing of direction and initiation to complete task.  Pt has progressed to max assist of one caregiver with squat pivot transfers when transferring to pt's Rt side, continuing to require +2 for safety with transferring to Lt.  Pt easily distracted in moderately distracting environment, requiring constant cues for redirection and attention to task.    Patient continues to demonstrate the following deficits: Lt hemiparesis, Rt gaze preference, LUE impaired sensation and proprioception, impaired initiation, processing speed, and sustained attention, decreased sitting and standing balance and therefore will continue to benefit from skilled OT intervention to enhance overall performance with BADL and Reduce care partner burden.  Patient progressing toward long term goals..  Continue plan of care.  OT Short Term Goals Week 1:  OT Short Term Goal 1 (Week 1): Pt will complete bathing with max assist of 1 caregiver at bed level to EOB OT Short Term Goal 1 - Progress (Week 1): Met OT Short Term Goal 2 (Week 1): Pt will complete UB dressing with mod assist OT Short Term Goal 2 - Progress (Week 1): Met OT Short Term Goal 3 (Week 1): Pt will maintain static sititng balance at EOB for 1 min in preparation for self-care task OT Short Term Goal 3 - Progress (Week 1): Met OT Short Term Goal 4 (Week 1): Pt will complete toilet transfer to drop arm BSC with max assist  OT Short  Term Goal 4 - Progress (Week 1): Partly met Week 2:  OT Short Term Goal 1 (Week 2): Pt will complete transfer on/off toilet with mod assist of one caregiver OT Short Term Goal 2 (Week 2): Pt will complete bathing at sit > stand level with mod assist of 1 caregiver OT Short Term Goal 3 (Week 2): Pt will complete LB dressing with max assist OT Short Term Goal 4 (Week 2): Pt will complete UB dressing with min assist  Skilled Therapeutic Interventions/Progress Updates:    1) Engaged in ADL retraining with focus on sitting balance, sit <> stand, attention to task, initiation, and carryover of education.  Bathing and dressing completed seated at EOB with pt demonstrating increased sitting balance with only supervision during self-care tasks.  Mod assist sit > stand with therapist providing tactile cues at Lt knee and trunk to promote upright standing while 2nd person pulled pants over hips.  Squat pivot transfer bed > w/c with mod assist with +2 for safety to stabilize w/c.  Pt with increased initiation and sequencing with transfer and hemi-dressing technique.  Pt seated in standard w/c with specialty cushion and rigid back to promote increased stability in sitting and address Rt trunk flexion.  Pt with improved sitting balance in w/c.  Left seated in w/c with quick release belt in place and RN present in room.  2) Engaged in skilled treatment session with focus on NMR with sitting balance, sit <> stand, and transfers.  Addressed LUE activation with reaching for items to increase activation, noting supination/pronation and  elbow flexion.  Trace shoulder activation this session against gravity and with gravity minimized.  Addressing trunk control with reaching to facilitate progress towards lift off of buttocks as needed for sit <> stand and transfers.  Pt required blocking at Lt knee with all reaching and sit > stand tasks.  In standing engaged in lateral weight shifts to promote weighting and unweighting of RLE  as needed for stepping and transfers.  Squat pivot transfers to/from arm chair with focus on trunk control, sequencing, and control with movement with mod assist to Rt and +2 only for safety to Lt.  Pt left in bed as reporting fatigue and pain in Lt hip from sitting in w/c.  Therapy Documentation Precautions:  Precautions Precautions: Fall Precaution Comments: Rt gaze preference, decreased attention to Lt Restrictions Weight Bearing Restrictions: No General:   Vital Signs:  Pain:   ADL:   Exercises:   Other Treatments:    See FIM for current functional status  Therapy/Group: Individual Therapy  Simonne Come 11/12/2014, 12:29 PM

## 2014-11-13 ENCOUNTER — Inpatient Hospital Stay (HOSPITAL_COMMUNITY): Payer: Self-pay | Admitting: Rehabilitation

## 2014-11-13 ENCOUNTER — Inpatient Hospital Stay (HOSPITAL_COMMUNITY): Payer: Self-pay | Admitting: Occupational Therapy

## 2014-11-13 ENCOUNTER — Inpatient Hospital Stay (HOSPITAL_COMMUNITY): Payer: Self-pay | Admitting: Speech Pathology

## 2014-11-13 ENCOUNTER — Inpatient Hospital Stay (HOSPITAL_COMMUNITY): Payer: Medicaid Other | Admitting: Occupational Therapy

## 2014-11-13 MED ORDER — WHITE PETROLATUM GEL
Status: AC
Start: 2014-11-13 — End: 2014-11-13
  Administered 2014-11-13: 0.2
  Filled 2014-11-13: qty 1

## 2014-11-13 MED ORDER — METHYLPHENIDATE HCL 5 MG PO TABS
10.0000 mg | ORAL_TABLET | Freq: Two times a day (BID) | ORAL | Status: DC
  Administered 2014-11-13 – 2014-11-18 (×10): 10 mg via ORAL
  Filled 2014-11-13 (×10): qty 2

## 2014-11-13 NOTE — Progress Notes (Signed)
Occupational Therapy Session Note  Patient Details  Name: [name] Short MRN: [id number] Date of Birth: 1966/05/05  Today's Date: 11/13/2014 OT Individual Time:  - 1545-16 50  (65 min)      Short Term Goals: Week 1:  OT Short Term Goal 1 (Week 1): Pt will complete bathing with max assist of 1 caregiver at bed level to EOB OT Short Term Goal 1 - Progress (Week 1): Met OT Short Term Goal 2 (Week 1): Pt will complete UB dressing with mod assist OT Short Term Goal 2 - Progress (Week 1): Met OT Short Term Goal 3 (Week 1): Pt will maintain static sititng balance at EOB for 1 min in preparation for self-care task OT Short Term Goal 3 - Progress (Week 1): Met OT Short Term Goal 4 (Week 1): Pt will complete toilet transfer to drop arm BSC with max assist  OT Short Term Goal 4 - Progress (Week 1): Partly met Week 2:  OT Short Term Goal 1 (Week 2): Pt will complete transfer on/off toilet with mod assist of one caregiver OT Short Term Goal 2 (Week 2): Pt will complete bathing at sit > stand level with mod assist of 1 caregiver OT Short Term Goal 3 (Week 2): Pt will complete LB dressing with max assist OT Short Term Goal 4 (Week 2): Pt will complete UB dressing with min assist  Skilled Therapeutic Interventions/Progress Updates:    Focus on static to dynamic sitting balance, transfers, Lt attention, LUE NMR.  Pt sitting in wc upon OT arrival.  Propelled pt in wc to gym.  Transferred to mat with +2, pt= 50 %.  Practice sitting balance, postural control in midline, attending to left with objects.  Pt able to find objects on left with increased time and multi cues. Pt reported increased back pain while sitting on mat.  Instructed pt to lie on right side and rest his back.  Pt went from sitting to lying with min assist.  Practiced LUE Neuro stretch while in this position.  Pt stated he felt some pulling on LUE.  Used ball to hold with BUE and attend in middle.  Pt needed max assist with LUE for holding ball.   Pt came to sitting with max assist.  Transferred to wc (going to left) with scooting with max assist.+1.  Pt transferred back to bed with max assist +1.  Pt needed total assist +2 to get positioned in bed.   Remained in bed with bed alarm on and call bell,phone within reach.     Therapy Documentation Precautions:  Precautions Precautions: Fall Precaution Comments: Rt gaze preference, decreased attention to Lt Restrictions Weight Bearing Restrictions: No      Pain: Pain Assessment Pain Assessment: Back pain  = 5/10          See FIM for current functional status  Therapy/Group: Individual Therapy  Lisa Roca 11/13/2014, 5:01 PM

## 2014-11-13 NOTE — Progress Notes (Signed)
Subjective/Complaints: Standing better per PT, very distractible in therapy. Response latency is improving. Review of systems unable to obtain secondary to cognitive status Objective: Vital Signs: Blood pressure 135/94, pulse 78, temperature 98.6 F (37 C), temperature source Oral, resp. rate 18, weight 91.4 kg (201 lb 8 oz), SpO2 98 %. No results found. Results for orders placed or performed during the hospital encounter of 11/05/14 (from the past 72 hour(s))  Basic metabolic panel     Status: Abnormal   Collection Time: 11/10/14  1:05 PM  Result Value Ref Range   Sodium 135 135 - 145 mmol/L   Potassium 4.0 3.5 - 5.1 mmol/L   Chloride 101 96 - 112 mmol/L   CO2 25 19 - 32 mmol/L   Glucose, Bld 98 70 - 99 mg/dL   BUN 20 6 - 23 mg/dL   Creatinine, Ser 1.36 (H) 0.50 - 1.35 mg/dL   Calcium 9.3 8.4 - 10.5 mg/dL   GFR calc non Af Amer 61 (L) >90 mL/min   GFR calc Af Amer 70 (L) >90 mL/min    Comment: (NOTE) The eGFR has been calculated using the CKD EPI equation. This calculation has not been validated in all clinical situations. eGFR's persistently <90 mL/min signify possible Chronic Kidney Disease.    Anion gap 9 5 - 15     HEENT: normal and extensive gold caps Cardio: RRR and no murmurs Resp: CTA B/L and unlabored GI: BS positive and nontender nondistended Extremity:  No Edema Skin:   Intact Neuro: Lethargic, Flat, Abnormal Sensory reduced sensation in the left lower extremity greater than left upper extremity to pinch, difficult to assess secondary to affect, Abnormal Motor 5/5 in the right deltoid, biceps, triceps, grip, hip flexor, knee extensor, ankle dorsiflexor and plantar flexor and Inattention 0/5 in the left deltoid biceps triceps, trace finger flexors, 0/5 and left hip flexor knee extensor and ankle dorsiflexor Has grasp with Left hand no release Musc/Skel:  Other no pain with range of motion of the left upper or left lower limb. Gen. no acute  distress   Assessment/Plan: 1. Functional deficits secondary to right ACA infarct with left hemiparesis as well as cognitive deficits which require 3+ hours per day of interdisciplinary therapy in a comprehensive inpatient rehab setting. Physiatrist is providing close team supervision and 24 hour management of active medical problems listed below. Physiatrist and rehab team continue to assess barriers to discharge/monitor patient progress toward functional and medical goals. 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 FIM: FIM - Bathing Bathing Steps Patient Completed: Chest, Left Arm, Abdomen, Right upper leg, Left upper leg Bathing: 3: Mod-Patient completes 5-7 [id number] parts or 50-74%  FIM - Upper Body Dressing/Undressing Upper body dressing/undressing steps patient completed: Thread/unthread right sleeve of pullover shirt/dresss, Put head through opening of pull over shirt/dress Upper body dressing/undressing: 3: Mod-Patient completed 50-74% of tasks FIM - Lower Body Dressing/Undressing Lower body dressing/undressing: 1: Two helpers  FIM - Toileting Toileting steps completed by patient: Performs perineal hygiene Toileting: 1: Two helpers  FIM - TRadio producerDevices: Elevated toilet seat, Grab bars Toilet Transfers: 3-To toilet/BSC: Mod A (lift or lower assist), 2-From toilet/BSC: Max A (lift and lower assist), 1-Two helpers (2 helpers for transfer toilet to w/c)  FIM - BControl and instrumentation engineerDevices: Arm rests, Bed rails Bed/Chair Transfer: 1: Two helpers (w/c to/from mat)  FIM - Locomotion: Wheelchair  Distance: 100 Locomotion: Wheelchair: 0: Activity did not occur FIM - Locomotion: Ambulation Locomotion: Ambulation Assistive Devices: Other (comment) ("three muskateer style") Ambulation/Gait Assistance: 1: +2 Total assist Locomotion: Ambulation: 1:  Two helpers  Comprehension Comprehension Mode: Auditory Comprehension: 5-Follows basic conversation/direction: With extra time/assistive device  Expression Expression Mode: Verbal Expression: 4-Expresses basic 75 - 89% of the time/requires cueing 10 - 24% of the time. Needs helper to occlude trach/needs to repeat words.  Social Interaction Social Interaction: 3-Interacts appropriately 50 - 74% of the time - May be physically or verbally inappropriate.  Problem Solving Problem Solving: 3-Solves basic 50 - 74% of the time/requires cueing 25 - 49% of the time  Memory Memory: 2-Recognizes or recalls 25 - 49% of the time/requires cueing 51 - 75% of the time  Medical Problem List and Plan: 1. Functional deficits secondary to right ACA infarct embolic secondary to unknown source 2.  DVT Prophylaxis/Anticoagulation: Subcutaneous Lovenox. Monitor platelet counts of any signs of bleeding. Venous Doppler studies negative 3. Pain Management: Tylenol as needed 4. Dysphagia/left side inattention. Mechanical soft diet. Follow-up speech therapy 5. Neuropsych: This patient is capable of making decisions on his own behalf.titrate ritalin for attn , concentration and initiation 6. Skin/Wound Care: nutrition/skin checks 7. Fluids/Electrolytes/Nutrition: encourage po. Checking labs 8. Hypertension with poor medical compliance. Norvasc 10 mg daily, Coreg 6.25 mg twice a day, lisinopril 40 mg daily.   Increase hydrochlorothiazide 25 mg per day, monitor potassium 9. Urine drug screen positive for marijuana. Counseling 10. Hyperlipidemia. Lipitor   LOS (Days) 8 A FACE TO FACE EVALUATION WAS PERFORMED  KIRSTEINS,ANDREW E 11/13/2014, 9:05 AM

## 2014-11-13 NOTE — Progress Notes (Signed)
Occupational Therapy Session Note  Patient Details  Name: [name] Short MRN: [id number] Date of Birth: May 31, 1966  Today's Date: 11/13/2014 OT Individual Time: 0930-1030 OT Individual Time Calculation (min): 60 min    Short Term Goals: Week 2:  OT Short Term Goal 1 (Week 2): Pt will complete transfer on/off toilet with mod assist of one caregiver OT Short Term Goal 2 (Week 2): Pt will complete bathing at sit > stand level with mod assist of 1 caregiver OT Short Term Goal 3 (Week 2): Pt will complete LB dressing with max assist OT Short Term Goal 4 (Week 2): Pt will complete UB dressing with min assist  Skilled Therapeutic Interventions/Progress Updates:    1:1 Neuromuscular reeducation with focus on postural control, functional use of left UE as a gross assist, visual attention to left body and field, sustained attention to task in session, functional problem solving, awareness, following one step commands with increased amt of time for processing, etc. Pt already bathed LB and was dressed. Participated in UB bathing using left UE predominantly with min A to support against gravity. Pt with increased active movement and initiation since in left UE/ hand with automatic and familiar tasks. Pt participated in donning deodorant with increased automatic movement. Pt still continues to present with abnormal movement in left shoulder however focus on just initiation of any movement in UE. Sit to stand with focus on postural control and alignment with mirror for visual feedback, small and controlled weight shifts laterally with facilitation and tactile cues for weight shifts to the right. Also continued to focus on coordination of right hand and LE for w/c propelling in functional situation (in quiet hallway). Pt required max cuing to coordinate movements and attend to left side of hallway. Returned to room and left with girlfriend with safety belt on.   Therapy Documentation Precautions:   Precautions Precautions: Fall Precaution Comments: Rt gaze preference, decreased attention to Lt Restrictions Weight Bearing Restrictions: No Pain:  c/o off and on right side discomfort. Assisted pt with repositioning   See FIM for current functional status  Therapy/Group: Individual Therapy  Roney MansSmith, Robertine Kipper [name] Short 11/13/2014, 10:45 AM

## 2014-11-13 NOTE — Progress Notes (Signed)
Speech Language Pathology Weekly Progress and Session Note  Patient Details  Name: [name] Short MRN: [id number] Date of Birth: [birth year]  Beginning of progress report period: November 06, 2014 End of progress report period: November 13, 2014  Today's Date: 11/13/2014 SLP Individual Time: 1400-1500 SLP Individual Time Calculation (min): 60 min  Short Term Goals: Week 1: SLP Short Term Goal 1 (Week 1): Pt will tolerate dys 3 diet with thin liquids using comp strategies with min A. SLP Short Term Goal 1 - Progress (Week 1): Met SLP Short Term Goal 2 (Week 1): Pt will be oriented to person, place, time, situation with external supports with mod A. SLP Short Term Goal 2 - Progress (Week 1): Met SLP Short Term Goal 3 (Week 1): Pt will follow basic 2 step commands in fuctional tasks with mod A. SLP Short Term Goal 3 - Progress (Week 1): Met SLP Short Term Goal 4 (Week 1): Pt will sustain attention to a functional task for 5-10 minutes with mod A. SLP Short Term Goal 4 - Progress (Week 1): Progressing toward goal SLP Short Term Goal 5 (Week 1): Pt will attend to the L side in basic functional task with mod A. SLP Short Term Goal 5 - Progress (Week 1): Met SLP Short Term Goal 6 (Week 1): Pt will identify 2 cognitive deficits and 2 physical deficits with mod A. SLP Short Term Goal 6 - Progress (Week 1): Progressing toward goal    New Short Term Goals: Week 2: SLP Short Term Goal 1 (Week 2): Pt will tolerate dys 3 textures and thin liquids with supervision cues for use of rate and portion control for three consecutive sessions prior to diet advancement.   SLP Short Term Goal 2 (Week 2): Pt will sustain attention to a functional task for 5-10 minutes with mod A. SLP Short Term Goal 3 (Week 2): Pt will attend to the L side during a basic functional task with min assist . SLP Short Term Goal 4 (Week 2): Pt will identify 2 cognitive deficits and 2 physical deficits with mod A question cues. SLP Short  Term Goal 5 (Week 2): Pt will complete basic functional problem solving tasks with mod assist verbal cues.    Weekly Progress Updates:  Pt made functional gains this reporting period and has met 4 out of 6 short term goals.  Pt currently requires mod-max assist for basic, functional cognitive tasks due to decreased sustained attention, poor initiation, decreased intellectual/emergent awareness of deficits, and left inattention.  Pt has demonstrated improvements in processing speed and response latency and has presented with brighter affect over the last several sessions.  Pt is consuming dys 3 textures with thin liquids with min verbal cues for use of rate and portion control to minimize overt s/s of aspiration with said diet.  Pt and family education is ongoing.  Pt would continue to benefit from skilled ST while inpatient in order to maximize functional independence and reduce burden of care prior to discharge.  Anticipate that pt will need extensive 24/7 supervision and ST follow up at next level of care.     Intensity: Minumum of 1-2 x/day, 30 to 90 minutes Frequency: 3 to 5 out of 7 days Duration/Length of Stay: 20-25 days Treatment/Interventions: Cognitive remediation/compensation;Cueing hierarchy;Dysphagia/aspiration precaution training;Internal/external aids;Functional tasks;Environmental controls;Multimodal communication approach;Therapeutic Exercise;Patient/family education;Therapeutic Activities;Speech/Language facilitation   Daily Session  Skilled Therapeutic Interventions: Pt was seen for skilled ST targeting goals for cognition and dysphagia.  Upon arrival, pt was in  bed, being changed by nurse tech.  Upon completion of hygiene, pt was transferred to wheelchair with assistance from SLP and nurse tech to maximize attention and alertness during structured therapeutic tasks.  SLP completed skilled observations with presentations of pt's currently prescribed diet.  Pt consumed dys 3 textures  and thin liquids with min verbal and visual cues for rate and portion control due to pt's impulsivity when self feeding.  No overt s/s of aspiration.  Pt needed min assist verbal cues to locate utensils when located on the left side of the tray.  Upon completion of meal, SLP facilitated the session with a basic card game targeting visual scanning to the left and sustained attention.  Pt attended to cards on the left side of the table with mod assist verbal and visual cues.  Pt also required mod-max assist verbal cues for redirection to task after 2-3 minute intervals.  Pt returned to room and left upright in wheelchair with quick release belt donned.  All needs left within reach.  Pt is making progress towards goals.  Goals updated on this date to reflect current progress and plan of care.         FIM:  Comprehension Comprehension Mode: Auditory Comprehension: 5-Follows basic conversation/direction: With extra time/assistive device Expression Expression Mode: Verbal Expression: 5-Expresses basic needs/ideas: With no assist Social Interaction Social Interaction: 4-Interacts appropriately 75 - 89% of the time - Needs redirection for appropriate language or to initiate interaction. Problem Solving Problem Solving: 3-Solves basic 50 - 74% of the time/requires cueing 25 - 49% of the time Memory Memory: 2-Recognizes or recalls 25 - 49% of the time/requires cueing 51 - 75% of the time FIM - Eating Eating Activity: 4: Helper checks for pocketed food;5: Needs verbal cues/supervision;5: Set-up assist for open containers;4: Help with picking up utensils General    Pain Pain Assessment Pain Assessment: No/denies pain  Therapy/Group: Individual Therapy  [name] Short, Selinda Orion 11/13/2014, 4:11 PM

## 2014-11-13 NOTE — Progress Notes (Signed)
Social Work Patient ID: [name] Short, male   DOB: 11-Apr-1966, 49 y.o.   MRN: [id number] [facility] here to start disability application.  [facility]  Will have PA complete pt's forms from work. Pt doing well today and feels he is making progress in therapies.  Girlfriend wants pt to be placed on the CAP waiting list, aware it is one year long.  This worker will do this.

## 2014-11-13 NOTE — Progress Notes (Signed)
Physical Therapy Weekly Progress Note  Patient Details  Name: [name] Short MRN: [id number] Date of Birth: [birth year]  Beginning of progress report period: November 06, 2014 End of progress report period: November 13, 2014  Today's Date: 11/13/2014 PT Individual Time: 0830-0930 PT Individual Time Calculation (min): 60 min   Patient has met 4 of 5 short term goals.  Pt making slow but steady progress towards STG's during this reporting period.  Continues to be severely limited by pusher tendencies, L inattention, decreased focused and sustained attention, decreased initiation, and decreased functional use of LUE/LE.  Note that GF attempting to transfer pt on her own per nursing notes, will provide max education as pt not safe to transfer with gf at this time as he is max to +2 A for transfers.    Patient continues to demonstrate the following deficits: decreased balance, decreased strength, perceptual deficits, decreased awareness, decreased attention and therefore will continue to benefit from skilled PT intervention to enhance overall performance with activity tolerance, balance, postural control, ability to compensate for deficits, functional use of  left upper extremity and left lower extremity, attention, awareness, coordination and knowledge of precautions.  Patient progressing toward long term goals..  Continue plan of care.  PT Short Term Goals Week 1:  PT Short Term Goal 1 (Week 1): Pt will perform rolling R with bed rail max A and 100% cues. PT Short Term Goal 1 - Progress (Week 1): Met PT Short Term Goal 2 (Week 1): Pt will perform rolling L with bed rail mod A and 75% cues.  PT Short Term Goal 2 - Progress (Week 1): Met PT Short Term Goal 3 (Week 1): Pt will perform supine to sit total A of single therapist with 100% cues and HOB flat using rail. PT Short Term Goal 3 - Progress (Week 1): Progressing toward goal PT Short Term Goal 4 (Week 1): Pt will perform sit to supine total A of 1  with 100% and HOB flat. PT Short Term Goal 4 - Progress (Week 1): Met PT Short Term Goal 5 (Week 1): Pt will perform dynamic sitting balance with distractions for 2 minutes with bilat LE support, RUE support requiring max A. PT Short Term Goal 5 - Progress (Week 1): Met Week 2:  PT Short Term Goal 1 (Week 2): Pt will perform all aspects of bed mobility with HOB flat and with rail at max A with 75% cues.  PT Short Term Goal 2 (Week 2): Pt will perform squat pivot transfers R or L with max A of single therapist with 75% cues.  PT Short Term Goal 3 (Week 2): Pt will perform dynamic sitting balance with single UE support x 3 mins in controlled environment at mod A level  PT Short Term Goal 4 (Week 2): Pt will perform sit<>stand with mod A and 50% cues.  PT Short Term Goal 5 (Week 2): Pt will perform gait x 25' w/ LRAD and max A with 75% cues and +2 for chair follow  Skilled Therapeutic Interventions/Progress Updates:   Pt received lying in bed, agreeable to therapy session.  Worked on initiation prior to getting OOB in order to finish taking pills.  Assisted to EOB with max A with HOB flat and without use of rails to better simulate home environment.  Once at EOB, assisted with donning shoes and pants and sit<>stand with +2 A in order to have RN pull up pants.  Transferred to the R to w/c via squat pivot,  however pt with increased difficulty requiring +2 A due to pusher tendencies.  Cues and facilitation for increased forward weight shift and trunk lean with decreasing use of RUE to prevent further pushing.  Assisted to/from therapy gym in order to work on tall kneeling position to work on [employer] through [place], midline orientation, WB through [place], weight shifting and reaching far to the R to address pusher tendencies and trunk lengthening on the R.  Pt requires +2A during task with cues for scanning L to identify color or clothes pin and matching with sports team then placing to the R on metal pole.  Assist  for WB through LUE and max A to weight shift to the R.  Requires continued max verbal and tactile cues for upright posture and to sustain attention in busy gym.  Also worked on weight shifting in [place] to further decrease pusher tendencies and while on EOM, worked on midline posture.  PT supervisor added increased abd wedge to w/c cushion for decreased pelvic obliquity.  Pt assisted back to w/c and handed off to OT for next session.    Therapy Documentation Precautions:  Precautions Precautions: Fall Precaution Comments: Rt gaze preference, decreased attention to Lt Restrictions Weight Bearing Restrictions: No   Vital Signs: Therapy Vitals Temp: 98.6 F (37 C) Temp Source: Oral Pulse Rate: 69 Resp: 18 BP: 122/77 mmHg Patient Position (if appropriate): Lying Oxygen Therapy SpO2: 98 % O2 Device: Not Delivered Pain: pt with no c/o pain during session.   See FIM for current functional status  Therapy/Group: Individual Therapy  Denice Bors 11/13/2014, 7:53 AM

## 2014-11-14 ENCOUNTER — Encounter (HOSPITAL_COMMUNITY): Payer: Self-pay | Admitting: Occupational Therapy

## 2014-11-14 DIAGNOSIS — I63521 Cerebral infarction due to unspecified occlusion or stenosis of right anterior cerebral artery: Secondary | ICD-10-CM

## 2014-11-14 DIAGNOSIS — R197 Diarrhea, unspecified: Secondary | ICD-10-CM

## 2014-11-14 DIAGNOSIS — I6931 Cognitive deficits following cerebral infarction: Secondary | ICD-10-CM

## 2014-11-14 DIAGNOSIS — G8190 Hemiplegia, unspecified affecting unspecified side: Secondary | ICD-10-CM

## 2014-11-14 LAB — URINALYSIS, ROUTINE W REFLEX MICROSCOPIC
Bilirubin Urine: NEGATIVE
Glucose, UA: NEGATIVE mg/dL
HGB URINE DIPSTICK: NEGATIVE
Ketones, ur: NEGATIVE mg/dL
LEUKOCYTES UA: NEGATIVE
NITRITE: NEGATIVE
Protein, ur: NEGATIVE mg/dL
SPECIFIC GRAVITY, URINE: 1.02 (ref 1.005–1.030)
UROBILINOGEN UA: 0.2 mg/dL (ref 0.0–1.0)
pH: 5 (ref 5.0–8.0)

## 2014-11-14 MED ORDER — DIPHENOXYLATE-ATROPINE 2.5-0.025 MG PO TABS: 1.0000 | ORAL_TABLET | Freq: Four times a day (QID) | ORAL | Status: DC | PRN

## 2014-11-14 NOTE — Progress Notes (Signed)
Occupational Therapy Session Note  Patient Details  Name: [name] Short MRN: [id number] Date of Birth: 09/28/1965  Today's Date: 11/14/2014 OT Individual Time: [id number] OT Individual Time Calculation (min): 45 min    Short Term Goals: Week 2:  OT Short Term Goal 1 (Week 2): Pt will complete transfer on/off toilet with mod assist of one caregiver OT Short Term Goal 2 (Week 2): Pt will complete bathing at sit > stand level with mod assist of 1 caregiver OT Short Term Goal 3 (Week 2): Pt will complete LB dressing with max assist OT Short Term Goal 4 (Week 2): Pt will complete UB dressing with min assist  Skilled Therapeutic Interventions/Progress Updates:  Upon entering the room, pt supine in bed with no c/o pain this session. Pt agreeable to bathing and dressing but declined toileting this morning. Max A supine >sit. Max A squat pivot to the R into wheelchair with second helper holding wheelchair for safety. Pt engaged in BADL tasks while seated in wheelchair at sink side. Pt requiring increased time and max verbal cues for initiation and sequencing of task. Pt reports, "I daydream a lot." STS from wheelchair with Mod A and total A for standing balance as he pushes heavy to the L. Pt standing and washing peri area and assist to wash buttocks. HOH assist for L UE to wash various parts of body and apply deodorant during session.   Second helper assisting with LB clothing management while therapist provided assist with balance. Pt locating 3/4 items to the L during session with increased time and min verbal cues for technique. Pt seated in wheelchair with QRB donned and call bell within reach upon exiting the room.   Therapy Documentation Precautions:  Precautions Precautions: Fall Precaution Comments: Rt gaze preference, decreased attention to Lt Restrictions Weight Bearing Restrictions: No  See FIM for current functional status  Therapy/Group: Individual Therapy  Lowella Gripittman, Litisha Guagliardo  L 11/14/2014, 12:37 PM

## 2014-11-14 NOTE — Progress Notes (Signed)
[name] NethCarl Short is a 49 y.o. male Jul 30, 1965 [id number]  Subjective: C/o diarrhea >1 wk. Pt had a low grade fever . Slept well. Feeling OK.  Objective: Vital signs in last 24 hours: Temp:  [98.2 F (36.8 C)-101.3 F (38.5 C)] 98.2 F (36.8 C) (04/23 0645) Pulse Rate:  [45-82] 75 (04/23 0645) Resp:  [18] 18 (04/23 0645) BP: (114-124)/(74-91) 124/89 mmHg (04/23 0645) SpO2:  [100 %] 100 % (04/23 0645) Weight change:  Last BM Date: 11/13/14  Intake/Output from previous day: 04/22 0701 - 04/23 0700 In: 120 [P.O.:120] Out: 150 [Urine:150] Last cbgs: CBG (last 3)  No results for input(s): GLUCAP in the last 72 hours.   Physical Exam General: No apparent distress   HEENT: not dry Lungs: Normal effort. Lungs clear to auscultation, no crackles or wheezes. Cardiovascular: Regular rate and rhythm, no edema Abdomen: S/NT/ND; BS(+) Musculoskeletal:  unchanged Neurological: No new neurological deficits Wounds: N/A    Skin: clear   Mental state: Alert, oriented, cooperative    Lab Results: BMET    Component Value Date/Time   NA 135 11/10/2014 1305   K 4.0 11/10/2014 1305   CL 101 11/10/2014 1305   CO2 25 11/10/2014 1305   GLUCOSE 98 11/10/2014 1305   BUN 20 11/10/2014 1305   CREATININE 1.36* 11/10/2014 1305   CALCIUM 9.3 11/10/2014 1305   GFRNONAA 61* 11/10/2014 1305   GFRAA 70* 11/10/2014 1305   CBC    Component Value Date/Time   WBC 6.7 11/06/2014 0611   RBC 4.61 11/06/2014 0611   HGB 13.6 11/06/2014 0611   HCT 42.7 11/06/2014 0611   PLT 169 11/06/2014 0611   MCV 92.6 11/06/2014 0611   MCH 29.5 11/06/2014 0611   MCHC 31.9 11/06/2014 0611   RDW 14.3 11/06/2014 0611   LYMPHSABS 1.1 11/06/2014 0611   MONOABS 0.9 11/06/2014 0611   EOSABS 0.6 11/06/2014 0611   BASOSABS 0.0 11/06/2014 0611    Studies/Results: No results found.  Medications: I have reviewed the patient's current medications.  Assessment/Plan:  1. Functional deficits secondary to right ACA  infarct embolic secondary to unknown source 2. DVT Prophylaxis/Anticoagulation: Subcutaneous Lovenox. Monitor platelet counts of any signs of bleeding. Venous Doppler studies negative 3. Pain Management: Tylenol as needed 4. Dysphagia/left side inattention. Mechanical soft diet. Follow-up speech therapy 5. Neuropsych: This patient is capable of making decisions on his own behalf.titrate ritalin for attn , concentration and initiation 6. Skin/Wound Care: nutrition/skin checks 7. Fluids/Electrolytes/Nutrition: encourage po. Checking labs 8. Hypertension with poor medical compliance. Norvasc 10 mg daily, Coreg 6.25 mg twice a day, lisinopril 40 mg daily.  Increase hydrochlorothiazide 25 mg per day, monitor potassium 9. Urine drug screen positive for marijuana. Counseling 10. Hyperlipidemia. Lipitor 11. Low grade fever. Check UA - OK 12. Diarrhea. Repeat C. Difficile test. Lomotil prn     Length of stay, days: 9  Sonda PrimesAlex Chrisanna Mishra , MD 11/14/2014, 12:19 PM

## 2014-11-15 ENCOUNTER — Inpatient Hospital Stay (HOSPITAL_COMMUNITY): Payer: Medicaid Other

## 2014-11-15 LAB — URINE CULTURE
COLONY COUNT: NO GROWTH
CULTURE: NO GROWTH
Special Requests: NORMAL

## 2014-11-15 LAB — CLOSTRIDIUM DIFFICILE BY PCR: Toxigenic C. Difficile by PCR: NEGATIVE

## 2014-11-15 NOTE — Progress Notes (Signed)
[name] Short is a 49 y.o. male 12/13/1965 [id number]  Subjective: C/o diarrhea >1 wk: better now. Pt had a low grade fever . Slept well. Feeling OK.  Objective: Vital signs in last 24 hours: Temp:  [98.3 F (36.8 C)-98.6 F (37 C)] 98.5 F (36.9 C) (04/24 0500) Pulse Rate:  [64-80] 64 (04/24 0500) Resp:  [17-18] 18 (04/24 0500) BP: (119-143)/(66-90) 119/90 mmHg (04/24 0500) SpO2:  [100 %] 100 % (04/24 0500) Weight change:  Last BM Date: 11/13/14  Intake/Output from previous day: 04/23 0701 - 04/24 0700 In: 1560 [P.O.:1560] Out: 1350 [Urine:1350] Last cbgs: CBG (last 3)  No results for input(s): GLUCAP in the last 72 hours.   Physical Exam General: No apparent distress   HEENT: not dry Lungs: Normal effort. Lungs clear to auscultation, no crackles or wheezes. Cardiovascular: Regular rate and rhythm, no edema Abdomen: S/NT/ND; BS(+) Musculoskeletal:  unchanged Neurological: No new neurological deficits Wounds: N/A    Skin: clear   Mental state: Alert, oriented, cooperative. Flat affect    Lab Results: BMET    Component Value Date/Time   NA 135 11/10/2014 1305   K 4.0 11/10/2014 1305   CL 101 11/10/2014 1305   CO2 25 11/10/2014 1305   GLUCOSE 98 11/10/2014 1305   BUN 20 11/10/2014 1305   CREATININE 1.36* 11/10/2014 1305   CALCIUM 9.3 11/10/2014 1305   GFRNONAA 61* 11/10/2014 1305   GFRAA 70* 11/10/2014 1305   CBC    Component Value Date/Time   WBC 6.7 11/06/2014 0611   RBC 4.61 11/06/2014 0611   HGB 13.6 11/06/2014 0611   HCT 42.7 11/06/2014 0611   PLT 169 11/06/2014 0611   MCV 92.6 11/06/2014 0611   MCH 29.5 11/06/2014 0611   MCHC 31.9 11/06/2014 0611   RDW 14.3 11/06/2014 0611   LYMPHSABS 1.1 11/06/2014 0611   MONOABS 0.9 11/06/2014 0611   EOSABS 0.6 11/06/2014 0611   BASOSABS 0.0 11/06/2014 0611    Studies/Results: No results found.  Medications: I have reviewed the patient's current medications.  Assessment/Plan:  1. Functional  deficits secondary to right ACA infarct embolic secondary to unknown source 2. DVT Prophylaxis/Anticoagulation: Subcutaneous Lovenox. Monitor platelet counts of any signs of bleeding. Venous Doppler studies negative 3. Pain Management: Tylenol as needed 4. Dysphagia/left side inattention. Mechanical soft diet. Follow-up speech therapy 5. Neuropsych: This patient is capable of making decisions on his own behalf.titrate ritalin for attn , concentration and initiation 6. Skin/Wound Care: nutrition/skin checks 7. Fluids/Electrolytes/Nutrition: encourage po. Checking labs 8. Hypertension with poor medical compliance. Norvasc 10 mg daily, Coreg 6.25 mg twice a day, lisinopril 40 mg daily.  Increase hydrochlorothiazide 25 mg per day, monitor potassium 9. Urine drug screen positive for marijuana. Counseling 10. Hyperlipidemia. Lipitor 11. Low grade fever. Check UA - OK 12. Diarrhea. Repeat C. Difficile test. Lomotil prn - better    Length of stay, days: 10  Sonda PrimesAlex Plotnikov , MD 11/15/2014, 8:20 AM

## 2014-11-15 NOTE — Progress Notes (Signed)
Occupational Therapy Session Note  Patient Details  Name: [name] Short MRN: [id number] Date of Birth: 06/25/1966  Today's Date: 11/15/2014 OT Individual Time: 1130-1200 OT Individual Time Calculation (min): 30 min    Short Term Goals: Week 2:  OT Short Term Goal 1 (Week 2): Pt will complete transfer on/off toilet with mod assist of one caregiver OT Short Term Goal 2 (Week 2): Pt will complete bathing at sit > stand level with mod assist of 1 caregiver OT Short Term Goal 3 (Week 2): Pt will complete LB dressing with max assist OT Short Term Goal 4 (Week 2): Pt will complete UB dressing with min assist  Skilled Therapeutic Interventions/Progress Updates: Therapeutic activity with emphasis on improved static/dynamic sitting balance, NMR of LUE, attention to left, and SPT transfer.   Pt received supine in bed and alert although with moderate delayed response to simple commands.   With extra time pt was able to complete roll to his left and sit at edge of bed with mod assist to to manage [facility] ([facility]   Pt required manual facilitation to place legs and intermittent min assist to maintain static sitting balance d/t left anterior lean.   Pt was educated on bil UE exercises, weight-shifting, reaching across midline and improved coordination with LUE using forward reaching with hands interlaced and then with left hand isolated.   Pt sustained attention to task with mod tactile cues and was able maintain static sitting balance unassisted for up to 30 seconds, 2 times.    From edge of bed, pt completed stand-pivot transfer to w/c with mod assist X1 and mod instructional cues for technique to sequence in prep for noon meal.   Pt often referred to left UE as "crazy arm" but demonstrated persistence with tasks presented despite delay motor function.     Therapy Documentation Precautions:  Precautions Precautions: Fall Precaution Comments: Rt gaze preference, decreased attention to  Lt Restrictions Weight Bearing Restrictions: No   Pain: Pain Assessment Pain Score: 0-No pain   See FIM for current functional status  Therapy/Group: Individual Therapy  [name] 11/15/2014, 1:00 PM

## 2014-11-15 NOTE — Plan of Care (Signed)
Problem: RH BLADDER ELIMINATION Goal: RH STG MANAGE BLADDER WITH ASSISTANCE STG Manage Bladder With mod Assistance  Outcome: Not Progressing Condom cath due to incontinence.

## 2014-11-16 ENCOUNTER — Inpatient Hospital Stay (HOSPITAL_COMMUNITY): Payer: Self-pay | Admitting: Occupational Therapy

## 2014-11-16 ENCOUNTER — Inpatient Hospital Stay (HOSPITAL_COMMUNITY): Payer: Self-pay | Admitting: Rehabilitation

## 2014-11-16 ENCOUNTER — Inpatient Hospital Stay (HOSPITAL_COMMUNITY): Payer: Medicaid Other | Admitting: Speech Pathology

## 2014-11-16 LAB — ALPHA GALACTOSIDASE: Alpha galactosidase, serum: 0.162 U/L (ref 0.074–0.457)

## 2014-11-16 MED ORDER — TRAZODONE HCL 50 MG PO TABS
100.0000 mg | ORAL_TABLET | Freq: Every evening | ORAL | Status: DC | PRN
  Administered 2014-11-17 – 2014-12-02 (×12): 100 mg via ORAL
  Filled 2014-11-16 (×12): qty 2

## 2014-11-16 NOTE — Progress Notes (Signed)
Subjective/Complaints: Severe left neglect Patient states he feels refreshed after first shower in the hospital Review of systems unable to obtain secondary to cognitive status Objective: Vital Signs: Blood pressure 150/86, pulse 60, temperature 98.3 F (36.8 C), temperature source Oral, resp. rate 18, weight 91.4 kg (201 lb 8 oz), SpO2 100 %. No results found. Results for orders placed or performed during the hospital encounter of 11/05/14 (from the past 72 hour(s))  Culture, Urine     Status: None   Collection Time: 11/14/14 12:24 AM  Result Value Ref Range   Specimen Description URINE, CLEAN CATCH    Special Requests Normal    Colony Count NO GROWTH [facility]formed at [facility]     Culture NO GROWTH Performed at [facility]     Report Status 11/15/2014 FINAL   Urinalysis, Routine w reflex microscopic     Status: Abnormal   Collection Time: 11/14/14 12:24 AM  Result Value Ref Range   Color, Urine AMBER (A) YELLOW    Comment: BIOCHEMICALS MAY BE AFFECTED BY COLOR   APPearance CLEAR CLEAR   Specific Gravity, Urine 1.020 1.005 - 1.030   pH 5.0 5.0 - 8.0   Glucose, UA NEGATIVE NEGATIVE mg/dL   Hgb urine dipstick NEGATIVE NEGATIVE   Bilirubin Urine NEGATIVE NEGATIVE   Ketones, ur NEGATIVE NEGATIVE mg/dL   Protein, ur NEGATIVE NEGATIVE mg/dL   Urobilinogen, UA 0.2 0.0 - 1.0 mg/dL   Nitrite NEGATIVE NEGATIVE   Leukocytes, UA NEGATIVE NEGATIVE    Comment: MICROSCOPIC NOT DONE ON URINES WITH NEGATIVE PROTEIN, BLOOD, LEUKOCYTES, NITRITE, OR GLUCOSE <1000 mg/dL.  Clostridium Difficile by PCR     Status: None   Collection Time: 11/14/14  6:30 PM  Result Value Ref Range   C difficile by pcr NEGATIVE NEGATIVE     HEENT: normal and extensive gold caps Cardio: RRR and no murmurs Resp: CTA B/L and unlabored GI: BS positive and nontender nondistended Extremity:  No Edema Skin:   Intact Neuro: Lethargic, Flat, Abnormal Sensory reduced sensation in the left lower  extremity greater than left upper extremity to pinch, difficult to assess secondary to affect, Abnormal Motor 5/5 in the right deltoid, biceps, triceps, grip, hip flexor, knee extensor, ankle dorsiflexor and plantar flexor and Inattention 0/5 in the left deltoid biceps triceps, trace finger flexors, 0/5 and left hip flexor knee extensor and ankle dorsiflexor Has grasp with Left hand no release Musc/Skel:  Other no pain with range of motion of the left upper or left lower limb. Gen. no acute distress   Assessment/Plan: 1. Functional deficits secondary to right ACA infarct with left hemiparesis as well as cognitive deficits which require 3+ hours per day of interdisciplinary therapy in a comprehensive inpatient rehab setting. Physiatrist is providing close team supervision and 24 hour management of active medical problems listed below. Physiatrist and rehab team continue to assess barriers to discharge/monitor patient progress toward functional and medical goals.  FIM: FIM - Bathing Bathing Steps Patient Completed: Chest, Left Arm, Abdomen, Right upper leg, Left upper leg, Front perineal area Bathing: 3: Mod-Patient completes 5-7 [id number] 10 parts or 50-74%  FIM - Upper Body Dressing/Undressing Upper body dressing/undressing steps patient completed: Thread/unthread right sleeve of pullover shirt/dresss, Put head through opening of pull over shirt/dress Upper body dressing/undressing: 3: Mod-Patient completed 50-74% of tasks FIM - Lower Body Dressing/Undressing Lower body dressing/undressing: 1: Two helpers  FIM - Toileting Toileting steps completed by patient: Performs perineal hygiene Toileting: 1: Two helpers  FIM - Diplomatic Services operational officer Devices: Elevated toilet seat, Grab bars Toilet Transfers: 3-To toilet/BSC: Mod A (lift or lower assist), 2-From toilet/BSC: Max A (lift and lower assist), 1-Two helpers (2 helpers for transfer toilet to w/c)  FIM - Physiological scientist Devices: Arm rests Bed/Chair Transfer: 2: Supine > Sit: Max A (lifting assist/Pt. 25-49%), 1: Two helpers  FIM - Locomotion: Wheelchair Distance: 100 Locomotion: Wheelchair: 0: Activity did not occur FIM - Locomotion: Ambulation Locomotion: Ambulation Assistive Devices: Other (comment) ("three muskateer style") Ambulation/Gait Assistance: 1: +2 Total assist Locomotion: Ambulation: 0: Activity did not occur  Comprehension Comprehension Mode: Auditory Comprehension: 5-Follows basic conversation/direction: With extra time/assistive device  Expression Expression Mode: Verbal Expression: 5-Expresses basic needs/ideas: With no assist  Social Interaction Social Interaction: 4-Interacts appropriately 75 - 89% of the time - Needs redirection for appropriate language or to initiate interaction.  Problem Solving Problem Solving: 3-Solves basic 50 - 74% of the time/requires cueing 25 - 49% of the time  Memory Memory: 2-Recognizes or recalls 25 - 49% of the time/requires cueing 51 - 75% of the time  Medical Problem List and Plan: 1. Functional deficits secondary to right ACA infarct embolic secondary to unknown source 2.  DVT Prophylaxis/Anticoagulation: Subcutaneous Lovenox. Monitor platelet counts of any signs of bleeding. Venous Doppler studies negative 3. Pain Management: Tylenol as needed 4. Dysphagia/left side inattention. Mechanical soft diet. Follow-up speech therapy 5. Neuropsych: This patient is capable of making decisions on his own behalf.titrate ritalin for attn , concentration and initiation 6. Skin/Wound Care: nutrition/skin checks 7. Fluids/Electrolytes/Nutrition: encourage po. Checking labs 8. Hypertension with poor medical compliance. Norvasc 10 mg daily, Coreg 6.25 mg twice a day, lisinopril 40 mg daily.   Increase hydrochlorothiazide 25 mg per day, monitor potassium 9. Urine drug screen positive for marijuana. Counseling 10.  Hyperlipidemia. Lipitor   LOS (Days) 11 A FACE TO FACE EVALUATION WAS PERFORMED  KIRSTEINS,ANDREW E 11/16/2014, 9:08 AM

## 2014-11-16 NOTE — Progress Notes (Signed)
Physical Therapy Session Note  Patient Details  Name: [name] Short MRN: [id number] Date of Birth: 1965-08-19  Today's Date: 11/16/2014 PT Individual Time: 1030-1200 PT Individual Time Calculation (min): 90 min   Short Term Goals: Week 2:  PT Short Term Goal 1 (Week 2): Pt will perform all aspects of bed mobility with HOB flat and with rail at max A with 75% cues.  PT Short Term Goal 2 (Week 2): Pt will perform squat pivot transfers R or L with max A of single therapist with 75% cues.  PT Short Term Goal 3 (Week 2): Pt will perform dynamic sitting balance with single UE support x 3 mins in controlled environment at mod A level  PT Short Term Goal 4 (Week 2): Pt will perform sit<>stand with mod A and 50% cues.  PT Short Term Goal 5 (Week 2): Pt will perform gait x 25' w/ LRAD and max A with 75% cues and +2 for chair follow  Skilled Therapeutic Interventions/Progress Updates:   Pt received sitting in w/c in room, agreeable to therapy session.  Assisted to/from therapy gym via w/c at total A level for time management and energy conservation.  Once in therapy gym, worked in tall kneeling in order to address midline orientation, increased weight shift to the R to decrease pusher tendencies, WB through LLE/LUE, and sustained attention.  Performed task in tall kneeling with +2A with heavy facilitation for increased glute activation and upright trunk.  Pt continues to demonstrate decreased awareness and impulsivity with movement, however did better once demonstrated slower controlled movement.  Transitioned to SL propped on L elbow again to decrease pusher tendencies while working on WB through LUE and improving trunk shortening/lengthening with reaching tasks.  Assisted back into sitting and assisted with donning walk on AFO to LLE (only brace available) with L shoe cover for improved L foot clearance.  Assisted pt into hallway and ambulated x 40' via "three muskateer style" with PT under L axilla to  facilitate upright posture, increased glute and quad activation during L stance phase, assist to advance LLE and facilitation for weight shift to the R to better clear LLE.  Ended session with seated nustep task x 8 mins with BLEs only at level 2 resistance with [facility] assist with LLE to maintain adduction and full extension.  Transferred to w/c with +2A and assisted pt towards room. GF met pt and PT in hallway asking about grounds pass.  PA to write.  Educated on how to sign pt and in/out and also that they have to return prior to therapy session.  GF verbalized understanding.    Therapy Documentation Precautions:  Precautions Precautions: Fall Precaution Comments: Rt gaze preference, decreased attention to Lt Restrictions Weight Bearing Restrictions: No  Pain: Pt with no c/o pain during session   See FIM for current functional status  Therapy/Group: Individual Therapy  Denice Bors 11/16/2014, 11:07 AM

## 2014-11-16 NOTE — Plan of Care (Signed)
Problem: RH BOWEL ELIMINATION Goal: RH STG MANAGE BOWEL WITH ASSISTANCE STG Manage Bowel with Mod Assistance.  Outcome: Not Progressing Pt incontinent @[facility] 

## 2014-11-16 NOTE — Plan of Care (Signed)
Problem: RH SKIN INTEGRITY Goal: RH STG MAINTAIN SKIN INTEGRITY WITH ASSISTANCE STG Maintain Skin Integrity With Mod Assistance.  Outcome: Not Progressing Pt has dry scaly, skin. Non-maintained

## 2014-11-16 NOTE — Progress Notes (Signed)
Occupational Therapy Session Note  Patient Details  Name: [name] Short Sheppard MRN: [id number] Date of Birth: 1966-06-03  Today's Date: 11/16/2014 OT Individual Time: 1302-1402 OT Individual Time Calculation (min): 60 min    Skilled Therapeutic Interventions/Progress Updates:    Pt worked on Chief of Staffneuromuscular re-education during session.  He needed total assist for stand pivot transfer from the wheelchair to the mat.  Increased difficulty noted with advancing the RLE during transfer secondary to pushing as well as advancing the hemi paretic left side.  Once in sitting pt demonstrates severe lean/pull to the right and needs max assist to maintain midline orientation.  Pt with decreased motor planning in the left arm during functional use.  Pt with increased difficulty letting go of items when attempting grasp in the left hand.  During session had pt work on reaching for labeled bean bags with the LUE, placed on the bedside table.  He needed mod facilitation for reaching forward to grasp the bag and then to place it over to the left in the container.  He demonstrated moderate difficulty with letting go of the bags.  In addition, he demonstrated difficulty with maintaining selective attention to the task as each time someone would appear out of his peripheral vision on the right, he would turn his head.  Therapist put up curtains to help with decreasing distraction.  Pt also however internally distracted by back pain and requesting to lay down for a "couple of mins" during session.  Returned to room at end of session.  Pt transferred back to the bed with total assist stand pivot to the left side.  Pt unable to coordinate use of the RUE and RLE during wheelchair mobility on the way back to the room .    Therapy Documentation Precautions:  Precautions Precautions: Fall Precaution Comments: Rt gaze preference, decreased attention to Lt Restrictions Weight Bearing Restrictions: No  Pain: Pain Assessment Pain  Assessment: Faces Faces Pain Scale: Hurts little more Pain Type: Acute pain Pain Location: Back Pain Orientation: Right Pain Intervention(s): Repositioned;Medication (See eMAR) ADL: See FIM for current functional status  Therapy/Group: Individual Therapy  Darly Massi OTR/L 11/16/2014, 3:45 PM

## 2014-11-16 NOTE — Progress Notes (Signed)
Occupational Therapy Session Note  Patient Details  Name: [name] Short MRN: [id number] Date of Birth: 10/25/1965  Today's Date: 11/16/2014 OT Individual Time: 0800-0903 OT Individual Time Calculation (min): 63 min    Short Term Goals: Week 2:  OT Short Term Goal 1 (Week 2): Pt will complete transfer on/off toilet with mod assist of one caregiver OT Short Term Goal 2 (Week 2): Pt will complete bathing at sit > stand level with mod assist of 1 caregiver OT Short Term Goal 3 (Week 2): Pt will complete LB dressing with max assist OT Short Term Goal 4 (Week 2): Pt will complete UB dressing with min assist  Skilled Therapeutic Interventions/Progress Updates:    Engaged in ADL retraining with focus on functional transfers, sit <> stand, attention to Lt, and functional use of LUE during self-care tasks.  Pt in bed upon arrival, perseverating on c/o issues with tech over night requiring max cues to redirect pt to participate in treatment session.  Mod assist squat pivot bed > w/c to pt's Rt with pt requiring verbal cues for sequence and attention requiring cues to remain quiet during mobility.  Mod assist squat pivot w/c > tub bench in room shower with use of grab bar and to pt's Rt.  Again requiring cues to attend to bathing as pt still talking about night tech.  Mod assist in standing with blocking at Lt knee for stability while therapist washed buttocks, with cues for pt to focus on standing balance.  Max assist squat pivot to Lt out of shower with pt requiring multiple squat/scoots due to tight space.  Dressing completed at sit > stand level at sink with pt demonstrating recall of hemi-dressing technique and pulling pants up over Rt hip in standing with mod assist to maintain standing balance with blocking at Lt knee.  Pt left seated in w/c with quick release belt on and all needs in reach with MD present.  Therapy Documentation Precautions:  Precautions Precautions: Fall Precaution Comments: Rt  gaze preference, decreased attention to Lt Restrictions Weight Bearing Restrictions: No Pain:  Pt with no c/o pain  See FIM for current functional status  Therapy/Group: Individual Therapy  Rosalio LoudHOXIE, Desirey Keahey 11/16/2014, 10:06 AM

## 2014-11-16 NOTE — Progress Notes (Signed)
Speech Language Pathology Daily Session Note  Patient Details  Name: [name] Short MRN: [id number] Date of Birth: [birth year]  Today's Date: 11/16/2014 SLP Individual Time: 0930-1000 SLP Individual Time Calculation (min): 30 min  Short Term Goals: Week 2: SLP Short Term Goal 1 (Week 2): Pt will tolerate dys 3 textures and thin liquids with supervision cues for use of rate and portion control for three consecutive sessions prior to diet advancement.   SLP Short Term Goal 1 - Progress (Week 2): Progressing toward goal SLP Short Term Goal 2 (Week 2): Pt will sustain attention to a functional task for 5-10 minutes with mod A. SLP Short Term Goal 2 - Progress (Week 2): Progressing toward goal SLP Short Term Goal 3 (Week 2): Pt will attend to the L side during a basic functional task with min assist . SLP Short Term Goal 3 - Progress (Week 2): Progressing toward goal SLP Short Term Goal 4 (Week 2): Pt will identify 2 cognitive deficits and 2 physical deficits with mod A question cues. SLP Short Term Goal 4 - Progress (Week 2): Progressing toward goal SLP Short Term Goal 5 (Week 2): Pt will complete basic functional problem solving tasks with mod assist verbal cues.   SLP Short Term Goal 5 - Progress (Week 2): Progressing toward goal  Skilled Therapeutic Interventions: Skilled ST focused on dysphagia and cognitive goals. Upon arrival, pt was noted to be seated upright in wheelchair eating breakfast. No overt s/s aspiration observed or reported during the remainder of the meal. Pt was agreeable to therapy and completed simple maze completion task independently and accurately. Cue given x1 to not cross over his lines, and to stay within the confines of the maze.  Pt able to locate specific items in a visually busy picture with adequate attention to detail, and to the left.  No cues were necessary for pt to attend to each of these tasks, in quiet environment.   FIM:  Comprehension Comprehension Mode:  Auditory Comprehension: 5-Follows basic conversation/direction: With extra time/assistive device Expression Expression Mode: Verbal Expression: 5-Expresses basic needs/ideas: With no assist Social Interaction Social Interaction: 4-Interacts appropriately 75 - 89% of the time - Needs redirection for appropriate language or to initiate interaction. Problem Solving Problem Solving: 3-Solves basic 50 - 74% of the time/requires cueing 25 - 49% of the time Memory Memory: 2-Recognizes or recalls 25 - 49% of the time/requires cueing 51 - 75% of the time  Pain Pain Assessment Pain Assessment: No/denies pain  Therapy/Group: Individual Therapy   [name] Short, [name] Short, [name] Short  [name] Short, [name] Short 11/16/2014, 12:39 PM

## 2014-11-17 ENCOUNTER — Inpatient Hospital Stay (HOSPITAL_COMMUNITY): Payer: Self-pay | Admitting: Rehabilitation

## 2014-11-17 ENCOUNTER — Inpatient Hospital Stay (HOSPITAL_COMMUNITY): Payer: Medicaid Other | Admitting: Occupational Therapy

## 2014-11-17 ENCOUNTER — Inpatient Hospital Stay (HOSPITAL_COMMUNITY): Payer: Self-pay | Admitting: Speech Pathology

## 2014-11-17 NOTE — Progress Notes (Signed)
Physical Therapy Session Note  Patient Details  Name: [name] Short MRN: [id number] Date of Birth: Aug 22, 1965  Today's Date: 11/17/2014 PT Individual Time: 1400-1500 PT Individual Time Calculation (min): 60 min   Short Term Goals: Week 2:  PT Short Term Goal 1 (Week 2): Pt will perform all aspects of bed mobility with HOB flat and with rail at max A with 75% cues.  PT Short Term Goal 2 (Week 2): Pt will perform squat pivot transfers R or L with max A of single therapist with 75% cues.  PT Short Term Goal 3 (Week 2): Pt will perform dynamic sitting balance with single UE support x 3 mins in controlled environment at mod A level  PT Short Term Goal 4 (Week 2): Pt will perform sit<>stand with mod A and 50% cues.  PT Short Term Goal 5 (Week 2): Pt will perform gait x 25' w/ LRAD and max A with 75% cues and +2 for chair follow  Skilled Therapeutic Interventions/Progress Updates:   Pt received lying in bed, agreeable to therapy session but needing to void urine prior to getting OOB.  Pt successful in voiding in urinal, however needed total A from therapist to hold urinal for successful void.  Note that small amount of urine got on pants, therefore assisted with donning and doffing pants/new pants with use of bridging and rolling.  Performed bed mobility with mod A requiring increased time to process simple verbal commands with slight tactile cues for initiation.  Also requires mod A to elevate trunk into sitting.  Once at EOB, assisted with donning shoes.  Performed squat pivot transfer x 2 reps during session to the L at mod A level, however continues to require max A to the R due to pusher tendencies.  Daughter present during part of session.  Had very frank discussion with daughter regarding pts increased need for physical and cognitive assist when he leaves rehab and feeling that single person will not be able to care for him 24/7.  Daughter did not really respond but seemed to be taking information  in.  Will continue to discuss with CSW and family.  Remainder of skilled session focused on dynamic sitting and standing balance while participating in ball toss (hitting beach ball) with focus on increasing R weight shift, standing with use of Eva walker with focus on increasing R weight shift to advance and retro step LLE to prepare for gait and decrease pusher tendencies and also sitting balance working on upright posture, midline orientation, L trunk lengthening and R trunk shortening while reaching to touch object with LUE.  Requires max with +2 for safety with all standing task and min to initiate and follow through with sitting tasks.  Tolerated well and noted increased sweating at end of session.  Pt states he has worked hard during our time. Assisted pt back to nursing station with quick release belt donned and RN made aware.    Therapy Documentation Precautions:  Precautions Precautions: Fall Precaution Comments: Rt gaze preference, decreased attention to Lt Restrictions Weight Bearing Restrictions: No   Vital Signs: Therapy Vitals Temp: 99.8 F (37.7 C) Temp Source: Oral Pulse Rate: 78 Resp: 18 BP: 121/79 mmHg Patient Position (if appropriate): Lying Oxygen Therapy SpO2: 100 % O2 Device: Not Delivered Pain: Pt with no reports of pain during session.   See FIM for current functional status  Therapy/Group: Individual Therapy  Vista Deckarcell, [name] Short Ann 11/17/2014, 4:14 PM

## 2014-11-17 NOTE — Progress Notes (Signed)
Occupational Therapy Session Note  Patient Details  Name: [name] Short MRN: [id number] Date of Birth: 01-15-1966  Today's Date: 11/17/2014 OT Individual Time: 0830-1000 OT Individual Time Calculation (min): 90 min    Short Term Goals: Week 2:  OT Short Term Goal 1 (Week 2): Pt will complete transfer on/off toilet with mod assist of one caregiver OT Short Term Goal 2 (Week 2): Pt will complete bathing at sit > stand level with mod assist of 1 caregiver OT Short Term Goal 3 (Week 2): Pt will complete LB dressing with max assist OT Short Term Goal 4 (Week 2): Pt will complete UB dressing with min assist  Skilled Therapeutic Interventions/Progress Updates:    Engaged in ADL retraining with focus on functional transfers, functional use of LUE, sit <> stand, standing balance, and increased participation in self-care tasks.  Pt in bed upon arrival, performed bed mobility and supine to sit with cues for technique and to advance LLE off bed then min assist sidelying to sitting for trunk control.  Pt with good carryover of weight shifting technique for transfer to w/c.  Transfer w/c to toilet with mod assist with pt initiating without notice to therapist, requiring assist to control movement.  Educated pt on need to count to 3 to ensure both pt and therapist ready as his movement continues to be quick and impulsive at times.  +2 for hygiene with pt standing with min assist and then mod assist to maintain standing while 2nd person completed hygiene.  +2 squat pivot back to w/c to Lt with 2nd person only present for safety with no physical assist, max assist of therapist.  Bathing and dressing completed at sink with mod cues for initiation due to decreased sustained attention.  Hand over hand assist at LUE to incorporate LUE into bathing and applying deodorant.  Continues to demonstrate difficulty with releasing grasp.  Educated on visually attending to Lt hand to promote finger extension.  In therapy gym,  engaged in Connect 4 activity in standing with +2 for safety with therapist providing blocking at Lt knee and facilitation at trunk to promote upright standing and midline orientation while LUE in weight bearing on table.  Modified task to seated with focus on use of LUE with grasping pieces and passing across midline, incorporating shoulder and elbow movements as well as supination/pronation and grasp/release.  Pt more successful with grasp release in repetitive functional movement.    Therapy Documentation Precautions:  Precautions Precautions: Fall Precaution Comments: Rt gaze preference, decreased attention to Lt Restrictions Weight Bearing Restrictions: No General:   Vital Signs: Therapy Vitals Temp: 99.8 F (37.7 C) Temp Source: Oral Pulse Rate: 78 Resp: 18 BP: 121/79 mmHg Patient Position (if appropriate): Lying Oxygen Therapy SpO2: 100 % O2 Device: Not Delivered Pain:  Pt with no c/o pain  See FIM for current functional status  Therapy/Group: Individual Therapy  Rosalio LoudHOXIE, Nathaniel Yaden 11/17/2014, 3:27 PM

## 2014-11-17 NOTE — Progress Notes (Signed)
Subjective/Complaints: Patient attempted to void in urinal, no other complaints. Review of systems unable to obtain secondary to cognitive status Objective: Vital Signs: Blood pressure 129/81, pulse 68, temperature 98 F (36.7 C), temperature source Oral, resp. rate 18, weight 91.4 kg (201 lb 8 oz), SpO2 100 %. No results found. Results for orders placed or performed during the hospital encounter of 11/05/14 (from the past 72 hour(s))  Clostridium Difficile by PCR     Status: None   Collection Time: 11/14/14  6:30 PM  Result Value Ref Range   C difficile by pcr NEGATIVE NEGATIVE     HEENT: normal and extensive gold caps Cardio: RRR and no murmurs Resp: CTA B/L and unlabored GI: BS positive and nontender nondistended Extremity:  No Edema Skin:   Intact Neuro: Lethargic, Flat, Abnormal Sensory reduced sensation in the left lower extremity greater than left upper extremity to pinch, difficult to assess secondary to affect, Abnormal Motor 5/5 in the right deltoid, biceps, triceps, grip, hip flexor, knee extensor, ankle dorsiflexor and plantar flexor and Inattention 0/5 in the left deltoid, 2 minus biceps triceps,2 minus finger flexors, 0/5 and left hip flexor knee extensor and ankle dorsiflexor Has grasp with Left hand no release, poor awareness of left upper extremity movement Musc/Skel:  Other no pain with range of motion of the left upper or left lower limb. Gen. no acute distress   Assessment/Plan: 1. Functional deficits secondary to right ACA infarct with left hemiparesis as well as cognitive deficits which require 3+ hours per day of interdisciplinary therapy in a comprehensive inpatient rehab setting. Physiatrist is providing close team supervision and 24 hour management of active medical problems listed below. Physiatrist and rehab team continue to assess barriers to discharge/monitor patient progress toward functional and medical goals.  FIM: FIM - Bathing Bathing Steps  Patient Completed: Chest, Left Arm, Abdomen, Front perineal area, Right upper leg, Left upper leg Bathing: 3: Mod-Patient completes 5-7 [id number] 10 parts or 50-74%  FIM - Upper Body Dressing/Undressing Upper body dressing/undressing steps patient completed: Thread/unthread right sleeve of pullover shirt/dresss, Put head through opening of pull over shirt/dress Upper body dressing/undressing: 3: Mod-Patient completed 50-74% of tasks FIM - Lower Body Dressing/Undressing Lower body dressing/undressing: 1: Total-Patient completed less than 25% of tasks  FIM - Toileting Toileting steps completed by patient: Performs perineal hygiene Toileting: 1: Two helpers  FIM - Diplomatic Services operational officerToilet Transfers Toilet Transfers Assistive Devices: Elevated toilet seat, Grab bars Toilet Transfers: 3-To toilet/BSC: Mod A (lift or lower assist), 2-From toilet/BSC: Max A (lift and lower assist), 1-Two helpers (2 helpers for transfer toilet to w/c)  FIM - BankerBed/Chair Transfer Bed/Chair Transfer Assistive Devices: Arm rests Bed/Chair Transfer: 1: Chair or W/C > Bed: Total A (helper does all/Pt. < 25%)  FIM - Locomotion: Wheelchair Distance: 100 Locomotion: Wheelchair: 0: Activity did not occur FIM - Locomotion: Ambulation Locomotion: Ambulation Assistive Devices: Other (comment) ("three muskateer style") Ambulation/Gait Assistance: 1: +2 Total assist Locomotion: Ambulation: 1: Two helpers  Comprehension Comprehension Mode: Auditory Comprehension: 5-Follows basic conversation/direction: With no assist  Expression Expression Mode: Verbal Expression: 5-Expresses basic needs/ideas: With extra time/assistive device  Social Interaction Social Interaction: 4-Interacts appropriately 75 - 89% of the time - Needs redirection for appropriate language or to initiate interaction.  Problem Solving Problem Solving: 3-Solves basic 50 - 74% of the time/requires cueing 25 - 49% of the time  Memory Memory: 3-Recognizes or recalls 50 - 74%  of the time/requires cueing 25 - 49% of the time  Medical Problem List and Plan: 1. Functional deficits secondary to right ACA infarct embolic secondary to unknown source 2.  DVT Prophylaxis/Anticoagulation: Subcutaneous Lovenox. Monitor platelet counts of any signs of bleeding. Venous Doppler studies negative 3. Pain Management: Tylenol as needed 4. Dysphagia/left side inattention. Mechanical soft diet. Follow-up speech therapy 5. Neuropsych: This patient is capable of making decisions on his own behalf.titrate ritalin for attn , concentration and initiation 6. Skin/Wound Care: nutrition/skin checks 7. Fluids/Electrolytes/Nutrition: encourage po. Checking labs 8. Hypertension with poor medical compliance. Norvasc 10 mg daily, Coreg 6.25 mg twice a day, lisinopril 40 mg daily.   Increase hydrochlorothiazide 25 mg per day, monitor potassium 9. Urine drug screen positive for marijuana. Counseling 10. Hyperlipidemia. Lipitor   LOS (Days) 12 A FACE TO FACE EVALUATION WAS PERFORMED  KIRSTEINS,ANDREW E 11/17/2014, 9:25 AM

## 2014-11-17 NOTE — Progress Notes (Signed)
Speech Language Pathology Daily Session Note  Patient Details  Name: [name] Short MRN: [id number] Date of Birth: April 07, 1966  Today's Date: 11/17/2014 SLP Individual Time: 1103-1205 SLP Individual Time Calculation (min): 62 min  Short Term Goals: Week 2: SLP Short Term Goal 1 (Week 2): Pt will tolerate dys 3 textures and thin liquids with supervision cues for use of rate and portion control for three consecutive sessions prior to diet advancement.   SLP Short Term Goal 1 - Progress (Week 2): Progressing toward goal SLP Short Term Goal 2 (Week 2): Pt will sustain attention to a functional task for 5-10 minutes with mod A. SLP Short Term Goal 2 - Progress (Week 2): Progressing toward goal SLP Short Term Goal 3 (Week 2): Pt will attend to the Short side during a basic functional task with min assist . SLP Short Term Goal 3 - Progress (Week 2): Progressing toward goal SLP Short Term Goal 4 (Week 2): Pt will identify 2 cognitive deficits and 2 physical deficits with mod A question cues. SLP Short Term Goal 4 - Progress (Week 2): Progressing toward goal SLP Short Term Goal 5 (Week 2): Pt will complete basic functional problem solving tasks with mod assist verbal cues.   SLP Short Term Goal 5 - Progress (Week 2): Progressing toward goal  Skilled Therapeutic Interventions:  Pt was seen for skilled ST targeting cognitive goals.  Upon arrival, pt was reclined in bed, awake, alert, and agreeable to participate in ST with min encouragement.  Pt was noted with his hand around his urinal and reported that he was getting ready to urinate but had not initiated use of call bell.  Pt utilized call bell with min verbal and visual cues to convey needs to nursing.  Pt with urge but unable to void, max assist verbal cues for redirection.  SLP attempted to complete skilled observations during presentations of pt's currently prescribed diet; however, pt's daughter and granddaughter arrived during lunch and pt required  total assist to attend to meal.  SLP provided skilled education to pt's daughter about cuing pt for use of slow rate and portion control during meals due to impulsivity and decreased sustained attention.  Pt's daughter is now signed off to supervise during meals.  Nurse tech made aware.  Pt's daughter verbalized concerns about pt's significant other's ability to care for him and became tearful with SLP.  SLP reviewed and reinforced recommendations for extensive 24/7 supervision at discharge due to physical and cognitive deficits.  CSW made aware.  Continue per current plan of care.    FIM:  Comprehension Comprehension Mode: Auditory Comprehension: 4-Understands basic 75 - 89% of the time/requires cueing 10 - 24% of the time Expression Expression Mode: Verbal Expression: 5-Expresses basic 90% of the time/requires cueing < 10% of the time. Social Interaction Social Interaction: 4-Interacts appropriately 75 - 89% of the time - Needs redirection for appropriate language or to initiate interaction. Problem Solving Problem Solving: 3-Solves basic 50 - 74% of the time/requires cueing 25 - 49% of the time Memory Memory: 3-Recognizes or recalls 50 - 74% of the time/requires cueing 25 - 49% of the time FIM - Eating Eating Activity: 5: Supervision/cues  Pain Pain Assessment Pain Assessment: No/denies pain  Therapy/Group: Individual Therapy  [name] Short, [name] 11/17/2014, 4:15 PM

## 2014-11-18 ENCOUNTER — Ambulatory Visit (HOSPITAL_COMMUNITY): Payer: Self-pay | Admitting: Rehabilitation

## 2014-11-18 ENCOUNTER — Inpatient Hospital Stay (HOSPITAL_COMMUNITY): Payer: Medicaid Other | Admitting: Occupational Therapy

## 2014-11-18 ENCOUNTER — Inpatient Hospital Stay (HOSPITAL_COMMUNITY): Payer: Self-pay | Admitting: Speech Pathology

## 2014-11-18 LAB — URINALYSIS, ROUTINE W REFLEX MICROSCOPIC
Glucose, UA: NEGATIVE mg/dL
KETONES UR: NEGATIVE mg/dL
NITRITE: NEGATIVE
Protein, ur: NEGATIVE mg/dL
Specific Gravity, Urine: 1.018 (ref 1.005–1.030)
Urobilinogen, UA: 1 mg/dL (ref 0.0–1.0)
pH: 5 (ref 5.0–8.0)

## 2014-11-18 LAB — URINE MICROSCOPIC-ADD ON

## 2014-11-18 NOTE — Progress Notes (Signed)
Physical Therapy Session Note  Patient Details  Name: [name] Short Sheppard MRN: [id number] Date of Birth: [birth year]  Today's Date: 11/18/2014 PT Co-Treatment Time:  1430-1500 (whole session 1400-1500 w/ OT)  Short Term Goals: Week 2:  PT Short Term Goal 1 (Week 2): Pt will perform all aspects of bed mobility with HOB flat and with rail at max A with 75% cues.  PT Short Term Goal 2 (Week 2): Pt will perform squat pivot transfers R or L with max A of single therapist with 75% cues.  PT Short Term Goal 3 (Week 2): Pt will perform dynamic sitting balance with single UE support x 3 mins in controlled environment at mod A level  PT Short Term Goal 4 (Week 2): Pt will perform sit<>stand with mod A and 50% cues.  PT Short Term Goal 5 (Week 2): Pt will perform gait x 25' w/ LRAD and max A with 75% cues and +2 for chair follow  Skilled Therapeutic Interventions/Progress Updates:   Pt worked on Chief of Staffneuromuscular re-education during session with co-tx with OT. Worked on functional mobility with +2 assist (pt 35%). He was able to perform sit to stand with overall mod assist but then needs facilitation at the LLE for advancement as well as max facilitation for sustained left knee extension as well as L hip protraction. Transitioned from standing into quadriped with emphasis on sustained activation of the left UE and LE in weightbearing. Also provided manual assist at hips to maintain forward hip (as he continued to want to lower hip towards heel).  Had pt reaching for clothespins and placing them with the RUE while maintaining weightbearing over the LUE. Mod facilitation needed at the shoulder to maintain stability. Transitioned to tall kneeling as well with emphasis placed on reaching up and laterally with the RUE for sustained activation of the left hip. When pt is made to reach far laterally he can achieve sustained activation. Max demonstrational cueing needed to maintain sustained attention to all tasks in a  non-distracting environment. Transitioned to standing to work on this as well. Had pt standing and holding beachball up on the wall with the RUE above a given mark. Max demonstrational cueing to maintain sustained attention to the task. Total +2 (pt 50%) to maintain standing balance for 3-4 mins during task, with increased activation noted in RLE due to pushing and decreased ability to sustain due to pusher tendencies.  Ambulated to the wheelchair to finish up session w/ +2A for upright trunk, forward weight shift over LEs during stance, assist to advance and stabilize LLE. Pt placed in room with visitor present and safety belt in place.   Therapy Documentation Precautions:  Precautions Precautions: Fall Precaution Comments: Rt gaze preference, decreased attention to Lt Restrictions Weight Bearing Restrictions: No   Vital Signs: Therapy Vitals Temp: 100.1 F (37.8 C) (RN notified) Temp Source: Oral Pulse Rate: 90 BP: 114/82 mmHg Patient Position (if appropriate): Sitting Oxygen Therapy SpO2: 100 % O2 Device: Not Delivered Pain: Pain Assessment Pain Assessment: No/denies pain   Locomotion : Ambulation Ambulation/Gait Assistance: 1: +2 Total assist  See FIM for current functional status  Therapy/Group: Co-Treatment w/ OT  Vista DeckParcell, Giovanie Lefebre Ann 11/18/2014, 5:08 PM

## 2014-11-18 NOTE — Patient Care Conference (Signed)
Inpatient RehabilitationTeam Conference and Plan of Care Update Date: 11/18/2014   Time: 10;50 AM    Patient Name: [name] Short      Medical Record Number: [id number]  Date of Birth: Oct 05, 1965 Sex: Male         Room/Bed: 4W11C/4W11C-01 Payor Info: Payor: MEDICAID PENDING / Plan: MEDICAID PENDING / Product Type: *No Product type* /    Admitting Diagnosis: R ACA infarct   Admit Date/Time:  11/05/2014  4:33 PM Admission Comments: No comment available   Primary Diagnosis:  <principal problem not specified> Principal Problem: <principal problem not specified>  Patient Active Problem List   Diagnosis Date Noted  . Secondary cardiomyopathy 11/05/2014  . Malignant hypertension 11/05/2014  . Hyperlipidemia LDL goal <70 11/05/2014  . Cigarette smoker 11/05/2014  . Marijuana abuse 11/05/2014  . Hypokalemia 11/05/2014  . Left hemiparesis 11/05/2014  . Cognitive deficit, post-stroke 11/05/2014  . Left-sided weakness   . Acute right anterior cerebral artery (ACA) ischemic stroke 10/31/2014    Expected Discharge Date: Expected Discharge Date: 12/03/14  Team Members Present: Physician leading conference: Dr. Claudette Laws Social Worker Present: Dossie Der, LCSW Nurse Present: Carlean Purl, RN PT Present: Edman Circle, PT;Rodney Conard Novak, PT OT Present: Rosalio Loud, OT;Julia Saguier, Heath Lark, OT SLP Present: Jackalyn Lombard, SLP PPS Coordinator present : Tora Duck, RN, CRRN     Current Status/Progress Goal Weekly Team Focus  Medical   poor initiation  home d/c preferred but limited family support  stop ritalin and observe    Bowel/Bladder   incontinet at times. wears condom caths at night  continent of bowel and bladder with minimal assistance   timed toileting q3-4 hours   Swallow/Nutrition/ Hydration   Dys 3 and thin liquids, full supervision   supervision with least restrictive diet   continue diet toleration and trials of advanced consistencies     ADL's   mod-max assist with bathing and dressing, +2 with transfers for safety with pt able to complete at mod-max assist but requires +2 due to instability and inconsistency.  Pt with improved initiation and attention sporadically  min assist overall, mod assist toileting  transfers, standing balance, midline orientation in standing, LUE NMR   Mobility   Pt is max A for bed mobility, mod A squat pivot transfers to the L, max A to the R dueto pusher tendencies, +2 A for gait.  Continues to be extremely limited by poor attention, poor awareness, percetual deficits.   min to mod A overall (from w/c level, will likely downgrade ambulation and stair goal)  L NMR, transfers, decreasing pusher tendencies, midline orientation, gait, attention, pt/family education   Communication             Safety/Cognition/ Behavioral Observations  mod-max assist for basic cognition   min-mod for basic   continue addressing left attention, initiation, sustained attention, basic problem solving   Pain   no complaints of pain noted   Assess for pain and treat  Assess for pain prior to therapy   Skin   Dry skin  keep skin free of breakdown  Assess and monitor for breakdown/wound    Rehab Goals Patient on target to meet rehab goals: Yes *See Care Plan and progress notes for long and short-term goals.  Barriers to Discharge: see above heavy assist , Left neglect and poor initiation    Possible Resolutions to Barriers:  see above    Discharge Planning/Teaching Needs:  Girlfriend still planning to take pt home but  is concerned regaridng the amount of care he will require, daughter to assist also. SSD and Medicaid applicaitons pending      Team Discussion:  Min-mod level goals-currently plus 2 assist. Making slow progress-poor awareness.  Condom cath at night. UTI question will begin antibiotics. Pt will be difficult to care for by one person.  Revisions to Treatment Plan:  Ques if will become NH   Continued  Need for Acute Rehabilitation Level of Care: The patient requires daily medical management by a physician with specialized training in physical medicine and rehabilitation for the following conditions: Daily direction of a multidisciplinary physical rehabilitation program to ensure safe treatment while eliciting the highest outcome that is of practical value to the patient.: Yes Daily medical management of patient stability for increased activity during participation in an intensive rehabilitation regime.: Yes Daily analysis of laboratory values and/or radiology reports with any subsequent need for medication adjustment of medical intervention for : Neurological problems  Carter Kaman, Lemar LivingsRebecca G 11/18/2014, 4:04 PM

## 2014-11-18 NOTE — Progress Notes (Signed)
Occupational Therapy Session Note  Patient Details  Name: [name] Short MRN: [id number] Date of Birth: April 17, 1966  Today's Date: 11/18/2014 OT Individual Time: 0930-1030 and 1300-1400 OT Individual Time Calculation (min): 60 min and 60 min   Short Term Goals: Week 2:  OT Short Term Goal 1 (Week 2): Pt will complete transfer on/off toilet with mod assist of one caregiver OT Short Term Goal 2 (Week 2): Pt will complete bathing at sit > stand level with mod assist of 1 caregiver OT Short Term Goal 3 (Week 2): Pt will complete LB dressing with max assist OT Short Term Goal 4 (Week 2): Pt will complete UB dressing with min assist  Skilled Therapeutic Interventions/Progress Updates:    1) Engaged in ADL retraining with focus on functional transfers, sit <> stand, and increased participation in dressing tasks.  Pt in bed upon arrival, reports need for bedpan.  Encouraged pt to use [facility] with therapy since urgent.  Squat pivot transfer bed > BSC with mod assist and 2nd person stabilizing BSC.  Pt continent of loose bowel, noted blood when attempting to urinate with pt reporting pain.  RN notified.  Due to need to remain on toilet for prolonged BM, bathing completed seated on BSC.  Hand over hand with LUE to wash Rt arm with pt able to initiate movement with increased time, but difficulty with terminating task.  Pt's daughter, Elmarie Shiley, arrived during session. Discussed pt's current level of assist requiring +2 for most involved standing tasks and for safety due to pt's impulsivity and dense hemiplegia.  Pt transferred to recliner from bed with mod assist with carryover of technique and requiring physical assist to promote forward weight shift.  In recliner engaged in reaching activity with LUE to achieve cell phone (motivating item) and encouraged pt to use Lt hand to operate apps.  Left in recliner with family present  2) Engaged in skilled treatment session with focus on sit <> stand, standing balance,  weight shifting as needed for self-care tasks and to progress towards ambulation, and forced use of LUE.  Pt in bed upon arrival, reports concern with blood in urine.  Pt able to void in clean urinal and notified RN of clean sample for possible testing.  Squat pivot bed > w/c with mod assist with over the back technique to promote forward weight shift.  In therapy gym, engaged in sit > stand and standing activity with pt able to complete sit > stand with min-mod assist with therapist providing stability at Lt knee in standing and support at pelvis and trunk to promote upright standing balance.  Therapist repositioned to under pt's Lt arm to provide increased support, while pt completed weight shifting to step forward and backward with RLE with 2nd person on pt's Rt to provide assist with weight shift.  Engaged in reaching activity in standing to challenge lateral weight shift to obtain items with RUE and place them across midline.  In sitting, forced use of LUE with focus on grasp and release as pt able to grasp items with increased time for initiation approx 50% of time and then with increased difficulty with releasing item.  Attempted adding pressure, stretch, and distraction with no consistent result to elicit release.    Therapy Documentation Precautions:  Precautions Precautions: Fall Precaution Comments: Rt gaze preference, decreased attention to Lt Restrictions Weight Bearing Restrictions: No General:   Vital Signs: Therapy Vitals Temp: 100.1 F (37.8 C) (RN notified) Temp Source: Oral Pulse Rate: 90 BP: 114/82  mmHg Patient Position (if appropriate): Sitting Oxygen Therapy SpO2: 100 % O2 Device: Not Delivered Pain: Pain Assessment Pain Assessment: No/denies pain  See FIM for current functional status  Therapy/Group: Individual Therapy  Rosalio LoudHOXIE, Cashay Manganelli 11/18/2014, 3:34 PM

## 2014-11-18 NOTE — Progress Notes (Signed)
Speech Language Pathology Daily Session Note  Patient Details  Name: [name] Short MRN: [id number] Date of Birth: March 07, 1966  Today's Date: 11/18/2014 SLP Individual Time:  0900- 0930 SLP Treatment Time: 30 min     Short Term Goals: Week 2: SLP Short Term Goal 1 (Week 2): Pt will tolerate dys 3 textures and thin liquids with supervision cues for use of rate and portion control for three consecutive sessions prior to diet advancement.   SLP Short Term Goal 1 - Progress (Week 2): Progressing toward goal SLP Short Term Goal 2 (Week 2): Pt will sustain attention to a functional task for 5-10 minutes with mod A. SLP Short Term Goal 2 - Progress (Week 2): Progressing toward goal SLP Short Term Goal 3 (Week 2): Pt will attend to the L side during a basic functional task with min assist . SLP Short Term Goal 3 - Progress (Week 2): Progressing toward goal SLP Short Term Goal 4 (Week 2): Pt will identify 2 cognitive deficits and 2 physical deficits with mod A question cues. SLP Short Term Goal 4 - Progress (Week 2): Progressing toward goal SLP Short Term Goal 5 (Week 2): Pt will complete basic functional problem solving tasks with mod assist verbal cues.   SLP Short Term Goal 5 - Progress (Week 2): Progressing toward goal  Skilled Therapeutic Interventions:  Pt was seen for skilled ST targeting goals for dysphagia.  Upon arrival, pt was asleep in bed but easily awakened to voice and light touch.  Pt had not eaten breakfast prior to arrival and reported that he was hungry.  Therefore, SLP facilitated the session with skilled observations with presentations of his currently prescribed diet.  Pt consumed dys 3 textures and thin liquids with min verbal cues for rate and portion control.  Pt self initiated use of liquid wash to clear residual solids from the oral cavity post swallow.  No overt s/s of aspiration were observed with solids or liquids.  Pt continues to demonstrate decreased sustained attention to  task and required min verbal cues for redirection to meal in a quiet environment.  Continue per current plan of care.    FIM:  Comprehension Comprehension Mode: Auditory Comprehension: 4-Understands basic 75 - 89% of the time/requires cueing 10 - 24% of the time Expression Expression Mode: Verbal Expression: 5-Expresses basic 90% of the time/requires cueing < 10% of the time. Social Interaction Social Interaction: 4-Interacts appropriately 75 - 89% of the time - Needs redirection for appropriate language or to initiate interaction. Problem Solving Problem Solving: 3-Solves basic 50 - 74% of the time/requires cueing 25 - 49% of the time Memory Memory: 3-Recognizes or recalls 50 - 74% of the time/requires cueing 25 - 49% of the time FIM - Eating Eating Activity: 5: Needs verbal cues/supervision  Pain Pain Assessment Pain Assessment: No/denies pain  Therapy/Group: Individual Therapy  Sheniah Supak, [name] 11/18/2014, 11:48 AM

## 2014-11-18 NOTE — Progress Notes (Signed)
Subjective/Complaints: I feel bad, had a fever last night Review of systems unable to obtain secondary to cognitive status Objective: Vital Signs: Blood pressure 120/72, pulse 73, temperature 99.2 F (37.3 C), temperature source Oral, resp. rate 18, weight 91.4 kg (201 lb 8 oz), SpO2 98 %. No results found. No results found for this or any previous visit (from the past 72 hour(s)).   HEENT: normal and extensive gold caps Cardio: RRR and no murmurs Resp: CTA B/L and unlabored GI: BS positive and nontender nondistended Extremity:  No Edema Skin:   Intact Neuro: Lethargic, Flat, Abnormal Sensory reduced sensation in the left lower extremity greater than left upper extremity to pinch, difficult to assess secondary to affect, Abnormal Motor 5/5 in the right deltoid, biceps, triceps, grip, hip flexor, knee extensor, ankle dorsiflexor and plantar flexor and Inattention 0/5 in the left deltoid, 2 minus biceps triceps,2 minus finger flexors, 0/5 and left hip flexor knee extensor and ankle dorsiflexor Has grasp with Left hand no release, poor awareness of left upper extremity movement Musc/Skel:  Other no pain with range of motion of the left upper or left lower limb. Gen. no acute distress   Assessment/Plan: 1. Functional deficits secondary to right ACA infarct with left hemiparesis as well as cognitive deficits which require 3+ hours per day of interdisciplinary therapy in a comprehensive inpatient rehab setting. Physiatrist is providing close team supervision and 24 hour management of active medical problems listed below. Physiatrist and rehab team continue to assess barriers to discharge/monitor patient progress toward functional and medical goals.  FIM: FIM - Bathing Bathing Steps Patient Completed: Chest, Left Arm, Abdomen, Front perineal area, Right upper leg, Left upper leg Bathing: 3: Mod-Patient completes 5-7 [id number] parts or 50-74%  FIM - Upper Body Dressing/Undressing Upper body  dressing/undressing steps patient completed: Thread/unthread right sleeve of pullover shirt/dresss, Thread/unthread left sleeve of pullover shirt/dress, Put head through opening of pull over shirt/dress Upper body dressing/undressing: 4: Min-Patient completed 75 plus % of tasks FIM - Lower Body Dressing/Undressing Lower body dressing/undressing: 1: Total-Patient completed less than 25% of tasks  FIM - Toileting Toileting steps completed by patient: Performs perineal hygiene Toileting: 1: Two helpers  FIM - Diplomatic Services operational officerToilet Transfers Toilet Transfers Assistive Devices: Elevated toilet seat, Grab bars Toilet Transfers: 3-To toilet/BSC: Mod A (lift or lower assist), 2-From toilet/BSC: Max A (lift and lower assist), 1-Two helpers (2 helpers toilet to w/c only)  FIM - BankerBed/Chair Transfer Bed/Chair Transfer Assistive Devices: Arm rests Bed/Chair Transfer: 3: Supine > Sit: Mod A (lifting assist/Pt. 50-74%/lift 2 legs, 3: Bed > Chair or W/C: Mod A (lift or lower assist)  FIM - Locomotion: Wheelchair Distance: 100 Locomotion: Wheelchair: 0: Activity did not occur FIM - Locomotion: Ambulation Locomotion: Ambulation Assistive Devices: Other (comment) ("three muskateer style") Ambulation/Gait Assistance: 1: +2 Total assist Locomotion: Ambulation: 0: Activity did not occur  Comprehension Comprehension Mode: Auditory Comprehension: 4-Understands basic 75 - 89% of the time/requires cueing 10 - 24% of the time  Expression Expression Mode: Verbal Expression: 5-Expresses basic 90% of the time/requires cueing < 10% of the time.  Social Interaction Social Interaction: 4-Interacts appropriately 75 - 89% of the time - Needs redirection for appropriate language or to initiate interaction.  Problem Solving Problem Solving: 3-Solves basic 50 - 74% of the time/requires cueing 25 - 49% of the time  Memory Memory: 3-Recognizes or recalls 50 - 74% of the time/requires cueing 25 - 49% of the time  Medical Problem  List and Plan:  1. Functional deficits secondary to right ACA infarct embolic secondary to unknown source 2.  DVT Prophylaxis/Anticoagulation: Subcutaneous Lovenox. Monitor platelet counts of any signs of bleeding. Venous Doppler studies negative 3. Pain Management: Tylenol as needed 4. Dysphagia/left side inattention. Mechanical soft diet. Follow-up speech therapy 5. Neuropsych: This patient is capable of making decisions on his own behalf.titrate ritalin for attn , concentration and initiation 6. Skin/Wound Care: nutrition/skin checks 7. Fluids/Electrolytes/Nutrition: encourage po. Checking labs 8. Hypertension with poor medical compliance. Norvasc 10 mg daily, Coreg 6.25 mg twice a day, lisinopril 40 mg daily.   Increase hydrochlorothiazide 25 mg per day, monitor potassium 9. Urine drug screen positive for marijuana. Counseling 10. Hyperlipidemia. Lipitor 11.Low grade temp check UA LOS (Days) 13 A FACE TO FACE EVALUATION WAS PERFORMED  KIRSTEINS,ANDREW E 11/18/2014, 9:19 AM

## 2014-11-18 NOTE — Progress Notes (Signed)
Occupational Therapy Session Note  Patient Details  Name: [name] Short MRN: [id number] Date of Birth: 23-Nov-1965  Today's Date: 11/18/2014 OT Individual Time: 1300-1400 OT Individual Time Calculation (min): 60 min   Skilled Therapeutic Interventions/Progress Updates:    Pt worked on Chief of Staffneuromuscular re-education during session with co-tx with PT.  Worked on functional mobility with +2 assist (pt 35%).  He was able to perform sit to stand with overall mod assist but then needs facilitation at the LLE for advancement as well as max facilitation for sustained left knee extension.  Transitioned from standing into quadriped with emphasis on sustained activation of the left UE in weightbearing.  Had pt reaching for clothespins and placing them with the RUE while maintaining weightbearing over the LUE.  Mod facilitation needed at the shoulder to maintain stability.  Transitioned to tall kneeling as well with emphasis placed on reaching up and laterally with the RUE for sustained activation of the left hip.  When pt is made to reach far laterally he can achieve sustained activation.  Max demonstrational cueing needed to maintain sustained attention to all tasks in a non-distracting environment.  Transitioned to standing to work on this as well.  Had pt standing and holding beachball up on the wall with the RUE above a given mark.  Max demonstrational cueing to maintain sustained attention to the task.  Total +2 (pt 50%) to maintain standing balance for 3-4 mins during task.  Ambulated to the wheelchair to finish up session.  Pt placed in room with visitor present and safety belt in place.   Therapy Documentation Precautions:  Precautions Precautions: Fall Precaution Comments: Rt gaze preference, decreased attention to Lt Restrictions Weight Bearing Restrictions: No  Pain: Pain Assessment Pain Assessment: No/denies pain ADL: See FIM for current functional status  Therapy/Group: Co-tx  Bernarda Erck  OTR/L 11/18/2014, 4:06 PM

## 2014-11-19 ENCOUNTER — Inpatient Hospital Stay (HOSPITAL_COMMUNITY): Payer: Self-pay | Admitting: Speech Pathology

## 2014-11-19 ENCOUNTER — Inpatient Hospital Stay (HOSPITAL_COMMUNITY): Payer: Self-pay | Admitting: Occupational Therapy

## 2014-11-19 ENCOUNTER — Inpatient Hospital Stay (HOSPITAL_COMMUNITY): Payer: Self-pay | Admitting: Rehabilitation

## 2014-11-19 DIAGNOSIS — N390 Urinary tract infection, site not specified: Secondary | ICD-10-CM
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
## 2014-11-19 NOTE — Progress Notes (Signed)
Occupational Therapy Session Note  Patient Details  Name: Hassel NethCarl Sheppard MRN: [id number] Date of Birth: 22-Aug-1965  Today's Date: 11/19/2014 OT Individual Time: 1430-1500 OT Individual Time Calculation (min): 30 min    Skilled Therapeutic Interventions/Progress Updates:    1:1 Focus on transfer training with maintaining a forward posture, initiation of movement in sync with caregiver, upright trunk posture with promotion of right trunk extension during functional reaching tasks, dynamic sitting balance with reach to the left, sustained attention in a minimal distracting environment, and w/c adjustments. Applied apple board underneath w/c cushion for more support. Transferred back to w/c with mod A with 3 scoots with extra time and max cuing for positioning and timing.  Therapy Documentation Precautions:  Precautions Precautions: Fall Precaution Comments: Rt gaze preference, decreased attention to Lt Restrictions Weight Bearing Restrictions: No Pain: Discomfort on right side - assisted with positioning  See FIM for current functional status  Therapy/Group: Individual Therapy  Roney MansSmith, Kasaundra Fahrney [facility] Hospital[facility]sey 11/19/2014, 3:43 PM

## 2014-11-19 NOTE — Progress Notes (Signed)
Physical Therapy Weekly Progress Note  Patient Details  Name: [name] Short MRN: [id number] Date of Birth: 05-20-1966  Beginning of progress report period: November 13, 2014 End of progress report period: November 19, 2014  Today's Date: 11/19/2014 PT Individual Time: 1300- 1400 Duration: 60 mins.      Patient has met 4 of 5 short term goals.  Pt making very steady progress during this reporting period.  Has decreased burden of care to single person max A squat pivot transfers and is working on more dynamic tasks in standing.  Continues to be very limited by decreased focused and sustained attention, decreased intellectual awareness, perceptual deficits, and decreased functional strength in LUE/LE.  Continue to feel he will need high level of care at time of D/C and am unsure if he will be able to return home.   Patient continues to demonstrate the following deficits: decreased balance, decreased awareness, decreased attention, decreased functional use of LUE/LE and therefore will continue to benefit from skilled PT intervention to enhance overall performance with activity tolerance, balance, postural control, ability to compensate for deficits, functional use of  left upper extremity and left lower extremity, attention, awareness, coordination and knowledge of precautions.  Patient progressing toward long term goals..  Continue plan of care.  PT Short Term Goals Week 2:  PT Short Term Goal 1 (Week 2): Pt will perform all aspects of bed mobility with HOB flat and with rail at max A with 75% cues.  PT Short Term Goal 1 - Progress (Week 2): Met PT Short Term Goal 2 (Week 2): Pt will perform squat pivot transfers R or L with max A of single therapist with 75% cues.  PT Short Term Goal 2 - Progress (Week 2): Met PT Short Term Goal 3 (Week 2): Pt will perform dynamic sitting balance with single UE support x 3 mins in controlled environment at mod A level  PT Short Term Goal 3 - Progress (Week 2):  Met PT Short Term Goal 4 (Week 2): Pt will perform sit<>stand with mod A and 50% cues.  PT Short Term Goal 4 - Progress (Week 2): Met PT Short Term Goal 5 (Week 2): Pt will perform gait x 25' w/ LRAD and max A with 75% cues and +2 for chair follow PT Short Term Goal 5 - Progress (Week 2): Progressing toward goal Week 3:  PT Short Term Goal 1 (Week 3): Pt will perform all aspects of bed mobility with HOB flat and with rail at mod A with 50% cues.  PT Short Term Goal 2 (Week 3): Pt will perform squat pivot transfers R or L with mod A of single therapist with 50% cues.  PT Short Term Goal 3 (Week 3): Pt will perform dynamic standing balance tasks without UE support at mod A and 50% cues.  PT Short Term Goal 4 (Week 3): Pt will perform gait x 25' w/ LRAD and max A with 75% cues and +2 for chair follow  Skilled Therapeutic Interventions/Progress Updates:   Pt received sitting in w/c in room, agreeable to therapy session.  GF and daughter present during session to observe therapy session.  Note pt to be much more quiet today during session and much less verbal.  Unsure if this is due to medication or current family situation.  Assisted pt to/from via w/c at total A level for time management and energy conservation.  Once in therapy gym, worked on dynamic standing for NMR through LLE and trunk as  well as sustained attention with task of reaching for numbers/letters in sequenced pattern.  Progressed to working in sitting with feet unsupported while performing reaching task with RUE then with LUE to address pusher tendencies, WB through L hip, trunk shortening and lengthening as well as increased R trunk rotation.  Requires max A for standing task with heavy facilitation for midline orientation and increased R weight shift and upright posture.  Transferred w/c<>mat via squat pivot to the L with mod A and to the R with min A and cues for increased forward weight shift.  Pt left in w/c with all needs in reach and  family present to speak with CSW.    Therapy Documentation Precautions:  Precautions Precautions: Fall Precaution Comments: Rt gaze preference, decreased attention to Lt Restrictions Weight Bearing Restrictions: No   Vital Signs: Therapy Vitals Temp: 98.8 F (37.1 C) Temp Source: Oral Pulse Rate: 82 Resp: 17 BP: 114/62 mmHg Patient Position (if appropriate): Lying Oxygen Therapy SpO2: 100 % O2 Device: Not Delivered Pain:Pt with no reports of pain during session.      See FIM for current functional status  Therapy/Group: Individual Therapy  Denice Bors 11/19/2014, 7:58 AM

## 2014-11-19 NOTE — Progress Notes (Signed)
Occupational Therapy Weekly Progress Note  Patient Details  Name: [name] Short MRN: [id number] Date of Birth: 08-Mar-1966  Beginning of progress report period: November 12, 2014 End of progress report period: November 19, 2014  Today's Date: 11/19/2014 OT Individual Time:0930-1030 and 1130-1200 OT Individual Time Calculation (min): 60 min and 30 min    Patient has met 2 of 4 short term goals.  Pt is making steady progress towards goals.  He is more alert and engaged in therapy sessions requiring less time to initiate tasks, however does continue to require increased time for processing of directions and initiation especially when not motivated.  Pt has progressed to mod assist of one caregiver with squat pivot transfers when transferring to pt's Rt side as he can assist with RUE, continues to require max- +2 with transfers to Lt mostly for safety due to inconsistency.  Pt is demonstrating increased motor control with transfers and sit <> stand, however still requiring multimodal cues.  Noted increased functional movement in LUE with ability to grasp/release and pronate/supinate, however requires assist to elicit shoulder movement.  Pt is easily distracted in minimally distracting environment, requires constant cues for redirection and attention to task.  Patient continues to demonstrate the following deficits: Lt hemiparesis, Rt gaze preference, LUE impaired sensation and proprioception, impaired initiation, processing speed, and sustained attention, decreased sitting and standing balance and therefore will continue to benefit from skilled OT intervention to enhance overall performance with BADL and Reduce care partner burden.  Patient progressing toward long term goals..  Continue plan of care.  OT Short Term Goals Week 2:  OT Short Term Goal 1 (Week 2): Pt will complete transfer on/off toilet with mod assist of one caregiver OT Short Term Goal 1 - Progress (Week 2): Progressing toward goal OT Short  Term Goal 2 (Week 2): Pt will complete bathing at sit > stand level with mod assist of 1 caregiver OT Short Term Goal 2 - Progress (Week 2): Met OT Short Term Goal 3 (Week 2): Pt will complete LB dressing with max assist OT Short Term Goal 3 - Progress (Week 2): Progressing toward goal OT Short Term Goal 4 (Week 2): Pt will complete UB dressing with min assist OT Short Term Goal 4 - Progress (Week 2): Met Week 3:  OT Short Term Goal 1 (Week 3): Pt will complete transfer on/off toilet with mod assist of one caregiver OT Short Term Goal 2 (Week 3): Pt will complete LB dressing with max assist OT Short Term Goal 3 (Week 3): Pt will complete bathing at sit > stand level with min assist of 1 caregiver OT Short Term Goal 4 (Week 3): Pt will complete shower transfer with mod assist of one caregiver  Skilled Therapeutic Interventions/Progress Updates:    1) Engaged in ADL retraining with focus on sitting balance, functional use of LUE, transfers, and sit <> stand.  Pt in bed upon arrival, reports pain in Rt ribs and back and not wanting to get OOB.  Upon further investigation pt reports sitting up in w/c till late yesterday and not wanting to get back into w/c as it is not comfortable to him.  Discussed modifying chair back during next session and plan to complete bathing and dressing from EOB.  Placed basin to pt's Lt to promote weight shifting and reaching to Lt.  Hand over hand assist with LUE to incorporate it into bathing.  Pt with increased ability to release grasp during session, however still requiring increased time  and cues to attend to Lt hand.  Squat pivot bed > w/c with mod assist with pt demonstrating carryover of technique and increased initiation when he counts to 3.  2) Skilled treatment with focus on transfers, sit <> stand, and attention to task.  Pt in bed upon arrival.  Modified rigid back of w/c to decrease anterior pelvic tilt and decrease rigidity of stays in back as pt reporting pain  around Rt ribs from sitting in w/c.  Engaged in seated task to allow pt to test new seating to assess if it decreases back pain.  Engaged in [place] activity in moderately distracting environment with pt required to scan to Lt to locate matching item in field of 7 on both cards.  Pt required increased time and mod verbal cues for attention to task.  Returned to room and encouraged to remain seated up in w/c to further assess seating and eat lunch.  Daughter arrived at end of session and voiced concerns about pt's mood and ability to focus on therapy sessions based on current state and situation going on with significant other.  Therapy Documentation Precautions:  Precautions Precautions: Fall Precaution Comments: Rt gaze preference, decreased attention to Lt Restrictions Weight Bearing Restrictions: No Pain:  Pt with c/o pain in Rt ribs, repositioned and modified back on w/c.  See FIM for current functional status  Therapy/Group: Individual Therapy  Simonne Come 11/19/2014, 12:30 PM

## 2014-11-19 NOTE — Progress Notes (Signed)
Subjective/Complaints: No burning with urination Review of systems unable to obtain secondary to cognitive status Objective: Vital Signs: Blood pressure 114/62, pulse 82, temperature 98.8 F (37.1 C), temperature source Oral, resp. rate 17, weight 91.4 kg (201 lb 8 oz), SpO2 100 %. No results found. Results for orders placed or performed during the hospital encounter of 11/05/14 (from the past 72 hour(s))  Urinalysis, Routine w reflex microscopic     Status: Abnormal   Collection Time: 11/18/14  1:34 PM  Result Value Ref Range   Color, Urine AMBER (A) YELLOW    Comment: BIOCHEMICALS MAY BE AFFECTED BY COLOR   APPearance CLOUDY (A) CLEAR   Specific Gravity, Urine 1.018 1.005 - 1.030   pH 5.0 5.0 - 8.0   Glucose, UA NEGATIVE NEGATIVE mg/dL   Hgb urine dipstick LARGE (A) NEGATIVE   Bilirubin Urine SMALL (A) NEGATIVE   Ketones, ur NEGATIVE NEGATIVE mg/dL   Protein, ur NEGATIVE NEGATIVE mg/dL   Urobilinogen, UA 1.0 0.0 - 1.0 mg/dL   Nitrite NEGATIVE NEGATIVE   Leukocytes, UA MODERATE (A) NEGATIVE  Urine microscopic-add on     Status: Abnormal   Collection Time: 11/18/14  1:34 PM  Result Value Ref Range   Squamous Epithelial / LPF RARE RARE   WBC, UA 21-50 <3 WBC/hpf   RBC / HPF TOO NUMEROUS TO COUNT <3 RBC/hpf   Bacteria, UA MANY (A) RARE   Casts HYALINE CASTS (A) NEGATIVE    Comment: GRANULAR CAST     HEENT: normal and extensive gold caps Cardio: RRR and no murmurs Resp: CTA B/L and unlabored GI: BS positive and nontender nondistended Extremity:  No Edema Skin:   Intact Neuro: Lethargic, Flat, Abnormal Sensory reduced sensation in the left lower extremity greater than left upper extremity to pinch, difficult to assess secondary to affect, Abnormal Motor 5/5 in the right deltoid, biceps, triceps, grip, hip flexor, knee extensor, ankle dorsiflexor and plantar flexor and Inattention 0/5 in the left deltoid, 2 minus biceps triceps,2 minus finger flexors, 0/5 and left hip flexor  knee extensor and ankle dorsiflexor Has grasp with Left hand no release, poor awareness of left upper extremity movement Musc/Skel:  Other no pain with range of motion of the left upper or left lower limb. Gen. no acute distress   Assessment/Plan: 1. Functional deficits secondary to right ACA infarct with left hemiparesis as well as cognitive deficits which require 3+ hours per day of interdisciplinary therapy in a comprehensive inpatient rehab setting. Physiatrist is providing close team supervision and 24 hour management of active medical problems listed below. Physiatrist and rehab team continue to assess barriers to discharge/monitor patient progress toward functional and medical goals.  FIM: FIM - Bathing Bathing Steps Patient Completed: Chest, Left Arm, Abdomen, Front perineal area, Right upper leg, Left upper leg Bathing: 3: Mod-Patient completes 5-7 24f 10 parts or 50-74%  FIM - Upper Body Dressing/Undressing Upper body dressing/undressing steps patient completed: Thread/unthread right sleeve of pullover shirt/dresss, Put head through opening of pull over shirt/dress Upper body dressing/undressing: 3: Mod-Patient completed 50-74% of tasks FIM - Lower Body Dressing/Undressing Lower body dressing/undressing: 1: Total-Patient completed less than 25% of tasks  FIM - Toileting Toileting steps completed by patient: Performs perineal hygiene Toileting: 1: Two helpers  FIM - Diplomatic Services operational officer Devices: Psychiatrist Transfers: 3-To toilet/BSC: Mod A (lift or lower assist), 2-From toilet/BSC: Max A (lift and lower assist), 1-Two helpers (+2 for safety to stabilize [facility])  FIM - Bed/Chair  Transport plannerTransfer Bed/Chair Transfer Assistive Devices: Arm rests Bed/Chair Transfer: 3: Supine > Sit: Mod A (lifting assist/Pt. 50-74%/lift 2 legs, 3: Bed > Chair or W/C: Mod A (lift or lower assist), 2: Chair or W/C > Bed: Max A (lift and lower assist)  FIM - Locomotion:  Wheelchair Distance: 100 Locomotion: Wheelchair: 0: Activity did not occur FIM - Locomotion: Ambulation Locomotion: Ambulation Assistive Devices: Other (comment) ("three muskateer style) Ambulation/Gait Assistance: 1: +2 Total assist Locomotion: Ambulation: 1: Two helpers  Comprehension Comprehension Mode: Auditory Comprehension: 4-Understands basic 75 - 89% of the time/requires cueing 10 - 24% of the time  Expression Expression Mode: Verbal Expression: 5-Expresses basic 90% of the time/requires cueing < 10% of the time.  Social Interaction Social Interaction: 4-Interacts appropriately 75 - 89% of the time - Needs redirection for appropriate language or to initiate interaction.  Problem Solving Problem Solving: 3-Solves basic 50 - 74% of the time/requires cueing 25 - 49% of the time  Memory Memory: 3-Recognizes or recalls 50 - 74% of the time/requires cueing 25 - 49% of the time  Medical Problem List and Plan: 1. Functional deficits secondary to right ACA infarct embolic secondary to unknown source 2.  DVT Prophylaxis/Anticoagulation: Subcutaneous Lovenox. Monitor platelet counts of any signs of bleeding. Venous Doppler studies negative 3. Pain Management: Tylenol as needed 4. Dysphagia/left side inattention. Mechanical soft diet. Follow-up speech therapy 5. Neuropsych: This patient is capable of making decisions on his own behalf.titrate ritalin for attn , concentration and initiation 6. Skin/Wound Care: nutrition/skin checks 7. Fluids/Electrolytes/Nutrition: encourage po. Checking labs 8. Hypertension with poor medical compliance. Norvasc 10 mg daily, Coreg 6.25 mg twice a day, lisinopril 40 mg daily.   Increase hydrochlorothiazide 25 mg per day, monitor potassium 9. Urine drug screen positive for marijuana. Counseling 10. Hyperlipidemia. Lipitor 11.Low grade temp positive UA, empiric abx pnd Cx LOS (Days) 14 A FACE TO FACE EVALUATION WAS PERFORMED  KIRSTEINS,ANDREW  E 11/19/2014, 9:15 AM

## 2014-11-19 NOTE — Progress Notes (Signed)
Speech Language Pathology Weekly Progress and Session Note  Patient Details  Name: [name] Short MRN: [id number] Date of Birth: 1966/06/06  Beginning of progress report period: November 12, 2014 End of progress report period: November 19, 2014  Today's Date: 11/19/2014 SLP Individual Time: 1505-1530 SLP Individual Time Calculation (min): 25 min  Short Term Goals: Week 2: SLP Short Term Goal 1 (Week 2): Pt will tolerate dys 3 textures and thin liquids with supervision cues for use of rate and portion control for three consecutive sessions prior to diet advancement.   SLP Short Term Goal 1 - Progress (Week 2): Progressing toward goal SLP Short Term Goal 2 (Week 2): Pt will sustain attention to a functional task for 5-10 minutes with mod A. SLP Short Term Goal 2 - Progress (Week 2): Met SLP Short Term Goal 3 (Week 2): Pt will attend to the L side during a basic functional task with min assist . SLP Short Term Goal 3 - Progress (Week 2): Met SLP Short Term Goal 4 (Week 2): Pt will identify 2 cognitive deficits and 2 physical deficits with mod A question cues. SLP Short Term Goal 4 - Progress (Week 2): Progressing toward goal SLP Short Term Goal 5 (Week 2): Pt will complete basic functional problem solving tasks with mod assist verbal cues.   SLP Short Term Goal 5 - Progress (Week 2): Met    New Short Term Goals: Week 3: SLP Short Term Goal 1 (Week 3): Pt will tolerate dys 3 textures and thin liquids with supervision cues for use of rate and portion control for three consecutive sessions prior to diet advancement.   SLP Short Term Goal 2 (Week 3): Pt will sustain attention to a functional task for 5-10 minutes with min A verbal and tactile cues. SLP Short Term Goal 3 (Week 3): Pt will attend to the L side of the environment during a basic functional task with supervision. SLP Short Term Goal 4 (Week 3): Pt will identify 2 cognitive deficits and 2 physical deficits with mod A question cues. SLP  Short Term Goal 5 (Week 3): Pt will complete basic functional problem solving tasks with min assist verbal and visual cues.    Weekly Progress Updates:  Pt made slow, limited functional gains this reporting period and has met 4 out of 5 short term goals.  Pt currently requires mod assist multimodal cues for basic familiar cognitive tasks due to left inattention, decreased initiation, decreased functional problem solving, decreased recall of new information, and decreased sustained attention.  Pt is consuming dys 3 textures and thin liquids with min assist verbal cues for use of rate and portion control to manage left sided pocketing and anterior loss of boluses.  Pt would continue to benefit from skilled ST while inpatient in order to maximize functional independence and reduce burden of care prior to discharge.  Anticipate that pt will need extensive 24/7 supervision and ST follow up at next level of care.  Pt and family education is ongoing.     Intensity: Minumum of 1-2 x/day, 30 to 90 minutes Frequency: 3 to 5 out of 7 days Duration/Length of Stay: 20-25 days Treatment/Interventions: Cognitive remediation/compensation;Cueing hierarchy;Dysphagia/aspiration precaution training;Internal/external aids;Functional tasks;Environmental controls;Multimodal communication approach;Therapeutic Exercise;Patient/family education;Therapeutic Activities;Speech/Language facilitation   Daily Session  Skilled Therapeutic Interventions: Pt was seen for skilled ST targeting cognitive goals.  Upon arrival, pt was seated upright in wheelchair, awake, alert, and agreeable to participate in ST with max encouragement.  SLP facilitated the session with  a basic board game activity targeting visual scanning to the left and functional problem solving.  Pt planned and executed a problem solving strategy during the abovementioned task with max faded to mod assist verbal and visual cues.  Additionally, pt attended to games pieces  on the left side with min assist verbal and visual cues.  Pt is making slow progress towards goals.  Goals updated to reflect current progress and plan of care.          FIM:  Comprehension Comprehension Mode: Auditory Comprehension: 4-Understands basic 75 - 89% of the time/requires cueing 10 - 24% of the time Expression Expression Mode: Verbal Expression: 4-Expresses basic 75 - 89% of the time/requires cueing 10 - 24% of the time. Needs helper to occlude trach/needs to repeat words. Social Interaction Social Interaction: 4-Interacts appropriately 75 - 89% of the time - Needs redirection for appropriate language or to initiate interaction. Problem Solving Problem Solving: 3-Solves basic 50 - 74% of the time/requires cueing 25 - 49% of the time Memory Memory: 3-Recognizes or recalls 50 - 74% of the time/requires cueing 25 - 49% of the time General    Pain Pain Assessment Pain Assessment: No/denies pain  Therapy/Group: Individual Therapy  Brewer Hitchman, Selinda Orion 11/19/2014, 5:10 PM

## 2014-11-20 ENCOUNTER — Inpatient Hospital Stay (HOSPITAL_COMMUNITY): Payer: Medicaid Other | Admitting: Occupational Therapy

## 2014-11-20 ENCOUNTER — Inpatient Hospital Stay (HOSPITAL_COMMUNITY): Payer: Medicaid Other | Admitting: Physical Therapy

## 2014-11-20 ENCOUNTER — Inpatient Hospital Stay (HOSPITAL_COMMUNITY): Payer: Medicaid Other | Admitting: Speech Pathology

## 2014-11-20 ENCOUNTER — Inpatient Hospital Stay (HOSPITAL_COMMUNITY): Payer: Self-pay | Admitting: Occupational Therapy

## 2014-11-20 DIAGNOSIS — N3000 Acute cystitis without hematuria: Secondary | ICD-10-CM

## 2014-11-20 LAB — GLUCOSE, CAPILLARY: GLUCOSE-CAPILLARY: 99 mg/dL (ref 70–99)

## 2014-11-20 NOTE — Progress Notes (Signed)
Physical Therapy Session Note  Patient Details  Name: [name] Short MRN: [id number] Date of Birth: Jul 22, 1966  Today's Date: 11/20/2014 PT Individual Time: 1500-1600 PT Individual Time Calculation (min): 60 min   Short Term Goals: Week 1:  PT Short Term Goal 1 (Week 1): Pt will perform rolling R with bed rail max A and 100% cues. PT Short Term Goal 1 - Progress (Week 1): Met PT Short Term Goal 2 (Week 1): Pt will perform rolling L with bed rail mod A and 75% cues.  PT Short Term Goal 2 - Progress (Week 1): Met PT Short Term Goal 3 (Week 1): Pt will perform supine to sit total A of single therapist with 100% cues and HOB flat using rail. PT Short Term Goal 3 - Progress (Week 1): Progressing toward goal PT Short Term Goal 4 (Week 1): Pt will perform sit to supine total A of 1 with 100% and HOB flat. PT Short Term Goal 4 - Progress (Week 1): Met PT Short Term Goal 5 (Week 1): Pt will perform dynamic sitting balance with distractions for 2 minutes with bilat LE support, RUE support requiring max A. PT Short Term Goal 5 - Progress (Week 1): Met Week 2:  PT Short Term Goal 1 (Week 2): Pt will perform all aspects of bed mobility with HOB flat and with rail at max A with 75% cues.  PT Short Term Goal 1 - Progress (Week 2): Met PT Short Term Goal 2 (Week 2): Pt will perform squat pivot transfers R or L with max A of single therapist with 75% cues.  PT Short Term Goal 2 - Progress (Week 2): Met PT Short Term Goal 3 (Week 2): Pt will perform dynamic sitting balance with single UE support x 3 mins in controlled environment at mod A level  PT Short Term Goal 3 - Progress (Week 2): Met PT Short Term Goal 4 (Week 2): Pt will perform sit<>stand with mod A and 50% cues.  PT Short Term Goal 4 - Progress (Week 2): Met PT Short Term Goal 5 (Week 2): Pt will perform gait x 25' w/ LRAD and max A with 75% cues and +2 for chair follow PT Short Term Goal 5 - Progress (Week 2): Progressing toward goal  Skilled  Therapeutic Interventions/Progress Updates:   Pt received in bed; pt less engaged this pm and worried about being left in the w/c due to discomfort.  Assured pt he would be returned to another chair of bed at end of session. Pt reporting a need to urinate and agreeable to try in standing.  Transferred to EOB with mod A and assisted with donning shoes EOB.  Transferred bed > w/c squat pivot with max A and max-total verbal cues for weight shifting and sequencing.  Performed sit > stand at sink and maintained standing with mod-max A with pt maintaining UE contact with sink while therapist positioned, held and removed urinal.  Back in w/c pt performed w/c mobility training to focus on attention, attention to L environment and sequencing; performed w/c mobility with R hemi technique x 100' with max verbal cues for initiation, sequencing and attention.  Pt requesting to work on LE strength instead of gait today.  Transferred to Nustep with mod A and set up for NMR; see below.  Returned to w/c mod-max A and back to room.  Transferred w/c >bed and sit > supine mod-max A.  Pt left with all items with in reach.  Therapy Documentation Precautions:  Precautions Precautions: Fall Precaution Comments: Rt gaze preference, decreased attention to Lt Restrictions Weight Bearing Restrictions: No Vital Signs: Therapy Vitals Temp: 98.9 F (37.2 C) Temp Source: Oral Pulse Rate: 73 Resp: 16 BP: 104/69 mmHg Patient Position (if appropriate): Lying Oxygen Therapy SpO2: 97 % O2 Device: Not Delivered Pain: Pain Assessment Pain Assessment: No/denies pain Locomotion : Wheelchair Mobility Distance: 100  Other Treatments: Treatments Neuromuscular Facilitation: Left;Upper Extremity;Lower Extremity;Forced use;Activity to increase coordination;Activity to increase motor control;Activity to increase timing and sequencing;Activity to increase grading;Activity to increase sustained activation on Nustep with focus  initially on LE coordination and extension activation (no UE assist) progressing to all 4 extremities >> bilat LE and LUE only for continued focus on attention to LUE and LLE, initiation/activation and coordination.  Pt only able to attend to task for 10-30 seconds at a time.    See FIM for current functional status  Therapy/Group: Individual Therapy  [name] Short [facility] 11/20/2014, 5:14 PM

## 2014-11-20 NOTE — Progress Notes (Signed)
Occupational Therapy Session Note  Patient Details  Name: [name] Short MRN: [id number] Date of Birth: 09-26-1965  Today's Date: 11/20/2014 OT Individual Time: 1400-1445 OT Individual Time Calculation (min): 45 min    Short Term Goals: Week 1:  OT Short Term Goal 1 (Week 1): Pt will complete bathing with max assist of 1 caregiver at bed level to EOB OT Short Term Goal 1 - Progress (Week 1): Met OT Short Term Goal 2 (Week 1): Pt will complete UB dressing with mod assist OT Short Term Goal 2 - Progress (Week 1): Met OT Short Term Goal 3 (Week 1): Pt will maintain static sititng balance at EOB for 1 min in preparation for self-care task OT Short Term Goal 3 - Progress (Week 1): Met OT Short Term Goal 4 (Week 1): Pt will complete toilet transfer to drop arm BSC with max assist  OT Short Term Goal 4 - Progress (Week 1): Partly met Week 2:  OT Short Term Goal 1 (Week 2): Pt will complete transfer on/off toilet with mod assist of one caregiver OT Short Term Goal 1 - Progress (Week 2): Progressing toward goal OT Short Term Goal 2 (Week 2): Pt will complete bathing at sit > stand level with mod assist of 1 caregiver OT Short Term Goal 2 - Progress (Week 2): Met OT Short Term Goal 3 (Week 2): Pt will complete LB dressing with max assist OT Short Term Goal 3 - Progress (Week 2): Progressing toward goal OT Short Term Goal 4 (Week 2): Pt will complete UB dressing with min assist OT Short Term Goal 4 - Progress (Week 2): Met  Skilled Therapeutic Interventions/Progress Updates:    Pt lying in bed upon OT arrival.  GF present.  She reported he just got back in bed due to back pain and getting agitated.   Pt agreed to work on LUE in supine.  Pt. Demonstrated better movements and control with LUE.  He extended fingers but had difficulty with individual finger to thumb movements.  He demonstrated decreased initiation and processing when giving him movements to perform.  Did ball throwing for visual pursuits  and coordination.  Pt. Left in bed at end with all needs in reach.    Therapy Documentation Precautions:  Precautions Precautions: Fall Precaution Comments: Rt gaze preference, decreased attention to Lt Restrictions Weight Bearing Restrictions: No      Pain: Pain Assessment Pain Assessment: 0-10 (Simultaneous filing. User may not have seen previous data.) Pain Score: 7  Pain Type: Chronic pain Pain Location: Back Pain Orientation: Right;Left Pain Intervention(s): RN made aware       See FIM for current functional status  Therapy/Group: Individual Therapy  Lisa Roca 11/20/2014, 6:36 PM

## 2014-11-20 NOTE — Progress Notes (Signed)
Occupational Therapy Session Note  Patient Details  Name: [name] Short MRN: [id number] Date of Birth: 04-12-1966  Today's Date: 11/20/2014 OT Individual Time: 0930-1030 OT Individual Time Calculation (min): 60 min    Short Term Goals: Week 3:  OT Short Term Goal 1 (Week 3): Pt will complete transfer on/off toilet with mod assist of one caregiver OT Short Term Goal 2 (Week 3): Pt will complete LB dressing with max assist OT Short Term Goal 3 (Week 3): Pt will complete bathing at sit > stand level with min assist of 1 caregiver OT Short Term Goal 4 (Week 3): Pt will complete shower transfer with mod assist of one caregiver  Skilled Therapeutic Interventions/Progress Updates:    1:1 Pt in bed when arrived and reported he was not hurting. Pt came to EOB with mod A; for assistance with his LEs and extra time for trunk control to come into upright sitting position. Used the STEDY to come into standing with left UE able to hold onto bar 75% of time with min A. Transitioned into the shower. Sat on tub bench facing outwards. Pt bathed at min A with total A for cuing for washing all body parts thoroughly.  Mod cues to upright sitting posture at midline. Pt able to wash right UE with left with setup and min A for guidance of hand but [facility] to [facility]. Transitioned out of shower and into regular pink chair in room for dressing. Focus on functional use of left UE with mod A for use of grooming tools and as an active A with clothing management. Able to thread left LE with it cross over right knee with A to hold in place. (brief, pants and shoe in this position). In standing at the sink; once achieved midline and upright posture able to pull up pants with extra time with mod A with left knee support for balance. Transitioned into w/c with regular cushion with thought that pt may be resisting the support that a more "supportive" seating system provides resulting in more discomfort in trunk.    Therapy  Documentation Precautions:  Precautions Precautions: Fall Precaution Comments: Rt gaze preference, decreased attention to Lt Restrictions Weight Bearing Restrictions: No Pain: No c/o pain in session  See FIM for current functional status  Therapy/Group: Individual Therapy  Roney MansSmith, Lynessa Almanzar [facility] [place] 11/20/2014, 12:08 PM

## 2014-11-20 NOTE — Progress Notes (Signed)
Speech Language Pathology Daily Session Note  Patient Details  Name: [name] Short MRN: [id number] Date of Birth: 01-18-1966  Today's Date: 11/20/2014 SLP Individual Time: 1100-1130 SLP Individual Time Calculation (min): 30 min  Short Term Goals: Week 3: SLP Short Term Goal 1 (Week 3): Pt will tolerate dys 3 textures and thin liquids with supervision cues for use of rate and portion control for three consecutive sessions prior to diet advancement.   SLP Short Term Goal 2 (Week 3): Pt will sustain attention to a functional task for 5-10 minutes with min A verbal and tactile cues. SLP Short Term Goal 3 (Week 3): Pt will attend to the L side of the environment during a basic functional task with supervision. SLP Short Term Goal 4 (Week 3): Pt will identify 2 cognitive deficits and 2 physical deficits with mod A question cues. SLP Short Term Goal 5 (Week 3): Pt will complete basic functional problem solving tasks with min assist verbal and visual cues.    Skilled Therapeutic Interventions: Skilled ST session focused on cognitive goals. Patient responded appropriately to 90% of SLP's questions when given time to process and respond. After clinician brought patient to speech therapy treatment room, he stated that he needed assistance with the bathroom. Clinician brought patient back to his room. SLP set up task of brushing teeth. Patient completed with minimal verbal and tactile cues for attention. Patient appeared to be mildly perseverative with brushing teeth, but with clinician prompts, he progressed along and completed task. Patient c/o pain of back, saying that his wheelchair hurts him after "15 minutes" of sitting in it. (apparently this is a complaint he has had and staff already knows). Patient responded accurately to clinician's questions regarding recent events and biographical information, but patient seemed stuck on talking about the pain from sitting in the chair and needing a chair "built for  me". Continue with plan of care.   FIM:  Comprehension Comprehension Mode: Auditory Comprehension: 4-Understands basic 75 - 89% of the time/requires cueing 10 - 24% of the time Expression Expression Mode: Verbal Expression: 4-Expresses basic 75 - 89% of the time/requires cueing 10 - 24% of the time. Needs helper to occlude trach/needs to repeat words. Social Interaction Social Interaction: 4-Interacts appropriately 75 - 89% of the time - Needs redirection for appropriate language or to initiate interaction. Problem Solving Problem Solving: 3-Solves basic 50 - 74% of the time/requires cueing 25 - 49% of the time Memory Memory: 3-Recognizes or recalls 50 - 74% of the time/requires cueing 25 - 49% of the time  Pain Pain Assessment Pain Assessment: 0-10 (Simultaneous filing. User may not have seen previous data.) Pain Score: 7  Pain Type: Chronic pain Pain Location: Back Pain Orientation: Right;Left Pain Intervention(s): RN made aware  Therapy/Group: Individual Therapy  Pablo Lawrencereston, Rohan Juenger [name] Short 11/20/2014, 5:23 PM  Angela NevinJohn T. Revonda Menter, MA, CCC-SLP 11/20/2014 5:23 PM

## 2014-11-20 NOTE — Progress Notes (Signed)
Subjective/Complaints: Eating well, no c/os Review of systems unable to obtain secondary to cognitive status Objective: Vital Signs: Blood pressure 113/73, pulse 64, temperature 97.4 F (36.3 C), temperature source Oral, resp. rate 16, weight 91.4 kg (201 lb 8 oz), SpO2 99 %. No results found. Results for orders placed or performed during the hospital encounter of 11/05/14 (from the past 72 hour(s))  Urinalysis, Routine w reflex microscopic     Status: Abnormal   Collection Time: 11/18/14  1:34 PM  Result Value Ref Range   Color, Urine AMBER (A) YELLOW    Comment: BIOCHEMICALS MAY BE AFFECTED BY COLOR   APPearance CLOUDY (A) CLEAR   Specific Gravity, Urine 1.018 1.005 - 1.030   pH 5.0 5.0 - 8.0   Glucose, UA NEGATIVE NEGATIVE mg/dL   Hgb urine dipstick LARGE (A) NEGATIVE   Bilirubin Urine SMALL (A) NEGATIVE   Ketones, ur NEGATIVE NEGATIVE mg/dL   Protein, ur NEGATIVE NEGATIVE mg/dL   Urobilinogen, UA 1.0 0.0 - 1.0 mg/dL   Nitrite NEGATIVE NEGATIVE   Leukocytes, UA MODERATE (A) NEGATIVE  Culture, Urine     Status: None (Preliminary result)   Collection Time: 11/18/14  1:34 PM  Result Value Ref Range   Specimen Description URINE, RANDOM    Special Requests NONE    Colony Count      >=100,000 COLONIES/ML [facility]    Culture      GRAM NEGATIVE RODS [facility]formed at [facility]    Report Status PENDING   Urine microscopic-add on     Status: Abnormal   Collection Time: 11/18/14  1:34 PM  Result Value Ref Range   Squamous Epithelial / LPF RARE RARE   WBC, UA 21-50 <3 WBC/hpf   RBC / HPF TOO NUMEROUS TO COUNT <3 RBC/hpf   Bacteria, UA MANY (A) RARE   Casts HYALINE CASTS (A) NEGATIVE    Comment: GRANULAR CAST     HEENT: normal and extensive gold caps Cardio: RRR and no murmurs Resp: CTA B/L and unlabored GI: BS positive and nontender nondistended Extremity:  No Edema Skin:   Intact Neuro: Lethargic, Flat, Abnormal Sensory reduced  sensation in the left lower extremity greater than left upper extremity to pinch, difficult to assess secondary to affect, Abnormal Motor 5/5 in the right deltoid, biceps, triceps, grip, hip flexor, knee extensor, ankle dorsiflexor and plantar flexor and Inattention 0/5 in the left deltoid, 2 minus biceps triceps,2 minus finger flexors, 0/5 and left hip flexor knee extensor and ankle dorsiflexor   Gen. no acute distress   Assessment/Plan: 1. Functional deficits secondary to right ACA infarct with left hemiparesis as well as cognitive deficits which require 3+ hours per day of interdisciplinary therapy in a comprehensive inpatient rehab setting. Physiatrist is providing close team supervision and 24 hour management of active medical problems listed below. Physiatrist and rehab team continue to assess barriers to discharge/monitor patient progress toward functional and medical goals.  FIM: FIM - Bathing Bathing Steps Patient Completed: Chest, Left Arm, Abdomen, Front perineal area, Right upper leg, Left upper leg Bathing: 3: Mod-Patient completes 5-7 [id number] 10 parts or 50-74%  FIM - Upper Body Dressing/Undressing Upper body dressing/undressing steps patient completed: Thread/unthread right sleeve of pullover shirt/dresss, Put head through opening of pull over shirt/dress Upper body dressing/undressing: 3: Mod-Patient completed 50-74% of tasks FIM - Lower Body Dressing/Undressing Lower body dressing/undressing: 1: Total-Patient completed less than 25% of tasks  FIM - Toileting Toileting steps completed by patient:  Performs perineal hygiene Toileting: 1: Two helpers  FIM - Diplomatic Services operational officer Devices: Psychiatrist Transfers: 3-To toilet/BSC: Mod A (lift or lower assist), 2-From toilet/BSC: Max A (lift and lower assist), 1-Two helpers (+2 for safety to stabilize [facility])  FIM - Bed/Chair Transfer Bed/Chair Transfer Assistive Devices: Arm rests Bed/Chair Transfer:  3: Supine > Sit: Mod A (lifting assist/Pt. 50-74%/lift 2 legs, 3: Bed > Chair or W/C: Mod A (lift or lower assist)  FIM - Locomotion: Wheelchair Distance: 100 Locomotion: Wheelchair: 0: Activity did not occur FIM - Locomotion: Ambulation Locomotion: Ambulation Assistive Devices: Other (comment) ("three muskateer style) Ambulation/Gait Assistance: 1: +2 Total assist Locomotion: Ambulation: 1: Two helpers  Comprehension Comprehension Mode: Auditory Comprehension: 4-Understands basic 75 - 89% of the time/requires cueing 10 - 24% of the time  Expression Expression Mode: Verbal Expression: 4-Expresses basic 75 - 89% of the time/requires cueing 10 - 24% of the time. Needs helper to occlude trach/needs to repeat words.  Social Interaction Social Interaction: 4-Interacts appropriately 75 - 89% of the time - Needs redirection for appropriate language or to initiate interaction.  Problem Solving Problem Solving: 3-Solves basic 50 - 74% of the time/requires cueing 25 - 49% of the time  Memory Memory: 3-Recognizes or recalls 50 - 74% of the time/requires cueing 25 - 49% of the time  Medical Problem List and Plan: 1. Functional deficits secondary to right ACA infarct embolic secondary to unknown source 2.  DVT Prophylaxis/Anticoagulation: Subcutaneous Lovenox. Monitor platelet counts of any signs of bleeding. Venous Doppler studies negative 3. Pain Management: Tylenol as needed 4. Dysphagia/left side inattention. Mechanical soft diet. Follow-up speech therapy 5. Neuropsych: This patient is capable of making decisions on his own behalf.titrate ritalin for attn , concentration and initiation 6. Skin/Wound Care: nutrition/skin checks 7. Fluids/Electrolytes/Nutrition: encourage po. Checking labs 8. Hypertension with poor medical compliance. Norvasc 10 mg daily, Coreg 6.25 mg twice a day, lisinopril 40 mg daily.   Increase hydrochlorothiazide 25 mg per day, monitor potassium 9. Urine drug screen  positive for marijuana. Counseling 10. Hyperlipidemia. Lipitor 11.Low grade temp positive UA,>100K GNR  empiric abx pnd Cx LOS (Days) 15 A FACE TO FACE EVALUATION WAS PERFORMED  KIRSTEINS,ANDREW E 11/20/2014, 8:43 AM

## 2014-11-21 ENCOUNTER — Inpatient Hospital Stay (HOSPITAL_COMMUNITY): Payer: Self-pay | Admitting: Occupational Therapy

## 2014-11-21 DIAGNOSIS — N3001 Acute cystitis with hematuria: Secondary | ICD-10-CM

## 2014-11-21 DIAGNOSIS — N319 Neuromuscular dysfunction of bladder, unspecified: Secondary | ICD-10-CM

## 2014-11-21 LAB — URINE CULTURE

## 2014-11-21 MED ORDER — SODIUM CHLORIDE 0.45 % IV SOLN
INTRAVENOUS | Status: DC
  Administered 2014-11-21 – 2014-11-22 (×2): via INTRAVENOUS

## 2014-11-21 MED ORDER — CEPHALEXIN 250 MG PO CAPS
250.0000 mg | ORAL_CAPSULE | Freq: Three times a day (TID) | ORAL | Status: DC
  Administered 2014-11-21 – 2014-11-28 (×20): 250 mg via ORAL
  Filled 2014-11-21 (×27): qty 1

## 2014-11-21 NOTE — Progress Notes (Signed)
Family was concerns and feel that the patient looks different than yesterday. Explained that the patient was seen by Dr. Doroteo BradfordKirstein this morning and is aware of the current status of the patient.. Family still wants to speak to MD because they feel that the patient is having another stroke. Called [facility] for further evaluation as per family request.  Family was able to  spoke Dr. Doroteo BradfordKirstein. Report given to next shift nurse.

## 2014-11-21 NOTE — Progress Notes (Signed)
Subjective/Complaints: Eating well, no c/os Review of systems unable to obtain secondary to cognitive status Objective: Vital Signs: Blood pressure 115/67, pulse 56, temperature 98.7 F (37.1 C), temperature source Oral, resp. rate 17, weight 91.4 kg (201 lb 8 oz), SpO2 100 %. No results found. Results for orders placed or performed during the hospital encounter of 11/05/14 (from the past 72 hour(s))  Urinalysis, Routine w reflex microscopic     Status: Abnormal   Collection Time: 11/18/14  1:34 PM  Result Value Ref Range   Color, Urine AMBER (A) YELLOW    Comment: BIOCHEMICALS MAY BE AFFECTED BY COLOR   APPearance CLOUDY (A) CLEAR   Specific Gravity, Urine 1.018 1.005 - 1.030   pH 5.0 5.0 - 8.0   Glucose, UA NEGATIVE NEGATIVE mg/dL   Hgb urine dipstick LARGE (A) NEGATIVE   Bilirubin Urine SMALL (A) NEGATIVE   Ketones, ur NEGATIVE NEGATIVE mg/dL   Protein, ur NEGATIVE NEGATIVE mg/dL   Urobilinogen, UA 1.0 0.0 - 1.0 mg/dL   Nitrite NEGATIVE NEGATIVE   Leukocytes, UA MODERATE (A) NEGATIVE  Culture, Urine     Status: None (Preliminary result)   Collection Time: 11/18/14  1:34 PM  Result Value Ref Range   Specimen Description URINE, RANDOM    Special Requests NONE    Colony Count      >=100,000 COLONIES/ML Performed at [facility]    Culture      GRAM NEGATIVE RODS Performed at [facility]    Report Status PENDING   Urine microscopic-add on     Status: Abnormal   Collection Time: 11/18/14  1:34 PM  Result Value Ref Range   Squamous Epithelial / LPF RARE RARE   WBC, UA 21-50 <3 WBC/hpf   RBC / HPF TOO NUMEROUS TO COUNT <3 RBC/hpf   Bacteria, UA MANY (A) RARE   Casts HYALINE CASTS (A) NEGATIVE    Comment: GRANULAR CAST  Glucose, capillary     Status: None   Collection Time: 11/20/14  9:34 PM  Result Value Ref Range   Glucose-Capillary 99 70 - 99 mg/dL   Comment 1 Notify RN      HEENT: normal and extensive gold caps Cardio: RRR and no  murmurs Resp: CTA B/L and unlabored GI: BS positive and nontender nondistended Extremity:  No Edema Skin:   Intact Neuro: Lethargic, Flat, Abnormal  Delayed responses Sensory reduced sensation in the left lower extremity greater than left upper extremity to pinch, difficult to assess secondary to affect, Abnormal Motor 5/5 in the right deltoid, biceps, triceps, grip, hip flexor, knee extensor, ankle dorsiflexor and plantar flexor and Inattention 0/5 in the left deltoid, 2 minus biceps triceps,2 minus finger flexors, 0/5 and left hip flexor knee extensor and ankle dorsiflexor Gen. no acute distress   Assessment/Plan: 1. Functional deficits secondary to right ACA infarct with left hemiparesis as well as cognitive deficits which require 3+ hours per day of interdisciplinary therapy in a comprehensive inpatient rehab setting. Physiatrist is providing close team supervision and 24 hour management of active medical problems listed below. Physiatrist and rehab team continue to assess barriers to discharge/monitor patient progress toward functional and medical goals.  FIM: FIM - Bathing Bathing Steps Patient Completed: Chest, Left Arm, Abdomen, Front perineal area, Right upper leg, Left upper leg, Right lower leg (including foot), Left lower leg (including foot) Bathing: 4: Min-Patient completes 8-9 35f 10 parts or 75+ percent  FIM - Upper Body Dressing/Undressing Upper body dressing/undressing steps patient  completed: Thread/unthread right sleeve of pullover shirt/dresss, Put head through opening of pull over shirt/dress Upper body dressing/undressing: 3: Mod-Patient completed 50-74% of tasks FIM - Lower Body Dressing/Undressing Lower body dressing/undressing steps patient completed: Thread/unthread right underwear leg, Thread/unthread right pants leg, Pull pants up/down, Don/Doff right shoe Lower body dressing/undressing: 2: Max-Patient completed 25-49% of tasks  FIM - Toileting Toileting steps  completed by patient: Performs perineal hygiene Toileting: 1: Two helpers  FIM - Diplomatic Services operational officerToilet Transfers Toilet Transfers Assistive Devices: PsychiatristBedside commode Toilet Transfers: 3-To toilet/BSC: Mod A (lift or lower assist), 2-From toilet/BSC: Max A (lift and lower assist), 1-Two helpers (+2 for safety [facility])  FIM - Bed/Chair Transfer Bed/Chair Transfer Assistive Devices: Bed rails, Arm rests Bed/Chair Transfer: 3: Supine > Sit: Mod A (lifting assist/Pt. 50-74%/lift 2 legs, 3: Sit > Supine: Mod A (lifting assist/Pt. 50-74%/lift 2 legs), 2: Chair or W/C > Bed: Max A (lift and lower assist), 2: Bed > Chair or W/C: Max A (lift and lower assist)  FIM - Locomotion: Wheelchair Distance: 100 Locomotion: Wheelchair: 2: Travels 50 - 149 ft with moderate assistance (Pt: 50 - 74%) FIM - Locomotion: Ambulation Locomotion: Ambulation Assistive Devices: Other (comment) ("three muskateer style) Ambulation/Gait Assistance: 1: +2 Total assist Locomotion: Ambulation: 0: Activity did not occur  Comprehension Comprehension Mode: Auditory Comprehension: 4-Understands basic 75 - 89% of the time/requires cueing 10 - 24% of the time  Expression Expression Mode: Verbal Expression: 5-Expresses basic 90% of the time/requires cueing < 10% of the time.  Social Interaction Social Interaction: 4-Interacts appropriately 75 - 89% of the time - Needs redirection for appropriate language or to initiate interaction.  Problem Solving Problem Solving: 3-Solves basic 50 - 74% of the time/requires cueing 25 - 49% of the time  Memory Memory: 3-Recognizes or recalls 50 - 74% of the time/requires cueing 25 - 49% of the time  Medical Problem List and Plan: 1. Functional deficits secondary to right ACA infarct embolic secondary to unknown source 2.  DVT Prophylaxis/Anticoagulation: Subcutaneous Lovenox. Monitor platelet counts of any signs of bleeding. Venous Doppler studies negative 3. Pain Management: Tylenol as  needed 4. Dysphagia/left side inattention. Mechanical soft diet. Follow-up speech therapy 5. Neuropsych: This patient is capable of making decisions on his own behalf.titrate ritalin for attn , concentration and initiation 6. Skin/Wound Care: nutrition/skin checks 7. Fluids/Electrolytes/Nutrition: encourage po. Checking labs 8. Hypertension with poor medical compliance. Norvasc 10 mg daily, Coreg 6.25 mg twice a day, lisinopril 40 mg daily.   Increase hydrochlorothiazide 25 mg per day, monitor potassium 9. Urine drug screen positive for marijuana. Counseling 10. Hyperlipidemia. Lipitor 11.Low grade temp positive UA,>100K GNR  empiric keflex pnd Cx LOS (Days) 16 A FACE TO FACE EVALUATION WAS PERFORMED  KIRSTEINS,ANDREW E 11/21/2014, 10:52 AM

## 2014-11-21 NOTE — Progress Notes (Signed)
Called by primary RN to evaluate patient.  AS per RN family is upset due to they feel patient is different than yesterday, family states speech is much more slurred and harder to understand.  Spoke with patient and family, family has many concerns and feel patient is having another stroke.  Patient is alert and oriented, slow to respond and speech is slurred, per staff this is baseline.  Patient is very tearful as I am speaking with him and tells me that his back and belly hurt when he is in the wheelchair.  Family would like to speak with MD.  Sherron MondaySpoke with Trula Orehristina, RN and she paged MD  So family can speak with him.  Rn to call if assistance needed

## 2014-11-21 NOTE — Progress Notes (Signed)
Occupational Therapy Session Note  Patient Details  Name: [name] Short MRN: [id number] Date of Birth: Dec 06, 1965  Today's Date: 11/21/2014 OT Individual Time: [id number] OT Individual Time Calculation (min): 45 min    Short Term Goals: Week 3:  OT Short Term Goal 1 (Week 3): Pt will complete transfer on/off toilet with mod assist of one caregiver OT Short Term Goal 2 (Week 3): Pt will complete LB dressing with max assist OT Short Term Goal 3 (Week 3): Pt will complete bathing at sit > stand level with min assist of 1 caregiver OT Short Term Goal 4 (Week 3): Pt will complete shower transfer with mod assist of one caregiver  Skilled Therapeutic Interventions/Progress Updates:  Upon entering the room, pt supine in bed. Pt reports, "I need urinal" as call bell on upon entering the room. Pt appearing very lethargic and presenting much different than last time this therapist saw patient. RN alerted of change as she entered to give medications. Supine >sit with Max A to EOB. Pt requiring total A for clothing management and placement or urinal. Pt able to void successfully. Max A squat pivot transfer to wheelchair with chair held for safety. Pt impulsive with transfer. Pt seated in wheelchair at sink side and shakes head that he would not like to wash but change clothing. Speech appearing much slower this session as well. Pt requiring max verbal cues and tactile cues to attend to L during dressing tasks. Mod A for UB dressing and Max A for LB dressing. STS with Max A for balance. Pt seated in wheelchair with QRB donned and all needs within reach upon exiting the room.    Therapy Documentation Precautions:  Precautions Precautions: Fall Precaution Comments: Rt gaze preference, decreased attention to Lt Restrictions Weight Bearing Restrictions: No  See FIM for current functional status  Therapy/Group: Individual Therapy  Lowella Gripittman, Marcellino Fidalgo L 11/21/2014, 4:26 PM

## 2014-11-22 ENCOUNTER — Inpatient Hospital Stay (HOSPITAL_COMMUNITY): Payer: Medicaid Other | Admitting: Occupational Therapy

## 2014-11-22 ENCOUNTER — Inpatient Hospital Stay (HOSPITAL_COMMUNITY): Payer: Medicaid Other | Admitting: Physical Therapy

## 2014-11-22 NOTE — Progress Notes (Signed)
Occupational Therapy Session Note  Patient Details  Name: [name] Short MRN: [id number] Date of Birth: 09-06-1965  Today's Date: 11/22/2014 OT Individual Time: 1400-1457 OT Individual Time Calculation (min): 57 min    Short Term Goals: Week 3:  OT Short Term Goal 1 (Week 3): Pt will complete transfer on/off toilet with mod assist of one caregiver OT Short Term Goal 2 (Week 3): Pt will complete LB dressing with max assist OT Short Term Goal 3 (Week 3): Pt will complete bathing at sit > stand level with min assist of 1 caregiver OT Short Term Goal 4 (Week 3): Pt will complete shower transfer with mod assist of one caregiver  Skilled Therapeutic Interventions/Progress Updates:    Treatment session with focus on initiation, sustained attention, sit <> stand, standing balance, and participation in treatment session.  Pt in bed upon arrival with significant other present.  She reports concern that pt is verbalizing less and when does speak that words are slurred.  Pt with decreased interaction with this therapist, despite familiar therapist and typically engage in conversation during sessions.  Squat pivot bed > w/c with mod assist with pt demonstrating carryover of technique and motor control with transfer.  In therapy gym, engaged in "[place]" card game in standing with min assist sit > stand then mod assist to stabilize LLE in standing.  Engaged in forward and retro-stepping with RLE while therapist facilitated weight shift and stabilization at Lt knee.  +2 for safety during standing.  Alternated weight bearing through LUE during card game with use of Lt hand to grasp cards.  Pt required 3 rest breaks during session.  Required increased wait time and max encouragement to engage in single word answers.  Notified RN of difference in response time and verbalizations.  Therapy Documentation Precautions:  Precautions Precautions: Fall Precaution Comments: Rt gaze preference, decreased attention to  Lt Restrictions Weight Bearing Restrictions: No General:   Vital Signs: Therapy Vitals Temp: 98.8 F (37.1 C) Temp Source: Oral Pulse Rate: 80 Resp: 20 BP: 107/70 mmHg Patient Position (if appropriate): Lying Oxygen Therapy SpO2: 99 % O2 Device: Not Delivered Pain:  Pt with no c/o pain  See FIM for current functional status  Therapy/Group: Individual Therapy  Rosalio LoudHOXIE, Zephaniah Lubrano 11/22/2014, 3:05 PM

## 2014-11-22 NOTE — Progress Notes (Signed)
RN checked on patient per report of slurred speech from OT. Patient lying in bed when RN came in. No complaints of pain or discomfort. He is able to follows simple commands and answers questions. Alert oriented x 4 but slow to respond with some slurred speech. MD aware of this from yesterday's report from RN due to family's concern  about patient's cognition and speech. Vitals 99.3, 107/60 hr 65, 97% on RA. Tylenol given PRN for fever. Will monitor.

## 2014-11-22 NOTE — Progress Notes (Signed)
Occupational Therapy Session Note  Patient Details  Name: [name] Short MRN: [id number] Date of Birth: Feb 29, 1966  Today's Date: 11/22/2014 OT Individual Time: 1645-1745  (60 min) OT Individual Time Calculation (min): 60 min    Short Term Goals: Week 1:  OT Short Term Goal 1 (Week 1): Pt will complete bathing with max assist of 1 caregiver at bed level to EOB OT Short Term Goal 1 - Progress (Week 1): Met OT Short Term Goal 2 (Week 1): Pt will complete UB dressing with mod assist OT Short Term Goal 2 - Progress (Week 1): Met OT Short Term Goal 3 (Week 1): Pt will maintain static sititng balance at EOB for 1 min in preparation for self-care task OT Short Term Goal 3 - Progress (Week 1): Met OT Short Term Goal 4 (Week 1): Pt will complete toilet transfer to drop arm BSC with max assist  OT Short Term Goal 4 - Progress (Week 1): Partly met Week 2:  OT Short Term Goal 1 (Week 2): Pt will complete transfer on/off toilet with mod assist of one caregiver OT Short Term Goal 1 - Progress (Week 2): Progressing toward goal OT Short Term Goal 2 (Week 2): Pt will complete bathing at sit > stand level with mod assist of 1 caregiver OT Short Term Goal 2 - Progress (Week 2): Met OT Short Term Goal 3 (Week 2): Pt will complete LB dressing with max assist OT Short Term Goal 3 - Progress (Week 2): Progressing toward goal OT Short Term Goal 4 (Week 2): Pt will complete UB dressing with min assist OT Short Term Goal 4 - Progress (Week 2): Met Week 3:  OT Short Term Goal 1 (Week 3): Pt will complete transfer on/off toilet with mod assist of one caregiver OT Short Term Goal 2 (Week 3): Pt will complete LB dressing with max assist OT Short Term Goal 3 (Week 3): Pt will complete bathing at sit > stand level with min assist of 1 caregiver OT Short Term Goal 4 (Week 3): Pt will complete shower transfer with mod assist of one caregiver  Skilled Therapeutic Interventions/Progress Updates:    Pt lying in bed upon OT  arrival.  Addressed response time. Increased word enunciation, eating safety, standing balance,   Pt  Verbalized that he needed to use the urinal.  It took 3 times before therapist could understand what pt want.  He went from supine to sit with mod assist.  Assisted with sit to stand to doff pants and depends with max assist.  Sat EOB to use urinal.  Pt asked for towel to wipe with after urination.  Pt did not want to get in wc for dinner but agreed to sit EOB.  Pt ate with maximal physical and verbal cues to eat slowly and drink between bites.  OT muted TV for pt to attend to meal.  He ate 50 % of meal and drank his tea.  He sat to brush teeth with pt mostly brushing the right side of his mouth.  Pt repositioned in bed.  He reported his neck was hurting.  OT provided myotherapy to neck.  .  Pt  quieter today than usual and speech more difficult to understand.  Informed RN.   Therapy Documentation Precautions:  Precautions Precautions: Fall Precaution Comments: Rt gaze preference, decreased attention to Lt Restrictions Weight Bearing Restrictions: No General:   Vital Signs: Therapy Vitals Pulse Rate: 80 Pain:  Neck pain;  no number given   See  FIM for current functional status  Therapy/Group: Individual Therapy  Lisa Roca 11/22/2014, 5:53 PM

## 2014-11-22 NOTE — Progress Notes (Signed)
Subjective/Complaints: Pt eating well with supervision, per Aide urine is cloudy, no fevers overnite Discussed UTI and effect on cognitive status on a CVA pt Review of systems unable to obtain secondary to cognitive status Objective: Vital Signs: Blood pressure 129/78, pulse 63, temperature 98.6 F (37 C), temperature source Oral, resp. rate 19, weight 91.4 kg (201 lb 8 oz), SpO2 100 %. No results found. Results for orders placed or performed during the hospital encounter of 11/05/14 (from the past 72 hour(s))  Glucose, capillary     Status: None   Collection Time: 11/20/14  9:34 PM  Result Value Ref Range   Glucose-Capillary 99 70 - 99 mg/dL   Comment 1 Notify RN      HEENT: normal and extensive gold caps Cardio: RRR and no murmurs Resp: CTA B/L and unlabored GI: BS positive and nontender nondistended Extremity:  No Edema Skin:   Intact Neuro: Lethargic, Flat, Abnormal  Delayed responses Sensory reduced sensation in the left lower extremity greater than left upper extremity to pinch, difficult to assess secondary to affect, Abnormal Motor 5/5 in the right deltoid, biceps, triceps, grip, hip flexor, knee extensor, ankle dorsiflexor and plantar flexor and Inattention 0/5 in the left deltoid, 2 minus biceps triceps,2 minus finger flexors, 0/5 and left hip flexor knee extensor and ankle dorsiflexor Gen. no acute distress   Assessment/Plan: 1. Functional deficits secondary to right ACA infarct with left hemiparesis as well as cognitive deficits which require 3+ hours per day of interdisciplinary therapy in a comprehensive inpatient rehab setting. Physiatrist is providing close team supervision and 24 hour management of active medical problems listed below. Physiatrist and rehab team continue to assess barriers to discharge/monitor patient progress toward functional and medical goals.  FIM: FIM - Bathing Bathing Steps Patient Completed: Chest, Left Arm, Abdomen, Front perineal area,  Right upper leg, Left upper leg, Right lower leg (including foot), Left lower leg (including foot) Bathing: 4: Min-Patient completes 8-9 57f 10 parts or 75+ percent  FIM - Upper Body Dressing/Undressing Upper body dressing/undressing steps patient completed: Thread/unthread right sleeve of pullover shirt/dresss, Put head through opening of pull over shirt/dress Upper body dressing/undressing: 3: Mod-Patient completed 50-74% of tasks FIM - Lower Body Dressing/Undressing Lower body dressing/undressing steps patient completed: Thread/unthread right underwear leg, Thread/unthread right pants leg Lower body dressing/undressing: 2: Max-Patient completed 25-49% of tasks  FIM - Toileting Toileting steps completed by patient: Performs perineal hygiene Toileting: 1: Two helpers  FIM - Diplomatic Services operational officer Devices: Psychiatrist Transfers: 3-To toilet/BSC: Mod A (lift or lower assist), 2-From toilet/BSC: Max A (lift and lower assist), 1-Two helpers (+2 for safety to stabilize [facility])  FIM - Bed/Chair Transfer Bed/Chair Transfer Assistive Devices: Bed rails, Arm rests Bed/Chair Transfer: 2: Chair or W/C > Bed: Max A (lift and lower assist), 2: Supine > Sit: Max A (lifting assist/Pt. 25-49%)  FIM - Locomotion: Wheelchair Distance: 100 Locomotion: Wheelchair: 2: Travels 50 - 149 ft with moderate assistance (Pt: 50 - 74%) FIM - Locomotion: Ambulation Locomotion: Ambulation Assistive Devices: Other (comment) ("three muskateer style) Ambulation/Gait Assistance: 1: +2 Total assist Locomotion: Ambulation: 0: Activity did not occur  Comprehension Comprehension Mode: Auditory Comprehension: 4-Understands basic 75 - 89% of the time/requires cueing 10 - 24% of the time  Expression Expression Mode: Verbal Expression: 5-Expresses complex 90% of the time/cues < 10% of the time  Social Interaction Social Interaction: 4-Interacts appropriately 75 - 89% of the time - Needs  redirection for appropriate language  or to initiate interaction.  Problem Solving Problem Solving: 3-Solves basic 50 - 74% of the time/requires cueing 25 - 49% of the time  Memory Memory: 3-Recognizes or recalls 50 - 74% of the time/requires cueing 25 - 49% of the time  Medical Problem List and Plan: 1. Functional deficits secondary to right ACA infarct embolic secondary to unknown source 2.  DVT Prophylaxis/Anticoagulation: Subcutaneous Lovenox. Monitor platelet counts of any signs of bleeding. Venous Doppler studies negative 3. Pain Management: Tylenol as needed 4. Dysphagia/left side inattention. Mechanical soft diet. Follow-up speech therapy 5. Neuropsych: This patient is capable of making decisions on his own behalf.titrate ritalin for attn , concentration and initiation 6. Skin/Wound Care: nutrition/skin checks 7. Fluids/Electrolytes/Nutrition: encourage po. Checking labs 8. Hypertension with poor medical compliance. Norvasc 10 mg daily, Coreg 6.25 mg twice a day, lisinopril 40 mg daily.   Increase hydrochlorothiazide 25 mg per day, monitor potassium 9. Urine drug screen positive for marijuana. Counseling 10. Hyperlipidemia. Lipitor 11.UTI positive UA,>100K GNR  ecoli sens to Keflex LOS (Days) 17 A FACE TO FACE EVALUATION WAS PERFORMED  Imogean Ciampa E 11/22/2014, 10:25 AM

## 2014-11-22 NOTE — Progress Notes (Addendum)
Physical Therapy Session Note  Patient Details  Name: [name] Short MRN: [id number] Date of Birth: 1966-05-02  Today's Date: 11/22/2014 PT Individual Time: 1100-1200 PT Individual Time Calculation (min): 60 min   Short Term Goals: Week 1:  PT Short Term Goal 1 (Week 1): Pt will perform rolling R with bed rail max A and 100% cues. PT Short Term Goal 1 - Progress (Week 1): Met PT Short Term Goal 2 (Week 1): Pt will perform rolling L with bed rail mod A and 75% cues.  PT Short Term Goal 2 - Progress (Week 1): Met PT Short Term Goal 3 (Week 1): Pt will perform supine to sit total A of single therapist with 100% cues and HOB flat using rail. PT Short Term Goal 3 - Progress (Week 1): Progressing toward goal PT Short Term Goal 4 (Week 1): Pt will perform sit to supine total A of 1 with 100% and HOB flat. PT Short Term Goal 4 - Progress (Week 1): Met PT Short Term Goal 5 (Week 1): Pt will perform dynamic sitting balance with distractions for 2 minutes with bilat LE support, RUE support requiring max A. PT Short Term Goal 5 - Progress (Week 1): Met  Skilled Therapeutic Interventions/Progress Updates:  Pt was seen bedside in the am. Pt easily distracted required multiple verbal cues throughout treatment session to redirect to task at hand. Pt rolled L with side rail and mod A and rolled R with side rail and max A with verbal cues. Pt transferred supine to edge of bed with side rail and max A. Pt transferred edge of bed with arm rest and max A and verbal cues. Pt propelled w/c with R UE and LE about 100 feet with min A and verbal cues. Pt performed multiple sit to stand with mod A and verbal cues in parallel bars. While standing worked on [employer] including weight shifting, unilateral stance, pre gait activities. Also worked on steps in parallel bars with max A. Pt ambulated with R hand rail and max A with w/c for follow for 10 feet x 2 with verbal cues and physical assist. Pt propelled w/c about 50 feet with  min A and verbal cues with R UE and B LEs. Pt left sitting up in w/c with quick release belt in place and call bell with reach.    Therapy Documentation Precautions:  Precautions Precautions: Fall Precaution Comments: Rt gaze preference, decreased attention to Lt Restrictions Weight Bearing Restrictions: No General:   Pain: No c/o pain.    Locomotion : Ambulation Ambulation/Gait Assistance: 2: Max assist   See FIM for current functional status  Therapy/Group: Individual Therapy  Dub Amis 11/22/2014, 12:53 PM

## 2014-11-23 ENCOUNTER — Inpatient Hospital Stay (HOSPITAL_COMMUNITY): Payer: Medicaid Other | Admitting: Occupational Therapy

## 2014-11-23 ENCOUNTER — Inpatient Hospital Stay (HOSPITAL_COMMUNITY): Payer: Self-pay | Admitting: Speech Pathology

## 2014-11-23 ENCOUNTER — Inpatient Hospital Stay (HOSPITAL_COMMUNITY): Payer: Self-pay | Admitting: Rehabilitation

## 2014-11-23 LAB — BASIC METABOLIC PANEL
Anion gap: 10 (ref 5–15)
BUN: 57 mg/dL — ABNORMAL HIGH (ref 6–20)
CO2: 20 mmol/L — ABNORMAL LOW (ref 22–32)
Calcium: 9.3 mg/dL (ref 8.9–10.3)
Chloride: 104 mmol/L (ref 101–111)
Creatinine, Ser: 1.89 mg/dL — ABNORMAL HIGH (ref 0.61–1.24)
GFR calc Af Amer: 47 mL/min — ABNORMAL LOW (ref >60)
GFR calc non Af Amer: 41 mL/min — ABNORMAL LOW (ref >60)
GLUCOSE: 102 mg/dL — AB (ref 70–99)
Potassium: 3.7 mmol/L (ref 3.5–5.1)
SODIUM: 134 mmol/L — AB (ref 135–145)

## 2014-11-23 MED ORDER — CEPHALEXIN 250 MG PO CAPS
250.0000 mg | ORAL_CAPSULE | Freq: Once | ORAL | Status: AC
  Administered 2014-11-23: 250 mg via ORAL
  Filled 2014-11-23: qty 1

## 2014-11-23 MED ORDER — SODIUM CHLORIDE 0.45 % IV SOLN
INTRAVENOUS | Status: DC
  Administered 2014-11-23 – 2014-11-29 (×6): via INTRAVENOUS

## 2014-11-23 NOTE — Progress Notes (Signed)
Patient's returned labs show BUN 57 creatinine 1.89. We will begin some gentle IV fluids. Follow-up chemistries in a.m. Strict I&O's and encourage by mouth fluids. Presently on Keflex for UTI. All issues discussed with wife.

## 2014-11-23 NOTE — Progress Notes (Signed)
Subjective/Complaints: Pt feels bad, but looks brighter points to IV in R forearm Review of systems unable to obtain secondary to cognitive status Objective: Vital Signs: Blood pressure 111/67, pulse 67, temperature 98.2 F (36.8 C), temperature source Oral, resp. rate 19, weight 91.4 kg (201 lb 8 oz), SpO2 99 %. No results found. Results for orders placed or performed during the hospital encounter of 11/05/14 (from the past 72 hour(s))  Glucose, capillary     Status: None   Collection Time: 11/20/14  9:34 PM  Result Value Ref Range   Glucose-Capillary 99 70 - 99 mg/dL   Comment 1 Notify RN      HEENT: normal and extensive gold caps Cardio: RRR and no murmurs Resp: CTA B/L and unlabored GI: BS positive and nontender nondistended Extremity:  No Edema, IV site non tender no erythema or drainage Skin:   Intact Neuro: Lethargic, Flat, Abnormal  Delayed responses Sensory reduced sensation in the left lower extremity greater than left upper extremity to pinch, difficult to assess secondary to affect, Abnormal Motor 5/5 in the right deltoid, biceps, triceps, grip, hip flexor, knee extensor, ankle dorsiflexor and plantar flexor and Inattention 0/5 in the left deltoid, 2 minus biceps triceps,2 minus finger flexors, 0/5 and left hip flexor knee extensor and ankle dorsiflexor Gen. no acute distress   Assessment/Plan: 1. Functional deficits secondary to right ACA infarct with left hemiparesis as well as cognitive deficits which require 3+ hours per day of interdisciplinary therapy in a comprehensive inpatient rehab setting. Physiatrist is providing close team supervision and 24 hour management of active medical problems listed below. Physiatrist and rehab team continue to assess barriers to discharge/monitor patient progress toward functional and medical goals.  FIM: FIM - Bathing Bathing Steps Patient Completed: Chest, Left Arm, Abdomen, Front perineal area, Right upper leg, Left upper  leg, Right lower leg (including foot), Left lower leg (including foot) Bathing: 4: Min-Patient completes 8-9 [id number] 10 parts or 75+ percent  FIM - Upper Body Dressing/Undressing Upper body dressing/undressing steps patient completed: Thread/unthread right sleeve of pullover shirt/dresss, Put head through opening of pull over shirt/dress Upper body dressing/undressing: 3: Mod-Patient completed 50-74% of tasks FIM - Lower Body Dressing/Undressing Lower body dressing/undressing steps patient completed: Thread/unthread right underwear leg, Thread/unthread right pants leg Lower body dressing/undressing: 2: Max-Patient completed 25-49% of tasks  FIM - Toileting Toileting steps completed by patient: Performs perineal hygiene Toileting: 2: Max-Patient completed 1 of 3 steps  FIM - Diplomatic Services operational officerToilet Transfers Toilet Transfers Assistive Devices: Bedside commode Toilet Transfers: 3-To toilet/BSC: Mod A (lift or lower assist), 2-From toilet/BSC: Max A (lift and lower assist), 1-Two helpers (+2 for safety [facility])  FIM - Bed/Chair Transfer Bed/Chair Transfer Assistive Devices: Bed rails, Arm rests Bed/Chair Transfer: 3: Supine > Sit: Mod A (lifting assist/Pt. 50-74%/lift 2 legs, 3: Bed > Chair or W/C: Mod A (lift or lower assist)  FIM - Locomotion: Wheelchair Distance: 100 Locomotion: Wheelchair: 2: Travels 50 - 149 ft with minimal assistance (Pt.>75%) FIM - Locomotion: Ambulation Locomotion: Ambulation Assistive Devices: Other (comment) (R hand rail) Ambulation/Gait Assistance: 2: Max assist Locomotion: Ambulation: 1: Two helpers  Comprehension Comprehension Mode: Auditory Comprehension: 4-Understands basic 75 - 89% of the time/requires cueing 10 - 24% of the time  Expression Expression Mode: Verbal Expression: 4-Expresses basic 75 - 89% of the time/requires cueing 10 - 24% of the time. Needs helper to occlude trach/needs to repeat words.  Social Interaction Social Interaction: 4-Interacts  appropriately 75 - 89% of  the time - Needs redirection for appropriate language or to initiate interaction.  Problem Solving Problem Solving: 3-Solves basic 50 - 74% of the time/requires cueing 25 - 49% of the time  Memory Memory: 3-Recognizes or recalls 50 - 74% of the time/requires cueing 25 - 49% of the time  Medical Problem List and Plan: 1. Functional deficits secondary to right ACA infarct embolic secondary to unknown source 2.  DVT Prophylaxis/Anticoagulation: Subcutaneous Lovenox. Monitor platelet counts of any signs of bleeding. Venous Doppler studies negative 3. Pain Management: Tylenol as needed 4. Dysphagia/left side inattention. Mechanical soft diet. Follow-up speech therapy 5. Neuropsych: This patient is capable of making decisions on his own behalf.titrate ritalin for attn , concentration and initiation 6. Skin/Wound Care: nutrition/skin checks 7. Fluids/Electrolytes/Nutrition: encourage po. Checking labs 8. Hypertension with poor medical compliance. Norvasc 10 mg daily, Coreg 6.25 mg twice a day, lisinopril 40 mg daily.   Increase hydrochlorothiazide 25 mg per day, monitor potassium 9. Urine drug screen positive for marijuana. Counseling 10. Hyperlipidemia. Lipitor 11.UTI positive UA,>100K GNR  ecoli sens to Keflex, will d/c IVF, urine looks clear LOS (Days) 18 A FACE TO FACE EVALUATION WAS PERFORMED  Nicholas Trompeter E 11/23/2014, 8:36 AM

## 2014-11-23 NOTE — Progress Notes (Signed)
Physical Therapy Session Note  Patient Details  Name: [name] Short Sheppard MRN: [id number] Date of Birth: 1966-04-04  Today's Date: 11/23/2014 PT Individual Time: 1500-1600 PT Individual Time Calculation (min): 60 min   Short Term Goals: Week 3:  PT Short Term Goal 1 (Week 3): Pt will perform all aspects of bed mobility with HOB flat and with rail at mod A with 50% cues.  PT Short Term Goal 2 (Week 3): Pt will perform squat pivot transfers R or L with mod A of single therapist with 50% cues.  PT Short Term Goal 3 (Week 3): Pt will perform dynamic standing balance tasks without UE support at mod A and 50% cues.  PT Short Term Goal 4 (Week 3): Pt will perform gait x 25' w/ LRAD and max A with 75% cues and +2 for chair follow  Skilled Therapeutic Interventions/Progress Updates:   Pt received lying in bed, very difficult to arouse and get to initiate participation in any mobility.  Assisted into sitting with max A and max verbal cues to [facility] during task.  Once at EOB, assisted with donning shoes.  Transferred to w/c via squat pivot at max A level with continued verbal and HOH cues for hand placement and initiation.  Assisted to therapy gym via w/c at total A level.  Skilled session focused on dynamic standing task to address attention, reaching to the R to decrease pusher tendencies, increase activation in LLE, trunk rotation and initiation.  Also performed task in L SL over wedge to increase ROM and extensibility in R trunk due to constant shortening in sitting.  While in SL, worked on reaching task again goals as above.  Requires mod A for standing tasks and max verbal cues for participation when in SL due to falling asleep.  Note pt very fatigued and flat affect today vs previous sessions.  Spoke with PA who states pt has UTI and has just began medication to address.  Will assess behavior as he is being treated.  Assisted pt back to nursing station with quick release belt donned  and pillow in chair for better positioning.  RN made aware and all needs in reach.   Therapy Documentation Precautions:  Precautions Precautions: Fall Precaution Comments: Rt gaze preference, decreased attention to Lt Restrictions Weight Bearing Restrictions: No   Vital Signs: Therapy Vitals Temp: 98.6 F (37 C) Temp Source: Oral Pulse Rate: (!) 56 BP: 113/63 mmHg Patient Position (if appropriate): Lying Oxygen Therapy SpO2: 100 % O2 Device: Not Delivered Pain: Pain Assessment Pain Assessment: 0-10 Pain Score: 5  Pain Location: Generalized Pain Intervention(s): RN made aware   Locomotion : Ambulation Ambulation/Gait Assistance: 1: +2 Total assist  See FIM for current functional status  Therapy/Group: Individual Therapy  Vista Deckarcell, Celestina Gironda Ann 11/23/2014, 4:40 PM

## 2014-11-23 NOTE — Plan of Care (Signed)
Problem: RH BOWEL ELIMINATION Goal: RH STG MANAGE BOWEL WITH ASSISTANCE STG Manage Bowel with Assistance. Min A  Outcome: Progressing No incontinent episode reported

## 2014-11-23 NOTE — Progress Notes (Signed)
Occupational Therapy Session Note  Patient Details  Name: [name] Short MRN: [id number] Date of Birth: 10-28-1965  Today's Date: 11/23/2014 OT Individual Time: [id number] OT Individual Time Calculation (min): 90 min    Short Term Goals: Week 3:  OT Short Term Goal 1 (Week 3): Pt will complete transfer on/off toilet with mod assist of one caregiver OT Short Term Goal 2 (Week 3): Pt will complete LB dressing with max assist OT Short Term Goal 3 (Week 3): Pt will complete bathing at sit > stand level with min assist of 1 caregiver OT Short Term Goal 4 (Week 3): Pt will complete shower transfer with mod assist of one caregiver  Skilled Therapeutic Interventions/Progress Updates:    Engaged in ADL retraining with focus on initiation, transfers, sit <> stand, and midline sitting balance.  Pt sliding out of w/c upon arrival due to LLE extensor tone, quick release belt keeping pt in w/c. Repositioned in chair with mod assist and blocking at Lt knee to get buttocks back in w/c. Bathing completed at sit > stand level at sink with increased use of LUE when washing Rt arm, requiring tactile cues to initiate movement.  Pt with decreased problem solving this session with UB dressing, requiring max redirection.  Pt able to don Lt pant leg this session with LLE crossed over Rt knee, therapist positioning LLE and maintaining position while pt thread pant leg.    In therapy gym engaged in lateral scooting along therapy mat to Lt with focus on anterior weight shift and lift off of buttocks with scooting.  Assessed BP in sitting as noted pt to have increased lethargy with some drooling and eyes rolling back as falling asleep.  Notified RN of 96/71 vitals and decreased arousal.  Engaged in NMR in sidelying with focus on trunk elongation on Rt to address shortening and pain in Rt ribs and back.  Pt with improved static sitting balance post trunk elongation, still demonstrating shortening but reporting less pain  immediately after session.  Therapy Documentation Precautions:  Precautions Precautions: Fall Precaution Comments: Rt gaze preference, decreased attention to Lt Restrictions Weight Bearing Restrictions: No General:   Vital Signs: Therapy Vitals Pulse Rate: 72 BP: 110/80 mmHg Patient Position (if appropriate): Sitting Pain:  Pt reports pain in back and Rt rib area, repositioned.  See FIM for current functional status  Therapy/Group: Individual Therapy  Rosalio LoudHOXIE, Saahas Hidrogo 11/23/2014, 1:38 PM

## 2014-11-23 NOTE — Progress Notes (Signed)
Speech Language Pathology Daily Session Note  Patient Details  Name: [name] Short Sheppard MRN: [id number] Date of Birth: September 26, 1965  Today's Date: 11/23/2014 SLP Individual Time: 0800-0900 SLP Individual Time Calculation (min): 60 min  Short Term Goals: Week 3: SLP Short Term Goal 1 (Week 3): Pt will tolerate dys 3 textures and thin liquids with supervision cues for use of rate and portion control for three consecutive sessions prior to diet advancement.   SLP Short Term Goal 2 (Week 3): Pt will sustain attention to a functional task for 5-10 minutes with min A verbal and tactile cues. SLP Short Term Goal 3 (Week 3): Pt will attend to the L side of the environment during a basic functional task with supervision. SLP Short Term Goal 4 (Week 3): Pt will identify 2 cognitive deficits and 2 physical deficits with mod A question cues. SLP Short Term Goal 5 (Week 3): Pt will complete basic functional problem solving tasks with min assist verbal and visual cues.    Skilled Therapeutic Interventions:  Pt was seen for skilled ST targeting goals for dysarthria and dysphagia.  Upon arrival, pt was in bed and awake, decreased response latency noted since last therapy session.  Pt transferred to wheelchair to maximize attention and alertness during structured tasks.  Pt consumed dys 3 textures and thin liquids with min verbal cues to alternate solids and liquids to clear oral residue post swallow.  No overt s/s of aspiration noted with solids or liquids.  Pt was significantly more dysarthric today in comparison to previous therapy sessions and was only intelligible in words/phrases with mod-max verbal cues to increase vocal intensity and utilize slow rate when speaking.  Pt also benefited from mod-max verbal, visual and tactile cues for initiation and cessation of oral care in a timely manner as he was noted to perseverate on swishing and spitting cup sips of water into the sink.  Continue per current plan of care.     FIM:  Comprehension Comprehension Mode: Auditory Comprehension: 4-Understands basic 75 - 89% of the time/requires cueing 10 - 24% of the time Expression Expression Mode: Verbal Expression: 3-Expresses basic 50 - 74% of the time/requires cueing 25 - 50% of the time. Needs to repeat parts of sentences. Social Interaction Social Interaction: 3-Interacts appropriately 50 - 74% of the time - May be physically or verbally inappropriate. Problem Solving Problem Solving: 2-Solves basic 25 - 49% of the time - needs direction more than half the time to initiate, plan or complete simple activities Memory Memory: 3-Recognizes or recalls 50 - 74% of the time/requires cueing 25 - 49% of the time FIM - Eating Eating Activity: 5: Supervision/cues;5: Set-up assist for open containers  Pain Pain Assessment Pain Assessment: 0-10 Pain Score: 5  Pain Location: Generalized Pain Intervention(s): RN made aware  Therapy/Group: Individual Therapy  Rheanne Cortopassi, [name] 11/23/2014, 4:12 PM

## 2014-11-24 ENCOUNTER — Inpatient Hospital Stay (HOSPITAL_COMMUNITY): Payer: Medicaid Other | Admitting: Occupational Therapy

## 2014-11-24 ENCOUNTER — Inpatient Hospital Stay (HOSPITAL_COMMUNITY): Payer: Self-pay | Admitting: Rehabilitation

## 2014-11-24 ENCOUNTER — Inpatient Hospital Stay (HOSPITAL_COMMUNITY): Payer: Self-pay | Admitting: Speech Pathology

## 2014-11-24 DIAGNOSIS — R7989 Other specified abnormal findings of blood chemistry: Secondary | ICD-10-CM

## 2014-11-24 LAB — BASIC METABOLIC PANEL
ANION GAP: 12 (ref 5–15)
BUN: 44 mg/dL — ABNORMAL HIGH (ref 6–20)
CALCIUM: 9.1 mg/dL (ref 8.9–10.3)
CO2: 19 mmol/L — ABNORMAL LOW (ref 22–32)
CREATININE: 1.51 mg/dL — AB (ref 0.61–1.24)
Chloride: 103 mmol/L (ref 101–111)
GFR calc Af Amer: >60 mL/min (ref >60)
GFR, EST NON AFRICAN AMERICAN: 53 mL/min — AB (ref >60)
Glucose, Bld: 88 mg/dL (ref 70–99)
Potassium: 3.6 mmol/L (ref 3.5–5.1)
Sodium: 134 mmol/L — ABNORMAL LOW (ref 135–145)

## 2014-11-24 MED ORDER — HYDROCHLOROTHIAZIDE 12.5 MG PO CAPS
12.5000 mg | ORAL_CAPSULE | Freq: Every day | ORAL | Status: DC
  Administered 2014-11-25: 12.5 mg via ORAL
  Filled 2014-11-24 (×2): qty 1

## 2014-11-24 NOTE — Progress Notes (Signed)
Speech Language Pathology Daily Session Note  Patient Details  Name: [name] Short MRN: [id number] Date of Birth: 01/22/1966  Today's Date: 11/24/2014 SLP Individual Time: 1303-1400 SLP Individual Time Calculation (min): 57 min  Short Term Goals: Week 3: SLP Short Term Goal 1 (Week 3): Pt will tolerate dys 3 textures and thin liquids with supervision cues for use of rate and portion control for three consecutive sessions prior to diet advancement.   SLP Short Term Goal 2 (Week 3): Pt will sustain attention to a functional task for 5-10 minutes with min A verbal and tactile cues. SLP Short Term Goal 3 (Week 3): Pt will attend to the Short side of the environment during a basic functional task with supervision. SLP Short Term Goal 4 (Week 3): Pt will identify 2 cognitive deficits and 2 physical deficits with mod A question cues. SLP Short Term Goal 5 (Week 3): Pt will complete basic functional problem solving tasks with min assist verbal and visual cues.    Skilled Therapeutic Interventions:  Pt was seen for skilled ST targeting cognitive goals.  Upon arrival, pt was laying across bed with nurse tech, daughter, and granddaughter present.  SLP assisted in transferring pt to wheelchair to maximize attention and alertness during structured tasks.  SLP facilitated the session with a basic 4-step sequencing task in a minimally distracting environment targeting sustained attention, initiation, and functional problem solving.  Throughout the task, pt required max auditory and tactile stimulation to maintain alertness; however, once alert he was able to sequence 4 pictures with min-mod assist verbal and visual cues.  Pt sustained attention to task for periods of ~30 seconds to 1 minute before requiring max cues for redirection.  Pt's daughter voiced concerns about pt's decreased verbal output and slurring of speech.  During therapy, pt required max assist to initiate functional communication and his speech was less  dysarthric in comparison to previous therapy sessions.  Continue per current plan of care.     FIM:  Comprehension Comprehension Mode: Auditory Comprehension: 4-Understands basic 75 - 89% of the time/requires cueing 10 - 24% of the time Expression Expression Mode: Verbal Expression: 4-Expresses basic 75 - 89% of the time/requires cueing 10 - 24% of the time. Needs helper to occlude trach/needs to repeat words. Social Interaction Social Interaction: 3-Interacts appropriately 50 - 74% of the time - May be physically or verbally inappropriate. Problem Solving Problem Solving: 3-Solves basic 50 - 74% of the time/requires cueing 25 - 49% of the time Memory Memory: 3-Recognizes or recalls 50 - 74% of the time/requires cueing 25 - 49% of the time FIM - Eating Eating Activity: 5: Supervision/cues  Pain Pain Assessment Pain Assessment: No/denies pain  Therapy/Group: Individual Therapy  [name] Short, [name] Short 11/24/2014, 4:01 PM

## 2014-11-24 NOTE — Progress Notes (Signed)
Subjective/Complaints: Delayed responses, alert in bed ready for breakfast Review of systems unable to obtain secondary to cognitive status Objective: Vital Signs: Blood pressure 114/79, pulse 64, temperature 98.2 F (36.8 C), temperature source Oral, resp. rate 18, weight 91.4 kg (201 lb 8 oz), SpO2 100 %. No results found. Results for orders placed or performed during the hospital encounter of 11/05/14 (from the past 72 hour(s))  Basic metabolic panel     Status: Abnormal   Collection Time: 11/23/14 11:45 AM  Result Value Ref Range   Sodium 134 (L) 135 - 145 mmol/L   Potassium 3.7 3.5 - 5.1 mmol/L   Chloride 104 101 - 111 mmol/L   CO2 20 (L) 22 - 32 mmol/L   Glucose, Bld 102 (H) 70 - 99 mg/dL   BUN 57 (H) 6 - 20 mg/dL   Creatinine, Ser 1.89 (H) 0.61 - 1.24 mg/dL   Calcium 9.3 8.9 - 10.3 mg/dL   GFR calc non Af Amer 41 (L) >60 mL/min   GFR calc Af Amer 47 (L) >60 mL/min    Comment: (NOTE) The eGFR has been calculated using the CKD EPI equation. This calculation has not been validated in all clinical situations. eGFR's persistently <90 mL/min signify possible Chronic Kidney Disease.    Anion gap 10 5 - 15  Basic metabolic panel     Status: Abnormal   Collection Time: 11/24/14  6:20 AM  Result Value Ref Range   Sodium 134 (L) 135 - 145 mmol/L   Potassium 3.6 3.5 - 5.1 mmol/L   Chloride 103 101 - 111 mmol/L   CO2 19 (L) 22 - 32 mmol/L   Glucose, Bld 88 70 - 99 mg/dL   BUN 44 (H) 6 - 20 mg/dL   Creatinine, Ser 1.51 (H) 0.61 - 1.24 mg/dL   Calcium 9.1 8.9 - 10.3 mg/dL   GFR calc non Af Amer 53 (L) >60 mL/min   GFR calc Af Amer >60 >60 mL/min    Comment: (NOTE) The eGFR has been calculated using the CKD EPI equation. This calculation has not been validated in all clinical situations. eGFR's persistently <90 mL/min signify possible Chronic Kidney Disease.    Anion gap 12 5 - 15     HEENT: normal and extensive gold caps Cardio: RRR and no murmurs Resp: CTA B/L and  unlabored GI: BS positive and nontender nondistended Extremity:  No Edema, IV site non tender no erythema or drainage Skin:   Intact Neuro: Lethargic, Flat, Abnormal  Delayed responses Sensory reduced sensation in the left lower extremity greater than left upper extremity to pinch, difficult to assess secondary to affect, Abnormal Motor 5/5 in the right deltoid, biceps, triceps, grip, hip flexor, knee extensor, ankle dorsiflexor and plantar flexor and Inattention 0/5 in the left deltoid, 2 minus biceps triceps,2 minus finger flexors, 0/5 and left hip flexor knee extensor and ankle dorsiflexor Gen. no acute distress   Assessment/Plan: 1. Functional deficits secondary to right ACA infarct with left hemiparesis as well as cognitive deficits which require 3+ hours per day of interdisciplinary therapy in a comprehensive inpatient rehab setting. Physiatrist is providing close team supervision and 24 hour management of active medical problems listed below. Physiatrist and rehab team continue to assess barriers to discharge/monitor patient progress toward functional and medical goals.  FIM: FIM - Bathing Bathing Steps Patient Completed: Chest, Left Arm, Abdomen, Front perineal area, Right upper leg, Left upper leg, Buttocks Bathing: 3: Mod-Patient completes 5-7 61f10 parts or 50-74%  FIM - Upper Body Dressing/Undressing Upper body dressing/undressing steps patient completed: Thread/unthread right sleeve of pullover shirt/dresss, Put head through opening of pull over shirt/dress Upper body dressing/undressing: 3: Mod-Patient completed 50-74% of tasks FIM - Lower Body Dressing/Undressing Lower body dressing/undressing steps patient completed: Thread/unthread left pants leg Lower body dressing/undressing: 2: Max-Patient completed 25-49% of tasks  FIM - Toileting Toileting steps completed by patient: Performs perineal hygiene Toileting: 2: Max-Patient completed 1 of 3 steps  FIM - Glass blower/designer Devices: Bedside commode Toilet Transfers: 3-To toilet/BSC: Mod A (lift or lower assist), 2-From toilet/BSC: Max A (lift and lower assist), 1-Two helpers (+2 for safety to stabilize [facility])  FIM - Bed/Chair Transfer Bed/Chair Transfer Assistive Devices: Bed rails, Arm rests Bed/Chair Transfer: 2: Supine > Sit: Max A (lifting assist/Pt. 25-49%), 2: Bed > Chair or W/C: Max A (lift and lower assist)  FIM - Locomotion: Wheelchair Distance: 100 Locomotion: Wheelchair: 0: Activity did not occur FIM - Locomotion: Ambulation Locomotion: Ambulation Assistive Devices: Other (comment) ("three muskateer style") Ambulation/Gait Assistance: 1: +2 Total assist Locomotion: Ambulation: 1: Two helpers  Comprehension Comprehension Mode: Auditory Comprehension: 4-Understands basic 75 - 89% of the time/requires cueing 10 - 24% of the time  Expression Expression Mode: Verbal Expression: 3-Expresses basic 50 - 74% of the time/requires cueing 25 - 50% of the time. Needs to repeat parts of sentences.  Social Interaction Social Interaction: 3-Interacts appropriately 50 - 74% of the time - May be physically or verbally inappropriate.  Problem Solving Problem Solving: 2-Solves basic 25 - 49% of the time - needs direction more than half the time to initiate, plan or complete simple activities  Memory Memory: 3-Recognizes or recalls 50 - 74% of the time/requires cueing 25 - 49% of the time  Medical Problem List and Plan: 1. Functional deficits secondary to right ACA infarct embolic secondary to unknown source 2.  DVT Prophylaxis/Anticoagulation: Subcutaneous Lovenox. Monitor platelet counts of any signs of bleeding. Venous Doppler studies negative 3. Pain Management: Tylenol as needed 4. Dysphagia/left side inattention. Mechanical soft diet. Follow-up speech therapy 5. Neuropsych: This patient is capable of making decisions on his own behalf.titrate ritalin for attn ,  concentration and initiation 6. Skin/Wound Care: nutrition/skin checks 7. Fluids/Electrolytes/Nutrition: encourage po. Checking labs 8. Hypertension with poor medical compliance. Norvasc 10 mg daily, Coreg 6.25 mg twice a day, lisinopril 40 mg daily.   Increased hydrochlorothiazide 25 mg per day, poor fluid intake BUN/Creat climbing will reduce 9. Urine drug screen positive for marijuana. Counseling 10. Hyperlipidemia. Lipitor 11.UTI positive UA,>100K GNR  ecoli sens to Keflex, will d/c IVF, urine looks clear LOS (Days) 19 A FACE TO FACE EVALUATION WAS PERFORMED  [name] Short E 11/24/2014, 8:54 AM

## 2014-11-24 NOTE — Progress Notes (Signed)
Occupational Therapy Session Note  Patient Details  Name: [name] Short MRN: [id number] Date of Birth: [birth year]  Today's Date: 11/24/2014 OT Individual Time: 0830-1000 OT Individual Time Calculation (min): 90 min    Short Term Goals: Week 3:  OT Short Term Goal 1 (Week 3): Pt will complete transfer on/off toilet with mod assist of one caregiver OT Short Term Goal 2 (Week 3): Pt will complete LB dressing with max assist OT Short Term Goal 3 (Week 3): Pt will complete bathing at sit > stand level with min assist of 1 caregiver OT Short Term Goal 4 (Week 3): Pt will complete shower transfer with mod assist of one caregiver  Skilled Therapeutic Interventions/Progress Updates:    Engaged in ADL retraining with focus on transfers, sit <> stand, and functional use of LUE during self-care tasks.  Pt eating breakfast upon arrival, requiring increased time with initiation with self-feeding with minimal difference in pace and initiation noted when providing cues for encouragement.  Bathing completed in room shower, squat pivot w/c > tub transfer bench to Rt with mod assist with use of grab bars to promote weight shift.  Pt with decreased sustained attention and decreased sequencing with bathing this session, therapist providing cues for sequencing with pt still requiring increased time and often hand over hand assist to initiate task.  Min assist sit > stand and min-mod assist to maintain standing balance while pt washed buttocks.  Total assist transfer to Lt out of shower with pt unable to weight shift to complete squat pivot transfer requiring standing with total assist to advance LLE while providing trunk support and eliciting weight shift.  Donned pants with crossing each leg over the opposite knee, with pt requiring assist to position and maintain position of LLE.  Pt returned to bed at end of session as pt not wanting to remain seated in w/c, all needs within reach.  Therapy Documentation Precautions:   Precautions Precautions: Fall Precaution Comments: Rt gaze preference, decreased attention to Lt Restrictions Weight Bearing Restrictions: No General:   Vital Signs: Therapy Vitals Temp: 99.4 F (37.4 C) Temp Source: Oral Pulse Rate: (!) 104 Resp: 18 BP: 95/67 mmHg Patient Position (if appropriate): Sitting Oxygen Therapy SpO2: 99 % O2 Device: Not Delivered Pain:  Pt with c/o pain in back and Rt ribs, repositioned.  See FIM for current functional status  Therapy/Group: Individual Therapy  Rosalio LoudHOXIE, Rosmarie Esquibel 11/24/2014, 2:29 PM

## 2014-11-24 NOTE — Progress Notes (Signed)
Physical Therapy Session Note  Patient Details  Name: [name] Short MRN: [id number] Date of Birth: Oct 13, 1965  Today's Date: 11/24/2014 PT Individual Time: 1400-1500 PT Individual Time Calculation (min): 60 min   Short Term Goals: Week 3:  PT Short Term Goal 1 (Week 3): Pt will perform all aspects of bed mobility with HOB flat and with rail at mod A with 50% cues.  PT Short Term Goal 2 (Week 3): Pt will perform squat pivot transfers R or L with mod A of single therapist with 50% cues.  PT Short Term Goal 3 (Week 3): Pt will perform dynamic standing balance tasks without UE support at mod A and 50% cues.  PT Short Term Goal 4 (Week 3): Pt will perform gait x 25' w/ LRAD and max A with 75% cues and +2 for chair follow  Skilled Therapeutic Interventions/Progress Updates:   Skilled session focused on functional transfers in ADL apt to bed to simulate home environment, car transfer to simulated home car height, and gait via "three muskatter style" in order to address truncal NMR, as well as upright posture, midline orientation, and NMR through LLE.  During tasks, also continue to address decreased initiation and sustained attention to tasks, esp in mildly distracting environment.  [name] very internally and externally distracted during all sessions and requires up to max A cues to continue to follow through with task.  Performed w/c<>bed transfers at mod A level with continued facilitation for forward weight shift and trunk lean, mod to max A for bed mobility with max verbal cues for technique.  Pt also requires max A cues to assist with w/c set up prior to and following transfers and was unable to verbalize during session.  Performed car transfer at mod A level with same facilitation and cues as above.  Ended session with gait x 120' back to room via "three muskateer style" in order to facilitate posture and NMR as stated above.  Tolerated well with minimal c/o fatigue.  [name],  [name]  Left in bed with bed alarm set and all needs in reach.    Continue to attempt to discuss D/C plans with daughter and pt, however feel that pt does not have the awareness to state that he cannot return home and daughter is aware she is unable to provide care.    Therapy Documentation Precautions:  Precautions Precautions: Fall Precaution Comments: Rt gaze preference, decreased attention to Lt Restrictions Weight Bearing Restrictions: No   Vital Signs: Therapy Vitals Temp: 99.4 F (37.4 C) Temp Source: Oral Pulse Rate: (!) 104 Resp: 18 BP: 95/67 mmHg Patient Position (if appropriate): Sitting Oxygen Therapy SpO2: 99 % O2 Device: Not Delivered Pain: Pain Assessment Pain Assessment: No/denies pain   Locomotion : Ambulation Ambulation/Gait Assistance: 1: +2 Total assist   See FIM for current functional status  Therapy/Group: Individual Therapy  Vista Deckarcell, Adonay Scheier Ann 11/24/2014, 4:55 PM

## 2014-11-25 ENCOUNTER — Inpatient Hospital Stay (HOSPITAL_COMMUNITY): Payer: Self-pay | Admitting: Speech Pathology

## 2014-11-25 ENCOUNTER — Inpatient Hospital Stay (HOSPITAL_COMMUNITY): Payer: Medicaid Other | Admitting: Occupational Therapy

## 2014-11-25 ENCOUNTER — Inpatient Hospital Stay (HOSPITAL_COMMUNITY): Payer: Self-pay | Admitting: Rehabilitation

## 2014-11-25 MED ORDER — DICLOFENAC SODIUM 1 % TD GEL
2.0000 g | Freq: Four times a day (QID) | TRANSDERMAL | Status: DC
  Administered 2014-11-25 – 2014-12-02 (×25): 2 g via TOPICAL
  Filled 2014-11-25: qty 100

## 2014-11-25 MED ORDER — LISINOPRIL 20 MG PO TABS
20.0000 mg | ORAL_TABLET | Freq: Every day | ORAL | Status: DC
  Administered 2014-11-26 – 2014-12-03 (×8): 20 mg via ORAL
  Filled 2014-11-25 (×9): qty 1

## 2014-11-25 MED ORDER — HYDROCERIN EX CREA
TOPICAL_CREAM | Freq: Two times a day (BID) | CUTANEOUS | Status: DC
  Administered 2014-11-25 – 2014-12-02 (×15): via TOPICAL
  Filled 2014-11-25: qty 113

## 2014-11-25 MED ORDER — METHYLPHENIDATE HCL 5 MG PO TABS
5.0000 mg | ORAL_TABLET | Freq: Two times a day (BID) | ORAL | Status: DC
  Administered 2014-11-25 – 2014-11-27 (×4): 5 mg via ORAL
  Filled 2014-11-25 (×4): qty 1

## 2014-11-25 NOTE — Progress Notes (Signed)
Speech Language Pathology Daily Session Note  Patient Details  Name: [name] Short MRN: [id number] Date of Birth: 13-Nov-1965  Today's Date: 11/25/2014 SLP Individual Time: 1001-1033 SLP Individual Time Calculation (min): 32 min  Short Term Goals: Week 3: SLP Short Term Goal 1 (Week 3): Pt will tolerate dys 3 textures and thin liquids with supervision cues for use of rate and portion control for three consecutive sessions prior to diet advancement.   SLP Short Term Goal 2 (Week 3): Pt will sustain attention to a functional task for 5-10 minutes with min A verbal and tactile cues. SLP Short Term Goal 3 (Week 3): Pt will attend to the L side of the environment during a basic functional task with supervision. SLP Short Term Goal 4 (Week 3): Pt will identify 2 cognitive deficits and 2 physical deficits with mod A question cues. SLP Short Term Goal 5 (Week 3): Pt will complete basic functional problem solving tasks with min assist verbal and visual cues.    Skilled Therapeutic Interventions:  Pt was seen for skilled ST targeting cognitive goals.  Upon arrival, pt was seated upright in wheelchair, awake, lethargic, but agreeable to participate in ST with encouragement.  SLP facilitated the session with a basic, novel card game activity targeting sustained attention, initiation, and functional problem solving.  Pt required mod-max assist verbal and visual cues for initiation; however, once he began participating in task he was able to locate matches appropriately with min assist verbal cues in a quiet, minimally distracting environment.  Pt recalled at least one event from yesterday's therapy session with mod question cues and increased time.  He presented with slowly improving response latency; however, he continues to present with decreased initiation for functional communication.  Upon return to room, pt presented with complaints of back pain.  SLP encouraged pt to sit up in the chair versus going back to  bed and provided education about requesting pain medications for back pain.  Pt refused to use the call bell to request meds, stating that meds don't help despite extensive education from SLP.  RN made aware.   FIM:  Comprehension Comprehension Mode: Auditory Comprehension: 4-Understands basic 75 - 89% of the time/requires cueing 10 - 24% of the time Expression Expression Mode: Verbal Expression: 2-Expresses basic 25 - 49% of the time/requires cueing 50 - 75% of the time. Uses single words/gestures. Social Interaction Social Interaction: 3-Interacts appropriately 50 - 74% of the time - May be physically or verbally inappropriate. Problem Solving Problem Solving: 2-Solves basic 25 - 49% of the time - needs direction more than half the time to initiate, plan or complete simple activities Memory Memory: 2-Recognizes or recalls 25 - 49% of the time/requires cueing 51 - 75% of the time  Pain Pain Assessment Pain Assessment: Faces Faces Pain Scale: Hurts little more Pain Type: Acute pain Pain Location: Back Pain Orientation: Lower Pain Descriptors / Indicators: Aching Pain Intervention(s): RN made aware;Repositioned  Therapy/Group: Individual Therapy  Triniti Gruetzmacher, [name] 11/25/2014, 12:19 PM

## 2014-11-25 NOTE — Progress Notes (Signed)
Speech Language Pathology Daily Session Note  Patient Details  Name: [name] Short MRN: [id number] Date of Birth: Apr 06, 1966  Today's Date: 11/25/2014 SLP Individual Time: 1505-1530 SLP Individual Time Calculation (min): 25 min  Short Term Goals: Week 3: SLP Short Term Goal 1 (Week 3): Pt will tolerate dys 3 textures and thin liquids with supervision cues for use of rate and portion control for three consecutive sessions prior to diet advancement.   SLP Short Term Goal 2 (Week 3): Pt will sustain attention to a functional task for 5-10 minutes with min A verbal and tactile cues. SLP Short Term Goal 3 (Week 3): Pt will attend to the L side of the environment during a basic functional task with supervision. SLP Short Term Goal 4 (Week 3): Pt will identify 2 cognitive deficits and 2 physical deficits with mod A question cues. SLP Short Term Goal 5 (Week 3): Pt will complete basic functional problem solving tasks with min assist verbal and visual cues.    Skilled Therapeutic Interventions:  Pt was seen for skilled ST targeting goals for basic, familiar problem solving during self care tasks.  Upon arrival, pt was seated upright in wheelchair, awake, alert, with complaints of something stuck in his mouth.  Upon examination, pt was noted with part of a pill left in his mouth that was stuck to the right side of his tongue.  Pt required max assist instructional cues to problem solve how to clear pill from the oral cavity.  Pt eventually agreed to using pureed boluses to clear residue from the oral cavity post swallow.  SLP also provided min assist verbal cues and set up to facilitate sequencing, functional problem solving, and initiation of oral care after pureed boluses cleared pill from oral cavity.  Pt was left upright in wheelchair with call bell left within reach, quick release belt donned.    FIM:  Comprehension Comprehension Mode: Auditory Comprehension: 4-Understands basic 75 - 89% of the  time/requires cueing 10 - 24% of the time Expression Expression Mode: Verbal Expression: 2-Expresses basic 25 - 49% of the time/requires cueing 50 - 75% of the time. Uses single words/gestures. Social Interaction Social Interaction: 3-Interacts appropriately 50 - 74% of the time - May be physically or verbally inappropriate. Problem Solving Problem Solving: 2-Solves basic 25 - 49% of the time - needs direction more than half the time to initiate, plan or complete simple activities Memory Memory: 2-Recognizes or recalls 25 - 49% of the time/requires cueing 51 - 75% of the time FIM - Eating Eating Activity: 5: Supervision/cues  Pain Pain Assessment Pain Assessment: No/denies pain  Therapy/Group: Individual Therapy  Frady Taddeo, [name] 11/25/2014, 4:21 PM

## 2014-11-25 NOTE — Progress Notes (Signed)
Occupational Therapy Session Note  Patient Details  Name: [name] Short Date of Birth: March 21, 1966  Today's Date: 11/25/2014 OT Individual Time: 4403-47420830-0930 and 1440-1505 OT Individual Time Calculation (min): 60 min and 25 min   Short Term Goals: Week 3:  OT Short Term Goal 1 (Week 3): Pt will complete transfer on/off toilet with mod assist of one caregiver OT Short Term Goal 2 (Week 3): Pt will complete LB dressing with max assist OT Short Term Goal 3 (Week 3): Pt will complete bathing at sit > stand level with min assist of 1 caregiver OT Short Term Goal 4 (Week 3): Pt will complete shower transfer with mod assist of one caregiver  Skilled Therapeutic Interventions/Progress Updates:    1) Engaged in ADL retraining with focus on transfers, sit <> stand, and increased initiation with self-care tasks.  Pt received on toilet with nurse tech present.  Performed sit > stand with mod-max assist for weight shift and +2 for hygiene with therapist providing tactile cues to maintain upright standing balance.  Squat pivot max assist to Lt to w/c with focus on weight shift and initiation.  Bathing and dressing completed at sit> stand level at sink with pt able to don shirt without assistance with increased time and by donning Rt sleeve first, head, then Lt arm.  Pt able to thread both pant legs this session with increased time and assist to position and maintain LLE over Rt knee.  Provided increased wait time for initiation and intermittent cues throughout to increase participation.  2) Treatment session with focus on transfers and sustained attention to task.  Pt on toilet upon arrival with nurse tech present.  Completed squat pivot transfer toilet to w/c to Lt with max assist with tactile cues to promote forward weight shift and counting to 3 to increase initiation.  +2 for hygiene at sit > stand level, with pt more distracted with 2 people present as focusing on the one not giving him  instruction.  Planned to modify w/c cushion with wedge to decrease Rt trunk flexion/shortening in sitting with pt refusing stating "just throw the cushion out".  Discussed pressure relieving cushion and OOB tolerance with discussion with RN to return pt to bed at end of SLP session immediately following this session.  Therapy Documentation Precautions:  Precautions Precautions: Fall Precaution Comments: Rt gaze preference, decreased attention to Lt Restrictions Weight Bearing Restrictions: No Pain: Pain Assessment Pain Assessment: 0-10 Pain Location: Leg Pain Orientation: Right Pain Intervention(s): Medication (See eMAR)  See FIM for current functional status  Therapy/Group: Individual Therapy  Rosalio LoudHOXIE, Lichelle Viets 11/25/2014, 9:31 AM

## 2014-11-25 NOTE — Progress Notes (Signed)
Social Work Patient ID: [name] Short, male   DOB: Dec 15, 1965, 49 y.o.   MRN: [id number] Met with pt, daughter and girlfriend to inform of team conference progress in therapies and the need if taking him home to begin family training. Discharge still 5/12.. All will come in and begin training, unsure if realize how Much care pt requires and how hard it will be.  Girlfriend is an CNA and does this all day.  Concern pt is not taking as much as when he first came to rehab. Made aware starting back on ritalin and UTI finished being Treated.  Will see how they do and discuss if it is a realistic option to take pt home.

## 2014-11-25 NOTE — Progress Notes (Signed)
Physical Therapy Session Note  Patient Details  Name: [name] Short MRN: [id number] Date of Birth: 09/14/1965  Today's Date: 11/25/2014 PT Individual Time: 1300-1400 PT Individual Time Calculation (min): 60 min   Short Term Goals: Week 3:  PT Short Term Goal 1 (Week 3): Pt will perform all aspects of bed mobility with HOB flat and with rail at mod A with 50% cues.  PT Short Term Goal 2 (Week 3): Pt will perform squat pivot transfers R or L with mod A of single therapist with 50% cues.  PT Short Term Goal 3 (Week 3): Pt will perform dynamic standing balance tasks without UE support at mod A and 50% cues.  PT Short Term Goal 4 (Week 3): Pt will perform gait x 25' w/ LRAD and max A with 75% cues and +2 for chair follow  Skilled Therapeutic Interventions/Progress Updates:   Skilled session focused on hands on family training with girlfriend and daughter [name] Short in order to have safe D/C home.  Also continued to have frank discussion on amount of care that pt will require at home, the fact that he will need this care 24/7 for multiple transfers a day, and the benefits of going to a SNF for continued therapy in order to improve all aspects of mobility and cognition for safe D/C home.  Also continue to discuss pt extreme limitations due to cognitive deficits with awareness issues, decreased focused and sustained attention, decreased memory, and decreased initiation.  PT demonstrated and educated on how to perform bed mobility and squat pivot transfers bed<>w/c.  Had girlfriend return demonstration, however she requires max verbal cues on performing squat vs stand pivot as well as education on importance of keeping pts mass as low as possible for increased safety.   Note that pt requires total A with girlfriend assisting and did not assist nearly as much.  Provided max education about communication and allowing pt to initiate movement, otherwise pt and caregiver will be at risk for injury.  GF verbalized  understanding.  Daughter then performed task with more mod A with pt showing increased initiation during daughter's transfer.  Pts discrepancies between caregivers likely to be behavioral in nature and gf aware of this as well.  Ended session with car transfer to simulated home car height.  Performed at max A level with girlfriend, again with mod to max verbal cues on hand placement, body mechanics, verbal cues to pt and sequencing of transfer (throughout session with gf and daughter).  Assisted pt back to room and left in w/c with quick release belt donned and all needs in reach.   Therapy Documentation Precautions:  Precautions Precautions: Fall Precaution Comments: Rt gaze preference, decreased attention to Lt Restrictions Weight Bearing Restrictions: No   Vital Signs: Therapy Vitals Temp: 98.5 F (36.9 C) Temp Source: Oral Pulse Rate: 68 Resp: 18 BP: 119/69 mmHg Patient Position (if appropriate): Lying Oxygen Therapy SpO2: 100 % O2 Device: Not Delivered Pain: Pain Assessment Pain Assessment: Faces Faces Pain Scale: Hurts little more Pain Type: Acute pain Pain Location: Back Pain Orientation: Lower Pain Descriptors / Indicators: Aching Pain Intervention(s): RN made aware;Repositioned    See FIM for current functional status  Therapy/Group: Individual Therapy  Vista Deckarcell, Tykisha Areola Ann 11/25/2014, 2:59 PM

## 2014-11-25 NOTE — Progress Notes (Signed)
Subjective/Complaints: Right knee pain, points to the area above the patella, no falls Review of systems unable to obtain secondary to cognitive status Objective: Vital Signs: Blood pressure 126/72, pulse 54, temperature 98.1 F (36.7 C), temperature source Oral, resp. rate 18, weight 91.4 kg (201 lb 8 oz), SpO2 100 %. No results found. Results for orders placed or performed during the hospital encounter of 11/05/14 (from the past 72 hour(s))  Basic metabolic panel     Status: Abnormal   Collection Time: 11/23/14 11:45 AM  Result Value Ref Range   Sodium 134 (L) 135 - 145 mmol/L   Potassium 3.7 3.5 - 5.1 mmol/L   Chloride 104 101 - 111 mmol/L   CO2 20 (L) 22 - 32 mmol/L   Glucose, Bld 102 (H) 70 - 99 mg/dL   BUN 57 (H) 6 - 20 mg/dL   Creatinine, Ser 1.89 (H) 0.61 - 1.24 mg/dL   Calcium 9.3 8.9 - 10.3 mg/dL   GFR calc non Af Amer 41 (L) >60 mL/min   GFR calc Af Amer 47 (L) >60 mL/min    Comment: (NOTE) The eGFR has been calculated using the CKD EPI equation. This calculation has not been validated in all clinical situations. eGFR's persistently <90 mL/min signify possible Chronic Kidney Disease.    Anion gap 10 5 - 15  Basic metabolic panel     Status: Abnormal   Collection Time: 11/24/14  6:20 AM  Result Value Ref Range   Sodium 134 (L) 135 - 145 mmol/L   Potassium 3.6 3.5 - 5.1 mmol/L   Chloride 103 101 - 111 mmol/L   CO2 19 (L) 22 - 32 mmol/L   Glucose, Bld 88 70 - 99 mg/dL   BUN 44 (H) 6 - 20 mg/dL   Creatinine, Ser 1.51 (H) 0.61 - 1.24 mg/dL   Calcium 9.1 8.9 - 10.3 mg/dL   GFR calc non Af Amer 53 (L) >60 mL/min   GFR calc Af Amer >60 >60 mL/min    Comment: (NOTE) The eGFR has been calculated using the CKD EPI equation. This calculation has not been validated in all clinical situations. eGFR's persistently <90 mL/min signify possible Chronic Kidney Disease.    Anion gap 12 5 - 15     HEENT: normal and extensive gold caps Cardio: RRR and no murmurs Resp:  CTA B/L and unlabored GI: BS positive and nontender nondistended Extremity:  No Edema, IV site non tender no erythema or drainage Skin:   Intact Neuro: Lethargic, Flat, Abnormal  Delayed responses Sensory reduced sensation in the left lower extremity greater than left upper extremity to pinch, difficult to assess secondary to affect, Abnormal Motor 5/5 in the right deltoid, biceps, triceps, grip, hip flexor, knee extensor, ankle dorsiflexor and plantar flexor and Inattention 0/5 in the left deltoid, 2 minus biceps triceps,2 minus finger flexors, 0/5 and left hip flexor knee extensor and ankle dorsiflexor Gen. no acute distress   Assessment/Plan: 1. Functional deficits secondary to right ACA infarct with left hemiparesis as well as cognitive deficits which require 3+ hours per day of interdisciplinary therapy in a comprehensive inpatient rehab setting. Physiatrist is providing close team supervision and 24 hour management of active medical problems listed below. Physiatrist and rehab team continue to assess barriers to discharge/monitor patient progress toward functional and medical goals.  FIM: FIM - Bathing Bathing Steps Patient Completed: Chest, Left Arm, Abdomen, Front perineal area, Right upper leg, Left upper leg Bathing: 3: Mod-Patient completes 5-7 [id number]  parts or 50-74%  FIM - Upper Body Dressing/Undressing Upper body dressing/undressing steps patient completed: Thread/unthread right sleeve of pullover shirt/dresss, Thread/unthread left sleeve of pullover shirt/dress, Put head through opening of pull over shirt/dress, Pull shirt over trunk Upper body dressing/undressing: 5: Set-up assist to: Obtain clothing/put away FIM - Lower Body Dressing/Undressing Lower body dressing/undressing steps patient completed: Thread/unthread right pants leg, Thread/unthread left pants leg Lower body dressing/undressing: 2: Max-Patient completed 25-49% of tasks  FIM - Toileting Toileting steps  completed by patient: Performs perineal hygiene Toileting: 1: Two helpers  FIM - Radio producer Devices: Product manager Transfers: 2-From toilet/BSC: Max A (lift and lower assist)  FIM - Bed/Chair Transfer Bed/Chair Transfer Assistive Devices: Bed rails, Arm rests Bed/Chair Transfer: 3: Sit > Supine: Mod A (lifting assist/Pt. 50-74%/lift 2 legs), 3: Supine > Sit: Mod A (lifting assist/Pt. 50-74%/lift 2 legs, 3: Bed > Chair or W/C: Mod A (lift or lower assist), 3: Chair or W/C > Bed: Mod A (lift or lower assist)  FIM - Locomotion: Wheelchair Distance: 100 Locomotion: Wheelchair: 0: Activity did not occur FIM - Locomotion: Ambulation Locomotion: Ambulation Assistive Devices: Other (comment) ("three muskateer style") Ambulation/Gait Assistance: 1: +2 Total assist Locomotion: Ambulation: 1: Two helpers  Comprehension Comprehension Mode: Auditory Comprehension: 4-Understands basic 75 - 89% of the time/requires cueing 10 - 24% of the time  Expression Expression Mode: Verbal Expression: 3-Expresses basic 50 - 74% of the time/requires cueing 25 - 50% of the time. Needs to repeat parts of sentences.  Social Interaction Social Interaction: 3-Interacts appropriately 50 - 74% of the time - May be physically or verbally inappropriate.  Problem Solving Problem Solving: 2-Solves basic 25 - 49% of the time - needs direction more than half the time to initiate, plan or complete simple activities  Memory Memory: 3-Recognizes or recalls 50 - 74% of the time/requires cueing 25 - 49% of the time  Medical Problem List and Plan: 1. Functional deficits secondary to right ACA infarct embolic secondary to unknown source 2.  DVT Prophylaxis/Anticoagulation: Subcutaneous Lovenox. Monitor platelet counts of any signs of bleeding. Venous Doppler studies negative 3. Pain Management: Tylenol as needed 4. Dysphagia/left side inattention. Mechanical soft diet. Follow-up speech  therapy 5. Neuropsych: This patient is capable of making decisions on his own behalf.titrate ritalin for attn , concentration and initiation 6. Skin/Wound Care: nutrition/skin checks 7. Fluids/Electrolytes/Nutrition: encourage po. Checking labs 8. Hypertension with poor medical compliance. Norvasc 10 mg daily, Coreg 6.25 mg twice a day, lisinopril 40 mg daily.   Reduce lisnopril stop  HCTZ due to elevated BUN/Creat, IVF  monitor 9. Urine drug screen positive for marijuana. Counseling 10. Hyperlipidemia. Lipitor 11.UTI positive UA,>100K GNR  ecoli sens to Keflex, LOS (Days) 20 A FACE TO FACE EVALUATION WAS PERFORMED  KIRSTEINS,ANDREW E 11/25/2014, 9:47 AM

## 2014-11-25 NOTE — Patient Care Conference (Signed)
Inpatient RehabilitationTeam Conference and Plan of Care Update Date: 11/25/2014   Time: 10;30 AM    Patient Name: [name] Short      Medical Record Number: [id number]  Date of Birth: March 04, 1966 Sex: Male         Room/Bed: 4W11C/4W11C-01 Payor Info: Payor: MEDICAID PENDING / Plan: MEDICAID PENDING / Product Type: *No Product type* /    Admitting Diagnosis: R ACA infarct   Admit Date/Time:  11/05/2014  4:33 PM Admission Comments: No comment available   Primary Diagnosis:  <principal problem not specified> Principal Problem: <principal problem not specified>  Patient Active Problem List   Diagnosis Date Noted  . Secondary cardiomyopathy 11/05/2014  . Malignant hypertension 11/05/2014  . Hyperlipidemia LDL goal <70 11/05/2014  . Cigarette smoker 11/05/2014  . Marijuana abuse 11/05/2014  . Hypokalemia 11/05/2014  . Left hemiparesis 11/05/2014  . Cognitive deficit, post-stroke 11/05/2014  . Left-sided weakness   . Acute right anterior cerebral artery (ACA) ischemic stroke 10/31/2014    Expected Discharge Date: Expected Discharge Date: 12/03/14  Team Members Present: Physician leading conference: Dr. Claudette LawsAndrew Short Social Worker Present: Dossie DerBecky Pauletta Pickney, LCSW Nurse Present: Chana Bodeeborah Sharp, RN PT Present: Harriet ButteEmily Parcell, PT;Rodney Leo GrosserWishart, PT OT Present: Callie FieldingKatie Pittman, OT SLP Present: Jackalyn LombardNicole Page, SLP PPS Coordinator present : Tora DuckMarie Noel, RN, CRRN     Current Status/Progress Goal Weekly Team Focus  Medical   initiation reduce, family issues, ? support at home     retrial ritalin   Bowel/Bladder   Incontinent x 1 during the noc. Continent of bowel and bladder. LBM 11/23/14  Continent of bladder during the noc  Limit fluids after 7pm, timed toileting q 2-3@noc    Swallow/Nutrition/ Hydration   Dys 3 and thin liquids, full supervision for cognition   supervision with least restrictive diet   continue diet toleration and trials of advanced consistencies per improved mentation     ADL's   mod-max assist with bathing and dressing mostly due to decreased attention, mod assist squat pivot transfers, requires +2 with transfer out of shower to Lt.  Pt with decreased arousal and participation with decrease in verbalizations and slower initiation  min assist overall, mod assist toileting  transfers, standing balance, LUE NMR   Mobility   Pt is mod to max A bed mobility, mod A squat pivot transfers R and L, mod to max A (varies) for standing balance due to pusher tendencies.  Continues to be extremely flat and lethargic during PT sessions for past 3-4 days.  Also limited by severe cognitive and perceptual deficits.   min to mod A overall from w/c level  L NMR, transfers, decreasing pusher tendencies, midline orientation, gait, attention, pt/family education.    Communication   new onset of worsening dysarthria this week with UTI and increased fatigue, appears to be slowly clearing          Safety/Cognition/ Behavioral Observations  mod-max assist for basic cogintion  min-mod for basic   continue addressing left attention, initiation, sustained attention, basic problem solving    Pain   No c/o pain  <3  Assess for nonverbal cues of pain   Skin   Dry, flaky skin specifically to bilateral feet  No skin breakdown  Assist with turn q 2hrs while in bed      *See Care Plan and progress notes for long and short-term goals.  Barriers to Discharge: left neglect, poor initiation, Poor awareness    Possible Resolutions to Barriers:  See above, may need SNF  Discharge Planning/Teaching Needs:  Girlfriend and daughter have been in to participate in therapies and beginning to learn his care.  They plan to take him home do not want him in a NH.      Team Discussion:  Progress in slow and hampered by his cognition and perceptual deficits.  Pain in knee and back-treating with medicines UTI treated, restart ritalin was helping. Need to begin family education if taking pt home. Ques  depression  Revisions to Treatment Plan:  Ques if realistic plan home versus NHP   Continued Need for Acute Rehabilitation Level of Care: The patient requires daily medical management by a physician with specialized training in physical medicine and rehabilitation for the following conditions: Daily direction of a multidisciplinary physical rehabilitation program to ensure safe treatment while eliciting the highest outcome that is of practical value to the patient.: Yes Daily medical management of patient stability for increased activity during participation in an intensive rehabilitation regime.: Yes Daily analysis of laboratory values and/or radiology reports with any subsequent need for medication adjustment of medical intervention for : Neurological problems;Other  Lucy ChrisDupree, Journee Bobrowski G 11/25/2014, 3:44 PM

## 2014-11-26 ENCOUNTER — Ambulatory Visit (HOSPITAL_COMMUNITY): Payer: Self-pay | Admitting: Rehabilitation

## 2014-11-26 ENCOUNTER — Inpatient Hospital Stay (HOSPITAL_COMMUNITY): Payer: Medicaid Other | Admitting: Occupational Therapy

## 2014-11-26 ENCOUNTER — Encounter (HOSPITAL_COMMUNITY): Payer: Self-pay | Admitting: Speech Pathology

## 2014-11-26 NOTE — Progress Notes (Signed)
Occupational Therapy Session Note  Patient Details  Name: [name] Short MRN: [id number] Date of Birth: [birth year]  Today's Date: 11/26/2014 OT Individual Time: 1105-1205 and 1400-1430 OT Individual Time Calculation (min): 60 min and 30 min   Short Term Goals: Week 3:  OT Short Term Goal 1 (Week 3): Pt will complete transfer on/off toilet with mod assist of one caregiver OT Short Term Goal 2 (Week 3): Pt will complete LB dressing with max assist OT Short Term Goal 3 (Week 3): Pt will complete bathing at sit > stand level with min assist of 1 caregiver OT Short Term Goal 4 (Week 3): Pt will complete shower transfer with mod assist of one caregiver  Skilled Therapeutic Interventions/Progress Updates:    1) Engaged in family education with focus on hemi-technique with bathing and dressing.  Pt's significant other and daughter were present to learn how to assist with self-care tasks and bathroom transfers.  Squat pivot transfer bed > w/c with mod assist with requiring tactile cues for forward weight shift and counting for initiation.  Pt extremely distracted this session with having multiple people present.  Educated on pt's pt extreme limitations due to cognitive deficits with awareness issues, decreased focused and sustained attention, decreased memory, and decreased initiation.Pt required redirection with UB dressing with cues for appropriate technique to increase success and safety.  Educated significant other on sit > stand for LB hygiene and dressing with therapist demonstrating technique with donning brief and then having significant other return demonstrate with pants.  Note that pt initially required total assist sit > stand with girlfriend, educated on returning pt to seated and communicating and allowing pt to initiate during sit > stand and transfers with pt's 2nd stand more of a mod assist with girlfriend.  Pt's girlfriend with questions about showering with education on difficult transfer  especially when pt wet.  In ADL apt, therapist demonstrated squat pivot transfer w/c > tub transfer bench while discussing importance of forward weight shift and keeping pt's body mass low for safety.  Pt required max assist when transferring to Lt and mod assist transferring back to Rt.  Pt's girlfriend looked shocked and reports "I don't think he'll be showering".  Recommend further family education focusing on sit <> stand with self-care tasks as [facility], and deferring tub/shower to [facility].  Again discussed amount of care that pt will require at home with 24/7 and multiple transfers per day.  2)  Therapeutic activity with focus on Rt trunk elongation.  Pt's family leaving upon arrival, stating they will be here tomorrow for further education.  In therapy gym engaged in passive and active stretching of Rt trunk in sidelying to promote elongation as pt tends to sit with Rt trunk shortened and flexed and is reporting rib and back pain.  Positioned pt in sidelying on Lt over bolster with stretching at ribcage and pelvis to promote elongation, followed by active reaching with RUE overhead to elicit active stretch of trunk.  Pt required increased time and cues to continue with reaching task as he would start to drift off to sleep or lose focus on task.  Pt able to retrieve 2 items at most in sequence without requiring further cues.  Pt returned to room and to bed as reporting fatigue.  Pt left in sidelying in bed with all needs in reach.  Therapy Documentation Precautions:  Precautions Precautions: Fall Precaution Comments: Rt gaze preference, decreased attention to Lt Restrictions Weight Bearing Restrictions: No Pain: Pain  Assessment Pain Assessment: No/denies pain  See FIM for current functional status  Therapy/Group: Individual Therapy  Rosalio LoudHOXIE, Jency Schnieders 11/26/2014, 3:41 PM

## 2014-11-26 NOTE — Progress Notes (Signed)
Physical Therapy Session Note  Patient Details  Name: [name] Short MRN: [id number] Date of Birth: [birth year]  Today's Date: 11/26/2014 PT Individual Time: 1300-1400 PT Individual Time Calculation (min): 60 min   Short Term Goals: Week 3:  PT Short Term Goal 1 (Week 3): Pt will perform all aspects of bed mobility with HOB flat and with rail at mod A with 50% cues.  PT Short Term Goal 2 (Week 3): Pt will perform squat pivot transfers R or L with mod A of single therapist with 50% cues.  PT Short Term Goal 3 (Week 3): Pt will perform dynamic standing balance tasks without UE support at mod A and 50% cues.  PT Short Term Goal 4 (Week 3): Pt will perform gait x 25' w/ LRAD and max A with 75% cues and +2 for chair follow  Skilled Therapeutic Interventions/Progress Updates:    Skilled session continues to focus on hands on family training with girlfriend and daughter [name] Short in order to have safe D/C home. Also continued to have frank discussion on amount of care that pt will require at home, the fact that he will need this care 24/7 for multiple transfers a day, etc, however gf still wishes to take pt home. Also continue to discuss pt extreme limitations due to cognitive deficits with awareness issues, decreased focused and sustained attention, decreased memory, and decreased initiation. PT demonstrated and educated on how to perform bed mobility and squat pivot transfers bed<>w/c. Had girlfriend and daughter return demonstration, however she requires max verbal cues on performing squat pivot as well as education on importance of keeping pts mass as low as possible for increased safety.Pt seemed to have slightly improved affect today, however all three require mod cues to attend to task and to decrease amount of joking throughout.  Pt continues to require anywhere from mod to max A level of transfer with both gf and daughter.  Ended session with car transfer to simulated home car height. Performed at  max A level with girlfriend, again with mod to max verbal cues on hand placement, body mechanics, verbal cues to pt and sequencing of transfer (throughout session with gf and daughter). Assisted pt back to room and left in w/c with quick release belt donned and all needs in reach.   Therapy Documentation Precautions:  Precautions Precautions: Fall Precaution Comments: Rt gaze preference, decreased attention to Lt Restrictions Weight Bearing Restrictions: No   Vital Signs: Therapy Vitals Temp: 98.3 F (36.8 C) Temp Source: Oral Pulse Rate: 64 Resp: 18 BP: 115/73 mmHg Patient Position (if appropriate): Lying Oxygen Therapy SpO2: 100 % O2 Device: Not Delivered Pain: Pt with no c/o pain during session.   See FIM for current functional status  Therapy/Group: Individual Therapy  Vista Deckarcell, Cully Luckow Ann 11/26/2014, 5:45 PM

## 2014-11-26 NOTE — Plan of Care (Signed)
Problem: RH BOWEL ELIMINATION Goal: RH STG MANAGE BOWEL W/MEDICATION W/ASSISTANCE STG Manage Bowel with Medication with Assistance. Mod assist  Goal changed to mod. assist

## 2014-11-26 NOTE — Plan of Care (Signed)
Problem: RH BOWEL ELIMINATION Goal: RH STG MANAGE BOWEL W/MEDICATION W/ASSISTANCE STG Manage Bowel with Medication with Assistance. Mod I  Goal changed to mod. assist

## 2014-11-26 NOTE — Progress Notes (Signed)
Subjective/Complaints: No knee pain, responses very delayed, Ritalin taken just before visit Review of systems unable to obtain secondary to cognitive status Objective: Vital Signs: Blood pressure 117/67, pulse 66, temperature 98.6 F (37 C), temperature source Oral, resp. rate 18, weight 91.4 kg (201 lb 8 oz), SpO2 98 %. No results found. Results for orders placed or performed during the hospital encounter of 11/05/14 (from the past 72 hour(s))  Basic metabolic panel     Status: Abnormal   Collection Time: 11/23/14 11:45 AM  Result Value Ref Range   Sodium 134 (L) 135 - 145 mmol/L   Potassium 3.7 3.5 - 5.1 mmol/L   Chloride 104 101 - 111 mmol/L   CO2 20 (L) 22 - 32 mmol/L   Glucose, Bld 102 (H) 70 - 99 mg/dL   BUN 57 (H) 6 - 20 mg/dL   Creatinine, Ser 1.89 (H) 0.61 - 1.24 mg/dL   Calcium 9.3 8.9 - 10.3 mg/dL   GFR calc non Af Amer 41 (L) >60 mL/min   GFR calc Af Amer 47 (L) >60 mL/min    Comment: (NOTE) The eGFR has been calculated using the CKD EPI equation. This calculation has not been validated in all clinical situations. eGFR's persistently <90 mL/min signify possible Chronic Kidney Disease.    Anion gap 10 5 - 15  Basic metabolic panel     Status: Abnormal   Collection Time: 11/24/14  6:20 AM  Result Value Ref Range   Sodium 134 (L) 135 - 145 mmol/L   Potassium 3.6 3.5 - 5.1 mmol/L   Chloride 103 101 - 111 mmol/L   CO2 19 (L) 22 - 32 mmol/L   Glucose, Bld 88 70 - 99 mg/dL   BUN 44 (H) 6 - 20 mg/dL   Creatinine, Ser 1.51 (H) 0.61 - 1.24 mg/dL   Calcium 9.1 8.9 - 10.3 mg/dL   GFR calc non Af Amer 53 (L) >60 mL/min   GFR calc Af Amer >60 >60 mL/min    Comment: (NOTE) The eGFR has been calculated using the CKD EPI equation. This calculation has not been validated in all clinical situations. eGFR's persistently <90 mL/min signify possible Chronic Kidney Disease.    Anion gap 12 5 - 15     HEENT: normal and extensive gold caps Cardio: RRR and no murmurs Resp:  CTA B/L and unlabored GI: BS positive and nontender nondistended Extremity:  No Edema, IV site non tender no erythema or drainage Skin:   Intact Neuro: Lethargic, Flat, Abnormal  Delayed responses Sensory reduced sensation in the left lower extremity greater than left upper extremity to pinch, difficult to assess secondary to affect, Abnormal Motor 5/5 in the right deltoid, biceps, triceps, grip, hip flexor, knee extensor, ankle dorsiflexor and plantar flexor and Inattention 0/5 in the left deltoid, 2 minus biceps triceps,2 minus finger flexors, 0/5 and left hip flexor knee extensor and ankle dorsiflexor Gen. no acute distress   Assessment/Plan: 1. Functional deficits secondary to right ACA infarct with left hemiparesis as well as cognitive deficits which require 3+ hours per day of interdisciplinary therapy in a comprehensive inpatient rehab setting. Physiatrist is providing close team supervision and 24 hour management of active medical problems listed below. Physiatrist and rehab team continue to assess barriers to discharge/monitor patient progress toward functional and medical goals. Methylphenidate retrial 5 mg thus far may have to bump up to 10 FIM: FIM - Bathing Bathing Steps Patient Completed: Chest, Left Arm, Abdomen, Front perineal area, Right upper  leg, Left upper leg Bathing: 3: Mod-Patient completes 5-7 [id number] parts or 50-74%  FIM - Upper Body Dressing/Undressing Upper body dressing/undressing steps patient completed: Thread/unthread right sleeve of pullover shirt/dresss, Thread/unthread left sleeve of pullover shirt/dress, Put head through opening of pull over shirt/dress, Pull shirt over trunk Upper body dressing/undressing: 5: Set-up assist to: Obtain clothing/put away FIM - Lower Body Dressing/Undressing Lower body dressing/undressing steps patient completed: Thread/unthread right pants leg, Thread/unthread left pants leg Lower body dressing/undressing: 2: Max-Patient  completed 25-49% of tasks  FIM - Toileting Toileting steps completed by patient: Performs perineal hygiene Toileting: 1: Two helpers  FIM - TRadio producerDevices: Grab bars Toilet Transfers: 2-From toilet/BSC: Max A (lift and lower assist)  FIM - Bed/Chair Transfer Bed/Chair Transfer Assistive Devices: Bed rails, Arm rests Bed/Chair Transfer: 3: Sit > Supine: Mod A (lifting assist/Pt. 50-74%/lift 2 legs), 3: Supine > Sit: Mod A (lifting assist/Pt. 50-74%/lift 2 legs, 3: Bed > Chair or W/C: Mod A (lift or lower assist), 3: Chair or W/C > Bed: Mod A (lift or lower assist)  FIM - Locomotion: Wheelchair Distance: 100 Locomotion: Wheelchair: 0: Activity did not occur FIM - Locomotion: Ambulation Locomotion: Ambulation Assistive Devices: Other (comment) ("three muskateer style") Ambulation/Gait Assistance: 1: +2 Total assist Locomotion: Ambulation: 1: Two helpers  Comprehension Comprehension Mode: Auditory Comprehension: 4-Understands basic 75 - 89% of the time/requires cueing 10 - 24% of the time  Expression Expression Mode: Verbal Expression: 3-Expresses basic 50 - 74% of the time/requires cueing 25 - 50% of the time. Needs to repeat parts of sentences.  Social Interaction Social Interaction: 3-Interacts appropriately 50 - 74% of the time - May be physically or verbally inappropriate.  Problem Solving Problem Solving: 2-Solves basic 25 - 49% of the time - needs direction more than half the time to initiate, plan or complete simple activities  Memory Memory: 3-Recognizes or recalls 50 - 74% of the time/requires cueing 25 - 49% of the time  Medical Problem List and Plan: 1. Functional deficits secondary to right ACA infarct embolic secondary to unknown source 2.  DVT Prophylaxis/Anticoagulation: Subcutaneous Lovenox. Monitor platelet counts of any signs of bleeding. Venous Doppler studies negative 3. Pain Management: Tylenol as needed 4.  Dysphagia/left side inattention. Mechanical soft diet. Follow-up speech therapy 5. Neuropsych: This patient is capable of making decisions on his own behalf.titrate ritalin for attn , concentration and initiation 6. Skin/Wound Care: nutrition/skin checks 7. Fluids/Electrolytes/Nutrition: encourage po. Checking labs 8. Hypertension with poor medical compliance. Norvasc 10 mg daily, Coreg 6.25 mg twice a day, lisinopril 40 mg daily.   Reduce lisnopril stop  HCTZ due to elevated BUN/Creat, IVF  Monitor, recheck be met in morning 9. Urine drug screen positive for marijuana. Counseling 10. Hyperlipidemia. Lipitor 11.UTI positive UA,>100K GNR  ecoli sens to Keflex,continue through Saturday 5/7 LOS (Days) 21 A FACE TO FACE EVALUATION WAS PERFORMED  KIRSTEINS,ANDREW E 11/26/2014, 10:14 AM

## 2014-11-26 NOTE — Progress Notes (Signed)
Speech Language Pathology Weekly Progress and Session Note  Patient Details  Name: [name] Short MRN: [id number] Date of Birth: 1966-03-26  Beginning of progress report period: November 19, 2014 End of progress report period: Nov 26, 2014  Today's Date: 11/26/2014 SLP Individual Time: 1010-1105 SLP Individual Time Calculation (min): 55 min  Short Term Goals: Week 3: SLP Short Term Goal 1 (Week 3): Pt will tolerate dys 3 textures and thin liquids with supervision cues for use of rate and portion control for three consecutive sessions prior to diet advancement.   SLP Short Term Goal 1 - Progress (Week 3): Progressing toward goal SLP Short Term Goal 2 (Week 3): Pt will sustain attention to a functional task for 5-10 minutes with min A verbal and tactile cues. SLP Short Term Goal 2 - Progress (Week 3): Progressing toward goal SLP Short Term Goal 3 (Week 3): Pt will attend to the L side of the environment during a basic functional task with supervision. SLP Short Term Goal 3 - Progress (Week 3): Progressing toward goal SLP Short Term Goal 4 (Week 3): Pt will identify 2 cognitive deficits and 2 physical deficits with mod A question cues. SLP Short Term Goal 4 - Progress (Week 3): Progressing toward goal SLP Short Term Goal 5 (Week 3): Pt will complete basic functional problem solving tasks with min assist verbal and visual cues.   SLP Short Term Goal 5 - Progress (Week 3): Met    New Short Term Goals: Week 4: SLP Short Term Goal 1 (Week 4): Pt will tolerate dys 3 textures and thin liquids with supervision cues for use of rate and portion control for three consecutive sessions prior to diet advancement.   SLP Short Term Goal 2 (Week 4): Pt will sustain attention to a functional task for 5-10 minutes with min A verbal and tactile cues. SLP Short Term Goal 3 (Week 4): Pt will attend to the L side of the environment during a basic functional task with supervision. SLP Short Term Goal 4 (Week 4): Pt will  identify 2 cognitive deficits and 2 physical deficits with mod A question cues. SLP Short Term Goal 5 (Week 4): Pt will complete basic functional problem solving tasks with min assist verbal cues.    Weekly Progress Updates:  Pt made limited functional gains this reporting period and has met 1 out of 5 short term goals.  Pt currently requires mod assist multimodal cues during basic, familiar functional tasks due to decreased sustained attention, poor initiation, left inattention, poor awareness of deficits, decreased recall of daily information, and poor functional problem solving.  Pt is consuming dys 3 textures and thin liquids with min assist-supervision cues for use of rate and portion control to manage oral residue and minimize overt s/s of aspiration.  Pt's progress this reporting period has been limited by fatigue resulting from medical issues including UTI and decreased motivation to participate in therapies.  Pt has been noticeably less communicative this reporting period and has been noted with worsening dysarthria when he does initiate functional communication.  This has been clearing over the last several days, likely as an effect of resolving UTI.  Pt would continue to benefit from skilled ST services while inpatient in order to maximize functional independence and reduce burden of care prior to discharge.  Pt and family education is ongoing.  Continue to anticipate that pt will need extensive 24/7 supervision and ST follow up at next level of care.      Intensity:  Minumum of 1-2 x/day, 30 to 90 minutes Frequency: 3 to 5 out of 7 days Duration/Length of Stay: 20-25 days Treatment/Interventions: Cognitive remediation/compensation;Cueing hierarchy;Dysphagia/aspiration precaution training;Internal/external aids;Functional tasks;Environmental controls;Multimodal communication approach;Therapeutic Exercise;Patient/family education;Therapeutic Activities;Speech/Language facilitation   Daily  Session  Skilled Therapeutic Interventions: Pt was seen for skilled ST targeting cognitive goals and family education.  Upon arrival, pt was reclined in bed, awake, alert, with slightly improved response latency in comparison to previous therapy session.  Pt was using the urinal due to the urge to urinate but was unable to void despite attempting for 10 minutes.  Pt required max assist verbal cues to transition from using the urinal to a functional therapeutic task.  SLP facilitated the session with a novel card game targeting working memory, sustained attention, and visually scanning to the left of midline.  Pt required max to total assist multimodal cues for recall of matches between cards, min assist verbal and visual cues to scan to the left of midline, and mod-max assist verbal cues for redirection to task after approximately 1-2 minute intervals.  Pt's girlfriend arrived halfway through today's therapy session and SLP provided skilled education regarding rationale behind pt's currently prescribed diet and recommended swallowing precautions.  Handout was provided to facilitate carryover in the home environment.  Goals updated on this date to reflect current progress and plan of care.       FIM:  Comprehension Comprehension Mode: Auditory Comprehension: 4-Understands basic 75 - 89% of the time/requires cueing 10 - 24% of the time Expression Expression Mode: Verbal Expression: 3-Expresses basic 50 - 74% of the time/requires cueing 25 - 50% of the time. Needs to repeat parts of sentences. Social Interaction Social Interaction: 2-Interacts appropriately 25 - 49% of time - Needs frequent redirection. Problem Solving Problem Solving: 3-Solves basic 50 - 74% of the time/requires cueing 25 - 49% of the time Memory Memory: 3-Recognizes or recalls 50 - 74% of the time/requires cueing 25 - 49% of the time General    Pain Pain Assessment Pain Assessment: No/denies pain  Therapy/Group: Individual  Therapy  [name], Selinda Orion 11/26/2014, 3:49 PM

## 2014-11-27 ENCOUNTER — Inpatient Hospital Stay (HOSPITAL_COMMUNITY): Payer: Self-pay | Admitting: Occupational Therapy

## 2014-11-27 ENCOUNTER — Inpatient Hospital Stay (HOSPITAL_COMMUNITY): Payer: Self-pay | Admitting: Rehabilitation

## 2014-11-27 ENCOUNTER — Inpatient Hospital Stay (HOSPITAL_COMMUNITY): Payer: Self-pay | Admitting: Speech Pathology

## 2014-11-27 DIAGNOSIS — R414 Neurologic neglect syndrome: Secondary | ICD-10-CM

## 2014-11-27 LAB — BASIC METABOLIC PANEL
Anion gap: 9 (ref 5–15)
BUN: 24 mg/dL — AB (ref 6–20)
CALCIUM: 9.3 mg/dL (ref 8.9–10.3)
CO2: 20 mmol/L — ABNORMAL LOW (ref 22–32)
Chloride: 108 mmol/L (ref 101–111)
Creatinine, Ser: 1.33 mg/dL — ABNORMAL HIGH (ref 0.61–1.24)
GFR calc Af Amer: >60 mL/min (ref >60)
Glucose, Bld: 84 mg/dL (ref 70–99)
Potassium: 4.1 mmol/L (ref 3.5–5.1)
SODIUM: 137 mmol/L (ref 135–145)

## 2014-11-27 MED ORDER — METHYLPHENIDATE HCL 5 MG PO TABS
5.0000 mg | ORAL_TABLET | Freq: Once | ORAL | Status: AC
  Administered 2014-11-27: 5 mg via ORAL
  Filled 2014-11-27: qty 1

## 2014-11-27 MED ORDER — METHYLPHENIDATE HCL 5 MG PO TABS
10.0000 mg | ORAL_TABLET | Freq: Two times a day (BID) | ORAL | Status: DC
  Administered 2014-11-27 – 2014-12-03 (×12): 10 mg via ORAL
  Filled 2014-11-27 (×12): qty 2

## 2014-11-27 NOTE — Progress Notes (Signed)
Speech Language Pathology Daily Session Note  Patient Details  Name: Hassel NethCarl Sheppard MRN: [id number] Date of Birth: 29-May-1966  Today's Date: 11/27/2014 SLP Individual Time: 1108-1208 SLP Individual Time Calculation (min): 60 min  Short Term Goals: Week 4: SLP Short Term Goal 1 (Week 4): Pt will tolerate dys 3 textures and thin liquids with supervision cues for use of rate and portion control for three consecutive sessions prior to diet advancement.   SLP Short Term Goal 2 (Week 4): Pt will sustain attention to a functional task for 5-10 minutes with min A verbal and tactile cues. SLP Short Term Goal 3 (Week 4): Pt will attend to the L side of the environment during a basic functional task with supervision. SLP Short Term Goal 4 (Week 4): Pt will identify 2 cognitive deficits and 2 physical deficits with mod A question cues. SLP Short Term Goal 5 (Week 4): Pt will complete basic functional problem solving tasks with min assist verbal cues.    Skilled Therapeutic Interventions:  Pt was seen for skilled ST targeting family education.  Upon arrival, pt was seated upright in wheelchair, awake, lethargic, significant other present for training.  SLP facilitated the session with skilled education regarding pt's current goals and progress in therapy for cognition. SLP emphasized pt's decreased sustained attention to tasks, left inattention, perseveration, poor awareness of deficits, and decreased functional problem solving.  SLP also explained how pt's current cognitive deficits will impact his ability to complete self care and home management tasks independently and successfully.  SLP strongly recommended that pt have extensive 24/7 supervision, assistance for financial and medication management, and HH ST follow up at discharge to continue addressing dysphagia and cognitive deficits.  Pt's significant other participated in training but remained very flat, did not ask many questions, and exhibited a tendency  to fixate on things that were not being covered in today's training (i.e. Asked if pt will be on "chopped foods" forever, she was educated on rationale behind pt's currently prescribed diet in the setting of left sided weakness and cognitive deficits yesterday, SLP RE-educated her on pt's prognosis for diet advancement per improved mentation).   Continue per current plan of care.    FIM:  Comprehension Comprehension Mode: Auditory Comprehension: 4-Understands basic 75 - 89% of the time/requires cueing 10 - 24% of the time Expression Expression Mode: Verbal Expression: 3-Expresses basic 50 - 74% of the time/requires cueing 25 - 50% of the time. Needs to repeat parts of sentences. Social Interaction Social Interaction: 2-Interacts appropriately 25 - 49% of time - Needs frequent redirection. Problem Solving Problem Solving: 3-Solves basic 50 - 74% of the time/requires cueing 25 - 49% of the time Memory Memory: 2-Recognizes or recalls 25 - 49% of the time/requires cueing 51 - 75% of the time  Pain Pain Assessment Pain Assessment: No/denies pain  Therapy/Group: Individual Therapy  Melora Menon, [name] 11/27/2014, 4:02 PM

## 2014-11-27 NOTE — Progress Notes (Signed)
Occupational Therapy Weekly Progress Note  Patient Details  Name: [name] Short MRN: [id number] Date of Birth: Oct 25, 1965  Beginning of progress report period: November 19, 2014 End of progress report period: Nov 27, 2014  Today's Date: 11/27/2014 OT Individual Time: 1000-1100 and 1400-1430 OT Individual Time Calculation (min): 60 min and 30 min   Patient has met 3 of 4 short term goals.  Pt is making slow progress towards goals.  His participation is impaired by his cognitive impairments with decreased initiation, awareness issues, decreased focused and sustained attention, and decreased memory.  Pt currently fluctuates between a mod-max assist with squat pivot transfers depending on attention and initiation.  Pt has demonstrated increased functional use of LUE during bathing and dressing tasks with increased ROM and strength.  Patient continues to demonstrate the following deficits: Lt hemiparesis, Rt gaze preference, LUE impaired sensation and proprioception, impaired initiation, processing speed, and sustained attention, decreased sitting and standing balance and therefore will continue to benefit from skilled OT intervention to enhance overall performance with BADL and Reduce care partner burden.  Patient progressing toward long term goals..  Continue plan of care.  OT Short Term Goals Week 3:  OT Short Term Goal 1 (Week 3): Pt will complete transfer on/off toilet with mod assist of one caregiver OT Short Term Goal 1 - Progress (Week 3): Met OT Short Term Goal 2 (Week 3): Pt will complete LB dressing with max assist OT Short Term Goal 2 - Progress (Week 3): Met OT Short Term Goal 3 (Week 3): Pt will complete bathing at sit > stand level with min assist of 1 caregiver OT Short Term Goal 3 - Progress (Week 3): Met OT Short Term Goal 4 (Week 3): Pt will complete shower transfer with mod assist of one caregiver OT Short Term Goal 4 - Progress (Week 3): Progressing toward goal Week 4:  OT Short  Term Goal 1 (Week 4): STG = LTGs due to remaining LOS  Skilled Therapeutic Interventions/Progress Updates:    1) Engaged in ADL retraining with focus on sit <> stand, standing balance, transfers, and functional use of LUE.  Pt's girlfriend was scheduled for family education but arrived halfway through session reporting oversleeping.  Pt completed bathing and dressing at sit > stand level at sink with increased time for initiation and min assist with sit > stand and to maintain standing balance.  Therapist provided blocking at Lt knee and tactile cues at trunk for upright posture in standing, pt utilized mirror for visual feedback as well.  Girlfriend provided assist with LB dressing requiring mod cues for technique and body mechanics to increase safety.  Educated pt's girlfriend on squat pivot transfers to/from drop arm BSC with focus on keeping pt's body mass low to increase safety.  Pt and girlfriend return demonstrated transfer with mod assist to Lt to [facility] and mod-max assist to right with pt demonstrating decreased attention and initiation.  Pt's girlfriend will require increased education and practice with transfers prior to d/c home.  2) Engaged in therapeutic activity with focus on use of LUE in sitting and standing.  Utilized [employer]" with focus on pt grasping and releasing pieces into grid to make a pattern.  Pt with increased LUE purposeful movements, but would demonstrate perseveration throughout task with inability to release items and often times when released would return back to same piece despite needing to reach elsewhere.  Progressed to finishing pattern in standing with pt requiring blocking at Lt knee and assist  at trunk to promote midline standing.  Pt returned to bed at end of session and left in sidelying with all needs in reach.  Therapy Documentation Precautions:  Precautions Precautions: Fall Precaution Comments: Rt gaze preference, decreased attention to  Lt Restrictions Weight Bearing Restrictions: No Pain:  Pt with no c/o pain  See FIM for current functional status  Therapy/Group: Individual Therapy  Simonne Come 11/27/2014, 12:23 PM

## 2014-11-27 NOTE — Plan of Care (Signed)
Problem: RH Wheelchair Mobility Goal: LTG Patient will propel w/c in community environment (PT) LTG: Patient will propel wheelchair in community environment, # of feet with assist (PT)  Outcome: Not Applicable Date Met:  92/42/68 Pt will have family propel w/c in community due to increased distractions and decreased cognition.

## 2014-11-27 NOTE — Progress Notes (Signed)
Physical Therapy Weekly Progress Note  Patient Details  Name: [name] Short MRN: [id number] Date of Birth: 03-10-1966  Beginning of progress report period: November 19, 2014 End of progress report period: Nov 27, 2014  Today's Date: 11/27/2014 PT Individual Time: 1300-1400 PT Individual Time Calculation (min): 60 min   Patient has met 4 of 4 short term goals.  Pt making very slow but steady gains in therapy this reporting period.  Note that he is progressing with tranfers and standing to a mod A level, however this can vary depending on distractions and environment.  Have initiated family training for two days now with GF and daughter to ensure safe D/C.  Will continue to work towards D/C home next week.  Have downgraded goals to overall mod A level and D/C'd community w/c goal due to pts cognition.   Patient continues to demonstrate the following deficits: decreased balance, decreased cognition, perceptual deficits, L inattention, decreased functional use of LUE/LE and therefore will continue to benefit from skilled PT intervention to enhance overall performance with activity tolerance, balance, postural control, ability to compensate for deficits, functional use of  left upper extremity and left lower extremity, attention, awareness and knowledge of precautions.  Patient not progressing toward long term goals.  See goal revision..  Plan of care revisions: See above in note. .  PT Short Term Goals Week 3:  PT Short Term Goal 1 (Week 3): Pt will perform all aspects of bed mobility with HOB flat and with rail at mod A with 50% cues.  PT Short Term Goal 1 - Progress (Week 3): Met PT Short Term Goal 2 (Week 3): Pt will perform squat pivot transfers R or L with mod A of single therapist with 50% cues.  PT Short Term Goal 2 - Progress (Week 3): Met PT Short Term Goal 3 (Week 3): Pt will perform dynamic standing balance tasks without UE support at mod A and 50% cues.  PT Short Term Goal 3 - Progress  (Week 3): Met PT Short Term Goal 4 (Week 3): Pt will perform gait x 25' w/ LRAD and max A with 75% cues and +2 for chair follow PT Short Term Goal 4 - Progress (Week 3): Met Week 4:  PT Short Term Goal 1 (Week 4): =LTG's due to ELOS  Skilled Therapeutic Interventions/Progress Updates:   Skilled session focused on NMR for LUE/LE as well as trunk and pelvic strengthening and dissociation as well as trunk shortening and lengthening in tall kneeling as well as seated on kay bench on airdex pad.  Requires max A during task for facilitation as well as to get into position with facilitation for upright posture, trunk rotation, tactile cues for initiation to task, and increased weight shift to the R to decrease pusher tendencies.  Ended session with gait with use of R rail to continue to address upright posture, midline orientation, WB through LLE, weight shifting to the R during functional automatic mobility.  Requires max A (+2A for w/c follow) with facilitation for posture, weight shifting and stabilization of LLE during stance, as well as to advance LLE fully.  Pt assisted back to therapy gym and left in w/c with quick release belt donned for next OT session.   Therapy Documentation Precautions:  Precautions Precautions: Fall Precaution Comments: Rt gaze preference, decreased attention to Lt Restrictions Weight Bearing Restrictions: No   Vital Signs: Therapy Vitals Temp: 98.9 F (37.2 C) Temp Source: Oral Pulse Rate: (!) 58 BP: 127/82 mmHg Patient  Position (if appropriate): Lying Oxygen Therapy SpO2: 100 % O2 Device: Not Delivered Pain: Pt with reports of trunk/side pain during reaching tasks, allowed several rest breaks to decrease pain.    See FIM for current functional status  Therapy/Group: Individual Therapy  Denice Bors 11/27/2014, 8:13 AM

## 2014-11-27 NOTE — Progress Notes (Signed)
Subjective/Complaints: Per nsg appears brighter with ritalin Still on IVF, Discussed increasing fluid intake Review of systems unable to obtain secondary to cognitive status Objective: Vital Signs: Blood pressure 127/82, pulse 58, temperature 98.9 F (37.2 C), temperature source Oral, resp. rate 18, weight 91.4 kg (201 lb 8 oz), SpO2 100 %. No results found. Results for orders placed or performed during the hospital encounter of 11/05/14 (from the past 72 hour(s))  Basic metabolic panel     Status: Abnormal   Collection Time: 11/27/14  4:20 AM  Result Value Ref Range   Sodium 137 135 - 145 mmol/L   Potassium 4.1 3.5 - 5.1 mmol/L   Chloride 108 101 - 111 mmol/L   CO2 20 (L) 22 - 32 mmol/L   Glucose, Bld 84 70 - 99 mg/dL   BUN 24 (H) 6 - 20 mg/dL   Creatinine, Ser 1.33 (H) 0.61 - 1.24 mg/dL   Calcium 9.3 8.9 - 10.3 mg/dL   GFR calc non Af Amer >60 >60 mL/min   GFR calc Af Amer >60 >60 mL/min    Comment: (NOTE) The eGFR has been calculated using the CKD EPI equation. This calculation has not been validated in all clinical situations. eGFR's persistently <60 mL/min signify possible Chronic Kidney Disease.    Anion gap 9 5 - 15     HEENT: normal and extensive gold caps Cardio: RRR and no murmurs Resp: CTA B/L and unlabored GI: BS positive and nontender nondistended Extremity:  No Edema, IV site non tender no erythema or drainage Skin:   Intact Neuro: Lethargic, Flat, Abnormal  Delayed responses Sensory reduced sensation in the left lower extremity greater than left upper extremity to pinch, difficult to assess secondary to affect, Abnormal Motor 5/5 in the right deltoid, biceps, triceps, grip, hip flexor, knee extensor, ankle dorsiflexor and plantar flexor and Inattention 0/5 in the left deltoid, 2 minus biceps triceps,2 minus finger flexors, 0/5 and left hip flexor knee extensor and ankle dorsiflexor Gen. no acute distress   Assessment/Plan: 1. Functional deficits  secondary to right ACA infarct with left hemiparesis as well as cognitive deficits which require 3+ hours per day of interdisciplinary therapy in a comprehensive inpatient rehab setting. Physiatrist is providing close team supervision and 24 hour management of active medical problems listed below. Physiatrist and rehab team continue to assess barriers to discharge/monitor patient progress toward functional and medical goals. Methylphenidate retrial 5 mg thus far bump up to 68m FIM: FIM - Bathing Bathing Steps Patient Completed: Chest, Right Arm, Left Arm, Abdomen, Front perineal area, Buttocks Bathing: 3: Mod-Patient completes 5-7 [id number] parts or 50-74%  FIM - Upper Body Dressing/Undressing Upper body dressing/undressing steps patient completed: Thread/unthread left sleeve of pullover shirt/dress, Put head through opening of pull over shirt/dress, Pull shirt over trunk Upper body dressing/undressing: 4: Min-Patient completed 75 plus % of tasks FIM - Lower Body Dressing/Undressing Lower body dressing/undressing steps patient completed: Thread/unthread right pants leg, Thread/unthread left pants leg Lower body dressing/undressing: 2: Max-Patient completed 25-49% of tasks  FIM - Toileting Toileting steps completed by patient: Performs perineal hygiene Toileting: 1: Two helpers  FIM - ToRadio producerevices: Grab bars Toilet Transfers: 2-From toilet/BSC: Max A (lift and lower assist)  FIM - BeEngineer, sitessistive Devices: Arm rests Bed/Chair Transfer: 3: Sit > Supine: Mod A (lifting assist/Pt. 50-74%/lift 2 legs), 3: Bed > Chair or W/C: Mod A (lift or lower assist), 2: Chair or W/C > Bed: Max  A (lift and lower assist)  FIM - Locomotion: Wheelchair Distance: 100 Locomotion: Wheelchair: 0: Activity did not occur FIM - Locomotion: Ambulation Locomotion: Ambulation Assistive Devices: Other (comment) ("three muskateer  style") Ambulation/Gait Assistance: 1: +2 Total assist Locomotion: Ambulation: 1: Two helpers  Comprehension Comprehension Mode: Auditory Comprehension: 4-Understands basic 75 - 89% of the time/requires cueing 10 - 24% of the time  Expression Expression Mode: Verbal Expression: 3-Expresses basic 50 - 74% of the time/requires cueing 25 - 50% of the time. Needs to repeat parts of sentences.  Social Interaction Social Interaction: 2-Interacts appropriately 25 - 49% of time - Needs frequent redirection.  Problem Solving Problem Solving: 3-Solves basic 50 - 74% of the time/requires cueing 25 - 49% of the time  Memory Memory: 3-Recognizes or recalls 50 - 74% of the time/requires cueing 25 - 49% of the time  Medical Problem List and Plan: 1. Functional deficits secondary to right ACA infarct embolic secondary to unknown source 2.  DVT Prophylaxis/Anticoagulation: Subcutaneous Lovenox. Monitor platelet counts of any signs of bleeding. Venous Doppler studies negative 3. Pain Management: Tylenol as needed 4. Dysphagia/left side inattention. Mechanical soft diet. Follow-up speech therapy 5. Neuropsych: This patient is capable of making decisions on his own behalf.titrate ritalin for attn , concentration and initiation 6. Skin/Wound Care: nutrition/skin checks 7. Fluids/Electrolytes/Nutrition: encourage po. Checking labs 8. Hypertension with poor medical compliance. Norvasc 10 mg daily, Coreg 6.25 mg twice a day, lisinopril 40 mg daily.   Reduce lisnopril stop  HCTZ due to elevated BUN/Creat, IVF  Monitor, recheck be met in morning 9. Urine drug screen positive for marijuana. Counseling 10. Hyperlipidemia. Lipitor 11.UTI positive UA,>100K GNR  ecoli sens to Keflex,continue through Saturday 5/7 LOS (Days) 22 A FACE TO FACE EVALUATION WAS PERFORMED  [name] Short 11/27/2014, 8:51 AM

## 2014-11-27 NOTE — Plan of Care (Signed)
Problem: RH Dressing Goal: LTG Patient will perform upper body dressing (OT) LTG Patient will perform upper body dressing with assist, with/without cues (OT).  Upgraded due to increased function in LUE Goal: LTG Patient will perform lower body dressing w/assist (OT) LTG: Patient will perform lower body dressing with assist, with/without cues in positioning using equipment (OT)  Downgraded due to slow progress towards goals  Problem: RH Toilet Transfers Goal: LTG Patient will perform toilet transfers w/assist (OT) LTG: Patient will perform toilet transfers with assist, with/without cues using equipment (OT)  Downgraded due to slow progress towards goals  Problem: RH Tub/Shower Transfers Goal: LTG Patient will perform tub/shower transfers w/assist (OT) LTG: Patient will perform tub/shower transfers with assist, with/without cues using equipment (OT)  Outcome: Not Applicable Date Met:  54/27/06 D/C due to not a focus at this time as pt's significant other reports uncomfortable with tub/shower transfers and plans to complete sponge baths at d/c.

## 2014-11-28 ENCOUNTER — Inpatient Hospital Stay (HOSPITAL_COMMUNITY): Payer: Medicaid Other | Admitting: Physical Therapy

## 2014-11-28 NOTE — Progress Notes (Signed)
Physical Therapy Session Note  Patient Details  Name: [name] Short Sheppard MRN: [id number] Date of Birth: February 11, 1966  Today's Date: 11/28/2014 PT Individual Time: 1430-1530 PT Individual Time Calculation (min): 60 min   Short Term Goals: Week 4:  PT Short Term Goal 1 (Week 4): =LTG's due to ELOS  Skilled Therapeutic Interventions/Progress Updates:    W/C Management: PT instructs pt in w/c propulsion with B UEs req hand over hand assist for apraxic L UE x 200' and mod A. PT instructs pt in using R UE & R LE to self propel manual w/c, but hips immediately slide forward and pt does not demonstrate safety awareness to scoot hips back before PT cues him to do so.   Therapeutic Activity: Pt received in bed, c/o bottom soreness, PT instructs pt in rolling onto R side with bedrail req verbal cues and SBA and pt reports this decreases his pain.  PT instructs pt in rolling L in flat bed without rail req min A, and in L side lie to sit EOB req CGA/tactile cues to get legs off of bed (pt pushes L leg off with R leg), tactile cues for hand placement, and min A to push up to sitting edge of bed.  PT instructs pt in sit to stand from eob with w/c armrest on R side for assist req min A after 2 reps, then in stand/squat-pivot transfer bed to w/c req min A to the R.  PT instructs pt in stand-step transfer w/c to/from mat req min A to R and  PT instructs pt in sit to R side lie transfer req min A for L LE. PT instructs pt in R side lie to sit transfer req min A at trunk, and in stand-pivot transfer mat to w/c to L req min A with strong lean L.   Neuromuscular Reeducation: PT instructs pt in mat level NMR activities: side lie clam shells with L LE AAROM, bridges with manual facilitation for anterior tibial translation over foot, isometric hip adduction with ball between knees x 5 second holds focus on keeping ball in the middle: 3 x 10 reps each PT instructs pt in PNF diagonals while in hook lie to activate abdominal  musculature - PT pressure at knees: gradual ramp on/off, tolerating min-mod resistance (min L leg, mod R leg) x 10 reps each direction. Pt reports he can feel activation in his abdominals with this PNF pattern.   Gait Training: PT instructs pt in ambulation with R wall rail x 30' req mod-max A from PT for L LE progression (ankle ace-wrapped for DF assist and shoe cover on to decrease friction during swing phase) & L knee stability in stance, as well as assist for weight shift R/L, and verbal cues for single step sequencing cues, 2nd assist for w/c follow for safety.   Pt demonstrates significant motor apraxia, moreso in L UE than L LE. Pt did well when taken to private "ortho gym" to reduce distractions, which improved focus on therapeutic activities. No family present for family training. Continue per PT POC.    Therapy Documentation Precautions:  Precautions Precautions: Fall Precaution Comments: Rt gaze preference, decreased attention to Lt Restrictions Weight Bearing Restrictions: No Pain: Pain Assessment Pain Assessment: 0-10 Pain Score: 7  Pain Type: Acute pain Pain Location: Buttocks Pain Descriptors / Indicators: Sore Pain Onset: Gradual Pain Intervention(s): Repositioned (to side lie) 2nd Pain Site Pain Score: 2 Pain Type: Acute pain Pain Location: Knee Pain Orientation: Right Pain Descriptors / Indicators:  Sore Pain Onset: Gradual Pain Intervention(s): Rest  See FIM for current functional status  Therapy/Group: Individual Therapy  Jelan Batterton M 11/28/2014, 8:01 AM

## 2014-11-28 NOTE — Progress Notes (Signed)
Subjective/Complaints: No issues overnight Tolerating therapy Sitting at nurses desk, no signs of agitation Review of systems unable to obtain secondary to cognitive status Objective: Vital Signs: Blood pressure 145/76, pulse 64, temperature 98.6 F (37 C), temperature source Oral, resp. rate 18, weight 91.4 kg (201 lb 8 oz), SpO2 100 %. No results found. Results for orders placed or performed during the hospital encounter of 11/05/14 (from the past 72 hour(s))  Basic metabolic panel     Status: Abnormal   Collection Time: 11/27/14  4:20 AM  Result Value Ref Range   Sodium 137 135 - 145 mmol/L   Potassium 4.1 3.5 - 5.1 mmol/L   Chloride 108 101 - 111 mmol/L   CO2 20 (L) 22 - 32 mmol/L   Glucose, Bld 84 70 - 99 mg/dL   BUN 24 (H) 6 - 20 mg/dL   Creatinine, Ser 1.33 (H) 0.61 - 1.24 mg/dL   Calcium 9.3 8.9 - 10.3 mg/dL   GFR calc non Af Amer >60 >60 mL/min   GFR calc Af Amer >60 >60 mL/min    Comment: (NOTE) The eGFR has been calculated using the CKD EPI equation. This calculation has not been validated in all clinical situations. eGFR's persistently <60 mL/min signify possible Chronic Kidney Disease.    Anion gap 9 5 - 15     HEENT: normal and extensive gold caps Cardio: RRR and no murmurs Resp: CTA B/L and unlabored GI: BS positive and nontender nondistended Extremity:  No Edema, IV site non tender no erythema or drainage Skin:   Intact Neuro: Lethargic, Flat, Abnormal  Delayed responses Sensory reduced sensation in the left lower extremity greater than left upper extremity to pinch, difficult to assess secondary to affect, Abnormal Motor 5/5 in the right deltoid, biceps, triceps, grip, hip flexor, knee extensor, ankle dorsiflexor and plantar flexor and Inattention 0/5 in the left deltoid, 2 minus biceps triceps,2 minus finger flexors, 0/5 and left hip flexor knee extensor and ankle dorsiflexor Gen. no acute distress   Assessment/Plan: 1. Functional deficits secondary  to right ACA infarct with left hemiparesis as well as cognitive deficits which require 3+ hours per day of interdisciplinary therapy in a comprehensive inpatient rehab setting. Physiatrist is providing close team supervision and 24 hour management of active medical problems listed below. Physiatrist and rehab team continue to assess barriers to discharge/monitor patient progress toward functional and medical goals.  FIM: FIM - Bathing Bathing Steps Patient Completed: Chest, Right Arm, Left Arm, Abdomen, Front perineal area, Buttocks, Right upper leg, Left upper leg Bathing: 4: Steadying assist  FIM - Upper Body Dressing/Undressing Upper body dressing/undressing steps patient completed: Thread/unthread left sleeve of pullover shirt/dress, Put head through opening of pull over shirt/dress, Pull shirt over trunk, Thread/unthread right sleeve of pullover shirt/dresss Upper body dressing/undressing: 5: Set-up assist to: Obtain clothing/put away FIM - Lower Body Dressing/Undressing Lower body dressing/undressing steps patient completed: Thread/unthread right pants leg, Thread/unthread left pants leg Lower body dressing/undressing: 2: Max-Patient completed 25-49% of tasks  FIM - Toileting Toileting steps completed by patient: Performs perineal hygiene Toileting: 1: Two helpers  FIM - Radio producer Devices: Bedside commode (drop arm BSC) Toilet Transfers: 3-To toilet/BSC: Mod A (lift or lower assist), 2-From toilet/BSC: Max A (lift and lower assist)  FIM - Control and instrumentation engineer Devices: Arm rests Bed/Chair Transfer: 2: Bed > Chair or W/C: Max A (lift and lower assist), 3: Supine > Sit: Mod A (lifting assist/Pt. 50-74%/lift 2 legs  FIM - Locomotion: Wheelchair Distance: 100 Locomotion: Wheelchair: 0: Activity did not occur FIM - Locomotion: Ambulation Locomotion: Ambulation Assistive Devices: Other (comment) (R handrail) Ambulation/Gait  Assistance: 2: Max assist, 1: +2 Total assist Locomotion: Ambulation: 1: Two helpers  Comprehension Comprehension Mode: Auditory Comprehension: 4-Understands basic 75 - 89% of the time/requires cueing 10 - 24% of the time  Expression Expression Mode: Verbal Expression: 3-Expresses basic 50 - 74% of the time/requires cueing 25 - 50% of the time. Needs to repeat parts of sentences.  Social Interaction Social Interaction: 2-Interacts appropriately 25 - 49% of time - Needs frequent redirection.  Problem Solving Problem Solving: 3-Solves basic 50 - 74% of the time/requires cueing 25 - 49% of the time  Memory Memory: 2-Recognizes or recalls 25 - 49% of the time/requires cueing 51 - 75% of the time  Medical Problem List and Plan: 1. Functional deficits secondary to right ACA infarct embolic secondary to unknown source, retrial of Reglan 2.  DVT Prophylaxis/Anticoagulation: Subcutaneous Lovenox. Monitor platelet counts of any signs of bleeding. Venous Doppler studies negative 3. Pain Management: Tylenol as needed 4. Dysphagia/left side inattention. Mechanical soft diet. Follow-up speech therapy 5. Neuropsych: This patient is capable of making decisions on his own behalf.titrate ritalin for attn , concentration and initiation 6. Skin/Wound Care: nutrition/skin checks 7. Fluids/Electrolytes/Nutrition: encourage po. Checking labs 8. Hypertension with poor medical compliance. Norvasc 10 mg daily, Coreg 6.25 mg twice a day, lisinopril 40 mg daily.   Reduce lisnopril stop  HCTZ due to elevated BUN/Creat, IVF  Monitor, recheck be met in morning 9. Urine drug screen positive for marijuana. Counseling 10. Hyperlipidemia. Lipitor 11.UTI positive UA,>100K GNR  ecoli sens to Keflex,continue through Saturday 5/7 LOS (Days) 23 A FACE TO FACE EVALUATION WAS PERFORMED  Adelaido Nicklaus E 11/28/2014, 10:35 AM

## 2014-11-29 ENCOUNTER — Inpatient Hospital Stay (HOSPITAL_COMMUNITY): Payer: Medicaid Other | Admitting: Occupational Therapy

## 2014-11-29 NOTE — Progress Notes (Signed)
Subjective/Complaints: In bed, denies any pain complaints today. Very delayed responses Review of systems unable to obtain secondary to cognitive status Objective: Vital Signs: Blood pressure 134/74, pulse 55, temperature 98.3 F (36.8 C), temperature source Oral, resp. rate 18, weight 91.4 kg (201 lb 8 oz), SpO2 100 %. No results found. Results for orders placed or performed during the hospital encounter of 11/05/14 (from the past 72 hour(s))  Basic metabolic panel     Status: Abnormal   Collection Time: 11/27/14  4:20 AM  Result Value Ref Range   Sodium 137 135 - 145 mmol/L   Potassium 4.1 3.5 - 5.1 mmol/L   Chloride 108 101 - 111 mmol/L   CO2 20 (L) 22 - 32 mmol/L   Glucose, Bld 84 70 - 99 mg/dL   BUN 24 (H) 6 - 20 mg/dL   Creatinine, Ser 1.33 (H) 0.61 - 1.24 mg/dL   Calcium 9.3 8.9 - 10.3 mg/dL   GFR calc non Af Amer >60 >60 mL/min   GFR calc Af Amer >60 >60 mL/min    Comment: (NOTE) The eGFR has been calculated using the CKD EPI equation. This calculation has not been validated in all clinical situations. eGFR's persistently <60 mL/min signify possible Chronic Kidney Disease.    Anion gap 9 5 - 15     HEENT: normal and extensive gold caps Cardio: RRR and no murmurs Resp: CTA B/L and unlabored GI: BS positive and nontender nondistended Extremity:  No Edema, IV site non tender no erythema or drainage Skin:   Intact Neuro: Lethargic, Flat, Abnormal  Delayed responses Sensory reduced sensation in the left lower extremity greater than left upper extremity to pinch, difficult to assess secondary to affect, Abnormal Motor 5/5 in the right deltoid, biceps, triceps, grip, hip flexor, knee extensor, ankle dorsiflexor and plantar flexor and Inattention 0/5 in the left deltoid, 2 minus biceps triceps,2 minus finger flexors, 0/5 and left hip flexor knee extensor and ankle dorsiflexor Gen. no acute distress   Assessment/Plan: 1. Functional deficits secondary to right ACA infarct  with left hemiparesis as well as cognitive deficits which require 3+ hours per day of interdisciplinary therapy in a comprehensive inpatient rehab setting. Physiatrist is providing close team supervision and 24 hour management of active medical problems listed below. Physiatrist and rehab team continue to assess barriers to discharge/monitor patient progress toward functional and medical goals.  FIM: FIM - Bathing Bathing Steps Patient Completed: Chest, Right Arm, Left Arm, Abdomen, Front perineal area, Buttocks, Right upper leg, Left upper leg Bathing: 4: Steadying assist  FIM - Upper Body Dressing/Undressing Upper body dressing/undressing steps patient completed: Thread/unthread left sleeve of pullover shirt/dress, Put head through opening of pull over shirt/dress, Pull shirt over trunk, Thread/unthread right sleeve of pullover shirt/dresss Upper body dressing/undressing: 5: Set-up assist to: Obtain clothing/put away FIM - Lower Body Dressing/Undressing Lower body dressing/undressing steps patient completed: Thread/unthread right pants leg, Thread/unthread left pants leg Lower body dressing/undressing: 2: Max-Patient completed 25-49% of tasks  FIM - Toileting Toileting steps completed by patient: Performs perineal hygiene Toileting: 1: Two helpers  FIM - Radio producer Devices: Bedside commode (drop arm BSC) Toilet Transfers: 3-To toilet/BSC: Mod A (lift or lower assist), 2-From toilet/BSC: Max A (lift and lower assist)  FIM - Engineer, site Assistive Devices: Arm rests Bed/Chair Transfer: 4: Supine > Sit: Min A (steadying Pt. > 75%/lift 1 leg), 4: Sit > Supine: Min A (steadying pt. > 75%/lift 1 leg), 3: Bed >  Chair or W/C: Mod A (lift or lower assist), 3: Chair or W/C > Bed: Mod A (lift or lower assist)  FIM - Locomotion: Wheelchair Distance: 200 Locomotion: Wheelchair: 3: Travels 150 ft or more: maneuvers on rugs and over door  sills with moderate assistance  (Pt: 50 - 74%) FIM - Locomotion: Ambulation Locomotion: Ambulation Assistive Devices: Other (comment) (R wall rail; PT under L arm and assisting L leg; ace-wrap DF assist with shoe cover) Ambulation/Gait Assistance: 1: +2 Total assist, 2: Max assist Locomotion: Ambulation: 1: Two helpers  Comprehension Comprehension Mode: Auditory Comprehension: 4-Understands basic 75 - 89% of the time/requires cueing 10 - 24% of the time  Expression Expression Mode: Verbal Expression: 3-Expresses basic 50 - 74% of the time/requires cueing 25 - 50% of the time. Needs to repeat parts of sentences.  Social Interaction Social Interaction: 2-Interacts appropriately 25 - 49% of time - Needs frequent redirection.  Problem Solving Problem Solving: 3-Solves basic 50 - 74% of the time/requires cueing 25 - 49% of the time  Memory Memory: 2-Recognizes or recalls 25 - 49% of the time/requires cueing 51 - 75% of the time  Medical Problem List and Plan: 1. Functional deficits secondary to right ACA infarct embolic secondary to unknown source, retrial of Ritalin 2.  DVT Prophylaxis/Anticoagulation: Subcutaneous Lovenox. Monitor platelet counts of any signs of bleeding. Venous Doppler studies negative 3. Pain Management: Tylenol as needed 4. Dysphagia/left side inattention. Mechanical soft diet. Follow-up speech therapy 5. Neuropsych: This patient is capable of making decisions on his own behalf.titrate ritalin for attn , concentration and initiation 6. Skin/Wound Care: nutrition/skin checks 7. Fluids/Electrolytes/Nutrition: encourage po. Checking labs 8. Hypertension with poor medical compliance. Norvasc 10 mg daily, Coreg 6.25 mg twice a day, lisinopril 40 mg daily.   Reduce lisnopril stop  HCTZ due to elevated BUN/Creat, IVF  Monitor, recheck be met in morning 9. Urine drug screen positive for marijuana. Counseling 10. Hyperlipidemia. Lipitor 11.UTI positive UA,>100K GNR  ecoli  sens to Keflex,continue through Saturday 5/7, monitor for recurrence LOS (Days) 24 A FACE TO FACE EVALUATION WAS PERFORMED  KIRSTEINS,ANDREW E 11/29/2014, 10:40 AM

## 2014-11-29 NOTE — Progress Notes (Signed)
Occupational Therapy Session Note  Patient Details  Name: [name] Short MRN: [id number] Date of Birth: 1966/05/30  Today's Date: 11/29/2014 OT Individual Time: 0298-4730 OT Individual Time Calculation (min): 45 min    Short Term Goals: Week 2:  OT Short Term Goal 1 (Week 2): Pt will complete transfer on/off toilet with mod assist of one caregiver OT Short Term Goal 1 - Progress (Week 2): Progressing toward goal OT Short Term Goal 2 (Week 2): Pt will complete bathing at sit > stand level with mod assist of 1 caregiver OT Short Term Goal 2 - Progress (Week 2): Met OT Short Term Goal 3 (Week 2): Pt will complete LB dressing with max assist OT Short Term Goal 3 - Progress (Week 2): Progressing toward goal OT Short Term Goal 4 (Week 2): Pt will complete UB dressing with min assist OT Short Term Goal 4 - Progress (Week 2): Met Week 3:  OT Short Term Goal 1 (Week 3): Pt will complete transfer on/off toilet with mod assist of one caregiver OT Short Term Goal 1 - Progress (Week 3): Met OT Short Term Goal 2 (Week 3): Pt will complete LB dressing with max assist OT Short Term Goal 2 - Progress (Week 3): Met OT Short Term Goal 3 (Week 3): Pt will complete bathing at sit > stand level with min assist of 1 caregiver OT Short Term Goal 3 - Progress (Week 3): Met OT Short Term Goal 4 (Week 3): Pt will complete shower transfer with mod assist of one caregiver OT Short Term Goal 4 - Progress (Week 3): Progressing toward goal Week 4:  OT Short Term Goal 1 (Week 4): STG = LTGs due to remaining LOS  Skilled Therapeutic Interventions/Progress Updates:    Engaged in therapeutic activities For LUE with focus on active extension in all places.  Did  sho push /pull, elbow extension, wrist flexion/extension.  Addressed WB on Left hip during checker set up and clothes pen placement .  Practiced releasing all items with increased time.  Pt needed 1-2 minutes for release to occur even after facilitation.  Pt taken  back to room and left with NT   Therapy Documentation Precautions:  Precautions Precautions: Fall Precaution Comments: Rt gaze preference, decreased attention to Lt Restrictions Weight Bearing Restrictions: No      Pain:  none           See FIM for current functional status  Therapy/Group: Individual Therapy  Lisa Roca 11/29/2014, 6:15 PM

## 2014-11-30 ENCOUNTER — Inpatient Hospital Stay (HOSPITAL_COMMUNITY): Payer: Medicaid Other | Admitting: Speech Pathology

## 2014-11-30 ENCOUNTER — Inpatient Hospital Stay (HOSPITAL_COMMUNITY): Payer: Medicaid Other | Admitting: Occupational Therapy

## 2014-11-30 ENCOUNTER — Inpatient Hospital Stay (HOSPITAL_COMMUNITY): Payer: Self-pay | Admitting: Rehabilitation

## 2014-11-30 NOTE — Progress Notes (Signed)
Subjective/Complaints: In bed, denies any pain complaints today. Very delayed responses Fluid intake 500-600 cc per day, meal intake 60-100% Review of systems unable to obtain secondary to cognitive status Objective: Vital Signs: Blood pressure 136/78, pulse 59, temperature 98.2 F (36.8 C), temperature source Oral, resp. rate 18, weight 91.4 kg (201 lb 8 oz), SpO2 97 %. No results found. No results found for this or any previous visit (from the past 72 hour(s)).   HEENT: normal and extensive gold caps Cardio: RRR and no murmurs Resp: CTA B/L and unlabored GI: BS positive and nontender nondistended Extremity:  No Edema, IV site non tender no erythema or drainage Skin:   Intact Neuro: Lethargic, Flat, Abnormal  Delayed responses Sensory reduced sensation in the left lower extremity greater than left upper extremity to pinch, difficult to assess secondary to affect, Abnormal Motor 5/5 in the right deltoid, biceps, triceps, grip, hip flexor, knee extensor, ankle dorsiflexor and plantar flexor and Inattention 3/5 in the left deltoid, 3 minus biceps triceps,3 minus finger flexors, 2-/5  left hip/knee extensor synergy Gen. no acute distress   Assessment/Plan: 1. Functional deficits secondary to right ACA infarct with left hemiparesis as well as cognitive deficits which require 3+ hours per day of interdisciplinary therapy in a comprehensive inpatient rehab setting. Physiatrist is providing close team supervision and 24 hour management of active medical problems listed below. Physiatrist and rehab team continue to assess barriers to discharge/monitor patient progress toward functional and medical goals.  FIM: FIM - Bathing Bathing Steps Patient Completed: Chest, Right Arm, Left Arm, Abdomen, Front perineal area, Buttocks, Right upper leg, Left upper leg Bathing: 4: Steadying assist  FIM - Upper Body Dressing/Undressing Upper body dressing/undressing steps patient completed:  Thread/unthread left sleeve of pullover shirt/dress, Put head through opening of pull over shirt/dress, Pull shirt over trunk, Thread/unthread right sleeve of pullover shirt/dresss Upper body dressing/undressing: 5: Set-up assist to: Obtain clothing/put away FIM - Lower Body Dressing/Undressing Lower body dressing/undressing steps patient completed: Thread/unthread right pants leg, Thread/unthread left pants leg Lower body dressing/undressing: 2: Max-Patient completed 25-49% of tasks  FIM - Toileting Toileting steps completed by patient: Performs perineal hygiene Toileting: 1: Two helpers  FIM - Diplomatic Services operational officerToilet Transfers Toilet Transfers Assistive Devices: Bedside commode (drop arm BSC) Toilet Transfers: 3-To toilet/BSC: Mod A (lift or lower assist), 2-From toilet/BSC: Max A (lift and lower assist)  FIM - Press photographerBed/Chair Transfer Bed/Chair Transfer Assistive Devices: Arm rests Bed/Chair Transfer: 4: Supine > Sit: Min A (steadying Pt. > 75%/lift 1 leg), 4: Sit > Supine: Min A (steadying pt. > 75%/lift 1 leg), 3: Bed > Chair or W/C: Mod A (lift or lower assist), 3: Chair or W/C > Bed: Mod A (lift or lower assist)  FIM - Locomotion: Wheelchair Distance: 200 Locomotion: Wheelchair: 3: Travels 150 ft or more: maneuvers on rugs and over door sills with moderate assistance  (Pt: 50 - 74%) FIM - Locomotion: Ambulation Locomotion: Ambulation Assistive Devices: Other (comment) (R wall rail; PT under L arm and assisting L leg; ace-wrap DF assist with shoe cover) Ambulation/Gait Assistance: 1: +2 Total assist, 2: Max assist Locomotion: Ambulation: 1: Two helpers  Comprehension Comprehension Mode: Auditory Comprehension: 4-Understands basic 75 - 89% of the time/requires cueing 10 - 24% of the time  Expression Expression Mode: Verbal Expression: 3-Expresses basic 50 - 74% of the time/requires cueing 25 - 50% of the time. Needs to repeat parts of sentences.  Social Interaction Social Interaction: 2-Interacts  appropriately 25 - 49% of time -  Needs frequent redirection.  Problem Solving Problem Solving: 3-Solves basic 50 - 74% of the time/requires cueing 25 - 49% of the time  Memory Memory: 2-Recognizes or recalls 25 - 49% of the time/requires cueing 51 - 75% of the time  Medical Problem List and Plan: 1. Functional deficits secondary to right ACA infarct embolic secondary to unknown source, retrial of Ritalin 2.  DVT Prophylaxis/Anticoagulation: Subcutaneous Lovenox. Monitor platelet counts of any signs of bleeding. Venous Doppler studies negative 3. Pain Management: Tylenol as needed 4. Dysphagia/left side inattention. Mechanical soft diet. Follow-up speech therapy 5. Neuropsych: This patient is capable of making decisions on his own behalf.titrate ritalin for attn , concentration and initiation 6. Skin/Wound Care: nutrition/skin checks 7. Fluids/Electrolytes/Nutrition: encourage po. Checking labs 8. Hypertension with poor medical compliance. Norvasc 10 mg daily, Coreg 6.25 mg twice a day, lisinopril 40 mg daily.  Recheck BMET off HCTZ and lisinopril, stop IVF 9. Urine drug screen positive for marijuana. Counseling 10. Hyperlipidemia. Lipitor 11.UTI treated monitor for recurrence LOS (Days) 25 A FACE TO FACE EVALUATION WAS PERFORMED  KIRSTEINS,ANDREW E 11/30/2014, 9:21 AM

## 2014-11-30 NOTE — Progress Notes (Signed)
Occupational Therapy Session Note  Patient Details  Name: [name] Short MRN: [id number] Date of Birth: 20-Apr-1966  Today's Date: 11/30/2014 OT Individual Time: 1000-1100 and [id number] OT Individual Time Calculation (min): 60 min and 40 min   Short Term Goals: Week 4:  OT Short Term Goal 1 (Week 4): STG = LTGs due to remaining LOS  Skilled Therapeutic Interventions/Progress Updates:    1) Engaged in hands on family education with pt's girlfriend and daughter with focus on transfers, sit <> stand, and functional use of LUE during self-care tasks.  Pt's girlfriend provided majority of physical assistance and cues this session with therapist providing mod verbal cues to girlfriend for technique with transfers and sit <> stand to improve body mechanics and safety.  Pt continues to require mod assist with squat pivot transfers, however demonstrating increased initiation and recall of technique while requiring fewer verbal cues.  Increased initiation throughout self-care tasks this session and use of LUE with washing UB.  Pt requires increased time and cues to terminate tasks or grasp with LUE, benefiting from reaching to place items either in other hand or on table as opposed to pulling item out of hand.  Blocked practice with squat pivot transfers bed <> w/c with pt's girlfriend and daughter with both demonstrating safety with transfers and checked off on safety plan to transfer pt bed <> w/c.  Both will require additional hands on training with other transfers.  2) Engaged in hands on family education with pt and pt's girlfriend with toilet transfers w/c <> drop arm BSC.  Performed squat pivot transfers w/c <> drop arm BSC with pt's girlfriend requiring mod verbal cues for positioning [facility] and cues for body positioning to increase safety.  Pt demonstrating intermittent impulsivity during transfers with attempts to transfer before girlfriend ready and counting to 3 quickly.  Re-educated on safety  with transfers and ensuring caregiver prepared prior to transfers.  Discussed bed positioning with girlfriend reporting pt may have to get out of bed on Rt side, performed bed mobility and transfers to this side of bed with pt requiring mod assist with transfers.  Pt's girlfriend left midway through session to go to work.  After she left, pt apologized to this therapist for inappropriate behavior last week and reports concerns that caregiver can and will provide the assist he needs.  Therapy Documentation Precautions:  Precautions Precautions: Fall Precaution Comments: Rt gaze preference, decreased attention to Lt Restrictions Weight Bearing Restrictions: No Pain: Pain Assessment Pain Assessment: No/denies pain Pain Score: 0-No pain  See FIM for current functional status  Therapy/Group: Individual Therapy  Rosalio LoudHOXIE, Ottis Vacha 11/30/2014, 12:25 PM

## 2014-11-30 NOTE — Progress Notes (Signed)
Speech Language Pathology Daily Session Note  Patient Details  Name: [name] Short Sheppard MRN: [id number] Date of Birth: [birth year]  Today's Date: 11/30/2014 SLP Individual Time: 1100-1200 SLP Individual Time Calculation (min): 60 min  Short Term Goals: Week 4: SLP Short Term Goal 1 (Week 4): Pt will tolerate dys 3 textures and thin liquids with supervision cues for use of rate and portion control for three consecutive sessions prior to diet advancement.   SLP Short Term Goal 2 (Week 4): Pt will sustain attention to a functional task for 5-10 minutes with min A verbal and tactile cues. SLP Short Term Goal 3 (Week 4): Pt will attend to the L side of the environment during a basic functional task with supervision. SLP Short Term Goal 4 (Week 4): Pt will identify 2 cognitive deficits and 2 physical deficits with mod A question cues. SLP Short Term Goal 5 (Week 4): Pt will complete basic functional problem solving tasks with min assist verbal cues.    Skilled Therapeutic Interventions: Skilled treatment session focused on cognitive goals and continued family education with the patient's daughter and significant other.  SLP facilitated session by providing Mod A verbal and question cues for functional problem solving and attention to basic money management task, however, patient attended to left field of environment during task with Min A verbal cues. SLP also provided patient with written list of current medications. Patient was able to recall 65% of medications and their functions but required Max A multimodal cues for problem solving and attention to left field of environment while organizing a 4 time per day pill box. Patient's family present throughout session but appeared disengaged at times and were creating distractions (granddaughter listening to electronic device at a high volume and daughter talking on phone) despite patient requests to discontinue. Patient left in wheelchair with family present.  Continue with current plan of care.    FIM:  Comprehension Comprehension Mode: Auditory Comprehension: 4-Understands basic 75 - 89% of the time/requires cueing 10 - 24% of the time Expression Expression Mode: Verbal Expression: 3-Expresses basic 50 - 74% of the time/requires cueing 25 - 50% of the time. Needs to repeat parts of sentences. Social Interaction Social Interaction: 3-Interacts appropriately 50 - 74% of the time - May be physically or verbally inappropriate. Problem Solving Problem Solving: 3-Solves basic 50 - 74% of the time/requires cueing 25 - 49% of the time Memory Memory: 2-Recognizes or recalls 25 - 49% of the time/requires cueing 51 - 75% of the time  Pain Pain Assessment Pain Assessment: No/denies pain  Therapy/Group: Individual Therapy  Myrakle Wingler 11/30/2014, 4:41 PM

## 2014-11-30 NOTE — Progress Notes (Signed)
Physical Therapy Session Note  Patient Details  Name: [name] Short MRN: [id number] Date of Birth: 26-May-1966  Today's Date: 11/30/2014 PT Individual Time: 1300-1400 PT Individual Time Calculation (min): 60 min   Short Term Goals: Week 4:  PT Short Term Goal 1 (Week 4): =LTG's due to ELOS  Skilled Therapeutic Interventions/Progress Updates:   Skilled session continues to focus on family education and training with GF (daughter supposed to participate, however she had to leave session early).  Continue to work on bed mobility, w/c set up, removal of parts, sequencing and performing transfers to bed, furniture and therapy mat to better simulate home environment.  Performed all at mod A level with min A bed mobility with cues for GF on how to better assist him with feet staggered to allow him increased forward weight shift.  Pts GF returned demonstration with improved technique.  Also continue to discuss home set up, bathroom and door widths.  Offered a home evaluation, however pts GF states that they will "be okay."  Continue to provide cues for proper body mechanics and safety.  Also note that pt attempting to tell PT regarding concerns with going home, however due to GF being present, he would not voice concerns.  See OT note for details.  Pt assisted back to room and left in w/c with all needs in reach.     Therapy Documentation Precautions:  Precautions Precautions: Fall Precaution Comments: Rt gaze preference, decreased attention to Lt Restrictions Weight Bearing Restrictions: No   Vital Signs: Therapy Vitals Temp: 98.7 F (37.1 C) Temp Source: Oral Pulse Rate: 65 Resp: 18 BP: (!) 160/93 mmHg Patient Position (if appropriate): Lying Oxygen Therapy SpO2: 99 % O2 Device: Not Delivered Pain: Pt with no c/o pain during session.   See FIM for current functional status  Therapy/Group: Individual Therapy  [name] Short, [name] Short 11/30/2014, 4:10 PM

## 2014-12-01 ENCOUNTER — Inpatient Hospital Stay (HOSPITAL_COMMUNITY): Payer: Self-pay | Admitting: Speech Pathology

## 2014-12-01 ENCOUNTER — Inpatient Hospital Stay (HOSPITAL_COMMUNITY): Payer: Medicaid Other | Admitting: Occupational Therapy

## 2014-12-01 ENCOUNTER — Inpatient Hospital Stay (HOSPITAL_COMMUNITY): Payer: Self-pay | Admitting: Rehabilitation

## 2014-12-01 LAB — BASIC METABOLIC PANEL
Anion gap: 10 (ref 5–15)
BUN: 12 mg/dL (ref 6–20)
CALCIUM: 9.4 mg/dL (ref 8.9–10.3)
CO2: 22 mmol/L (ref 22–32)
CREATININE: 1.15 mg/dL (ref 0.61–1.24)
Chloride: 106 mmol/L (ref 101–111)
GFR calc non Af Amer: >60 mL/min (ref >60)
Glucose, Bld: 85 mg/dL (ref 70–99)
Potassium: 3.6 mmol/L (ref 3.5–5.1)
Sodium: 138 mmol/L (ref 135–145)

## 2014-12-01 NOTE — Progress Notes (Signed)
Cardiology consulted in regards to plans for 30 day monitor that they had recommended early on hospital course. Await their follow-up and plan of care

## 2014-12-01 NOTE — Progress Notes (Signed)
Speech Language Pathology Daily Session Note  Patient Details  Name: [name] Short MRN: [id number] Date of Birth: 06-24-1966  Today's Date: 12/01/2014 SLP Individual Time: 1300-1405 SLP Individual Time Calculation (min): 65 min  Short Term Goals: Week 4: SLP Short Term Goal 1 (Week 4): Pt will tolerate dys 3 textures and thin liquids with supervision cues for use of rate and portion control for three consecutive sessions prior to diet advancement.   SLP Short Term Goal 2 (Week 4): Pt will sustain attention to a functional task for 5-10 minutes with min A verbal and tactile cues. SLP Short Term Goal 3 (Week 4): Pt will attend to the L side of the environment during a basic functional task with supervision. SLP Short Term Goal 4 (Week 4): Pt will identify 2 cognitive deficits and 2 physical deficits with mod A question cues. SLP Short Term Goal 5 (Week 4): Pt will complete basic functional problem solving tasks with min assist verbal cues.    Skilled Therapeutic Interventions:  Pt was seen for skilled ST targeting goals for cognition and ongoing family education.  Upon arrival, pt's daughter and granddaughter were present.  Daughter and granddaughter were on electronic devices which were playing very loudly and obviously distracting to the patient who was sitting up in the wheelchair, awake, alert, and consuming lunch.  Pt was noted with diffuse oral residue with repetitive and nonproductive chewing motion.  Pt required min assist verbal cues to problem solve use of thin liquid wash to clear residue from oral cavity.  Pt transported to a more quiet environment to facilitate sustained attention when completing structured therapeutic tasks.  Pt required mod-max assist verbal cues for redirection as he perseverated on concerns he has regarding his girlfriend's ability to care for him at discharge.  CSW already made aware.  Upon return to room, SLP provided skilled education to pt's daughter regarding pt's  cognitive deficits and emphasized the need to limit distractions as much as possible during functional tasks.  SLP also provided skilled education regarding compensatory strategies for left attention.  Pt's daughter verbalized understanding of recommendations.  Continue per current plan of care.    FIM:  Comprehension Comprehension Mode: Auditory Comprehension: 4-Understands basic 75 - 89% of the time/requires cueing 10 - 24% of the time Expression Expression Mode: Verbal Expression: 3-Expresses basic 50 - 74% of the time/requires cueing 25 - 50% of the time. Needs to repeat parts of sentences. Social Interaction Social Interaction: 3-Interacts appropriately 50 - 74% of the time - May be physically or verbally inappropriate. Problem Solving Problem Solving: 3-Solves basic 50 - 74% of the time/requires cueing 25 - 49% of the time Memory Memory: 2-Recognizes or recalls 25 - 49% of the time/requires cueing 51 - 75% of the time FIM - Eating Eating Activity: 5: Supervision/cues  Pain Pain Assessment Pain Assessment: No/denies pain  Therapy/Group: Individual Therapy  [name] Short, [name] 12/01/2014, 4:00 PM

## 2014-12-01 NOTE — Progress Notes (Signed)
Subjective/Complaints: No c/o Review of systems unable to obtain secondary to cognitive status Objective: Vital Signs: Blood pressure 169/91, pulse 56, temperature 98.7 F (37.1 C), temperature source Oral, resp. rate 17, weight 91.4 kg (201 lb 8 oz), SpO2 98 %. No results found. Results for orders placed or performed during the hospital encounter of 11/05/14 (from the past 72 hour(s))  Basic metabolic panel     Status: None   Collection Time: 12/01/14  6:00 AM  Result Value Ref Range   Sodium 138 135 - 145 mmol/L   Potassium 3.6 3.5 - 5.1 mmol/L   Chloride 106 101 - 111 mmol/L   CO2 22 22 - 32 mmol/L   Glucose, Bld 85 70 - 99 mg/dL   BUN 12 6 - 20 mg/dL   Creatinine, Ser 1.15 0.61 - 1.24 mg/dL   Calcium 9.4 8.9 - 10.3 mg/dL   GFR calc non Af Amer >60 >60 mL/min   GFR calc Af Amer >60 >60 mL/min    Comment: (NOTE) The eGFR has been calculated using the CKD EPI equation. This calculation has not been validated in all clinical situations. eGFR's persistently <60 mL/min signify possible Chronic Kidney Disease.    Anion gap 10 5 - 15     HEENT: normal and extensive gold caps Cardio: RRR and no murmurs Resp: CTA B/L and unlabored GI: BS positive and nontender nondistended Extremity:  No Edema, IV site non tender no erythema or drainage Skin:   Intact Neuro: Lethargic, Flat, Abnormal  Delayed responses Sensory reduced sensation in the left lower extremity greater than left upper extremity to pinch, difficult to assess secondary to affect, Abnormal Motor 5/5 in the right deltoid, biceps, triceps, grip, hip flexor, knee extensor, ankle dorsiflexor and plantar flexor and Inattention 3+/5 in the left deltoid, 3+ minus biceps triceps,3 minus finger flexors, 2-/5  left hip/knee extensor synergy Gen. no acute distress   Assessment/Plan: 1. Functional deficits secondary to right ACA infarct with left hemiparesis as well as cognitive deficits which require 3+ hours per day of  interdisciplinary therapy in a comprehensive inpatient rehab setting. Physiatrist is providing close team supervision and 24 hour management of active medical problems listed below. Physiatrist and rehab team continue to assess barriers to discharge/monitor patient progress toward functional and medical goals.  FIM: FIM - Bathing Bathing Steps Patient Completed: Chest, Right Arm, Left Arm, Abdomen, Front perineal area, Buttocks, Right upper leg, Left upper leg Bathing: 4: Steadying assist  FIM - Upper Body Dressing/Undressing Upper body dressing/undressing steps patient completed: Thread/unthread left sleeve of pullover shirt/dress, Put head through opening of pull over shirt/dress, Pull shirt over trunk, Thread/unthread right sleeve of pullover shirt/dresss Upper body dressing/undressing: 4: Min-Patient completed 75 plus % of tasks FIM - Lower Body Dressing/Undressing Lower body dressing/undressing steps patient completed: Thread/unthread right pants leg, Thread/unthread left pants leg Lower body dressing/undressing: 2: Max-Patient completed 25-49% of tasks  FIM - Toileting Toileting steps completed by patient: Performs perineal hygiene Toileting: 1: Two helpers  FIM - Radio producer Devices: Bedside commode (drop arm BSC) Toilet Transfers: 3-To toilet/BSC: Mod A (lift or lower assist), 2-From toilet/BSC: Max A (lift and lower assist)  FIM - Engineer, site Assistive Devices: Arm rests Bed/Chair Transfer: 4: Supine > Sit: Min A (steadying Pt. > 75%/lift 1 leg), 4: Sit > Supine: Min A (steadying pt. > 75%/lift 1 leg), 3: Bed > Chair or W/C: Mod A (lift or lower assist), 3: Chair or W/C >  Bed: Mod A (lift or lower assist)  FIM - Locomotion: Wheelchair Distance: 200 Locomotion: Wheelchair: 0: Activity did not occur FIM - Locomotion: Ambulation Locomotion: Ambulation Assistive Devices: Other (comment) (R wall rail; PT under L arm and  assisting L leg; ace-wrap DF assist with shoe cover) Ambulation/Gait Assistance: 1: +2 Total assist, 2: Max assist Locomotion: Ambulation: 0: Activity did not occur  Comprehension Comprehension Mode: Auditory Comprehension: 4-Understands basic 75 - 89% of the time/requires cueing 10 - 24% of the time  Expression Expression Mode: Verbal Expression: 3-Expresses basic 50 - 74% of the time/requires cueing 25 - 50% of the time. Needs to repeat parts of sentences.  Social Interaction Social Interaction: 3-Interacts appropriately 50 - 74% of the time - May be physically or verbally inappropriate.  Problem Solving Problem Solving: 3-Solves basic 50 - 74% of the time/requires cueing 25 - 49% of the time  Memory Memory: 2-Recognizes or recalls 25 - 49% of the time/requires cueing 51 - 75% of the time  Medical Problem List and Plan: 1. Functional deficits secondary to right ACA infarct embolic secondary to unknown source, retrial of Ritalin 2.  DVT Prophylaxis/Anticoagulation: Subcutaneous Lovenox. Monitor platelet counts of any signs of bleeding. Venous Doppler studies negative 3. Pain Management: Tylenol as needed 4. Dysphagia/left side inattention. Mechanical soft diet. Follow-up speech therapy 5. Neuropsych: This patient is capable of making decisions on his own behalf.titrate ritalin for attn , concentration and initiation 6. Skin/Wound Care: nutrition/skin checks 7. Fluids/Electrolytes/Nutrition: encourage po. Checking labs 8. Hypertension with poor medical compliance. Norvasc 10 mg daily, Coreg 6.25 mg twice a day, lisinopril 40 mg daily.  Recheck BMET off HCTZ and lisinopril, stop IVF 9. Urine drug screen positive for marijuana. Counseling 10. Hyperlipidemia. Lipitor 11.UTI treated monitor for recurrence LOS (Days) 26 A FACE TO FACE EVALUATION WAS PERFORMED  KIRSTEINS,ANDREW E 12/01/2014, 8:37 AM

## 2014-12-01 NOTE — Progress Notes (Signed)
Social Work Patient ID: [name] Short, male   DOB: [birth year], 49 y.o.   MRN: [id number] Met with pt and [name] to discuss equipment and follow up.  Have made referral to [facility] for drop-arm bedside commode and wheelchair.  Team recommends home health to start out with then transition to OP Therapies.  Referral made to [facility] for follow up therapies.  Will make appointment at [facility] prior to discharge from hospital for establishing a PCP and assist with medicines. Had contacted Dr Lysle Rubens which girlfriend wanted but he does not take Medicaid. Girlfriend and pt's daughter have been here all week to learn pt's care in preparation for discharge Thurs.

## 2014-12-01 NOTE — Progress Notes (Signed)
Physical Therapy Session Note  Patient Details  Name: [name] Short MRN: [id number] Date of Birth: 06/05/1966  Today's Date: 12/01/2014 PT Individual Time: 0930-1030 PT Individual Time Calculation (min): 60 min   Short Term Goals: Week 4:  PT Short Term Goal 1 (Week 4): =LTG's due to ELOS  Skilled Therapeutic Interventions/Progress Updates:   Skilled session focused on education on safe DC home and any concerns that pt may have, gait training in hallway with and without use of RW to address posture, midline orientation, NMR through LLE, and sustained attention.  Pt requires max A (+2A for chair follow) for all gait activities.  Note that during gait, PT donned L allard AFO, L heel wedge and shoe cover to increase success for DF assist, clearing LLE, and to prevent L knee hyperextension.  Performed 15' x 2 reps with continued cues for posture, increased weight shift to the L to decrease avoidance of LLE.  GF and daughter present during second half of session, therefore assisted pt down to GF's car and performed car transfer x 2 reps with both daughter and GF to ensure safe DC.  Cues for pt regarding safety and technique, as well as for GF and daughter regarding placement of w/c, set up, sequencing of transfer, and body mechanics/safe handling techniques.  Encouraged use of bobath technique to keep pt shifted forward for ease of transfer.  Both returned demonstration very well during session.  Assisted pt back to unit and left in w/c with all needs in reach and quick release belt donned.   Therapy Documentation Precautions:  Precautions Precautions: Fall Precaution Comments: Rt gaze preference, decreased attention to Lt Restrictions Weight Bearing Restrictions: No  Pain: Pt with no c/o pain during session.    See FIM for current functional status  Therapy/Group: Individual Therapy  Vista Deckarcell, Savier Trickett Ann 12/01/2014, 10:40 AM

## 2014-12-01 NOTE — Progress Notes (Signed)
Occupational Therapy Session Note  Patient Details  Name: [name] Short MRN: [id number] Date of Birth: Feb 09, 1966  Today's Date: 12/01/2014 OT Individual Time: 1030-1155 OT Individual Time Calculation (min): 85 min    Short Term Goals: Week 4:  OT Short Term Goal 1 (Week 4): STG = LTGs due to remaining LOS  Skilled Therapeutic Interventions/Progress Updates:    Engaged in hands on family education with pt's girlfriend and daughter with focus on self-care tasks and transfers.  Pt's girlfriend assisting pt with UB bathing upon arrival providing appropriate cues and encouragement to increase active use of LUE during task.  Plan is for girlfriend to provide assist with bathing and dressing tasks as daughter does not feel comfortable in this area.  Pt required mod assist with sit <> stand for LB bathing and dressing with pt's girlfriend about to provide necessary tactile and verbal cues to promote weight shift for sit > stand and upright standing balance.  Pt donned Depends style pullup this session with assist at LLE to cross leg over Rt knee and maintain position.    In ADL apt focus on w/c > bed > drop arm BSC transfers with both pt's girlfriend and daughter with each completing full circuit of transfers x2 in preparation for d/c home. Therapist provided cues for pt regarding safety and technique, as well as for girlfriend and daughter regarding placement of w/c and BSC, sequencing of transfer, and body mechanics/safe handling techniques. Encouraged use of over the back technique to keep pt shifted forward for ease of transfer and not to rely on grabbing pt by the pants. Both returned demonstration very well during session and report no further questions at this time.  Pt's girlfriend reports that while w/c will fit in the bathroom it may not be able to be positioned next to toilet, therefore recommend use of BSC as primary toilet (and urinal) at this time.  She reports that she may be able to bring  pictures to further discuss setup tomorrow.  Therapy Documentation Precautions:  Precautions Precautions: Fall Precaution Comments: Rt gaze preference, decreased attention to Lt Restrictions Weight Bearing Restrictions: No Pain:  Pt with no c/o pain  See FIM for current functional status  Therapy/Group: Individual Therapy  Rosalio LoudHOXIE, Majestic Molony 12/01/2014, 12:31 PM

## 2014-12-02 ENCOUNTER — Inpatient Hospital Stay (HOSPITAL_COMMUNITY): Payer: Medicaid Other | Admitting: Occupational Therapy

## 2014-12-02 ENCOUNTER — Inpatient Hospital Stay (HOSPITAL_COMMUNITY): Payer: Self-pay | Admitting: Rehabilitation

## 2014-12-02 ENCOUNTER — Inpatient Hospital Stay (HOSPITAL_COMMUNITY): Payer: Self-pay | Admitting: Occupational Therapy

## 2014-12-02 ENCOUNTER — Inpatient Hospital Stay (HOSPITAL_COMMUNITY): Payer: Self-pay | Admitting: Speech Pathology

## 2014-12-02 NOTE — Progress Notes (Signed)
Social Work Patient ID: [name] Short, male   DOB: [birth year], 49 y.o.   MRN: [id number] Met with pt and girlfriend who was here for education.  Updated team conference progress and discharge tomorrow. Both feel good about discharge tomorrow.  All family education has been done with pt's daughter and girlfriend. Have ordered equipment and  Follow up arranged. Will connect with [facility] for PCP and [facility] for assist with medicines.

## 2014-12-02 NOTE — Progress Notes (Signed)
Occupational Therapy Session Note  Patient Details  Name: [name] Short MRN: [id number] Date of Birth: September 07, 1965  Today's Date: 12/02/2014 OT Individual Time: 1430-1500 OT Individual Time Calculation (min): 30 min    Short Term Goals: Week 4:  OT Short Term Goal 1 (Week 4): STG = LTGs due to remaining LOS    Skilled Therapeutic Interventions/Progress Updates:  Upon entering the room, pt seated in wheelchair awaiting therapist with no c/o pain. Pt requesting to use urinal before leaving room. Pt dropping urinal multiple times while performing clothing management. Therapist propelled pt via wheelchair to ADL apartment for energy conservation. Therapist placing small manipulative pegs sized for 35 hole peg board in front of patient for tasks. Pt able to pick up and place 9 pegs into board with L hand in 1 min and 43 seconds. Pt having 5/9 drops from L when placing pegs into board. Pt then instructed to remove pegs and drop into container. He was successful in removing peg from board with L hand but was unable to release. Pt observed to be rubbing peg in between fingers and utilizing R hand to remove peg from L. Pt propelling self ~150 feet with hemiplegic technique and supervision to room. Mod A stand pivot transfer from wheelchair >bed. Sit >supine with min A. Bed alarm activated and call bell within reach upon exiting the room.   Therapy Documentation Precautions:  Precautions Precautions: Fall Precaution Comments: Rt gaze preference, decreased attention to Lt Restrictions Weight Bearing Restrictions: No Pain: Pain Assessment Pain Assessment: No/denies pain  See FIM for current functional status  Therapy/Group: Individual Therapy  Lowella Gripittman, Anshu Wehner L 12/02/2014, 4:01 PM

## 2014-12-02 NOTE — Plan of Care (Signed)
Problem: RH Stairs Goal: LTG Patient will ambulate up and down stairs w/assist (PT) LTG: Patient will ambulate up and down # of stairs with assistance (PT)  Outcome: Not Applicable Date Met:  59/74/16 Pt with level entry to home, no need to assess stairs at this time.

## 2014-12-02 NOTE — Progress Notes (Signed)
Speech Language Pathology Discharge Summary  Patient Details  Name: [name] Short MRN: [id number] Date of Birth: [birth year]  Today's Date: 12/02/2014 SLP Individual Time: 1300-1400 SLP Individual Time Calculation (min): 60 min   Skilled Therapeutic Interventions:  Pt was seen for skilled ST targeting completion of family education and grad day activities.  Upon arrival, pt's significant other and daughter were present.  SLP offered time to answer any remaining questions they may have prior to discharge.  Pt's girlfriend verbalized understanding of recommended swallowing precautions and dys 3 textures with supervision.  SLP explained that pt's diet would be upgraded per home health ST at next level of care.  All family's questions were answered to their satisfaction at this time.  SLP facilitated the session with basic cognitive task targeting sustained attention, initiation, and functional problem solving.  Pt initiated task with intermittent min assist verbal cues.  He also sustained attention to task for 3-5 minute intervals before requiring min-mod verbal cues for redirection.  Pt required mod assist verbal, visual, and tactile cues to facilitate organization and problem solving when sorting similar items into 6 different categories.  Pt attended to and utilized left upper extremity with min verbal cues.  Pt is on track for discharge tomorrow.      Patient has met 5 of 7 long term goals.  Patient to discharge at overall Mod;Min level.  Reasons goals not met: Pt requires min assist to follow 1 and 2 step commands and for intellectual awareness of defcits    Clinical Impression/Discharge Summary:  Pt made functional gains while inpatient and is discharging having met 5 out of 7 long term goals.  Pt continues to present with moderately-severe cognitive deficits and requires overall mod assist for basic tasks due to decreased intellectual awareness of deficits, decreased sustained attention to tasks,  poor initiation, poor recall of daily information, and decreased functional problem solving.   Pt is currently consuming dys 3 textures with thin liquids with supervision cues for rate and portion control to monitor and correct left sided buccal residue post swallow. Pt may be upgraded to regular textures per improved mentation as he dysphagia appears to be largely cognitively based at this point.  Pt also presents with a mild-moderate dysarthria characterized by imprecise articulation of consonants and decreased vocal intensity that worsened recently s/p UTI and trial d/c of Ritalin.  This appears to be slowly resolving; however, pt continues to require mod cues to increase vocal intensity for intelligibility.  Pt is discharging home with 24/7 supervision from family and assistance for medication and financial management.  Pt would also benefit from follow up home health ST services to maximize cognitive function and reduce burden of care.     Care Partner:  Caregiver Able to Provide Assistance: Yes  Type of Caregiver Assistance: Physical;Cognitive  Recommendation:  Home Health SLP;24 hour supervision/assistance  Rationale for SLP Follow Up: Maximize swallowing safety;Maximize cognitive function and independence;Reduce caregiver burden   Equipment: none recommended by SLP    Reasons for discharge: Discharged from hospital   Patient/Family Agrees with Progress Made and Goals Achieved: Yes   See FIM for current functional status  PageSelinda Orion 12/02/2014, 12:50 PM

## 2014-12-02 NOTE — Progress Notes (Signed)
Occupational Therapy Discharge Summary  Patient Details  Name: [name] Short MRN: [id number] Date of Birth: [birth year]  Patient has met 12 of 12 long term goals due to improved activity tolerance, improved balance, postural control, ability to compensate for deficits, functional use of  LEFT upper extremity, improved attention and improved awareness.  Patient to discharge at overall Mod Assist level with transfers and sit <> stand, min assist at seated level with self-care tasks.  Patient's care partner is independent to provide the necessary physical and cognitive assistance at discharge.  Patient's girlfriend and daughter have attended multiple days of family education and have verbalized and demonstrated competence with assisting pt at current level.  Reasons goals not met: NA  Recommendation:  Patient will benefit from ongoing skilled OT services in home health setting to continue to advance functional skills in the area of BADL and Reduce care partner burden.  Equipment: drop arm BSC  Reasons for discharge: treatment goals met and discharge from hospital  Patient/family agrees with progress made and goals achieved: Yes  OT Discharge Precautions/Restrictions  Precautions Precautions: Fall Precaution Comments: Rt gaze preference, decreased attention to Lt Restrictions Weight Bearing Restrictions: No Pain Pain Assessment Pain Assessment: No/denies pain Pain Score: 0-No pain ADL  See FIM Vision/Perception  Vision- History Baseline Vision/History: No visual deficits Patient Visual Report: No change from baseline Vision- Assessment Vision Assessment?: Vision impaired- to be further tested in functional context Additional Comments: Lt inattention with mobility, however improved from eval with pt able to scan to Lt with min cues.  Cognition Overall Cognitive Status: Impaired/Different from baseline Arousal/Alertness: Awake/alert Orientation Level: Oriented X4 Attention:  Sustained Focused Attention: Appears intact Sustained Attention: Impaired Sustained Attention Impairment: Verbal basic;Functional basic Memory: Impaired Memory Impairment: Decreased recall of new information Awareness: Impaired Awareness Impairment: Intellectual impairment Problem Solving: Impaired Problem Solving Impairment: Verbal basic;Functional basic Sequencing: Impaired Sequencing Impairment: Verbal basic;Functional basic Safety/Judgment: Impaired Sensation Sensation Light Touch: Impaired by gross assessment Stereognosis: Not tested Hot/Cold: Not tested Proprioception: Impaired by gross assessment Additional Comments: Pt able to feel light touch in LUE/LE, however is very diminished compared to R Coordination Gross Motor Movements are Fluid and Coordinated: No Fine Motor Movements are Fluid and Coordinated: No Finger Nose Finger Test: requires increased time and effort with moderate difficulty due to decreased initiation and sustained attention Heel Shin Test: unable to asses due to LLE weakness Motor  Motor Motor: Hemiplegia;Abnormal postural alignment and control Motor - Discharge Observations: L hemiplegia, decreased balance, decreased trunk control Mobility  Bed Mobility Bed Mobility: Supine to Sit;Sit to Supine Supine to Sit: 4: Min assist Supine to Sit Details: Verbal cues for sequencing;Verbal cues for technique;Verbal cues for precautions/safety;Manual facilitation for weight shifting Sit to Supine: 4: Min assist Sit to Supine - Details: Verbal cues for sequencing;Verbal cues for technique;Verbal cues for precautions/safety;Manual facilitation for weight shifting Transfers Sit to Stand: 3: Mod assist Sit to Stand Details: Verbal cues for sequencing;Verbal cues for technique;Verbal cues for precautions/safety;Manual facilitation for weight shifting;Manual facilitation for weight bearing Stand to Sit: 3: Mod assist Stand to Sit Details (indicate cue type and  reason): Verbal cues for sequencing;Verbal cues for technique;Verbal cues for precautions/safety;Manual facilitation for weight shifting;Manual facilitation for weight bearing  Trunk/Postural Assessment  Cervical Assessment Cervical Assessment: Exceptions to [facility] Cervical Strength Overall Cervical Strength Comments: Pt demonstrated R lateral flexion/rotation and R gaze due to L inattention Thoracic Assessment Thoracic Assessment: Within Functional Limits Lumbar Assessment Lumbar Assessment: Exceptions to [facility] Lumbar Strength Overall Lumbar  Strength Comments: pt with increased posterior pelvic tilt in sitting and standing.  Postural Control Postural Control: Deficits on evaluation Trunk Control: Impaired in seated; multidirectional LOB Righting Reactions: Pt with decreased righting reactions when leaning laterally.  Postural Limitations: Bearing majority of weight on L side of pelvis; R side shortened, L elongated   Balance Balance Balance Assessed: Yes Static Sitting Balance Static Sitting - Balance Support: Bilateral upper extremity supported;Feet supported Static Sitting - Level of Assistance: 5: Stand by assistance Dynamic Sitting Balance Dynamic Sitting - Balance Support: Feet supported Dynamic Sitting - Level of Assistance: 5: Stand by assistance;4: Min assist Dynamic Sitting - Balance Activities: Forward lean/weight shifting Static Standing Balance Static Standing - Balance Support: During functional activity Static Standing - Level of Assistance: 4: Min assist Dynamic Standing Balance Dynamic Standing - Balance Support: During functional activity Dynamic Standing - Level of Assistance: 3: Mod assist;4: Min assist Extremity/Trunk Assessment RUE Assessment RUE Assessment: Within Functional Limits LUE Assessment LUE Assessment: Exceptions to [facility] (shoulder flexion approx 120 degrees, elbow WFL, wrist WFL.  Strength grossly 2+/5 at shoulder, 3/5 at elbow)  See FIM for current  functional status  Nelta Caudill, [facility] 12/02/2014, 12:25 PM

## 2014-12-02 NOTE — Progress Notes (Signed)
Subjective/Complaints: Pt without new issues, po intake is good Review of systems unable to obtain secondary to cognitive status Objective: Vital Signs: Blood pressure 125/71, pulse 84, temperature 98.6 F (37 C), temperature source Oral, resp. rate 17, weight 91.4 kg (201 lb 8 oz), SpO2 98 %. No results found. Results for orders placed or performed during the hospital encounter of 11/05/14 (from the past 72 hour(s))  Basic metabolic panel     Status: None   Collection Time: 12/01/14  6:00 AM  Result Value Ref Range   Sodium 138 135 - 145 mmol/L   Potassium 3.6 3.5 - 5.1 mmol/L   Chloride 106 101 - 111 mmol/L   CO2 22 22 - 32 mmol/L   Glucose, Bld 85 70 - 99 mg/dL   BUN 12 6 - 20 mg/dL   Creatinine, Ser 1.15 0.61 - 1.24 mg/dL   Calcium 9.4 8.9 - 10.3 mg/dL   GFR calc non Af Amer >60 >60 mL/min   GFR calc Af Amer >60 >60 mL/min    Comment: (NOTE) The eGFR has been calculated using the CKD EPI equation. This calculation has not been validated in all clinical situations. eGFR's persistently <60 mL/min signify possible Chronic Kidney Disease.    Anion gap 10 5 - 15     HEENT: normal and extensive gold caps Cardio: RRR and no murmurs Resp: CTA B/L and unlabored GI: BS positive and nontender nondistended Extremity:  No Edema, IV site non tender no erythema or drainage Skin:   Intact Neuro: Lethargic, Flat, Abnormal  Delayed responses Sensory reduced sensation in the left lower extremity greater than left upper extremity to pinch, difficult to assess secondary to affect, Abnormal Motor 5/5 in the right deltoid, biceps, triceps, grip, hip flexor, knee extensor, ankle dorsiflexor and plantar flexor and Inattention 3+/5 in the left deltoid, 3+ minus biceps triceps,3 minus finger flexors, 2-/5  left hip/knee extensor synergy Gen. no acute distress   Assessment/Plan: 1. Functional deficits secondary to right ACA infarct with left hemiparesis as well as cognitive deficits which  require 3+ hours per day of interdisciplinary therapy in a comprehensive inpatient rehab setting. Physiatrist is providing close team supervision and 24 hour management of active medical problems listed below. Physiatrist and rehab team continue to assess barriers to discharge/monitor patient progress toward functional and medical goals. Team conference today please see physician documentation under team conference tab, met with team face-to-face to discuss problems,progress, and goals. Formulized individual treatment plan based on medical history, underlying problem and comorbidities. Plan D/C in am Rec Ritalin for 1 mo then d/c FIM: FIM - Bathing Bathing Steps Patient Completed: Chest, Right Arm, Left Arm, Abdomen, Front perineal area, Buttocks, Right upper leg, Left upper leg Bathing: 4: Steadying assist  FIM - Upper Body Dressing/Undressing Upper body dressing/undressing steps patient completed: Thread/unthread left sleeve of pullover shirt/dress, Put head through opening of pull over shirt/dress, Pull shirt over trunk, Thread/unthread right sleeve of pullover shirt/dresss Upper body dressing/undressing: 4: Min-Patient completed 75 plus % of tasks FIM - Lower Body Dressing/Undressing Lower body dressing/undressing steps patient completed: Thread/unthread right underwear leg, Thread/unthread left underwear leg, Thread/unthread right pants leg, Thread/unthread left pants leg Lower body dressing/undressing: 2: Max-Patient completed 25-49% of tasks  FIM - Toileting Toileting steps completed by patient: Performs perineal hygiene Toileting: 1: Two helpers  FIM - Radio producer Devices: Bedside commode (drop arm BSC) Toilet Transfers: 3-To toilet/BSC: Mod A (lift or lower assist), 3-From toilet/BSC: Mod A (lift  or lower assist)  FIM - Bed/Chair Transfer Bed/Chair Transfer Assistive Devices: Arm rests Bed/Chair Transfer: 4: Supine > Sit: Min A (steadying Pt. >  75%/lift 1 leg), 4: Sit > Supine: Min A (steadying pt. > 75%/lift 1 leg), 3: Bed > Chair or W/C: Mod A (lift or lower assist), 3: Chair or W/C > Bed: Mod A (lift or lower assist)  FIM - Locomotion: Wheelchair Distance: 200 Locomotion: Wheelchair: 0: Activity did not occur FIM - Locomotion: Ambulation Locomotion: Ambulation Assistive Devices: Other (comment) (R wall rail; PT under L arm and assisting L leg; ace-wrap DF assist with shoe cover) Ambulation/Gait Assistance: 1: +2 Total assist, 2: Max assist Locomotion: Ambulation: 0: Activity did not occur  Comprehension Comprehension Mode: Auditory Comprehension: 4-Understands basic 75 - 89% of the time/requires cueing 10 - 24% of the time  Expression Expression Mode: Verbal Expression: 3-Expresses basic 50 - 74% of the time/requires cueing 25 - 50% of the time. Needs to repeat parts of sentences.  Social Interaction Social Interaction: 3-Interacts appropriately 50 - 74% of the time - May be physically or verbally inappropriate.  Problem Solving Problem Solving: 3-Solves basic 50 - 74% of the time/requires cueing 25 - 49% of the time  Memory Memory: 2-Recognizes or recalls 25 - 49% of the time/requires cueing 51 - 75% of the time  Medical Problem List and Plan: 1. Functional deficits secondary to right ACA infarct embolic secondary to unknown source, retrial of Ritalin 2.  DVT Prophylaxis/Anticoagulation: Subcutaneous Lovenox. Monitor platelet counts of any signs of bleeding. Venous Doppler studies negative 3. Pain Management: Tylenol as needed 4. Dysphagia/left side inattention. Mechanical soft diet. Follow-up speech therapy 5. Neuropsych: This patient is capable of making decisions on his own behalf.titrate ritalin for attn , concentration and initiation 6. Skin/Wound Care: nutrition/skin checks 7. Fluids/Electrolytes/Nutrition: encourage po. Checking labs 8. Hypertension with poor medical compliance. Norvasc 10 mg daily, Coreg 12.5  mg twice a day, lisinopril 20 mg daily.  Recheck BMET off HCTZ and lisinopril, stop IVF 9. Urine drug screen positive for marijuana. Counseling 10. Hyperlipidemia. Lipitor  LOS (Days) 27 A FACE TO FACE EVALUATION WAS PERFORMED  KIRSTEINS,ANDREW E 12/02/2014, 9:20 AM

## 2014-12-02 NOTE — Discharge Summary (Signed)
[name]:  Short, [name]               ACCOUNT NO.:  [id number]  MEDICAL RECORD NO.:  [id number]  LOCATION:  [id number]                        FACILITY:  [facility]  PHYSICIAN:  Erick ColaceAndrew E. Kirsteins, M.D.DATE OF BIRTH:  [birth year]  DATE OF ADMISSION:  11/05/2014 DATE OF DISCHARGE:  12/03/2014                              DISCHARGE SUMMARY   DISCHARGE DIAGNOSES: 1. Functional deficits secondary to right anterior cerebral artery     infarct felt to be embolic. 2. Subcutaneous Lovenox for deep vein thrombosis prophylaxis. 3. Dysphagia. 4. Hypertension with poor medical compliance. 5. Urine drug screen positive for marijuana. 6. Hyperlipidemia.  HISTORY OF PRESENT ILLNESS:  This is a 49 year old right-handed male with history of hypertension and remote tobacco abuse on no scheduled medications upon admission, independent prior to admission, living with his girlfriend, working at [employer].  Presented on October 31, 2014, with left-sided weakness and altered mental status.  Noted blood pressure 220/110.  Urine drug screen positive for marijuana.  MRI of the brain showed acute ischemic right ACA territory infarct.  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  Carotid Dopplers with no ICA stenosis.  Echocardiogram with ejection fraction of 35-40% with diffuse hypokinesis.  Venous Doppler studies of the lower extremities negative.  Neurology consulted. The patient did receive tPA.  The patient maintained on aspirin and Plavix for CVA prophylaxis.  Subcutaneous Lovenox for DVT prophylaxis. TEE showed ejection fraction of 35% without embolus or thrombus.  No plan for loop recorder at this time.  Recommendations were for outpatient 30 day monitor.  Blood pressures monitored.  A renal artery duplex showed no evidence of renal artery stenosis.  Physical and occupational therapy ongoing.   The patient was admitted for a comprehensive rehab program.  PAST MEDICAL HISTORY:  See discharge diagnoses.  SOCIAL HISTORY:  Lives with girlfriend, independent, working at [employer].  FUNCTIONAL STATUS:  Upon admission to rehab services was max assist sit to stand, +2 physical assist, overall bed mobility, mod max assist activities of daily living.  PHYSICAL EXAMINATION:  VITAL SIGNS:  Blood pressure 178/100, pulse 71, temperature 98, respirations 18.  GENERAL:  This was an alert male, left side inattention.  He was able to state his name, but needing cues for place.  LUNGS:  Clear to auscultation.  CARDIAC:  Regular rate and rhythm.  ABDOMEN:  Soft, nontender.  Good bowel sounds.  REHABILITATION HOSPITAL COURSE:  The patient was admitted to Inpatient Rehab Services with therapies initiated on a 3-hour daily basis consisting of physical therapy, occupational therapy, speech therapy, and rehabilitation nursing.  The following issues were addressed during the patient's rehabilitation stay.  Pertaining to Mr. Short right ACA infarct felt to be embolic, he remained on aspirin and Plavix therapy. He would follow up Neurology Services.  Venous Doppler studies negative. He had been on subcutaneous Lovenox for DVT prophylaxis.  Trial Ritalin was initiated to help sustain better focus and attention to tasks.  His diet was advanced to a mechanical soft.  Blood pressures controlled with multiple antihypertensive medication and patient.  The patient  and family received full counseling in regard to maintain these medications and would [facility] for ongoing medical management.  Urine drug screen was positive for marijuana, again received full counseling in regard to cessation of illicit drugs as well as alcohol and tobacco.  The patient received weekly collaborative interdisciplinary team conferences to discuss estimated length of stay, family  teaching, and any barriers to discharge.  Sessions focused on education, ambulation, and training with and without the use of the rolling walker to address posture and midline orientation.  Required max assist for all gait activities.  He had been fitted with an AFO brace. Performed car transfers with both daughter and girlfriend.  Activities of daily living.  Girlfriend assisted in bathing, dressing, and grooming.  All issues in regard to the patient's overall care at home as well as safety were discussed with family and plan was discharge to home Dec 03, 2014, with ongoing therapies dictated per rehab services.  DISCHARGE MEDICATIONS: 1. Norvasc 10 mg p.o. daily. 2. Aspirin 81 mg p.o. daily. 3. Lipitor 40 mg p.o. daily. 4. Coreg 12.5 mg p.o. b.i.d. 5. Plavix 75 mg p.o. daily. 6. Voltaren gel 4 times daily to affected area. 7. Lisinopril 20 mg p.o. daily. 8. Ritalin 10 mg p.o. b.i.d. 9. Protonix 40 mg p.o. at bedtime.  DIET:  Mechanical soft.  FOLLOWUP:  The patient would follow up Dr. Claudette LawsAndrew [facility]irsteins at [facility] December 25, 2014, Dr. Roda ShuttersXu, Neurology Services call for appointment 1 month, [name] in regard to 30 day monitor. Appointment to be made by social worker with [facility] for medical management.     [name] Short, P.A.   ______________________________ Erick ColaceAndrew E. Kirsteins, M.D.    DA/MEDQ  D:  12/02/2014  T:  12/02/2014  Job:  [id number]  cc:   Erick ColaceAndrew E. Kirsteins, M.D. Hillis RangeJames Allred, MD Marvel PlanXu Jindong, M.D.

## 2014-12-02 NOTE — Progress Notes (Signed)
Physical Therapy Discharge Summary  Patient Details  Name: [name] Short MRN: [id number] Date of Birth: [birth year]  Today's Date: 12/02/2014 PT Individual Time: 0930-1030 PT Individual Time Calculation (min): 60 min    Patient has met 9 of 10 long term goals due to improved activity tolerance, improved balance, improved postural control, increased strength, ability to compensate for deficits and improved attention.  Patient to discharge at a wheelchair level [place].   Patient's care partner is independent to provide the necessary physical and cognitive assistance at discharge.  Reasons goals not met: Pt did not meet stair goal as pt has level entry to home, therefore did not assess stairs with family at this time.   Recommendation:  Patient will benefit from ongoing skilled PT services in home health setting to continue to advance safe functional mobility, address ongoing impairments in decreased balance, decreased awareness, decreased attention, decreased functional use of LUE/LE, and minimize fall risk.  Equipment: w/c, cushion, L half lap tray  Reasons for discharge: treatment goals met and discharge from hospital  Patient/family agrees with progress made and goals achieved: Yes   PT Treatment/Intervention:  Skilled session focused on grad day activities and ensuring pt has met goals for safe D/C home.  Performed car transfer, furniture transfer, and bed<>w/c transfer at mod A level with continued cues for forward trunk lean, sequencing and set up of w/c.  Performed dynamic standing balance at min to mod A level (depending on reach outside of BOS), dynamic sitting balance at min to close S level.  Ambulated in hallway with use of R rail at max A level with assist to advance LLE fully, stabilize LLE, provide facilitation for weight shift and upright posture/trunk control and performed stairs 4, 6" steps with R handrail at max A level with facilitation as above with cues for stepping  sequence and safety.   GF and daughter present near end of session wanting to practice transfer to bedside commode.  Daughter performed with pt as above and then wanting to to switch with GF to perform "toileting" aspect.  Questioned what daughter was going to do when pt needed to toilet and GF was away at work.  Educated that she will have to perform this with her father but let him do as much as he can in order to keep some privacy.  Daughter verbalized understanding.  Pt left in w/c with family present waiting on OT to arrive.    PT Discharge Precautions/Restrictions Precautions Precautions: Fall Precaution Comments: Rt gaze preference, decreased attention to Lt Restrictions Weight Bearing Restrictions: No   Pain Pain Assessment Pain Assessment: No/denies pain Pain Score: 0-No pain Vision/Perception   See OT note. Cognition Overall Cognitive Status: Impaired/Different from baseline Arousal/Alertness: Awake/alert Orientation Level: Oriented X4 Attention: Sustained Focused Attention: Appears intact Sustained Attention: Impaired Sustained Attention Impairment: Verbal basic;Functional basic Memory: Impaired Memory Impairment: Decreased recall of new information Awareness: Impaired Awareness Impairment: Intellectual impairment Problem Solving: Impaired Problem Solving Impairment: Verbal basic;Functional basic Safety/Judgment: Impaired Sensation Sensation Light Touch: Impaired by gross assessment Stereognosis: Not tested Hot/Cold: Not tested Proprioception: Impaired by gross assessment Additional Comments: Pt able to feel light touch in LUE/LE, however is very diminished compared to R Coordination Gross Motor Movements are Fluid and Coordinated: No Fine Motor Movements are Fluid and Coordinated: No Heel Shin Test: unable to asses due to LLE weakness Motor  Motor Motor: Hemiplegia;Abnormal postural alignment and control Motor - Discharge Observations: L hemiplegia, decreased  balance, decreased trunk control  Mobility  Bed Mobility Bed Mobility: Supine to Sit;Sit to Supine Supine to Sit: 4: Min assist Supine to Sit Details: Verbal cues for sequencing;Verbal cues for technique;Verbal cues for precautions/safety;Manual facilitation for weight shifting Sit to Supine: 4: Min assist Sit to Supine - Details: Verbal cues for sequencing;Verbal cues for technique;Verbal cues for precautions/safety;Manual facilitation for weight shifting Transfers Transfers: Yes Sit to Stand: 3: Mod assist Sit to Stand Details: Verbal cues for sequencing;Verbal cues for technique;Verbal cues for precautions/safety;Manual facilitation for weight shifting;Manual facilitation for weight bearing Stand to Sit: 3: Mod assist Stand to Sit Details (indicate cue type and reason): Verbal cues for sequencing;Verbal cues for technique;Verbal cues for precautions/safety;Manual facilitation for weight shifting;Manual facilitation for weight bearing Squat Pivot Transfers: 3: Mod assist Squat Pivot Transfer Details: Verbal cues for sequencing;Verbal cues for technique;Manual facilitation for weight shifting;Verbal cues for precautions/safety;Manual facilitation for placement Locomotion  Ambulation Ambulation: Yes Ambulation/Gait Assistance: 2: Max assist Ambulation Distance (Feet): 30 Feet Assistive device: Other (Comment) (R handrail) Gait Gait: Yes Gait Pattern: Impaired Gait Pattern: Step-through pattern;Decreased stance time - left;Decreased step length - right;Decreased stride length;Trunk flexed;Poor foot clearance - left;Lateral trunk lean to left;Decreased weight shift to right;Decreased hip/knee flexion - left;Decreased dorsiflexion - left Stairs / Additional Locomotion Stairs: Yes Stairs Assistance: 2: Max Industrial/product designer Assistance Details: Verbal cues for sequencing;Verbal cues for technique;Verbal cues for precautions/safety;Manual facilitation for weight shifting;Verbal cues for gait  pattern;Manual facilitation for placement;Manual facilitation for weight bearing Stair Management Technique: One rail Right;Step to pattern;Forwards Number of Stairs: 4 Height of Stairs: 6 Wheelchair Mobility Wheelchair Mobility: Yes Wheelchair Assistance: 4: Min guard;4: Advertising account executive Details: Verbal cues for sequencing;Verbal cues for technique;Verbal cues for precautions/safety;Tactile cues for initiation Wheelchair Propulsion: Right lower extremity;Right upper extremity Wheelchair Parts Management: Needs assistance Distance: 200  Trunk/Postural Assessment  Cervical Assessment Cervical Assessment: Exceptions to [facility] Cervical Strength Overall Cervical Strength Comments: Pt demonstrated R lateral flexion/rotation and R gaze due to L inattention Thoracic Assessment Thoracic Assessment: Within Functional Limits Lumbar Assessment Lumbar Assessment: Exceptions to [facility] Lumbar Strength Overall Lumbar Strength Comments: pt with increased posterior pelvic tilt in sitting and standing.  Postural Control Postural Control: Deficits on evaluation Trunk Control: Impaired in seated; multidirectional LOB Righting Reactions: Pt with decreased righting reactions when leaning laterally.  Postural Limitations: Bearing majority of weight on L side of pelvis; R side shortened, L elongated   Balance Balance Balance Assessed: Yes Static Sitting Balance Static Sitting - Balance Support: Bilateral upper extremity supported;Feet supported Static Sitting - Level of Assistance: 5: Stand by assistance Dynamic Sitting Balance Dynamic Sitting - Balance Support: Feet supported Dynamic Sitting - Level of Assistance: 5: Stand by assistance;4: Min assist Dynamic Sitting - Balance Activities: Forward lean/weight shifting Static Standing Balance Static Standing - Balance Support: During functional activity Static Standing - Level of Assistance: 4: Min assist Dynamic Standing Balance Dynamic  Standing - Balance Support: During functional activity Dynamic Standing - Level of Assistance: 3: Mod assist;4: Min assist Extremity Assessment      RLE Assessment RLE Assessment: Within Functional Limits LLE Assessment LLE Assessment: Within Functional Limits LLE Strength LLE Overall Strength: Deficits LLE Overall Strength Comments: trace hip flex, trace hip ext, 2-/5 knee ext, 1/5 knee ext, 0/5 PF/DF  See FIM for current functional status  Denice Bors 12/02/2014, 12:13 PM

## 2014-12-02 NOTE — Progress Notes (Signed)
Occupational Therapy Session Note  Patient Details  Name: [name] Short MRN: [id number] Date of Birth: 09/04/1965  Today's Date: 12/02/2014 OT Individual Time: 1117-1202 OT Individual Time Calculation (min): 45 min    Short Term Goals: Week 4:  OT Short Term Goal 1 (Week 4): STG = LTGs due to remaining LOS  Skilled Therapeutic Interventions/Progress Updates:    Completed family education with pt's girlfriend and daughter with focus on toilet transfers.  Upon arrival pt's girlfriend reports that they had already completed bathing and dressing, able to FIM based on report.  Girlfriend brought picture of bathroom setup and will be able to get w/c into bathroom and position for safe squat pivot transfers.  Pt propelled w/c half way to ADL apt bathroom with hemi-technique then pushed rest of way. Positioned drop arm BSC over toilet and had pt's girlfriend and daughter perform squat pivot transfers x3 each with mod cues faded to supervision.  Re-educated on over the back technique to promote forward weight shift to increase pt participation in transfer.  Both report ready for d/c tomorrow.  Therapy Documentation Precautions:  Precautions Precautions: Fall Precaution Comments: Rt gaze preference, decreased attention to Lt Restrictions Weight Bearing Restrictions: No Pain: Pain Assessment Pain Assessment: No/denies pain Pain Score: 0-No pain  See FIM for current functional status  Therapy/Group: Individual Therapy  Rosalio LoudHOXIE, Lodie Waheed 12/02/2014, 12:13 PM

## 2014-12-02 NOTE — Discharge Instructions (Signed)
Inpatient Rehab Discharge Instructions  [name] NethCarl Short Discharge date and time: No discharge date for patient encounter.   Activities/Precautions/ Functional Status: Activity: activity as tolerated Diet: soft Wound Care: none needed Functional status:  ___ No restrictions     ___ Walk up steps independently _x STROKE/TIA DISCHARGE INSTRUCTIONS SMOKING Cigarette smoking nearly doubles your risk of having a stroke & is the single most alterable risk factor  If you smoke or have smoked in the last 12 months, you are advised to quit smoking for your health.  Most of the excess cardiovascular risk related to smoking disappears within a year of stopping.  Ask you doctor about anti-smoking medications  [name] Short Quit Line: 1-800-QUIT NOW  Free Smoking Cessation Classes [phone]  CHOLESTEROL Know your levels; limit fat & cholesterol in your diet  Lipid Panel     Component Value Date/Time   CHOL 164 11/01/2014 0601   TRIG 72 11/01/2014 0601   HDL 28* 11/01/2014 0601   CHOLHDL 5.9 11/01/2014 0601   VLDL 14 11/01/2014 0601   LDLCALC 122* 11/01/2014 0601      Many patients benefit from treatment even if their cholesterol is at goal.  Goal: Total Cholesterol (CHOL) less than 160  Goal:  Triglycerides (TRIG) less than 150  Goal:  HDL greater than 40  Goal:  LDL (LDLCALC) less than 100   BLOOD PRESSURE American Stroke Association blood pressure target is less that 120/80 mm/Hg  Your discharge blood pressure is:  BP: 126/72 mmHg  Monitor your blood pressure  Limit your salt and alcohol intake  Many individuals will require more than one medication for high blood pressure  DIABETES (A1c is a blood sugar average for last 3 months) Goal HGBA1c is under 7% (HBGA1c is blood sugar average for last 3 months)  Diabetes: No known diagnosis of diabetes    Lab Results  Component Value Date   HGBA1C 5.8* 11/01/2014     Your HGBA1c can be lowered with medications, healthy diet, and  exercise.  Check your blood sugar as directed by your physician  Call your physician if you experience unexplained or low blood sugars.  PHYSICAL ACTIVITY/REHABILITATION Goal is 30 minutes at least 4 days per week  Activity: Increase activity slowly, Therapies: Physical Therapy: Home Health Return to work:   Activity decreases your risk of heart attack and stroke and makes your heart stronger.  It helps control your weight and blood pressure; helps you relax and can improve your mood.  Participate in a regular exercise program.  Talk with your doctor about the best form of exercise for you (dancing, walking, swimming, cycling).  DIET/WEIGHT Goal is to maintain a healthy weight  Your discharge diet is: DIET DYS 3 Room service appropriate?: Yes; Fluid consistency:: Thin  liquids Your height is:    Your current weight is: Weight: 91.4 kg (201 lb 8 oz) Your Body Mass Index (BMI) is:     Following the type of diet specifically designed for you will help prevent another stroke.  Your goal weight range is:    Your goal Body Mass Index (BMI) is 19-24.  Healthy food habits can help reduce 3 risk factors for stroke:  High cholesterol, hypertension, and excess weight.  RESOURCES Stroke/Support Group:  Call [phone]   STROKE EDUCATION PROVIDED/REVIEWED AND GIVEN TO PATIENT Stroke warning signs and symptoms How to activate emergency medical system (call 911). Medications prescribed at discharge. Need for follow-up after discharge. Personal risk factors for stroke. Pneumonia vaccine given:  Flu vaccine given:  My questions have been answered, the writing is legible, and I understand these instructions.  I will adhere to these goals & educational materials that have been provided to me after my discharge from the hospital.    __ 24/7 supervision/assistance   ___ Walk up steps with assistance ___ Intermittent supervision/assistance  ___ Bathe/dress independently ___ Walk with  walker     ___ Bathe/dress with assistance ___ Walk Independently    ___ Shower independently _x__ Walk with assistance    ___ Shower with assistance ___ No alcohol     ___ Return to work/school ________  Special Instructions: Follow-up with cardiology services in regards to 30 day monitor [name] Short [phone]    COMMUNITY REFERRALS UPON DISCHARGE:    Home Health:   PT, OT, SP, RN, SW  Agency:[facility] [phone]   Date of last service:12/03/2014  Medical Equipment/Items Ordered:WHEELCHAIR & Rolland BimlerDROP-ARM BEDSIDE COMMODE  Agency/Supplier:[facility]    [phone] [facility] 5/16 3;00 pm  GENERAL COMMUNITY RESOURCES FOR PATIENT/FAMILY: Support Groups:[facility] SUPPORT GROUP  My questions have been answered and I understand these instructions. I will adhere to these goals and the provided educational materials after my discharge from the hospital.  Patient/Caregiver Signature _______________________________ Date __________  Clinician Signature _______________________________________ Date __________  Please bring this form and your medication list with you to all your follow-up doctor's appointments.

## 2014-12-02 NOTE — Patient Care Conference (Signed)
Inpatient RehabilitationTeam Conference and Plan of Care Update Date: 12/02/2014   Time: 11;20 AM    Patient Name: [name] Short      Medical Record Number: [id number]  Date of Birth: [birth year] Sex: Male         Room/Bed: 4W11C/4W11C-01 Payor Info: Payor: MEDICAID PENDING / Plan: MEDICAID PENDING / Product Type: *No Product type* /    Admitting Diagnosis: R ACA infarct   Admit Date/Time:  11/05/2014  4:33 PM Admission Comments: No comment available   Primary Diagnosis:  <principal problem not specified> Principal Problem: <principal problem not specified>  Patient Active Problem List   Diagnosis Date Noted  . Secondary cardiomyopathy 11/05/2014  . Malignant hypertension 11/05/2014  . Hyperlipidemia LDL goal <70 11/05/2014  . Cigarette smoker 11/05/2014  . Marijuana abuse 11/05/2014  . Hypokalemia 11/05/2014  . Left hemiparesis 11/05/2014  . Cognitive deficit, post-stroke 11/05/2014  . Left-sided weakness   . Acute right anterior cerebral artery (ACA) ischemic stroke 10/31/2014    Expected Discharge Date: Expected Discharge Date: 12/03/14  Team Members Present: Physician leading conference: Dr. Claudette LawsAndrew Short Social Worker Present: [name] DerBecky Roshell Brigham, LCSW Nurse Present: [name] EndAngie Joyce, RN PT Present: [name] Short, PT;[name] Short, PT OT Present: [name] Short, [name] Short;[name] Short, OT SLP Present: [name] Short, SLP PPS Coordinator present : [name] DuckMarie Noel, RN, CRRN     Current Status/Progress Goal Weekly Team Focus  Medical   Initiation somewhat improved on Ritalin, by mouth intake improved  meeting needs by mouth  Home with family support  Discharge planning   Bowel/Bladder   Incontinent of bowel x 1 during HS. continent of bladder. LBM 12/02/14  continent of bowel and bladder at al times  Limit fluids after 7pm, timed toileting q 2-3 hrs   Swallow/Nutrition/ Hydration   dys 3 textures and thin liquids, full supervision for cognition   supervision with least restrictive diet    completion of family education prior to discharge    ADL's   min-mod assist with bathing and dressing tasks, mod assist squat pivot transfers.  Pt with improved participation and initiation this week.  supervision grooming and UB dressing, min assist bathing, mod assist toileting, toilet transfers, and LB dressing  family education, transfers, standing balance, LUE NMR   Mobility   Pt currently min A bed mobility (w/ increased time for initiation), mod A squat pivot transfers, mod to max A standing balance.  Have began extensive family training and education with GF and daughter.   min to mod A overall from w/c level  L NMR, transfers, decreasing pusher tendencies, mildline orientation, gait, attention, pt/family education   Communication             Safety/Cognition/ Behavioral Observations  mod assist for basic tasks   min-mod for basic   completion of family education prior to discharge.     Pain   No c/o pain  <3 on a 0-10 scale  assess for pain q 4hr and as needed   Skin   dry, flaky skin specifically to bilateral feet  No skin breakdown while on rehab  assess skin q shift      *See Care Plan and progress notes for long and short-term goals.  Barriers to Discharge: Left neglect, severe cognitive deficits, left hemiparesis    Possible Resolutions to Barriers:  Home health therapy, physical medicine rehabilitation follow-up    Discharge Planning/Teaching Needs:  Family has been training all week in preparation for discharge tomorrow. Equipment and Home healt follow up being  arranged.      Team Discussion:  Better this week with ritalin-more alert.  Eating better and drinking-dc IV. Extensive family education this week and ready for discharge tomorrow. BP elevated watching this.  Revisions to Treatment Plan:  None   Continued Need for Acute Rehabilitation Level of Care: The patient requires daily medical management by a physician with specialized training in physical medicine  and rehabilitation for the following conditions: Daily direction of a multidisciplinary physical rehabilitation program to ensure safe treatment while eliciting the highest outcome that is of practical value to the patient.: Yes Daily medical management of patient stability for increased activity during participation in an intensive rehabilitation regime.: Yes Daily analysis of laboratory values and/or radiology reports with any subsequent need for medication adjustment of medical intervention for : Neurological problems;Other  [name] Short, [name] Short 12/02/2014, 1:58 PM

## 2014-12-02 NOTE — Discharge Summary (Signed)
Discharge summary job # [id number]

## 2014-12-02 NOTE — Plan of Care (Signed)
Problem: RH Memory Goal: LTG Patient will follow step by step directions w/cues (SLP) LTG: Patient will follow step by step directions with cues (SLP).  Outcome: Not Met (add Reason) Pt can follow 1 and 2 step commands with min assist  Problem: RH Awareness Goal: LTG: Patient will demonstrate intellectual/emergent (SLP) LTG: Patient will demonstrate intellectual/emergent/anticipatory awareness with assist during a cognitive/linguistic activity (SLP)  Outcome: Not Met (add Reason) Pt requires min-mod assist for intellectual awareness of deficits

## 2014-12-03 MED ORDER — METHYLPHENIDATE HCL 10 MG PO TABS: 10.0000 mg | ORAL_TABLET | Freq: Two times a day (BID) | ORAL | Status: DC

## 2014-12-03 MED ORDER — DICLOFENAC SODIUM 1 % TD GEL: 2.0000 g | Freq: Four times a day (QID) | TRANSDERMAL | Status: DC

## 2014-12-03 MED ORDER — AMLODIPINE BESYLATE 10 MG PO TABS: 10.0000 mg | ORAL_TABLET | Freq: Every day | ORAL | Status: DC

## 2014-12-03 MED ORDER — LISINOPRIL 20 MG PO TABS: 20.0000 mg | ORAL_TABLET | Freq: Every day | ORAL | Status: DC

## 2014-12-03 MED ORDER — ATORVASTATIN CALCIUM 40 MG PO TABS: 40.0000 mg | ORAL_TABLET | Freq: Every day | ORAL | Status: DC

## 2014-12-03 MED ORDER — CLOPIDOGREL BISULFATE 75 MG PO TABS: 75.0000 mg | ORAL_TABLET | Freq: Every day | ORAL | Status: DC

## 2014-12-03 MED ORDER — CARVEDILOL 12.5 MG PO TABS: 12.5000 mg | ORAL_TABLET | Freq: Two times a day (BID) | ORAL | Status: DC

## 2014-12-03 MED ORDER — ASPIRIN 81 MG PO TBEC: 81.0000 mg | DELAYED_RELEASE_TABLET | Freq: Every day | ORAL | Status: DC

## 2014-12-03 NOTE — Progress Notes (Signed)
Deatra Inaan Angiulli, PA-C gave discharge instructions to patient and family; pt and family verbalized understanding. All equipment provided, patient and family escorted to their car with all possessions.

## 2014-12-03 NOTE — Progress Notes (Signed)
Subjective/Complaints: Good night  Objective: Vital Signs: Blood pressure 139/87, pulse 57, temperature 98.7 F (37.1 C), temperature source Oral, resp. rate 18, weight 92.1 kg (203 lb 0.7 oz), SpO2 100 %. No results found. Results for orders placed or performed during the hospital encounter of 11/05/14 (from the past 72 hour(s))  Basic metabolic panel     Status: None   Collection Time: 12/01/14  6:00 AM  Result Value Ref Range   Sodium 138 135 - 145 mmol/L   Potassium 3.6 3.5 - 5.1 mmol/L   Chloride 106 101 - 111 mmol/L   CO2 22 22 - 32 mmol/L   Glucose, Bld 85 70 - 99 mg/dL   BUN 12 6 - 20 mg/dL   Creatinine, Ser 1.15 0.61 - 1.24 mg/dL   Calcium 9.4 8.9 - 10.3 mg/dL   GFR calc non Af Amer >60 >60 mL/min   GFR calc Af Amer >60 >60 mL/min    Comment: (NOTE) The eGFR has been calculated using the CKD EPI equation. This calculation has not been validated in all clinical situations. eGFR's persistently <60 mL/min signify possible Chronic Kidney Disease.    Anion gap 10 5 - 15     HEENT: normal and extensive gold caps Cardio: RRR and no murmurs Resp: CTA B/L and unlabored GI: BS positive and nontender nondistended Extremity:  No Edema, IV site non tender no erythema or drainage Skin:   Intact Neuro: Lethargic, Flat, Abnormal  Delayed responses Sensory reduced sensation in the left lower extremity greater than left upper extremity to pinch, difficult to assess secondary to affect, Abnormal Motor 5/5 in the right deltoid, biceps, triceps, grip, hip flexor, knee extensor, ankle dorsiflexor and plantar flexor and Inattention 3+/5 in the left deltoid, 3+ minus biceps triceps,3 minus finger flexors, 2-/5  left hip/knee extensor synergy Gen. no acute distress   Assessment/Plan: 1. Functional deficits secondary to right ACA infarct Stable for D/C today F/u PCP in 1-2 weeks F/u PM&R 3 weeks See D/C summary See D/C instructions Rec Ritalin for 1 mo then d/c FIM: FIM -  Bathing Bathing Steps Patient Completed: Chest, Right Arm, Left Arm, Abdomen, Front perineal area, Buttocks, Right upper leg, Left upper leg Bathing: 4: Steadying assist  FIM - Upper Body Dressing/Undressing Upper body dressing/undressing steps patient completed: Thread/unthread left sleeve of pullover shirt/dress, Put head through opening of pull over shirt/dress, Pull shirt over trunk, Thread/unthread right sleeve of pullover shirt/dresss Upper body dressing/undressing: 5: Set-up assist to: Obtain clothing/put away FIM - Lower Body Dressing/Undressing Lower body dressing/undressing steps patient completed: Thread/unthread right underwear leg, Thread/unthread left underwear leg, Thread/unthread right pants leg, Thread/unthread left pants leg, Pull pants up/down, Don/Doff right sock Lower body dressing/undressing: 3: Mod-Patient completed 50-74% of tasks  FIM - Toileting Toileting steps completed by patient: Adjust clothing prior to toileting, Performs perineal hygiene Toileting Assistive Devices: Grab bar or rail for support Toileting: 3: Mod-Patient completed 2 of 3 steps  FIM - Radio producer Devices: Bedside commode, Grab bars Toilet Transfers: 3-To toilet/BSC: Mod A (lift or lower assist), 3-From toilet/BSC: Mod A (lift or lower assist), 1-Mechanical lift (Stedy)  FIM - Control and instrumentation engineer Devices: Arm rests Bed/Chair Transfer: 4: Supine > Sit: Min A (steadying Pt. > 75%/lift 1 leg), 6: Sit > Supine: No assist, 3: Bed > Chair or W/C: Mod A (lift or lower assist), 3: Chair or W/C > Bed: Mod A (lift or lower assist)  FIM - Locomotion: Wheelchair  Distance: 200 Locomotion: Wheelchair: 4: Travels 150 ft or more: maneuvers on rugs and over door sillls with minimal assistance (Pt.>75%) FIM - Locomotion: Ambulation Locomotion: Ambulation Assistive Devices: Other (comment) (R handrail) Ambulation/Gait Assistance: 2: Max  assist Locomotion: Ambulation: 1: Travels less than 50 ft with maximal assistance (Pt: 25 - 49%)  Comprehension Comprehension Mode: Auditory Comprehension: 4-Understands basic 75 - 89% of the time/requires cueing 10 - 24% of the time  Expression Expression Mode: Verbal Expression: 4-Expresses basic 75 - 89% of the time/requires cueing 10 - 24% of the time. Needs helper to occlude trach/needs to repeat words.  Social Interaction Social Interaction: 4-Interacts appropriately 75 - 89% of the time - Needs redirection for appropriate language or to initiate interaction.  Problem Solving Problem Solving: 3-Solves basic 50 - 74% of the time/requires cueing 25 - 49% of the time  Memory Memory: 3-Recognizes or recalls 50 - 74% of the time/requires cueing 25 - 49% of the time  Medical Problem List and Plan: 1. Functional deficits secondary to right ACA infarct embolic secondary to unknown source, retrial of Ritalin 2.  DVT Prophylaxis/Anticoagulation: Subcutaneous Lovenox. Monitor platelet counts of any signs of bleeding. Venous Doppler studies negative 3. Pain Management: Tylenol as needed 4. Dysphagia/left side inattention. Mechanical soft diet. Follow-up speech therapy 5. Neuropsych: This patient is capable of making decisions on his own behalf.titrate ritalin for attn , concentration and initiation 6. Skin/Wound Care: nutrition/skin checks 7. Fluids/Electrolytes/Nutrition: encourage po. Checking labs 8. Hypertension with poor medical compliance. Norvasc 10 mg daily, Coreg 12.5 mg twice a day, lisinopril 20 mg daily.  Recheck BMET off HCTZ and lisinopril, stop IVF 9. Urine drug screen positive for marijuana. Counseling 10. Hyperlipidemia. Lipitor  LOS (Days) 28 A FACE TO FACE EVALUATION WAS PERFORMED  Yoshua [facility] E 12/03/2014, 8:54 AM

## 2014-12-03 NOTE — Progress Notes (Signed)
Social Work Patient ID: [name] Short, male   DOB: October 08, 1965, 49 y.o.   MRN: [id number] Completed [employer] application and faxed in, [name] aware they will contact her to set up interview.

## 2014-12-03 NOTE — Progress Notes (Signed)
Social Work Discharge Note Discharge Note  The overall goal for the admission was met for:   Discharge location: Yes-HOME WITH GIRLFRIEND AND DAUGHTER TO ASSIST WHILE GIRLFRIEND WORKS-24 [employer]  Length of Stay: Yes-28 DAYS  Discharge activity level: Yes-MIN/MOD WHEELCHAIR LEVEL  Home/community participation: Yes  Services provided included: MD, RD, PT, OT, SLP, RN, CM, TR, Pharmacy, Neuropsych and SW  Financial Services: Other: PENDING MEDICAID  Follow-up services arranged: Home Health: [facility]-PT,OT,SP,RN,SW, DME: [place] and Patient/Family has no preference for HH/DME agencies  Comments (or additional information):EXTENSIVE FAMILY EDUCATION WITH GIRLFRIEND & DAUGHTER ALL WEEK AND BOTH FEEL PREPARED FOR DISCHARGE TODAY. [place] APPT MADE 5/16 @ 3;00 PM. DISABILITY AND MEDICAID APPLICATIONS PENDING. MATCH PROGRAM GIVEN TO PT FOR MEDICATION ASSISTANCE.  [place] THEN TRANSITION TO OP THERAPIES  Patient/Family verbalized understanding of follow-up arrangements: Yes  Individual responsible for coordination of the follow-up plan: [name]-GIRLFRIEND & TIFFANY-DAUGHTER  Confirmed correct DME delivered: Elease Hashimoto 12/03/2014    Elease Hashimoto

## 2014-12-07 ENCOUNTER — Ambulatory Visit: Payer: Medicaid Other | Attending: Family Medicine | Admitting: Family Medicine

## 2014-12-07 ENCOUNTER — Encounter: Payer: Self-pay | Admitting: Family Medicine

## 2014-12-07 VITALS — BP 145/92 | HR 86 | Temp 98.6°F | Resp 18 | Ht 70.0 in

## 2014-12-07 DIAGNOSIS — G4700 Insomnia, unspecified: Secondary | ICD-10-CM

## 2014-12-07 DIAGNOSIS — I10 Essential (primary) hypertension: Secondary | ICD-10-CM

## 2014-12-07 DIAGNOSIS — E876 Hypokalemia: Secondary | ICD-10-CM

## 2014-12-07 DIAGNOSIS — I63521 Cerebral infarction due to unspecified occlusion or stenosis of right anterior cerebral artery: Secondary | ICD-10-CM | POA: Diagnosis not present

## 2014-12-07 DIAGNOSIS — G8190 Hemiplegia, unspecified affecting unspecified side: Secondary | ICD-10-CM

## 2014-12-07 DIAGNOSIS — I429 Cardiomyopathy, unspecified: Secondary | ICD-10-CM

## 2014-12-07 DIAGNOSIS — G8194 Hemiplegia, unspecified affecting left nondominant side: Secondary | ICD-10-CM

## 2014-12-07 MED ORDER — ATORVASTATIN CALCIUM 40 MG PO TABS: 40.0000 mg | ORAL_TABLET | Freq: Every day | ORAL | Status: DC

## 2014-12-07 MED ORDER — AMLODIPINE BESYLATE 10 MG PO TABS: 10.0000 mg | ORAL_TABLET | Freq: Every day | ORAL | Status: DC

## 2014-12-07 MED ORDER — CLOPIDOGREL BISULFATE 75 MG PO TABS: 75.0000 mg | ORAL_TABLET | Freq: Every day | ORAL | Status: DC

## 2014-12-07 MED ORDER — LISINOPRIL 20 MG PO TABS: 20.0000 mg | ORAL_TABLET | Freq: Every day | ORAL | Status: DC

## 2014-12-07 MED ORDER — CARVEDILOL 12.5 MG PO TABS: 12.5000 mg | ORAL_TABLET | Freq: Two times a day (BID) | ORAL | Status: DC

## 2014-12-07 MED ORDER — TRAZODONE HCL 50 MG PO TABS
50.0000 mg | ORAL_TABLET | Freq: Every evening | ORAL | Status: DC | PRN
Start: 2014-12-07 — End: 2015-03-02

## 2014-12-07 MED ORDER — ASPIRIN 81 MG PO TBEC: 81.0000 mg | DELAYED_RELEASE_TABLET | Freq: Every day | ORAL | Status: DC

## 2014-12-07 NOTE — Progress Notes (Signed)
Subjective:    Patient ID: [name] Short, male    DOB: 13-Jan-1966, 49 y.o.   MRN: [id number]  HPI  Admit date: 11/05/14 Discharge date: 12/03/14  [name] Short had presented to [facility] with left-sided weakness and altered mental status and was noticed to have a blood pressure of 220/110.   Urine drug screen was positive for marijuana he had an MRI of his brain which showed an acute ischemic right ACA territory infarct. 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 Carotid Dopplers revealed no ICA stenosis. Echocardiogram with ejection fraction of 35-40% with diffuse hypokinesis. Venous Doppler studies of the lower extremities negative. Neurology was consulted and he received TPA.TEE was negative for thrombus or embolus.  He was commenced on antihypertensives and subsequently transferred for for comprehensive inpatient rehabilitation  Interval History: Has had insomnia , goes to bed at 11pm and doesn't fall asleep. States he received trazodone during hospitalization which helped.  He also complains that he is concerned he would not be able to walk again; he is still weak on his left side and sits on his wheelchair most of the time. Currently receiving physical therapy at home and he scheduled to see rehabilitation on 01/21/15, cardiology on 12/16/14 for a event monitor placement and Neurology in 02/04/15.  Past Medical History  Diagnosis Date  . Hypertension     a. Previously on meds but none in several years.  . Smoker     a. Previous tobacco - quit ~ 2013. Ongoing daily marijuana usage. Occasional cigar.  . Stroke     a. 10/2014 MRI Head: acute mod size nonhemorrhagic R ant cerebral artery distribution infarct involving the medial aspect of the right frontal and parietal lobe and right aspect of the corpus callosum w/ local mass effect-->TPA;  b. 10/2014 MRA:  Abrupt cut off of the R ACA A2 segment;  b. 10/2014 Carotid U/S: 1-39% bilat ICA stenosis.  . Cardiomyopathy     a. 10/2014 Echo: EF 35-40%, diff HK, sev LVH, mod dil LA, PASP 45mmHg.    Past Surgical History  Procedure Laterality Date  . Tee without cardioversion N/A 11/03/2014    Procedure: TRANSESOPHAGEAL ECHOCARDIOGRAM (TEE);  Surgeon: Laurey Moralealton S McLean, MD;  [facility];  Service: Cardiovascular;  Laterality: N/A;    Family History  Problem Relation Age of Onset  . Other      no premature cad  . Diabetes Mother   . Heart disease Father     History   Social History  . Marital Status: Single    Spouse Name: N/A  . Number of Children: N/A  . Years of Education: N/A   Occupational History  . Not on file.   Social History Main Topics  . Smoking status: Former Smoker    Types: Cigars    Quit date: 10/31/2014  . Smokeless tobacco: Not on file     Comment: previously smoked cigarettes - quit ~ 2013.  Marland Kitchen. Alcohol Use: 0.0 oz/week    0 Standard drinks or equivalent per week     Comment: occasional  . Drug Use: Yes     Comment: Marijuana daily. Has not had marijuana since being in hospital.  . Sexual Activity: Not on file   Other Topics Concern  . Not on file   Social History Narrative   [place] in [place] his GF.  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    No Known Allergies  Current Outpatient Prescriptions on File Prior to Visit  Medication Sig Dispense Refill  . diclofenac sodium (VOLTAREN) 1 % GEL Apply 2 g topically 4 (four) times daily. 1 Tube 1  . methylphenidate (RITALIN) 10 MG tablet Take 1 tablet (10 mg total) by mouth 2 (two) times daily with breakfast and lunch. 90 tablet 0   No current facility-administered medications on file prior to visit.    Review of Systems  Constitutional: Negative for activity change and appetite change.  HENT: Negative for sinus pressure and sore throat.   Eyes: Negative for visual disturbance.  Respiratory:  Negative for chest tightness and shortness of breath.   Cardiovascular: Negative for chest pain and palpitations.  Gastrointestinal: Negative for abdominal pain and abdominal distention.  Endocrine: Negative for cold intolerance, heat intolerance and polyphagia.  Genitourinary: Negative for dysuria, frequency and difficulty urinating.  Musculoskeletal:       See hpi  Skin: Negative for color change.  Neurological:       See hpi  Psychiatric/Behavioral: Negative for suicidal ideas.       Insomnia        Objective: Filed Vitals:   12/07/14 1459  BP: 145/92  Pulse: 86  Temp: 98.6 F (37 C)  Resp: 18      Physical Exam  Constitutional: He is oriented to person, place, and time. He appears well-developed and well-nourished.  HENT:  Head: Normocephalic and atraumatic.  Right Ear: External ear normal.  Left Ear: External ear normal.  Eyes: Conjunctivae and EOM are normal. Pupils are equal, round, and reactive to light.  Neck: Normal range of motion. Neck supple. No tracheal deviation present.  Cardiovascular: Normal rate, regular rhythm and normal heart sounds.   No murmur heard. Pulmonary/Chest: Effort normal and breath sounds normal. No respiratory distress. He has no wheezes. He exhibits no tenderness.  Abdominal: Soft. Bowel sounds are normal. He exhibits no mass. There is no tenderness.  Musculoskeletal: He exhibits no edema or tenderness.  Neurological: He is alert and oriented to person, place, and time.  Motor strength: Left upper extremity-4/5, left lower extremity-1/5 Right upper extremity-5/5, right lower extremity-5/5.  Skin: Skin is warm and dry.  Psychiatric: He has a normal mood and affect.     GAD7 score :18, PHQ9 score :15  EXAM: MRI HEAD WITHOUT CONTRAST  MRA HEAD WITHOUT CONTRAST  TECHNIQUE: Multiplanar, multiecho pulse sequences of the brain and surrounding structures were obtained without intravenous contrast. Angiographic images of the head  were obtained using MRA technique without contrast.  COMPARISON: 10/31/2014 brain MR and head CT.  FINDINGS: MRI HEAD FINDINGS  Acute moderate size nonhemorrhagic right anterior cerebral artery distribution infarct involving the medial aspect of the right frontal and parietal lobe and right aspect of the corpus callosum. Local mass effect.  Tiny blood breakdown products cerebellum bilaterally may reflect result of prior hemorrhagic ischemia or prior trauma.  Moderate white matter type changes periventricular region advanced for patient's age and most likely related to result of small vessel disease in this hypertensive obese patient. Other causes white matter type changes felt to be less likely considerations.  Pontine altered signal intensity may represent prominent perivascular spaces or result of prior small infarct.  No intracranial mass lesion noted on this unenhanced exam.  No hydrocephalus.  Exophthalmos.  Mild mucosal thickening paranasal sinuses most notable inferior right maxillary sinus with maximal thickness of 5 mm.  Mild  cervical spondylotic changes C3-4 mild spinal stenosis. Cervical medullary junction, pituitary region and pineal region unremarkable.  MRA HEAD FINDINGS  Aplastic A1 segment of the left anterior cerebral artery with A2 segment of the anterior cerebral artery supplied from the right bilaterally. Irregularity of the A2 segment of the anterior cerebral bilaterally with abrupt cut off of flow of the A2 segment of the right anterior cerebral artery 1.5 cm above its origin consistent with patient's acute right anterior cerebral artery distribution infarct.  There is a small bulge at the left anterior cerebral artery A1-A2 junction which may represent origin of a small vessel although difficult to completely exclude a a tiny aneurysm. The mild motion degradation somewhat limits evaluation on source images.  Mild irregularity  and minimal narrowing cavernous segment right internal carotid artery.  Fetal type origin of the left posterior cerebral artery.  Middle cerebral artery mild to moderate branch vessel irregularity with narrowing.  Left vertebral artery is poorly delineated and appears to end in a posterior inferior cerebellar artery distribution. Left posterior inferior cerebellar artery with moderate tandem stenoses.  Ectatic right vertebral artery without high-grade stenosis.  Moderate tandem stenosis right posterior inferior cerebellar artery.  Ectatic basilar artery with areas of mild to slight moderate narrowing most notable mid aspect.  Nonvisualized anterior inferior cerebellar arteries.  Mild to moderate irregularity superior cerebellar artery bilaterally.  Mild narrowing proximal right posterior cerebral artery. Fetal type origin of the left posterior cerebral artery. Moderate narrowing portions of P2/P3 and distal branches of the posterior cerebral artery bilaterally.  IMPRESSION: This exam was interpreted during a PACS downtime with limited availability of comparison cases.  MRI HEAD  Acute moderate size nonhemorrhagic right anterior cerebral artery distribution infarct involving the medial aspect of the right frontal and parietal lobe and right aspect of the corpus callosum. Local mass effect.  Tiny blood breakdown products cerebellum bilaterally may reflect result of prior hemorrhagic ischemia or prior trauma.  Moderate white matter type changes periventricular region advanced for patient's age and most likely related to result of small vessel disease in this hypertensive obese patient.  Pontine altered signal intensity may represent prominent perivascular spaces or result of prior small infarct.  Mild mucosal thickening paranasal sinuses most notable inferior right maxillary sinus with maximal thickness of 5 mm.  MRA HEAD  Aplastic A1 segment of  the left anterior cerebral artery with A2 segment of the anterior cerebral artery supplied from the right bilaterally. Irregularity of the A2 segment of the anterior cerebral bilaterally with abrupt cut off of flow of the A2 segment of the right anterior cerebral artery 1.5 cm above its origin consistent with patient's acute right anterior cerebral artery distribution infarct.  There is a small bulge at the left anterior cerebral artery A1-A2 junction which may represent origin of a small vessel although difficult to completely exclude a a tiny aneurysm. The mild motion degradation somewhat limits evaluation on source images.  Middle cerebral artery mild to moderate branch vessel irregularity with narrowing.  Posterior fossa atherosclerotic type changes as detailed above.   Electronically Signed  By: Lacy Duverney M.D.  On: 11/01/2014 09:36    Assessment & Plan:  49 year old male patient recently hospitalized for acute right anterior cerebral artery ischemic stroke with left hemiparesis and hypertension here to establish care.  Right anterior cerebral artery ischemic stroke with left hemiparesis: Continue rehabilitation as per protocol. Scheduled to see neurology and rehabilitation. Scheduled to see cardiology on 12/16/14 for an event monitor to determine  etiology of embolic stroke. I have informed him of revealed health resources available but he chooses not to see the social worker for now.  Hypertension: Blood pressure is mildly elevated I will make no changes at this time. Low-sodium diet and DASH diet advised.  Insomnia: Placed on trazodone.  Cardiomyopathy: EF of 35-40%. Advised to keep appointment with cardiology.  Disclaimer: This note was dictated with voice recognition software. Similar sounding words can inadvertently be transcribed and this note may contain transcription errors which may not have been corrected upon publication of note.

## 2014-12-07 NOTE — Patient Instructions (Signed)
Ischemic Stroke °A stroke (cerebrovascular accident) is the sudden death of brain tissue. It is a medical emergency. A stroke can cause permanent loss of brain function. This can cause problems with different parts of your body. A transient ischemic attack (TIA) is different because it does not cause permanent damage. A TIA is a short-lived problem of poor blood flow affecting a part of the brain. A TIA is also a serious problem because having a TIA greatly increases the chances of having a stroke. When symptoms first develop, you cannot know if the problem might be a stroke or a TIA. °CAUSES  °A stroke is caused by a decrease of oxygen supply to an area of your brain. It is usually the result of a small blood clot or collection of cholesterol or fat (plaque) that blocks blood flow in the brain. A stroke can also be caused by blocked or damaged carotid arteries.  °RISK FACTORS °· High blood pressure (hypertension). °· High cholesterol. °· Diabetes mellitus. °· Heart disease. °· The buildup of plaque in the blood vessels (peripheral artery disease or atherosclerosis). °· The buildup of plaque in the blood vessels providing blood and oxygen to the brain (carotid artery stenosis). °· An abnormal heart rhythm (atrial fibrillation). °· Obesity. °· Smoking. °· Taking oral contraceptives (especially in combination with smoking). °· Physical inactivity. °· A diet high in fats, salt (sodium), and calories. °· Alcohol use. °· Use of illegal drugs (especially cocaine and methamphetamine). °· Being African American. °· Being over the age of 55. °· Family history of stroke. °· Previous history of blood clots, stroke, TIA, or heart attack. °· Sickle cell disease. °SYMPTOMS  °These symptoms usually develop suddenly, or may be newly present upon awakening from sleep: °· Sudden weakness or numbness of the face, arm, or leg, especially on one side of the body. °· Sudden trouble walking or difficulty moving arms or legs. °· Sudden  confusion. °· Sudden personality changes. °· Trouble speaking (aphasia) or understanding. °· Difficulty swallowing. °· Sudden trouble seeing in one or both eyes. °· Double vision. °· Dizziness. °· Loss of balance or coordination. °· Sudden severe headache with no known cause. °· Trouble reading or writing. °DIAGNOSIS  °Your health care provider can often determine the presence or absence of a stroke based on your symptoms, history, and physical exam. Computed tomography (CT) of the brain is usually performed to confirm the stroke, determine causes, and determine stroke severity. Other tests may be done to find the cause of the stroke. These tests may include: °· Electrocardiography. °· Continuous heart monitoring. °· Echocardiography. °· Carotid ultrasonography. °· Magnetic resonance imaging (MRI). °· A scan of the brain circulation. °· Blood tests. °PREVENTION  °The risk of a stroke can be decreased by appropriately treating high blood pressure, high cholesterol, diabetes, heart disease, and obesity and by quitting smoking, limiting alcohol, and staying physically active. °TREATMENT  °Time is of the essence. It is important to seek treatment at the first sign of these symptoms because you may receive a medicine to dissolve the clot (thrombolytic) that cannot be given if too much time has passed since your symptoms began. Even if you do not know when your symptoms began, get treatment as soon as possible as there are other treatment options available including oxygen, intravenous (IV) fluids, and medicines to thin the blood (anticoagulants). Treatment of stroke depends on the duration, severity, and cause of your symptoms. Medicines and dietary changes may be used to address diabetes, high blood   pressure, and other risk factors. Physical, speech, and occupational therapists will assess you and work with you to improve any functions impaired by the stroke. Measures will be taken to prevent short-term and long-term  complications, including infection from breathing foreign material into the lungs (aspiration pneumonia), blood clots in the legs, bedsores, and falls. Rarely, surgery may be needed to remove large blood clots or to open up blocked arteries. °HOME CARE INSTRUCTIONS  °· Take medicines only as directed by your health care provider. Follow the directions carefully. Medicines may be used to control risk factors for a stroke. Be sure you understand all your medicine instructions. °· You may be told to take a medicine to thin the blood, such as aspirin or the anticoagulant warfarin. Warfarin needs to be taken exactly as instructed. °¨ Too much and too little warfarin are both dangerous. Too much warfarin increases the risk of bleeding. Too little warfarin continues to allow the risk for blood clots. While taking warfarin, you will need to have regular blood tests to measure your blood clotting time. These blood tests usually include both the PT and INR tests. The PT and INR results allow your health care provider to adjust your dose of warfarin. The dose can change for many reasons. It is critically important that you take warfarin exactly as prescribed, and that you have your PT and INR levels drawn exactly as directed. °¨ Many foods, especially foods high in vitamin K, can interfere with warfarin and affect the PT and INR results. Foods high in vitamin K include spinach, kale, broccoli, cabbage, collard and turnip greens, brussels sprouts, peas, cauliflower, seaweed, and parsley, as well as beef and pork liver, green tea, and soybean oil. You should eat a consistent amount of foods high in vitamin K. Avoid major changes in your diet, or notify your health care provider before changing your diet. Arrange a visit with a dietitian to answer your questions. °¨ Many medicines can interfere with warfarin and affect the PT and INR results. You must tell your health care provider about any and all medicines you take. This  includes all vitamins and supplements. Be especially cautious with aspirin and anti-inflammatory medicines. Do not take or discontinue any prescribed or over-the-counter medicine except on the advice of your health care provider or pharmacist. °¨ Warfarin can have side effects, such as excessive bruising or bleeding. You will need to hold pressure over cuts for longer than usual. Your health care provider or pharmacist will discuss other potential side effects. °¨ Avoid sports or activities that may cause injury or bleeding. °¨ Be mindful when shaving, flossing your teeth, or handling sharp objects. °¨ Alcohol can change the body's ability to handle warfarin. It is best to avoid alcoholic drinks or consume only very small amounts while taking warfarin. Notify your health care provider if you change your alcohol intake. °¨ Notify your dentist or other health care providers before procedures. °· If swallow studies have determined that your swallowing reflex is present, you should eat healthy foods. Including 5 or more servings of fruits and vegetables a day may reduce the risk of stroke. Foods may need to be a certain consistency (soft or pureed), or small bites may need to be taken in order to avoid aspirating or choking. Certain dietary changes may be advised to address high blood pressure, high cholesterol, diabetes, or obesity. °¨ Food choices that are low in sodium, saturated fat, trans fat, and cholesterol are recommended to manage high blood pressure. °¨   Food choies that are high in fiber, and low in saturated fat, trans fat, and cholesterol may control cholesterol levels. °¨ Controlling carbohydrates and sugar intake is recommended to manage diabetes. °¨ Reducing calorie intake and making food choices that are low in sodium, saturated fat, trans fat, and cholesterol are recommended to manage obesity. °· Maintain a healthy weight. °· Stay physically active. It is recommended that you get at least 30 minutes of  activity on all or most days. °· Do not use any tobacco products including cigarettes, chewing tobacco, or electronic cigarettes. °· Limit alcohol use even if you are not taking warfarin. Moderate alcohol use is considered to be: °¨ No more than 2 drinks each day for men. °¨ No more than 1 drink each day for nonpregnant women. °· Home safety. A safe home environment is important to reduce the risk of falls. Your health care provider may arrange for specialists to evaluate your home. Having grab bars in the bedroom and bathroom is often important. Your health care provider may arrange for equipment to be used at home, such as raised toilets and a seat for the shower. °· Physical, occupational, and speech therapy. Ongoing therapy may be needed to maximize your recovery after a stroke. If you have been advised to use a walker or a cane, use it at all times. Be sure to keep your therapy appointments. °· Follow all instructions for follow-up with your health care provider. This is very important. This includes any referrals, physical therapy, rehabilitation, and lab tests. Proper follow-up can prevent another stroke from occurring. °SEEK MEDICAL CARE IF: °· You have personality changes. °· You have difficulty swallowing. °· You are seeing double. °· You have dizziness. °· You have a fever. °· You have skin breakdown. °SEEK IMMEDIATE MEDICAL CARE IF:  °Any of these symptoms may represent a serious problem that is an emergency. Do not wait to see if the symptoms will go away. Get medical help right away. Call your local emergency services (911 in [place] Do not drive yourself to the hospital. °· You have sudden weakness or numbness of the face, arm, or leg, especially on one side of the body. °· You have sudden trouble walking or difficulty moving arms or legs. °· You have sudden confusion. °· You have trouble speaking (aphasia) or understanding. °· You have sudden trouble seeing in one or both eyes. °· You have a loss of  balance or coordination. °· You have a sudden, severe headache with no known cause. °· You have new chest pain or an irregular heartbeat. °· You have a partial or total loss of consciousness. °Document Released: 07/10/2005 Document Revised: 11/24/2013 Document Reviewed: 02/18/2012 °[employer]® Patient Information ©2015 [employer]. This information is not intended to replace advice given to you by your health care provider. Make sure you discuss any questions you have with your health care provider. ° °

## 2014-12-07 NOTE — Progress Notes (Signed)
Patient in hospital for stroke, discharged last Thursday. Patient currently in wheelchair due to stroke. Patient denies pain currently. Patient reports having trouble falling asleep.

## 2014-12-08 ENCOUNTER — Encounter: Payer: Self-pay | Admitting: Occupational Therapy

## 2014-12-08 ENCOUNTER — Ambulatory Visit: Payer: MEDICAID | Admitting: Speech Pathology

## 2014-12-08 DIAGNOSIS — I429 Cardiomyopathy, unspecified: Secondary | ICD-10-CM | POA: Insufficient documentation

## 2014-12-15 DIAGNOSIS — I119 Hypertensive heart disease without heart failure: Secondary | ICD-10-CM | POA: Insufficient documentation

## 2014-12-15 NOTE — Progress Notes (Signed)
Cardiology Office Note   Date:  12/16/2014   ID:  [name] Short, DOB [birth year], MRN [id number]  PCP:  No primary care provider on file.  Cardiologist:  Seen by Dr. Hillis Short in the hospital >> will need new cardiologist.    Chief Complaint  Patient presents with  . Cardiomyopathy     History of Present Illness: [name] Short is a 49 y.o. male with a hx of untreated HTN, marijuana use who was admitted last month with an acute R brain CVA.  He was treated with tPA.  He continued to have L sided weakness.  He had an echo that demonstrated EF 35-40%.  He was seen by Dr. Hillis Short for consideration of TEE and Linq ILR. He was not felt to be a good candidate for ILR due to poorly controlled HTN, non-adherence and need for additional OP cardiology evaluation.  TEE did not demonstrate embolic source.  OP event monitor was to be arranged.  The patient was not established with a general cardiologist and that will be decided by me today. Renal artery [place] was neg for RAS.  DCM was felt to be related to HTN heart disease.  However, consideration would be given for outpatient ischemic evaluation.    He returns for FU.  He is here today with his wife.  He is doing fairly well.  His speech remains impaired.  His L leg is still weak.  He is working with HHPT/HHOT.  He feels like he is doing better.  He denies chest pain, significant dyspnea, orthopnea, PND, edema.  No syncope.   Studies/Reports Reviewed Today:  Renal Art [place] 11/04/14 - No evidence of renal artery stenosis noted bilaterally. - Bilateral normal intrarenal resistive indices.  TEE 11/03/14 Findings: Please see echo section for full report. Normal left ventricular size with moderate LV hypertrophy. Diffuse hypokinesis with EF 35%. Normal RV size with mildly decreased systolic function. Trivial mitral regurgitation. Trivial tricuspid regurgitation. Trileaflet aortic valve with no stenosis and mild regurgitation. Mildly dilated left  atrium, no LAA thrombus but there was smoke in the appendage. Normal caliber aorta with mild plaque. Negative bubble study, no ASD or PFO.  No definite source of embolus. EF 35% with moderate LVH and smoke but no thrombus in LA appendage  Echo 10/31/14 - Left ventricle: The cavity size was mildly dilated. Wall thickness was increased in a pattern of severe LVH. Systolic function was moderately reduced. EF 35% to 40%. Diffuse hypokinesis. Doppler parameters are consistent with elevated ventricular end-diastolic filling pressure. - Left atrium: The atrium was moderately dilated. - Atrial septum: No defect or patent foramen ovale was identified. - Pulmonary arteries: PA peak pressure: 45 mm Hg (S). - Impressions: In the absence of HTN or renal failure cannot r/o amyloid. Impressions: In the absence of HTN or renal failure cannot r/o amyloid.  Carotid US 11/01/14 Bilateral: 1-39% ICA stenosis. Vertebral artery flow is antegrade.   Past Medical History  Diagnosis Date  . Hypertension     a. Previously on meds but none in several years.  . Smoker     a. Previous tobacco - quit ~ 2013. Ongoing daily marijuana usage. Occasional cigar.  . Stroke     a. 10/2014 MRI Head: acute mod size nonhemorrhagic R ant cerebral artery distribution infarct involving the medial aspect of the right frontal and parietal lobe and right aspect of the corpus callosum w/ local mass effect-->TPA;  b. 10/2014 MRA: Abrupt cut off of the R ACA  A2 segment;  b. 10/2014 Carotid U/S: 1-39% bilat ICA stenosis.  . Cardiomyopathy     a. 10/2014 Echo: EF 35-40%, diff HK, sev LVH, mod dil LA, PASP .    Past Surgical History  Procedure Laterality Date  . Tee without cardioversion N/A 11/03/2014    Procedure: TRANSESOPHAGEAL ECHOCARDIOGRAM (TEE);  Surgeon: [name] Morale, MD;  Location: [facility] ENDOSCOPY;  Service: Cardiovascular;  Laterality: N/A;     Current Outpatient Prescriptions  Medication Sig Dispense  Refill  . amLODipine (NORVASC) 10 MG tablet Take 1 tablet (10 mg total) by mouth daily. 30 tablet 1  . atorvastatin (LIPITOR) 40 MG tablet Take 1 tablet (40 mg total) by mouth daily at 6 PM. 30 tablet 2  . carvedilol (COREG) 12.5 MG tablet Take 1 tablet (12.5 mg total) by mouth 2 (two) times daily with a meal. 60 tablet 2  . diclofenac sodium (VOLTAREN) 1 % GEL Apply 2 g topically 4 (four) times daily. 1 Tube 1  . lisinopril (PRINIVIL,ZESTRIL) 20 MG tablet Take 1 tablet (20 mg total) by mouth daily. 30 tablet 2  . methylphenidate (RITALIN) 10 MG tablet Take 1 tablet (10 mg total) by mouth 2 (two) times daily with breakfast and lunch. 90 tablet 0  . traZODone (DESYREL) 50 MG tablet Take 1 tablet (50 mg total) by mouth at bedtime as needed for sleep. 30 tablet 2  . apixaban (ELIQUIS) 5 MG TABS tablet Take 1 tablet (5 mg total) by mouth 2 (two) times daily.     No current facility-administered medications for this visit.    Allergies:   Review of patient's allergies indicates no known allergies.    Social History:  The patient  reports that he quit smoking about 6 weeks ago. His smoking use included Cigars. He does not have any smokeless tobacco history on file. He reports that he drinks alcohol. He reports that he uses illicit drugs.   Family History:  The patient's family history includes Diabetes in his mother; Heart attack in his father; Heart disease in his father; Hypertension in his brother, father, and sister; Other in an other family member; Stroke in his sister.    ROS:   Please see the history of present illness.   Review of Systems  All other systems reviewed and are negative.    PHYSICAL EXAM: VS:  BP 120/80 mmHg  Pulse 75  Ht  (1.778 m)  Wt 183 lb (83.008 kg)  BMI 26.26 kg/m2    Wt Readings from Last 3 Encounters:  12/16/14 183 lb (83.008 kg)  12/02/14 203 lb 0.7 oz (92.1 kg)  11/01/14 205 lb 0.4 oz (93 kg)     GEN: Well nourished, well developed, in no acute  distressin a wheelchair  HEENT: normal Neck: no JVD,   no masses Cardiac:  Normal S1/S2, irregular irregular rhythm; no murmur , no rubs or gallops, no edema   Respiratory:  clear to auscultation bilaterally, no wheezing, rhonchi or rales. GI: soft, nontender, nondistended, + BS MS: no deformity or atrophy Skin: warm and dry  Neuro:  CNs II-XII intact, L leg weak Psych: Normal affect   EKG:  EKG is ordered today.  It demonstrates:   AFib, HR 75, LAD, NSSTTW changes   Recent Labs: 11/06/2014: ALT 19; Hemoglobin 13.6; Platelets 169 12/01/2014: BUN 12; Creatinine 1.15; Potassium 3.6; Sodium 138    Lipid Panel    Component Value Date/Time   CHOL 164 11/01/2014 0601   TRIG 72 11/01/2014  0601   HDL 28* 11/01/2014 0601   CHOLHDL 5.9 11/01/2014 0601   VLDL 14 11/01/2014 0601   LDLCALC 122* 11/01/2014 0601      ASSESSMENT AND PLAN:  Atrial Fibrillation:  This patients CHA2DS2-VASc Score and unadjusted Ischemic Stroke Rate (% per year) is equal to 4.8 % stroke rate/year from a score of 4.  Above score calculated as 1 point each if present [CHF, HTN, DM, Vascular=MI/PAD/Aortic Plaque, Age if 65-74, or Male].  Above score calculated as 2 points each if present [Age > 75, or Stroke/TIA/TE].  He needs to start on oral anticoagulation for stroke prophylaxis.  I had a long discussion today with him and his wife regarding the importance of anticoagulation therapy. He understands all of this. I answered all of their questions.  At this point, he is fairly asymptomatic. I am not certain that we need to pursue rhythm control at this point in time. His rate is controlled.  -  Stop Plavix and aspirin  -  Start Eliquis 5 mg twice a day (age less than 80, creatinine less than 1.5, weight greater than 60 kg)  -  Refer to CVRR clinic to discuss anticoagulation therapy  -  Obtain BMET, CBC at follow-up in one month.  Dilated cardiomyopathy:  Probably related to untreated HTN.  No source of embolus on  TEE.  Continue beta-blocker, ACE inhibitor.  Given risk factors will need to arrange ischemic evaluation.  As his stroke was approximately 1 month ago, and he is not having any symptoms to suggest ischemia, I will hold off on proceeding with stress testing for now. Consider Lexiscan Myoview at follow-up.  Hypertensive heart disease without heart failure:  Blood pressure better controlled. Continue current therapy.  Hyperlipidemia LDL goal <70:  Continue statin.    History of stroke:  As he is in atrial fibrillation today, he does not need an event monitor. Start anticoagulation as discussed above. Follow-up with neurology and rehabilitation as planned.    Cigarette smoker:  He has quit smoking.   Current medicines are reviewed at length with the patient today.  Concerns regarding medicines are as outlined above.  The following changes have been made:    Stop aspirin  Stop Plavix  Start Eliquis 5 mg twice a day   Labs/ tests ordered today include:  Orders Placed This Encounter  Procedures  . Basic Metabolic Panel (BMET)  . CBC w/Diff  . EKG 12-Lead    Disposition:   FU with me in one month. I will plan on having him follow-up with Dr. Anne FuSkains as his primary cardiologist after that visit.   Signed, Brynda RimScott Britiny Defrain, PA-C, MHS 12/16/2014 5:15 PM    [facility] [address], [place], [place]  [address] Phone: [phone]; Fax: [phone]

## 2014-12-16 ENCOUNTER — Ambulatory Visit (INDEPENDENT_AMBULATORY_CARE_PROVIDER_SITE_OTHER): Payer: Medicaid Other | Admitting: Physician Assistant

## 2014-12-16 ENCOUNTER — Encounter: Payer: Self-pay | Admitting: Physician Assistant

## 2014-12-16 VITALS — BP 120/80 | HR 75 | Ht 70.0 in | Wt 183.0 lb

## 2014-12-16 DIAGNOSIS — I119 Hypertensive heart disease without heart failure: Secondary | ICD-10-CM | POA: Diagnosis not present

## 2014-12-16 DIAGNOSIS — Z8673 Personal history of transient ischemic attack (TIA), and cerebral infarction without residual deficits: Secondary | ICD-10-CM | POA: Diagnosis not present

## 2014-12-16 DIAGNOSIS — E785 Hyperlipidemia, unspecified: Secondary | ICD-10-CM

## 2014-12-16 DIAGNOSIS — Z720 Tobacco use: Secondary | ICD-10-CM

## 2014-12-16 DIAGNOSIS — F17210 Nicotine dependence, cigarettes, uncomplicated: Secondary | ICD-10-CM

## 2014-12-16 DIAGNOSIS — I420 Dilated cardiomyopathy: Secondary | ICD-10-CM | POA: Diagnosis not present

## 2014-12-16 DIAGNOSIS — I4891 Unspecified atrial fibrillation: Secondary | ICD-10-CM

## 2014-12-16 MED ORDER — APIXABAN 5 MG PO TABS: 5.0000 mg | ORAL_TABLET | Freq: Two times a day (BID) | ORAL | Status: DC

## 2014-12-16 NOTE — Patient Instructions (Addendum)
Medication Instructions:  1. STOP ASPIRIN 2. STOP PLAVIX  3. START ELIQUIS 5 MG 1 TABLET TWICE DAILY  Labwork: LAB WORK ON 01/13/15 WHEN YOU SEE SCOTT WEAVER, PAC  Testing/Procedures: NONE  Follow-Up: 1. [facility] @ 3:00  2. YOU HAVE A NEW PT APPT WITH OUR ANTICOAGULATION CLINIC DUE TO NEW START ELIQUIS  Any Other Special Instructions Will Be Listed Below (If Applicable).

## 2014-12-22 DIAGNOSIS — I10 Essential (primary) hypertension: Secondary | ICD-10-CM | POA: Diagnosis not present

## 2014-12-22 DIAGNOSIS — I69354 Hemiplegia and hemiparesis following cerebral infarction affecting left non-dominant side: Secondary | ICD-10-CM | POA: Diagnosis not present

## 2014-12-22 DIAGNOSIS — Z7901 Long term (current) use of anticoagulants: Secondary | ICD-10-CM

## 2014-12-22 DIAGNOSIS — Z7982 Long term (current) use of aspirin: Secondary | ICD-10-CM

## 2014-12-22 DIAGNOSIS — Z87891 Personal history of nicotine dependence: Secondary | ICD-10-CM | POA: Diagnosis not present

## 2014-12-22 DIAGNOSIS — I6931 Cognitive deficits following cerebral infarction: Secondary | ICD-10-CM | POA: Diagnosis not present

## 2014-12-24 ENCOUNTER — Encounter: Payer: Self-pay | Admitting: Family Medicine

## 2014-12-24 ENCOUNTER — Ambulatory Visit: Payer: Medicaid Other | Attending: Family Medicine | Admitting: Family Medicine

## 2014-12-24 VITALS — BP 138/90 | HR 62 | Temp 98.8°F | Resp 18 | Ht 70.0 in | Wt 172.0 lb

## 2014-12-24 DIAGNOSIS — I69354 Hemiplegia and hemiparesis following cerebral infarction affecting left non-dominant side: Secondary | ICD-10-CM | POA: Diagnosis not present

## 2014-12-24 DIAGNOSIS — I4891 Unspecified atrial fibrillation: Secondary | ICD-10-CM | POA: Diagnosis not present

## 2014-12-24 DIAGNOSIS — I63521 Cerebral infarction due to unspecified occlusion or stenosis of right anterior cerebral artery: Secondary | ICD-10-CM

## 2014-12-24 DIAGNOSIS — I6931 Cognitive deficits following cerebral infarction: Secondary | ICD-10-CM

## 2014-12-24 DIAGNOSIS — I480 Paroxysmal atrial fibrillation: Secondary | ICD-10-CM | POA: Diagnosis not present

## 2014-12-24 DIAGNOSIS — Z87891 Personal history of nicotine dependence: Secondary | ICD-10-CM | POA: Diagnosis not present

## 2014-12-24 DIAGNOSIS — I69319 Unspecified symptoms and signs involving cognitive functions following cerebral infarction: Secondary | ICD-10-CM

## 2014-12-24 NOTE — Progress Notes (Signed)
   Subjective:    Patient ID: [name] Short, male    DOB: 1965-12-22, 49 y.o.   MRN: [id number] CC: meet PCP, CVA, A fib  HPI 49 yo M presents for f/u:  1. Recent CVA: with L sided hemiparesis. Improving. Eager to start out patient rehab. Taking ritalin to help focus as stroke has affected his attention. Recently diagnosed with A fib.  2. A fib: on eliquis and ASA. No bleeding or bruising. No recent falls or head trauma. No CP or SOB.   Soc Hx: former smoker, quit in 10/31/2014   Review of Systems  Constitutional: Negative for fever and chills.  Respiratory: Negative for shortness of breath.   Cardiovascular: Negative for chest pain.  Skin: Negative for wound.  Neurological: Negative for weakness.      Objective:   Physical Exam BP 138/90 mmHg  Pulse 62  Temp(Src) 98.8 F (37.1 C) (Oral)  Resp 18  Ht 5\' 10"  (1.778 m)  Wt 172 lb (78.019 kg)  BMI 24.68 kg/m2  SpO2 100% General appearance: alert, cooperative and no distress, in wheelchair  Lungs: clear to auscultation bilaterally Heart: regular rate and rhythm, S1, S2 normal, no murmur, click, rub or gallop Neuro: L sided weakness      Assessment & Plan:

## 2014-12-24 NOTE — Patient Instructions (Addendum)
Mr. [name] Short,  Thank you for coming in today.  Continue current treatment. Continue ritalin right now which is to improve focus since stroke. We will plan to taper it to off starting at the next visit.   Keep specialist f/u appts.  F/u in 8 weeks for stroke and A fib  Dr. Armen PickupFunches

## 2014-12-24 NOTE — Progress Notes (Signed)
Establish Care F/U CVA

## 2014-12-25 ENCOUNTER — Encounter: Payer: Self-pay | Admitting: Physical Medicine & Rehabilitation

## 2014-12-25 ENCOUNTER — Encounter: Payer: Medicaid Other | Attending: Physical Medicine & Rehabilitation

## 2014-12-25 ENCOUNTER — Ambulatory Visit (HOSPITAL_BASED_OUTPATIENT_CLINIC_OR_DEPARTMENT_OTHER): Payer: Medicaid Other | Admitting: Physical Medicine & Rehabilitation

## 2014-12-25 VITALS — BP 149/83 | HR 68 | Resp 14

## 2014-12-25 DIAGNOSIS — G8190 Hemiplegia, unspecified affecting unspecified side: Secondary | ICD-10-CM | POA: Insufficient documentation

## 2014-12-25 DIAGNOSIS — I4891 Unspecified atrial fibrillation: Secondary | ICD-10-CM | POA: Insufficient documentation

## 2014-12-25 DIAGNOSIS — I6931 Cognitive deficits following cerebral infarction: Secondary | ICD-10-CM | POA: Insufficient documentation

## 2014-12-25 DIAGNOSIS — I63521 Cerebral infarction due to unspecified occlusion or stenosis of right anterior cerebral artery: Secondary | ICD-10-CM

## 2014-12-25 DIAGNOSIS — G8194 Hemiplegia, unspecified affecting left nondominant side: Secondary | ICD-10-CM

## 2014-12-25 DIAGNOSIS — I69319 Unspecified symptoms and signs involving cognitive functions following cerebral infarction: Secondary | ICD-10-CM

## 2014-12-25 MED ORDER — METHYLPHENIDATE HCL 10 MG PO TABS: 10.0000 mg | ORAL_TABLET | Freq: Two times a day (BID) | ORAL | Status: DC

## 2014-12-25 NOTE — Assessment & Plan Note (Signed)
A: stroke with L sided weakness, improving P: Patient to f/u with neuro rehab  Will get PT

## 2014-12-25 NOTE — Assessment & Plan Note (Signed)
A: improved on ritalin P: continue ritalin for now with plan to taper as tolerated

## 2014-12-25 NOTE — Progress Notes (Signed)
Subjective:    Patient ID: [name] Short, male    DOB: [birth year], 49 y.o.   MRN: [id number] 49 y.o.right handed  male with history of hypertension as well as remote tobacco abuse. Patient on no scheduled medications upon admission. Patient independent prior to admission living with his girlfriend working at [employer]. Presented 10/31/2014 with left-sided weakness and altered mental status. Noted blood pressure 220/110. Urine drug screen positive for marijuana. MRI of the brain showed acute ischemic right ACA territory infarct. 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 Irregularity of the A2 segment of the anterior cerebral bilaterally with abrupt cut off of flow at the A2 segment of the right ACA 1.5 cm above its origin consistent with patient's acute right ACA distribution infarct. Carotid Dopplers were no ICA stenosis. Echocardiogram with ejection fraction 35-40% with diffuse hypokinesis. Venous Doppler studies lower extremities negative for DVT. Neurology consulted patient did  receive TPA. Presently maintained on aspirin/Plavix   HPI On Ritalin from hospital Has had several falls since discharge, no reported injury Pt has followed up with PCP and cardiology, changed from ASA and Plavix to Eliquis Completing HHPT, OT, saw SLP only once   Patient still requiring 24 7 supervision and this is been provided to his girlfriend as well as his daughter Need supervision with transfers from wheelchair to couch Needs assistance mainly with lower body dressing and bathing  Still taking Ritalin Pain Inventory Average Pain 0 Pain Right Now 0 My pain is no pain  In the last 24 hours, has pain interfered with the following? General activity 0 Relation with others 10 Enjoyment of life 6 What TIME of day is your pain at its worst? no pain Sleep (in general) Fair  Pain is worse  with: no pain Pain improves with: no pain Relief from Meds: 0  Mobility ability to climb steps?  no do you drive?  no use a wheelchair needs help with transfers  Function disabled: date disabled . I need assistance with the following:  feeding, bathing and toileting  Neuro/Psych No problems in this area  Prior Studies Any changes since last visit?  no  Physicians involved in your care Any changes since last visit?  no   Family History  Problem Relation Age of Onset  . Other      no premature cad  . Diabetes Mother   . Heart disease Father   . Heart attack Father   . Stroke Sister   . Hypertension Father   . Hypertension Brother   . Hypertension Sister    History   Social History  . Marital Status: Single    Spouse Name: N/A  . Number of Children: N/A  . Years of Education: N/A   Social History Main Topics  . Smoking status: Former Smoker    Types: Cigars    Quit date: 10/31/2014  . Smokeless tobacco: Not on file     Comment: previously smoked cigarettes - quit ~ 2013.  Marland Kitchen Alcohol Use: 0.0 oz/week    0 Standard drinks or equivalent per week     Comment: occasional  . Drug Use: Yes     Comment: Marijuana daily. Has not had marijuana since being in hospital.  . Sexual Activity: Not on file   Other Topics Concern  . None   Social History Narrative   Lives in [place] with his  GF.  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   Past Surgical History  Procedure Laterality Date  . Tee without cardioversion N/A 11/03/2014    Procedure: TRANSESOPHAGEAL ECHOCARDIOGRAM (TEE);  Surgeon: Laurey Morale, MD;  [facility] ENDOSCOPY;  Service: Cardiovascular;  Laterality: N/A;   Past Medical History  Diagnosis Date  . Hypertension     a. Previously on meds but none in several years.  . Smoker     a. Previous tobacco - quit ~ 2013. Ongoing daily marijuana usage. Occasional cigar.  . Stroke     a. 10/2014 MRI Head: acute mod size nonhemorrhagic R ant cerebral artery  distribution infarct involving the medial aspect of the right frontal and parietal lobe and right aspect of the corpus callosum w/ local mass effect-->TPA;  b. 10/2014 MRA: Abrupt cut off of the R ACA A2 segment;  b. 10/2014 Carotid U/S: 1-39% bilat ICA stenosis.  . Cardiomyopathy     a. 10/2014 Echo: EF 35-40%, diff HK, sev LVH, mod dil LA, PASP .   BP 149/83 mmHg  Pulse 68  Resp 14  SpO2 100%  Opioid Risk Score:   Fall Risk Score: High Fall Risk (>13 points) (educated and given handout on fall prevention in the home)`1  Depression screen PHQ 2/9  Depression screen [facility] 2/9 12/25/2014 12/24/2014 12/07/2014  Decreased Interest 0 0 3  Down, Depressed, Hopeless 0 0 0  PHQ - 2 Score 0 0 3  Altered sleeping 0 - 3  Tired, decreased energy 0 - 3  Change in appetite 0 - 0  Feeling bad or failure about yourself  0 - 3  Trouble concentrating 0 - 3  Moving slowly or fidgety/restless 0 - 0  Suicidal thoughts 0 - 0  PHQ-9 Score 0 - 15     Review of Systems  HENT: Negative.   Eyes: Negative.   Cardiovascular: Negative.   Gastrointestinal: Negative.   Endocrine: Negative.   Genitourinary: Negative.   Musculoskeletal: Negative.   Skin: Negative.   Allergic/Immunologic: Negative.   Neurological: Positive for weakness.  Hematological: Bruises/bleeds easily.  Psychiatric/Behavioral: Negative.   All other systems reviewed and are negative.      Objective:   Physical Exam  Constitutional: He is oriented to person, place, and time. He appears well-developed and well-nourished.  HENT:  Head: Normocephalic and atraumatic.  Neurological: He is alert and oriented to person, place, and time.  Psychiatric: He has a normal mood and affect.  Nursing note and vitals reviewed.   Motor strength is 5/5 on the right side 4/5 in the left deltoid, bicep, triceps, grip He has a mild persistent grasp in the left hand but much improved compared to while he was in the hospital Left lower 3 minus knee  extension as well as hip flexion 0 at the ankle plantar flexor and dorsiflexor Reduced sensation in the left lower extremity, equal sensation in the left upper extremity compared to the right upper extremity.      Assessment & Plan:  1. Right anterior cerebral  artery infarct embolic now on Eliquis. This has resulted in left-sided weakness mainly in the left lower extremity. He also has left lower extremity sensory deficits Overall making good improvement particularly in the left upper extremity. I feel he would benefit from outpatient PT OT and speech therapy as well as an ankle-foot orthosis on the left side to help with mobility. We'll make referral to neuro-Rehabilitation Orders will be written today.  2. Cognitive  deficits as result of stroke this is mainly attention and concentration initiation, we will continue Ritalin for now. Reevaluate in 1 month  Over half of the 25 min visit was spent counseling and coordinating care.

## 2015-01-05 ENCOUNTER — Encounter: Payer: Self-pay | Admitting: Physical Therapy

## 2015-01-05 ENCOUNTER — Encounter: Payer: Medicaid Other | Admitting: Occupational Therapy

## 2015-01-05 ENCOUNTER — Ambulatory Visit: Payer: Medicaid Other | Attending: Physical Medicine & Rehabilitation | Admitting: Physical Therapy

## 2015-01-05 DIAGNOSIS — R269 Unspecified abnormalities of gait and mobility: Secondary | ICD-10-CM

## 2015-01-05 DIAGNOSIS — R279 Unspecified lack of coordination: Secondary | ICD-10-CM | POA: Diagnosis not present

## 2015-01-05 DIAGNOSIS — R531 Weakness: Secondary | ICD-10-CM | POA: Diagnosis not present

## 2015-01-05 DIAGNOSIS — R2689 Other abnormalities of gait and mobility: Secondary | ICD-10-CM

## 2015-01-05 DIAGNOSIS — I69898 Other sequelae of other cerebrovascular disease: Secondary | ICD-10-CM | POA: Insufficient documentation

## 2015-01-05 DIAGNOSIS — IMO0002 Reserved for concepts with insufficient information to code with codable children: Secondary | ICD-10-CM

## 2015-01-05 DIAGNOSIS — R278 Other lack of coordination: Secondary | ICD-10-CM

## 2015-01-05 DIAGNOSIS — I69398 Other sequelae of cerebral infarction: Secondary | ICD-10-CM

## 2015-01-05 NOTE — Therapy (Addendum)
[facility] [address] [place], [place], [address] Phone: [phone]   Fax:  [phone]  Physical Therapy Evaluation  Patient Details  Name: [name] Short MRN: [id number] Date of Birth: 1966/06/01 Referring Provider:  Erick Colace, MD  Encounter Date: 01/05/2015      PT End of Session - 01/05/15 1113    Visit Number 1   Number of Visits 12   Date for PT Re-Evaluation 03/06/15   Authorization Type Medicaid   PT Start Time 0804   PT Stop Time 0845   PT Time Calculation (min) 41 min   Equipment Utilized During Treatment Gait belt   Activity Tolerance Patient tolerated treatment well   Behavior During Therapy [facility] for tasks assessed/performed      Past Medical History  Diagnosis Date  . Hypertension     a. Previously on meds but none in several years.  . Smoker     a. Previous tobacco - quit ~ 2013. Ongoing daily marijuana usage. Occasional cigar.  . Stroke     a. 10/2014 MRI Head: acute mod size nonhemorrhagic R ant cerebral artery distribution infarct involving the medial aspect of the right frontal and parietal lobe and right aspect of the corpus callosum w/ local mass effect-->TPA;  b. 10/2014 MRA: Abrupt cut off of the R ACA A2 segment;  b. 10/2014 Carotid U/S: 1-39% bilat ICA stenosis.  . Cardiomyopathy     a. 10/2014 Echo: EF 35-40%, diff HK, sev LVH, mod dil LA, PASP .    Past Surgical History  Procedure Laterality Date  . Tee without cardioversion N/A 11/03/2014    Procedure: TRANSESOPHAGEAL ECHOCARDIOGRAM (TEE);  Surgeon: Laurey Morale, MD;  Location: [facility] ENDOSCOPY;  Service: Cardiovascular;  Laterality: N/A;    There were no vitals filed for this visit.  Visit Diagnosis:  Weakness due to cerebrovascular accident  Poor balance  Abnormality of gait following cerebrovascular accident  Decreased coordination      Subjective Assessment - 01/05/15 0809    Subjective pt is a 49 y.o.  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  pt currently has 24 hour supervision at home, and caregiver reports he can perform lateral transfers independently, but otherwise requires assistance with most functional activities. pt is currently able to stand at home at kitchen counter with help of daughter and fiance. pt is currently dependent on wheelchair for mobility needs.    Patient is accompained by: Family member  Girlfriend, Retail banker   Pertinent History HTN, A-Fib, cognitive impairments   Patient Stated Goals pt wishes to be able to walk again.    Currently in Pain? No/denies            [facility] PT Assessment - 01/05/15 0813    Assessment   Medical Diagnosis right anterior cerebral artery (ACA) stroke   Onset Date/Surgical Date 10/31/14   Hand Dominance Right   Next MD Visit next month    Prior Therapy inpatient rehab [facility]    Precautions   Precautions Fall   Required Braces or Orthoses --  pt currently has order for AFO    Restrictions   Weight Bearing Restrictions No   Balance Screen   Has the patient fallen in the past 6 months Yes   How many times? 3   Has the patient had a decrease in activity level  because of a fear of falling?  No   Is the patient reluctant to leave their home because of a fear of falling?  No   Home Environment   Living Environment Private residence   Living Arrangements Spouse/significant other   Available Help at Discharge Family  daughter and fiance   Type of Home House   Home Access Level entry   Home Layout One level   Home Equipment Shower seat;Toilet riser;Walker - standard   Prior Function   Level of Independence Independent   Vocation Full time employment   Vocation Requirements cook at [employer]   Overall Cognitive Status Impaired/Different from baseline    Area of Impairment Attention;Awareness   Attention Comments Medical records report: "Cognitive deficits as result of stroke this is mainly attention and concentration initiation   Attention --  difficulty concentrating in busy environment    Sensation   Additional Comments pt reports decreased sensation in LLE   Tone   Assessment Location Left Lower Extremity   ROM / Strength   AROM / PROM / Strength Strength   Strength   Overall Strength Deficits   Overall Strength Comments R LE grossly 4/5; LLE grossly 2+/5 and ankle dorsiflexion 2-5    Flexibility   Soft Tissue Assessment /Muscle Length yes   Hamstrings limited hamstring and heelcord flexibility   Bed Mobility   Bed Mobility Rolling Right;Rolling Left;Left Sidelying to Sit;Sit to Supine   Rolling Right 5: Supervision   Rolling Left 5: Supervision   Left Sidelying to Sit 4: Min assist   Sit to Supine 4: Min assist   Transfers   Transfers Sit to Stand;Lateral/Scoot Transfers   Sit to Stand 4: Min guard   Lateral/Scoot Transfers 4: Min guard   Balance   Balance Assessed Yes   Static Standing Balance   Static Standing - Balance Support No upper extremity supported;During functional activity  During STS   Static Standing - Level of Assistance 3: Mod assist;4: Min assist   Static Standing Balance -  Activities  Single Leg Stance - Right Leg;Single Leg Stance - Left Leg   Static Standing - Comment/# of Minutes pt unable to maintain balance in SLS, requires Mod assist to prevent fall. pt only able to maintain static stance briefly.    Standardized Balance Assessment   Standardized Balance Assessment PASS  PASS score is 19.    LLE Tone   LLE Tone Hypertonic;Mild                           PT Education - 01/05/15 1943    Education provided Yes   Education Details POC   Person(s) Educated Patient;Spouse   Methods Explanation   Comprehension Verbalized understanding          PT Short Term Goals - 01/05/15  1130    PT SHORT TERM GOAL #1   Title The patient will perform HEP with supervision from family for safety. Target Date July 12th.   Baseline Pt dependent for HEP.   Time 4   Period Weeks   Status New   PT SHORT TERM GOAL #2   Title The patient will move sit<>stand with SBA to demo improved functional independence. Target Date July 12th.   Baseline Pt requires Min A for sit<>stand.   Time 4   Period Weeks   Status New   PT SHORT TERM GOAL #3   Title The patient will ambulate  with LRAD and mod A x 50 feet on indoor level surface. Target Date July 12th.    Baseline Pt requires total A for gait < 5'.   Time 4   Period Weeks   Status New   PT SHORT TERM GOAL #4   Title Pt will improve PASS score from 19/36 to 21/36 to demo improved functional mobility. Target Date July 12th.    Baseline PASS score: 19/36.   Time 4   Period Weeks   Status New           PT Long Term Goals - 01/05/15 1132    PT LONG TERM GOAL #1   Title Pt will verbalize understanding of stroke risk factors and warning signs. Target date: 03/02/15.   Baseline Pt requires total A to recall stroke risk factors and warning signs.   Time 8   Period Weeks   Status New   PT LONG TERM GOAL #2   Title pt will improve PASS score from 19/36 to 23/36 to demo improved functional mobility. Target date: 03/02/15.   Baseline PASS score: 19/36.   Time 8   Period Weeks   Status New   PT LONG TERM GOAL #3   Title The patient will ambulate over level surfaces with LRAD and CGA x 75 feet for improved household ambulation.Target date: 03/02/15.   Baseline Pt requires total A for ambulation < 5'.   Time 8   Period Weeks   Status New   PT LONG TERM GOAL #4   Title --   Time --   Period --   Status --               Plan - 01/05/15 1114    Clinical Impression Statement pt is a 49 y.o. male who presents to outpatient neuro PT clinic today with functional limitations, L hemiparesis due to acute ischemic right anterior  cerebral artery (ACA) CVA sustained 10/31/14.  Pt hospitalized from 10/31/14 -12/03/14 (including inpatient rehab). Pt presenting with L sided weakness, impaired balance, and difficulty with bed mobility and performing functional mobility tasks. 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 pt has fallen 3 times in the last 6 months when performing bed mobility movements. Pt would benefit from skilled PT for 12 total sessions over the course of 8 weeks (prior to 03/02/15) to address current deficits and improve functional independence with mobility and decrease risk of falling..   Pt will benefit from skilled therapeutic intervention in order to improve on the following deficits Abnormal gait;Decreased coordination;Decreased range of motion;Difficulty walking;Impaired tone;Decreased safety awareness;Impaired UE functional use;Decreased balance;Decreased knowledge of use of DME;Impaired flexibility;Decreased strength;Decreased mobility;Decreased cognition;Impaired sensation;Postural dysfunction   Rehab Potential Fair   Clinical Impairments Affecting Rehab Potential Cognitive deficits as result of stroke (mainly attention, concentration, and initiation); L sided inattention   PT Frequency 2x / week   PT Duration 6 weeks  Total of 12 sessions over the course of 8 weeks (prior to 03/02/15)   PT Treatment/Interventions Passive range of motion;Patient/family education;Orthotic Fit/Training;Wheelchair mobility training;Manual techniques;Neuromuscular re-education;Balance training;Therapeutic exercise;Therapeutic activities;Functional mobility training;DME Instruction;Gait training;Stair training;Electrical Stimulation;Biofeedback;ADLs/Self Care Home Management;Cognitive remediation   PT Next Visit Plan Assess gait with RW; establish HEP for L LE  and core strengthening; static balance w/ emphasis on weightshifting    PT Home Exercise Plan Focus on  NMR, strengthening for functional return in LLE.   Recommended Other Services None at this time   Consulted and Agree with Plan of Care Patient;Family member/caregiver   Family Member Consulted Girlfriend, Retail banker         Problem List Patient Active Problem List   Diagnosis Date Noted  . Atrial fibrillation 12/25/2014  . Hypertensive heart disease 12/15/2014  . Cardiomyopathy 12/08/2014  . Insomnia 12/07/2014  . Essential hypertension 12/07/2014  . Secondary cardiomyopathy 11/05/2014  . Malignant hypertension 11/05/2014  . Hyperlipidemia LDL goal <70 11/05/2014  . Cigarette smoker 11/05/2014  . Marijuana abuse 11/05/2014  . Hypokalemia 11/05/2014  . Left hemiparesis 11/05/2014  . Cognitive deficit, post-stroke 11/05/2014  . Left-sided weakness   . Acute right anterior cerebral artery (ACA) ischemic stroke 10/31/2014   Hobble, Lorenda Ishihara, PT   Jorje Guild, PT, DPT [facility] [address] [place], [place], [address] Phone: [phone]   Fax:  [phone] 01/05/2015, 8:28 PM

## 2015-01-11 ENCOUNTER — Ambulatory Visit: Payer: Self-pay | Admitting: Physician Assistant

## 2015-01-12 NOTE — Progress Notes (Signed)
Cardiology Office Note   Date:  01/13/2015   ID:  [name] Short, DOB 24-Jan-1966, MRN [id number]  PCP:  [name], MD  Cardiologist:  Dr. [name]     Chief Complaint  Patient presents with  . Atrial Fibrillation     History of Present Illness: [name] Short is a 49 y.o. male with a hx of untreated HTN, marijuana use who was admitted 10/2014 with an acute R brain CVA.  He was treated with tPA.  He continued to have L sided weakness.  He had an echo that demonstrated EF 35-40%.  He was seen by Dr. Hillis Range for consideration of TEE and Linq ILR. He was not felt to be a good candidate for ILR due to poorly controlled HTN, non-adherence and need for additional OP cardiology evaluation.  TEE did not demonstrate embolic source.  OP event monitor was to be arranged.  The patient was not established with a general cardiologist. Renal artery [place] was neg for RAS.  DCM was felt to be related to HTN heart disease.  However, consideration would be given for outpatient ischemic evaluation.  When seen in FU 5/25, he was in AFib.  He was started on Apixaban.    He returns for FU.  The patient denies chest pain, significant shortness of breath, syncope, orthopnea, PND or significant pedal edema. He is currently attending PT.     Studies/Reports Reviewed Today:  Renal Art [place] 11/04/14 - No evidence of renal artery stenosis noted bilaterally. - Bilateral normal intrarenal resistive indices.  TEE 11/03/14 Findings: Please see echo section for full report. Normal left ventricular size with moderate LV hypertrophy. Diffuse hypokinesis with EF 35%. Normal RV size with mildly decreased systolic function. Trivial mitral regurgitation. Trivial tricuspid regurgitation. Trileaflet aortic valve with no stenosis and mild regurgitation. Mildly dilated left atrium, no LAA thrombus but there was smoke in the appendage. Normal caliber aorta with mild plaque. Negative bubble study, no ASD or PFO.  No  definite source of embolus. EF 35% with moderate LVH and smoke but no thrombus in LA appendage  Echo 10/31/14 - Left ventricle: The cavity size was mildly dilated. Wall thickness was increased in a pattern of severe LVH. Systolic function was moderately reduced. EF 35% to 40%. Diffuse hypokinesis. Doppler parameters are consistent with elevated ventricular end-diastolic filling pressure. - Left atrium: The atrium was moderately dilated. - Atrial septum: No defect or patent foramen ovale was identified. - Pulmonary arteries: PA peak pressure: 45 mm Hg (S). - Impressions: In the absence of HTN or renal failure cannot r/o amyloid. Impressions: In the absence of HTN or renal failure cannot r/o amyloid.  Carotid US 11/01/14 Bilateral: 1-39% ICA stenosis. Vertebral artery flow is antegrade.   Past Medical History  Diagnosis Date  . Hypertension     a. Previously on meds but none in several years.  . Smoker     a. Previous tobacco - quit ~ 2013. Ongoing daily marijuana usage. Occasional cigar.  . Stroke     a. 10/2014 MRI Head: acute mod size nonhemorrhagic R ant cerebral artery distribution infarct involving the medial aspect of the right frontal and parietal lobe and right aspect of the corpus callosum w/ local mass effect-->TPA;  b. 10/2014 MRA: Abrupt cut off of the R ACA A2 segment;  b. 10/2014 Carotid U/S: 1-39% bilat ICA stenosis.  . Cardiomyopathy     a. 10/2014 Echo: EF 35-40%, diff HK, sev LVH, mod dil LA, PASP .  Past Surgical History  Procedure Laterality Date  . Tee without cardioversion N/A 11/03/2014    Procedure: TRANSESOPHAGEAL ECHOCARDIOGRAM (TEE);  Surgeon: Laurey Morale, MD;  Location[facility] ENDOSCOPY;  Service: Cardiovascular;  Laterality: N/A;     Current Outpatient Prescriptions  Medication Sig Dispense Refill  . amLODipine (NORVASC) 10 MG tablet Take 1 tablet (10 mg total) by mouth daily. 30 tablet 1  . apixaban (ELIQUIS) 5 MG TABS tablet Take 1  tablet (5 mg total) by mouth 2 (two) times daily.    Marland Kitchen atorvastatin (LIPITOR) 40 MG tablet Take 1 tablet (40 mg total) by mouth daily at 6 PM. 30 tablet 2  . carvedilol (COREG) 12.5 MG tablet Take 1 tablet (12.5 mg total) by mouth 2 (two) times daily with a meal. 60 tablet 2  . diclofenac sodium (VOLTAREN) 1 % GEL Apply 2 g topically 4 (four) times daily. 1 Tube 1  . lisinopril (PRINIVIL,ZESTRIL) 20 MG tablet Take 1 tablet (20 mg total) by mouth daily. 30 tablet 2  . methylphenidate (RITALIN) 10 MG tablet Take 1 tablet (10 mg total) by mouth 2 (two) times daily with breakfast and lunch. 60 tablet 0  . traZODone (DESYREL) 50 MG tablet Take 1 tablet (50 mg total) by mouth at bedtime as needed for sleep. 30 tablet 2   No current facility-administered medications for this visit.    Allergies:   Review of patient's allergies indicates no known allergies.    Social History:  The patient  reports that he quit smoking about 2 months ago. His smoking use included Cigars. He does not have any smokeless tobacco history on file. He reports that he drinks alcohol. He reports that he uses illicit drugs.   Family History:  The patient's family history includes Diabetes in his mother; Heart attack in his father; Heart disease in his father; Hypertension in his brother, father, and sister; Other in an other family member; Stroke in his sister.    ROS:   Please see the history of present illness.   Review of Systems  Musculoskeletal: Positive for joint pain.  Psychiatric/Behavioral: Positive for depression.  All other systems reviewed and are negative.    PHYSICAL EXAM: VS:  BP 138/82 mmHg  Pulse 78  Ht  (1.778 m)  Wt 182 lb (82.555 kg)  BMI 26.11 kg/m2    Wt Readings from Last 3 Encounters:  01/13/15 182 lb (82.555 kg)  12/24/14 172 lb (78.019 kg)  12/16/14 183 lb (83.008 kg)     GEN: Well nourished, well developed, in no acute distressin a wheelchair playing on phone as I talk  (Facebook) HEENT: normal Neck: no JVD at 90 degrees,   no masses Cardiac:  Normal S1/S2, irregular irregular rhythm; no murmur , no rubs or gallops, no edema    Respiratory:  clear to auscultation bilaterally, no wheezing, rhonchi or rales. GI: soft, nontender, nondistended, + BS MS: no deformity or atrophy Skin: warm and dry  Neuro:  CNs II-XII intact, L leg weak Psych: Normal affect   EKG:  EKG is ordered today.  It demonstrates:   AFib, HR 78   Recent Labs: 11/06/2014: ALT 19; Hemoglobin 13.6; Platelets 169 12/01/2014: BUN 12; Creatinine, Ser 1.15; Potassium 3.6; Sodium 138    Lipid Panel    Component Value Date/Time   CHOL 164 11/01/2014 0601   TRIG 72 11/01/2014 0601   HDL 28* 11/01/2014 0601   CHOLHDL 5.9 11/01/2014 0601   VLDL 14 11/01/2014 0601  LDLCALC 122* 11/01/2014 0601      ASSESSMENT AND PLAN:  Atrial Fibrillation:  Rated controlled.  He is tolerating Eliquis.  FU BMET and CBC drawn today.  At this point, I am not convinced he needs a rhythm control strategy.  He is limited by his CVA.  I am not certain that restoring NSR would make him feel any different.  I would pursue a rate control strategy for now.   Dilated cardiomyopathy:  Probably related to untreated HTN.  No source of embolus on TEE.  Continue beta-blocker, ACE inhibitor.  Given risk factors will need to consider ischemic evaluation.  I will have him repeat his Echo in July (90 days of HF treatment).  If EF still down, consider nuclear stress study.    Hypertensive heart disease without heart failure:   Blood pressure better controlled. Continue current therapy.  Hyperlipidemia LDL goal <70:  Continue statin.    History of stroke:   Follow-up with neurology and rehabilitation as planned.    Cigarette smoker:   He has quit smoking.   Current medicines are reviewed at length with the patient today.  Concerns regarding medicines are as outlined above.  The following changes have been made:     Discontinued Medications   No medications on file   Modified Medications   No medications on file   New Prescriptions   No medications on file    Labs/ tests ordered today include:  Orders Placed This Encounter  Procedures  . EKG 12-Lead  . Echocardiogram    Disposition:   I reviewed his case with Dr. [name] after I saw this patient last time and he agreed to follow him as his primary cardiologist.  FU Dr. [name] after Echo in July 2016.     Signed, Brynda Rim, MHS 01/13/2015 3:56 PM    [facility] [address] [place], [place], [place]  [address] Phone: [phone]; Fax: [phone]

## 2015-01-13 ENCOUNTER — Ambulatory Visit (INDEPENDENT_AMBULATORY_CARE_PROVIDER_SITE_OTHER): Payer: Medicaid Other | Admitting: *Deleted

## 2015-01-13 ENCOUNTER — Other Ambulatory Visit (INDEPENDENT_AMBULATORY_CARE_PROVIDER_SITE_OTHER): Payer: Medicaid Other | Admitting: *Deleted

## 2015-01-13 ENCOUNTER — Encounter: Payer: Self-pay | Admitting: Physician Assistant

## 2015-01-13 ENCOUNTER — Ambulatory Visit (INDEPENDENT_AMBULATORY_CARE_PROVIDER_SITE_OTHER): Payer: Medicaid Other | Admitting: Physician Assistant

## 2015-01-13 VITALS — BP 138/82 | HR 78 | Ht 70.0 in | Wt 182.0 lb

## 2015-01-13 DIAGNOSIS — Z8673 Personal history of transient ischemic attack (TIA), and cerebral infarction without residual deficits: Secondary | ICD-10-CM

## 2015-01-13 DIAGNOSIS — I119 Hypertensive heart disease without heart failure: Secondary | ICD-10-CM | POA: Diagnosis not present

## 2015-01-13 DIAGNOSIS — I420 Dilated cardiomyopathy: Secondary | ICD-10-CM | POA: Diagnosis not present

## 2015-01-13 DIAGNOSIS — I4891 Unspecified atrial fibrillation: Secondary | ICD-10-CM

## 2015-01-13 DIAGNOSIS — E785 Hyperlipidemia, unspecified: Secondary | ICD-10-CM | POA: Diagnosis not present

## 2015-01-13 LAB — CBC WITH DIFFERENTIAL/PLATELET
BASOS ABS: 0 10*3/uL (ref 0.0–0.1)
Basophils Relative: 0.2 % (ref 0.0–3.0)
EOS ABS: 0.7 10*3/uL (ref 0.0–0.7)
Eosinophils Relative: 13.1 % — ABNORMAL HIGH (ref 0.0–5.0)
HCT: 34.9 % — ABNORMAL LOW (ref 39.0–52.0)
Hemoglobin: 11.4 g/dL — ABNORMAL LOW (ref 13.0–17.0)
LYMPHS ABS: 0.7 10*3/uL (ref 0.7–4.0)
Lymphocytes Relative: 12.4 % (ref 12.0–46.0)
MCHC: 32.8 g/dL (ref 30.0–36.0)
MCV: 88 fl (ref 78.0–100.0)
Monocytes Absolute: 0.5 10*3/uL (ref 0.1–1.0)
Monocytes Relative: 9.4 % (ref 3.0–12.0)
NEUTROS ABS: 3.5 10*3/uL (ref 1.4–7.7)
NEUTROS PCT: 64.9 % (ref 43.0–77.0)
Platelets: 191 10*3/uL (ref 150.0–400.0)
RBC: 3.97 Mil/uL — ABNORMAL LOW (ref 4.22–5.81)
RDW: 15.1 % (ref 11.5–15.5)
WBC: 5.4 10*3/uL (ref 4.0–10.5)

## 2015-01-13 LAB — BASIC METABOLIC PANEL
BUN: 14 mg/dL (ref 6–23)
CO2: 29 mEq/L (ref 19–32)
Calcium: 9.1 mg/dL (ref 8.4–10.5)
Chloride: 103 mEq/L (ref 96–112)
Creatinine, Ser: 1 mg/dL (ref 0.40–1.50)
GFR: 102.81 mL/min (ref >60.00)
Glucose, Bld: 78 mg/dL (ref 70–99)
Potassium: 2.9 mEq/L — ABNORMAL LOW (ref 3.5–5.1)
Sodium: 138 mEq/L (ref 135–145)

## 2015-01-13 NOTE — Progress Notes (Signed)
Pt was started on Eliquis 5mg s BID for Afib on 12/16/14.    Reviewed patients medication list.  Pt is not  currently on any combined P-gp and strong CYP3A4 inhibitors/inducers (ketoconazole, traconazole, ritonavir, carbamazepine, phenytoin, rifampin, St. John's wort).  Reviewed labs: SCr 1.00, Hgb 11.4, HCT 34.9, Weight 82.7 kg.  Dose appropriate based on dosing critiera.     A full discussion of the nature of anticoagulants has been carried out.  A benefit/risk analysis has been presented to the patient, so that they understand the justification for choosing anticoagulation with Eliquis at this time.  The need for compliance is stressed.  Pt is aware to take the medication twice daily.  Side effects of potential bleeding are discussed, including unusual colored urine or stools, coughing up blood or coffee ground emesis, nose bleeds or serious fall or head trauma.  Discussed signs and symptoms of stroke. The patient should avoid any OTC items containing aspirin or ibuprofen.  Avoid alcohol consumption.   Call if any signs of abnormal bleeding.  Discussed financial obligations and resolved any difficulty in obtaining medication.    Hgb and HCT lower than previous results, he will have labs (CBC & BMET) repeated in 1 week (01/22/15) and we will monitor those labs and follow up with the patient once resulted.

## 2015-01-13 NOTE — Patient Instructions (Signed)
Medication Instructions:  Your physician recommends that you continue on your current medications as directed. Please refer to the Current Medication list given to you today.   Labwork: NONE  Testing/Procedures: Your physician has requested that you have an echocardiogram.THIS IS TO BE DONE AFTER 01/30/15. Echocardiography is a painless test that uses sound waves to create images of your heart. It provides your doctor with information about the size and shape of your heart and how well your heart's chambers and valves are working. This procedure takes approximately one hour. There are no restrictions for this procedure.  Follow-Up: DR. Anne Fu AFTER ECHO HAS BEEN COMPLETED; APPT CAN BE IN July OR AUGUST   Any Other Special Instructions Will Be Listed Below (If Applicable).

## 2015-01-14 ENCOUNTER — Telehealth: Payer: Self-pay | Admitting: Cardiology

## 2015-01-14 ENCOUNTER — Telehealth: Payer: Self-pay | Admitting: *Deleted

## 2015-01-14 DIAGNOSIS — I429 Cardiomyopathy, unspecified: Secondary | ICD-10-CM

## 2015-01-14 DIAGNOSIS — I4891 Unspecified atrial fibrillation: Secondary | ICD-10-CM

## 2015-01-14 DIAGNOSIS — E876 Hypokalemia: Secondary | ICD-10-CM

## 2015-01-14 DIAGNOSIS — I10 Essential (primary) hypertension: Secondary | ICD-10-CM

## 2015-01-14 MED ORDER — POTASSIUM CHLORIDE CRYS ER 20 MEQ PO TBCR: 40.0000 meq | EXTENDED_RELEASE_TABLET | Freq: Every day | ORAL | Status: DC

## 2015-01-14 NOTE — Telephone Encounter (Signed)
Patient notified of lab results and notations from [place], [place] - "K+ low >> start K+ 40 mEq - take 2 tabs on day 1, then take 1 tab QD >> repeat BMET 1 week. Hgb slightly lower than previous >> Repeat CBC with Iron, TIBC, Ferritin 1 week". Patient verbalized understanding and agreement to continue with current treatment plan.

## 2015-01-14 NOTE — Telephone Encounter (Signed)
Rx for potassium did not go through electronically, so Rx faxed to [facility] at [phone].

## 2015-01-14 NOTE — Telephone Encounter (Signed)
Follow Up ° ° ° °Pt following up on call from earlier. Please call. °

## 2015-01-15 ENCOUNTER — Ambulatory Visit: Payer: Medicaid Other | Admitting: Physical Therapy

## 2015-01-15 ENCOUNTER — Encounter: Payer: Self-pay | Admitting: Occupational Therapy

## 2015-01-15 ENCOUNTER — Ambulatory Visit: Payer: Medicaid Other | Admitting: Occupational Therapy

## 2015-01-15 DIAGNOSIS — R269 Unspecified abnormalities of gait and mobility: Secondary | ICD-10-CM

## 2015-01-15 DIAGNOSIS — R279 Unspecified lack of coordination: Secondary | ICD-10-CM

## 2015-01-15 DIAGNOSIS — IMO0002 Reserved for concepts with insufficient information to code with codable children: Secondary | ICD-10-CM

## 2015-01-15 DIAGNOSIS — R278 Other lack of coordination: Secondary | ICD-10-CM

## 2015-01-15 DIAGNOSIS — R2681 Unsteadiness on feet: Secondary | ICD-10-CM

## 2015-01-15 DIAGNOSIS — I69319 Unspecified symptoms and signs involving cognitive functions following cerebral infarction: Secondary | ICD-10-CM

## 2015-01-15 DIAGNOSIS — I69898 Other sequelae of other cerebrovascular disease: Secondary | ICD-10-CM | POA: Diagnosis not present

## 2015-01-15 DIAGNOSIS — I69359 Hemiplegia and hemiparesis following cerebral infarction affecting unspecified side: Secondary | ICD-10-CM

## 2015-01-15 DIAGNOSIS — I69398 Other sequelae of cerebral infarction: Secondary | ICD-10-CM

## 2015-01-15 NOTE — Therapy (Signed)
[facility] [address] [place], [place], [address] Phone: [phone]   Fax:  [phone]  Physical Therapy Treatment  Patient Details  Name: [name] Short MRN: [id number] Date of Birth: Nov 25, 1965 Referring Provider:  Dessa Phi, MD  Encounter Date: 01/15/2015      PT End of Session - 01/15/15 1532    Visit Number 2   Number of Visits 9  eval + 8 visits   Date for PT Re-Evaluation 03/06/15   Authorization Type Medicaid   Authorization Time Period 6/22 - 03/09/15   PT Start Time 1535   PT Stop Time 1617   PT Time Calculation (min) 42 min   Equipment Utilized During Treatment Gait belt   Activity Tolerance Patient tolerated treatment well   Behavior During Therapy [facility] for tasks assessed/performed      Past Medical History  Diagnosis Date  . Hypertension     a. Previously on meds but none in several years.  . Smoker     a. Previous tobacco - quit ~ 2013. Ongoing daily marijuana usage. Occasional cigar.  . Stroke     a. 10/2014 MRI Head: acute mod size nonhemorrhagic R ant cerebral artery distribution infarct involving the medial aspect of the right frontal and parietal lobe and right aspect of the corpus callosum w/ local mass effect-->TPA;  b. 10/2014 MRA: Abrupt cut off of the R ACA A2 segment;  b. 10/2014 Carotid U/S: 1-39% bilat ICA stenosis.  . Cardiomyopathy     a. 10/2014 Echo: EF 35-40%, diff HK, sev LVH, mod dil LA, PASP .    Past Surgical History  Procedure Laterality Date  . Tee without cardioversion N/A 11/03/2014    Procedure: TRANSESOPHAGEAL ECHOCARDIOGRAM (TEE);  Surgeon: Laurey Morale, MD;  Location: [facility] ENDOSCOPY;  Service: Cardiovascular;  Laterality: N/A;    There were no vitals filed for this visit.  Visit Diagnosis:  Weakness due to cerebrovascular accident  Abnormality of gait following cerebrovascular accident  Decreased coordination      Subjective Assessment -  01/15/15 1539    Subjective Pt reports "trouble rotating myself from my left to my right on the couch" and from "sitting upright to laying on my right."    Patient is accompained by: Family member  girlfriend, Retail banker   Pertinent History HTN, A-Fib, cognitive impairments   Patient Stated Goals pt wishes to be able to walk again.    Currently in Pain? No/denies             Treatment   Therapeutic Activities: - Performed supine <> sit B directions to simulate getting up/down from both couch and bed at home. During supine > sit to R side, pt required multimodal cueing for B hip/knee flexion,for initiation of LUE movement across midline, and for sequencing of transition.While setting up for supine > sit, pt required min verbal cues for positioning/alignment (shoulders far from EOB, LE's close to EOB), tactile cueing provided at L knee during supine scooting for increased weightbearing, proprioceptive. During supine > sit to L side, pt able to initiate transition from supine > L side lying; however, required cueing, assist for LUE management, safe positioning. During sit > supine to L side, pt required mod A for BLE management.  Gait Training: Donned ACE bandage LLE to increased LLE dorsiflexion; surgical cap on L toe for decreased friction, increased clearance during LLE advancement. - Pt performed gait x40' over indoor surfaces without AD with max A of single therapist (positioned  under LUE) providing manual facilitation of lateral weight shift to R side, initiation of LLE advancement during ~50% of gait trial, manual stabilization of LLE during L stance (to prevent L knee buckling) during 100% of trial. - Transitioned to gait x168' total over indoor surfaces without AD with +2A to maximize cueing, active pt participation while maintaining pt safety. Pt required manual stabilization of L knee during stance for initial 40% of trial (pt able to actively stabilize for remainder of trial);  intermittent tactile cueing at L hip flexors to initiate LLE advancement; tactile cueing at L ribcage for L trunk elongation, upright posture; tactile cueing at L hip extensors/abductors for L stance stability; verbal cueing to prevent excessive LLE adduction.                      PT Education - 01/15/15 1706    Education provided Yes   Education Details Effect of postural alignment on ambulation.   Person(s) Educated Patient   Methods Explanation;Tactile cues;Verbal cues   Comprehension Verbalized understanding;Returned demonstration;Need further instruction;Tactile cues required;Verbal cues required          PT Short Term Goals - 01/15/15 1529    PT SHORT TERM GOAL #1   Title The patient will perform HEP with supervision from family for safety. Target Date July 12th.   Baseline Pt dependent for HEP.   Status On-going   PT SHORT TERM GOAL #2   Title The patient will move sit<>stand with SBA to demo improved functional independence. Target Date July 12th.   Baseline Pt requires Min A for sit<>stand.   Status On-going   PT SHORT TERM GOAL #3   Title The patient will ambulate with LRAD and mod A x 50 feet on indoor level surface. Target Date July 12th.    Baseline Pt requires total A for gait < 5'.   Status On-going   PT SHORT TERM GOAL #4   Title Pt will improve PASS score from 19/36 to 21/36 to demo improved functional mobility. Target Date July 12th.    Baseline PASS score: 19/36.   Status On-going           PT Long Term Goals - 01/15/15 1530    PT LONG TERM GOAL #1   Title Pt will verbalize understanding of stroke risk factors and warning signs. Target date: 03/02/15.   Baseline Pt requires total A to recall stroke risk factors and warning signs.   Status On-going   PT LONG TERM GOAL #2   Title pt will improve PASS score from 19/36 to 23/36 to demo improved functional mobility. Target date: 03/02/15.   Baseline PASS score: 19/36.   Status On-going   PT LONG  TERM GOAL #3   Title The patient will ambulate over level surfaces with LRAD and CGA x 75 feet for improved household ambulation.Target date: 03/02/15.   Status On-going               Plan - 01/15/15 1708    Clinical Impression Statement Session focused on bed mobility and gait training with emphasis postural alignment/control, elongation of trunk on L, and LLE activation during gait. Pt very motivated by ambulation and participated in gait trial x168' consecutively and demonstrated significant carryover of cueing provided within this trial. Continue per POC.   Pt will benefit from skilled therapeutic intervention in order to improve on the following deficits Abnormal gait;Decreased coordination;Decreased range of motion;Difficulty walking;Impaired tone;Decreased safety awareness;Impaired UE functional use;Decreased balance;Decreased  knowledge of use of DME;Impaired flexibility;Decreased strength;Decreased mobility;Decreased cognition;Impaired sensation;Postural dysfunction   Rehab Potential Good   Clinical Impairments Affecting Rehab Potential Cognitive deficits as result of stroke (mainly attention, concentration, and initiation); L sided inattention   PT Frequency 2x / week   PT Duration 8 weeks   PT Treatment/Interventions Passive range of motion;Patient/family education;Orthotic Fit/Training;Wheelchair mobility training;Manual techniques;Neuromuscular re-education;Balance training;Therapeutic exercise;Therapeutic activities;Functional mobility training;DME Instruction;Gait training;Stair training;Electrical Stimulation;Biofeedback;ADLs/Self Care Home Management;Cognitive remediation   PT Next Visit Plan Establish HEP for L LE and core strengthening. Bed mobility.   PT Home Exercise Plan Focus on NMR, strengthening for functional return in LLE.   Consulted and Agree with Plan of Care Patient;Family member/caregiver   Family Member Consulted Girlfriend, Retail banker        Problem  List Patient Active Problem List   Diagnosis Date Noted  . Atrial fibrillation 12/25/2014  . Hypertensive heart disease 12/15/2014  . Cardiomyopathy 12/08/2014  . Insomnia 12/07/2014  . Essential hypertension 12/07/2014  . Secondary cardiomyopathy 11/05/2014  . Malignant hypertension 11/05/2014  . Hyperlipidemia LDL goal <70 11/05/2014  . Cigarette smoker 11/05/2014  . Marijuana abuse 11/05/2014  . Hypokalemia 11/05/2014  . Left hemiparesis 11/05/2014  . Cognitive deficit, post-stroke 11/05/2014  . Left-sided weakness   . Acute right anterior cerebral artery (ACA) ischemic stroke 10/31/2014    Jorje Guild, PT, DPT [facility] [address] [place], [place], [address] Phone: [phone]   Fax:  [phone] 01/15/2015, 5:17 PM

## 2015-01-15 NOTE — Therapy (Signed)
[facility] [address] [place], [place], [address] Phone: [phone]   Fax:  [phone]  Occupational Therapy Evaluation  Patient Details  Name: [name] Short MRN: [id number] Date of Birth: February 28, 1966 Referring Provider:  Erick Colace, MD  Encounter Date: 01/15/2015      OT End of Session - 01/15/15 1657    Visit Number 1   Number of Visits 12   Authorization Type Medicaid   Authorization - Visit Number 1   Authorization - Number of Visits 12   OT Start Time 1430   OT Stop Time 1530   OT Time Calculation (min) 60 min   Activity Tolerance Patient tolerated treatment well   Behavior During Therapy [facility] for tasks assessed/performed      Past Medical History  Diagnosis Date  . Hypertension     a. Previously on meds but none in several years.  . Smoker     a. Previous tobacco - quit ~ 2013. Ongoing daily marijuana usage. Occasional cigar.  . Stroke     a. 10/2014 MRI Head: acute mod size nonhemorrhagic R ant cerebral artery distribution infarct involving the medial aspect of the right frontal and parietal lobe and right aspect of the corpus callosum w/ local mass effect-->TPA;  b. 10/2014 MRA: Abrupt cut off of the R ACA A2 segment;  b. 10/2014 Carotid U/S: 1-39% bilat ICA stenosis.  . Cardiomyopathy     a. 10/2014 Echo: EF 35-40%, diff HK, sev LVH, mod dil LA, PASP .    Past Surgical History  Procedure Laterality Date  . Tee without cardioversion N/A 11/03/2014    Procedure: TRANSESOPHAGEAL ECHOCARDIOGRAM (TEE);  Surgeon: Laurey Morale, MD;  Location: [facility] ENDOSCOPY;  Service: Cardiovascular;  Laterality: N/A;    There were no vitals filed for this visit.  Visit Diagnosis:  Decreased coordination - Plan: Ot plan of care cert/re-cert  Unsteadiness on feet - Plan: Ot plan of care cert/re-cert  Hemiparesis affecting nondominant side as late effect of stroke - Plan: Ot plan of care  cert/re-cert  Cognitive deficit due to recent cerebrovascular accident - Plan: Ot plan of care cert/re-cert      Subjective Assessment - 01/15/15 1439    Subjective  I need help getting my pants on and somebody has to stand with me in the bathroom.  They won't let me cook, because they don't want me to hurt myself.   Patient is accompained by: --  girlfriend   Pertinent History HTN           [facility] OT Assessment - 01/15/15 0001    Assessment   Diagnosis right ACA   Precautions   Precautions Fall   Restrictions   Weight Bearing Restrictions No   Balance Screen   Has the patient fallen in the past 6 months Yes   How many times? 2   Is the patient reluctant to leave their home because of a fear of falling?  No   Home  Environment   Family/patient expects to be discharged to: Private residence   Living Arrangements Children   Available Help at Discharge Family   Type of Home Aartment   Home Layout One level   Bathroom Shower/Tub Tub/Shower unit;Curtain   Bathroom Accessibility Yes   How accessible Accessible via wheelchair   Home Equipment Bedside commode;Tub bench   Prior Function   Level of Independence Independent with basic ADLs;Independent with homemaking with ambulation;Independent with gait   Vocation Full time  employment   Nutritional therapist at [employer]   ADL   Lower Body Bathing Moderate assistance   Upper Body Dressing Minimal assistance   Lower Body Dressing Moderate assistance   Toilet Tranfer Minimal assistance   Toileting - Clothing Manipulation Moderate assistance   Toileting -  Hygiene Moderate assistance   Tub/Shower Transfer Minimal assistance   Tub/Shower Transfer Details (indicate cue type and reason tub transfer bench   ADL comments Patient needs assistance with all standing activities at home due to left lower extremity weakness and decreased postural control.     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  [employer]   Mobility   Mobility Status History of falls   Mobility Status Comments left hemiplegia, somatosensory loss, decreased attention to left, delayed motor response impeded mobility   Written Expression   Dominant Hand Right   Vision - History   Baseline Vision No visual deficits   Vision Assessment   Eye Alignment Within Functional Limits   Vision Assessment Vision not tested   Ocular Range of Motion Within Functional Limits   Alignment/Gaze Preference Within Defined Limits   Tracking/Visual Pursuits Decreased smoothness of horizontal tracking   Saccades Additional eye shifts occurred during testing   Cognition   Overall Cognitive Status Impaired/Different from baseline   Area of Impairment Attention;Safety/judgement;Following commands;Awareness   Attention Comments able to sustain attnetion for 5 min at a time during evaluation   Following Commands Follows one step commands with increased time   Following Command Comments significant delay between command and response   Safety/Judgement Decreased awareness of safety   Awareness Emergent   Attention Sustained   Sustained Attention Impaired   Sustained Attention Impairment Functional basic  notable in busy environment   Awareness Impaired   Awareness Impairment Emergent impairment   Problem Solving Impaired   Problem Solving Impairment Functional basic   Executive Function Initiating;Organizing;Sequencing   Observation/Other Assessments   Focus on Therapeutic Outcomes (FOTO)  pending   Sensation   Light Touch Impaired by gross assessment   Stereognosis Impaired by gross assessment   Proprioception Impaired by gross assessment   Coordination   Gross Motor Movements are Fluid and Coordinated No   Fine Motor Movements are Fluid and Coordinated No   Finger Nose Finger Test over shooting with left hand, dysmetria left UE   Right 9 Hole Peg Test 31.94   Left 9 Hole Peg Test 59.65   Coordination Very delayed motor  responses in left upper extremity, and lower extremity   Perception   Perception Impaired   Praxis   Praxis Not tested   Tone   Assessment Location Left Upper Extremity   ROM / Strength   AROM / PROM / Strength AROM   AROM   Overall AROM  Deficits   Overall AROM Comments has active movement throughtout left UE, yet lacks full range of motion.  e.g. shoulder flexion to 100 without overt substitutions   Strength   Overall Strength Deficits   Hand Function   Right Hand Gross Grasp Functional   Right Hand Grip (lbs) 115   Right Hand Lateral Pinch 26 lbs   Left Hand Gross Grasp Impaired   Left Hand Grip (lbs) 45   Left Hand Lateral Pinch 13 lbs   LUE Tone   LUE Tone Hypertonic;Mild  OT Education - 01/15/15 1655    Education provided Yes   Education Details discussed potential goals for OT.  Discussed the importance of "taking care" of left upper extremity to prevent injury~ in wheelchair, in bed, etc.   Person(s) Educated Patient;Spouse   Methods Explanation   Comprehension Verbalized understanding;Verbal cues required          OT Short Term Goals - 01/15/15 1711    OT SHORT TERM GOAL #1   Title Patient will don pants with no more thanmin assist   Baseline Mod assist   Time 4   Period Weeks   Status New   OT SHORT TERM GOAL #2   Title Patient will don socks and shoes with set up assistance and increased time   Baseline mod assist   Time 4   Period Weeks   Status New   OT SHORT TERM GOAL #3   Title Patient will stand at sink to complete hygiene task with no grater than initial min assist and then close supervision   Baseline min to mod assistance   Time 4   Period Weeks   Status New   OT SHORT TERM GOAL #4   Title Patient will transfer to shower using tub transfer bench and close supervision   Baseline mod - minimal asistance   Time 4   Period Weeks   Status New           OT Long Term Goals - 01/15/15 1714    OT  LONG TERM GOAL #1   Title Patient will dress himself with modified independence   Baseline mod   Time 8   Period Weeks   Status New   OT LONG TERM GOAL #2   Title Patient will toilet himself with supervision (includes transfers, hygiene and clothing management)   Baseline mod assist   Time 8   Period Weeks   Status New   OT LONG TERM GOAL #3   Title Patient will prepare simple meal for himself (cold) with supervision, incorporating standing in part   Baseline dependent   Time 8   Period Weeks   Status New   OT LONG TERM GOAL #4   Title Patient will shower himself with supervision(include transfer and bathing)   Baseline mod   Time 8   Period Weeks   Status New               Plan - 01/15/15 1658    Clinical Impression Statement Patient is a 49 year old male with recent hospitalization for right ACA stroke on 10/31/14.  Patient discharged from hospital on 12/03/14.  Patient reports to OT today with the following impairments; left hemiplegia (non dominant) left inattention, decreased sustained attention, decreased sensation left side, decreased coordination, decreased awareness of deficits (emergent) delayed initiation with both verbal and motor responses, and these impded his ability to complete basic self care skills without moderate assistance from a caregiver.  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     Pt will benefit from skilled therapeutic intervention in order to improve on the following deficits (Retired) Decreased balance;Decreased cognition;Decreased coordination;Decreased safety awareness;Impaired sensation;Impaired tone;Impaired UE functional use;Decreased strength;Impaired perceived functional ability;Impaired vision/preception;Decreased knowledge of use of DME;Decreased range of motion   Rehab Potential Good   OT Frequency 2x / week   OT Duration 8 weeks   OT Treatment/Interventions Self-care/ADL  training;Therapeutic exercise;Neuromuscular education;Functional Mobility Training;Passive range of motion;Therapeutic activities;Cognitive  remediation/compensation;Visual/perceptual remediation/compensation;DME and/or AE instruction;Patient/family education;Balance training   Plan ADL need to reduce caregiver burden, dynamic stand balance, coordination left UE   Recommended Other Services SLP - patient declines due to limited visits   Consulted and Agree with Plan of Care Patient;Family member/caregiver   Family Member Consulted girlfriend        Problem List Patient Active Problem List   Diagnosis Date Noted  . Atrial fibrillation 12/25/2014  . Hypertensive heart disease 12/15/2014  . Cardiomyopathy 12/08/2014  . Insomnia 12/07/2014  . Essential hypertension 12/07/2014  . Secondary cardiomyopathy 11/05/2014  . Malignant hypertension 11/05/2014  . Hyperlipidemia LDL goal <70 11/05/2014  . Cigarette smoker 11/05/2014  . Marijuana abuse 11/05/2014  . Hypokalemia 11/05/2014  . Left hemiparesis 11/05/2014  . Cognitive deficit, post-stroke 11/05/2014  . Left-sided weakness   . Acute right anterior cerebral artery (ACA) ischemic stroke 10/31/2014    Collier Salina, OTR/L 01/15/2015, 5:25 PM  [facility] [address] [place], [place], [address] Phone: [phone]   Fax:  [phone]

## 2015-01-18 ENCOUNTER — Ambulatory Visit: Payer: Medicaid Other | Admitting: Physical Therapy

## 2015-01-18 DIAGNOSIS — R269 Unspecified abnormalities of gait and mobility: Secondary | ICD-10-CM

## 2015-01-18 DIAGNOSIS — I69898 Other sequelae of other cerebrovascular disease: Secondary | ICD-10-CM | POA: Diagnosis not present

## 2015-01-18 DIAGNOSIS — IMO0002 Reserved for concepts with insufficient information to code with codable children: Secondary | ICD-10-CM

## 2015-01-18 DIAGNOSIS — R278 Other lack of coordination: Secondary | ICD-10-CM

## 2015-01-18 DIAGNOSIS — R279 Unspecified lack of coordination: Secondary | ICD-10-CM

## 2015-01-18 DIAGNOSIS — I69398 Other sequelae of cerebral infarction: Secondary | ICD-10-CM

## 2015-01-18 NOTE — Patient Instructions (Signed)
Bridging   Slowly raise buttocks from floor, keeping stomach tight. Repeat __15__ times per set. Do _3__ sets per day. [name] Short- place one hand on his left knee and one hand on left hip bone to make sure he's using his left side.  Heel Slide  While lying down, have patient pull left foot up (knee to chest) toward his right shoulder. Provide hands-on help, if needed. Then, assist him in straightening left knee out while bringing foot outward. http://[email or web address].exer.us/371   Copyright  VHI. All rights reserved.

## 2015-01-18 NOTE — Therapy (Signed)
[facility] [address] [place], [place], [address] Phone: [phone]   Fax:  [phone]  Physical Therapy Treatment  Patient Details  Name: [name] Short MRN: [id number] Date of Birth: 1965/09/28 Referring Provider:  Dessa Phi, MD  Encounter Date: 01/18/2015      PT End of Session - 01/18/15 1433    Visit Number 3   Number of Visits 9   Date for PT Re-Evaluation 03/06/15   Authorization Type Medicaid   Authorization Time Period 6/22 - 03/09/15   PT Start Time 1326  Pt late for appt   PT Stop Time 1411   PT Time Calculation (min) 45 min   Equipment Utilized During Treatment Gait belt   Activity Tolerance Patient tolerated treatment well   Behavior During Therapy [facility] for tasks assessed/performed      Past Medical History  Diagnosis Date  . Hypertension     a. Previously on meds but none in several years.  . Smoker     a. Previous tobacco - quit ~ 2013. Ongoing daily marijuana usage. Occasional cigar.  . Stroke     a. 10/2014 MRI Head: acute mod size nonhemorrhagic R ant cerebral artery distribution infarct involving the medial aspect of the right frontal and parietal lobe and right aspect of the corpus callosum w/ local mass effect-->TPA;  b. 10/2014 MRA: Abrupt cut off of the R ACA A2 segment;  b. 10/2014 Carotid U/S: 1-39% bilat ICA stenosis.  . Cardiomyopathy     a. 10/2014 Echo: EF 35-40%, diff HK, sev LVH, mod dil LA, PASP .    Past Surgical History  Procedure Laterality Date  . Tee without cardioversion N/A 11/03/2014    Procedure: TRANSESOPHAGEAL ECHOCARDIOGRAM (TEE);  Surgeon: Laurey Morale, MD;  Location: [facility] ENDOSCOPY;  Service: Cardiovascular;  Laterality: N/A;    There were no vitals filed for this visit.  Visit Diagnosis:  Weakness due to cerebrovascular accident  Abnormality of gait following cerebrovascular accident  Decreased coordination      Subjective Assessment -  01/18/15 1446    Subjective Girlfriend reports having assisted pt in walking from bedroom to/from bathroom. No falls to report.   Patient is accompained by: Family member  girlfriend, [name]   Pertinent History HTN, A-Fib, cognitive impairments   Patient Stated Goals pt wishes to be able to walk again.    Currently in Pain? No/denies      Treatment   Gait Training: Donned L [place] AFO for increased dorsiflexion assist. Pt performed gait x115' over level, indoor surfaces with rolling walker and +2A for safety (this PT and pt's girlfriend). Initiated hands-on training with girlfriend; emphasized standing on L side, avoiding holding onto LUE. Will continue to reinforce during future sessions; however, currently recommending girlfriend wait until pt gets personal AFO, continues gait training with AFO in therapy, and receives more hands-on training prior to performing gait within home. Multimodal cueing focused on lateral weight shift to R side, increased L hip/knee flexion during LLE advancement, L single limb stance stability, decreasing LLE adduction, upright posture/forward gaze. Pt demonstrating tendency to raise R heel off ground to initiate LLE advancement.   Therapeutic Exercises: - Instructed pt/girlfriend in supine bridging x15 reps with tactile cueing at L iliac crest for increased L gluteus maximus/medius activation, at L knee for proprioceptive input, weightbearing. Added to HEP. - Instructed pt/girlfriend in LLE flexion/extension AAROM with manual assist from girlfriend, the progressed to D2 flexion/extension to emphasize LLE adduction during  LLE extension to address narrow BOS during gait. Added to HEP.   Therapeutic Activities: - During supine > sit to L side, instructed girlfriend to remind pt to move LUE across midline to transition from supine > R side lying, as pt requires only supervision using this technique. May need reinforcement in future sessions. - Educated  pt/girlfriend on importance of "active sitting" position (addressing posterior pelvic tilt, thoracic kyphosis, forward head, rounded shoulders) in seated and when preparing for sit > stand transfer.                          PT Education - 01/18/15 1500    Education provided Yes   Education Details Initiiated HEP. Recommended girlfriend wait until pt requires less assist with ambulation prior to assisting pt with walking at home.    Person(s) Educated Patient;Spouse   Methods Explanation;Demonstration;Tactile cues;Verbal cues;Handout   Comprehension Verbalized understanding;Returned demonstration          PT Short Term Goals - 01/15/15 1529    PT SHORT TERM GOAL #1   Title The patient will perform HEP with supervision from family for safety. Target Date July 12th.   Baseline Pt dependent for HEP.   Status On-going   PT SHORT TERM GOAL #2   Title The patient will move sit<>stand with SBA to demo improved functional independence. Target Date July 12th.   Baseline Pt requires Min A for sit<>stand.   Status On-going   PT SHORT TERM GOAL #3   Title The patient will ambulate with LRAD and mod A x 50 feet on indoor level surface. Target Date July 12th.    Baseline Pt requires total A for gait < 5'.   Status On-going   PT SHORT TERM GOAL #4   Title Pt will improve PASS score from 19/36 to 21/36 to demo improved functional mobility. Target Date July 12th.    Baseline PASS score: 19/36.   Status On-going           PT Long Term Goals - 01/15/15 1530    PT LONG TERM GOAL #1   Title Pt will verbalize understanding of stroke risk factors and warning signs. Target date: 03/02/15.   Baseline Pt requires total A to recall stroke risk factors and warning signs.   Status On-going   PT LONG TERM GOAL #2   Title pt will improve PASS score from 19/36 to 23/36 to demo improved functional mobility. Target date: 03/02/15.   Baseline PASS score: 19/36.   Status On-going   PT LONG  TERM GOAL #3   Title The patient will ambulate over level surfaces with LRAD and CGA x 75 feet for improved household ambulation.Target date: 03/02/15.   Status On-going               Plan - 01/18/15 1510    Clinical Impression Statement During gait training, pt exhibited improved L single limb stance stability (no episodes of L knee buckling today). HEP initiated to address LLE adduction during gait. Recommended girlfriend wait until after pt gets L AFO and participates in further gait training prior to assisting with ambulation at home. Continue per POC.   Pt will benefit from skilled therapeutic intervention in order to improve on the following deficits Abnormal gait;Decreased coordination;Decreased range of motion;Difficulty walking;Impaired tone;Decreased safety awareness;Impaired UE functional use;Decreased balance;Decreased knowledge of use of DME;Impaired flexibility;Decreased strength;Decreased mobility;Decreased cognition;Impaired sensation;Postural dysfunction   Rehab Potential Good   Clinical Impairments Affecting  Rehab Potential Cognitive impairments, L inattention   PT Frequency 2x / week   PT Duration 4 weeks  as pt approved for 8 sessions + eval   PT Next Visit Plan Assess ambulation with new custom AFO. Follow up on HEP performance. Continue gait training.   PT Home Exercise Plan Consider decreasing frequency of appts to 1x/week due to insurance approval of only 8 sessions. Focus on NMR, strengthening for functional return in LLE.   Consulted and Agree with Plan of Care Patient;Family member/caregiver   Family Member Consulted Girlfriend, Retail banker        Problem List Patient Active Problem List   Diagnosis Date Noted  . Atrial fibrillation 12/25/2014  . Hypertensive heart disease 12/15/2014  . Cardiomyopathy 12/08/2014  . Insomnia 12/07/2014  . Essential hypertension 12/07/2014  . Secondary cardiomyopathy 11/05/2014  . Malignant hypertension 11/05/2014  .  Hyperlipidemia LDL goal <70 11/05/2014  . Cigarette smoker 11/05/2014  . Marijuana abuse 11/05/2014  . Hypokalemia 11/05/2014  . Left hemiparesis 11/05/2014  . Cognitive deficit, post-stroke 11/05/2014  . Left-sided weakness   . Acute right anterior cerebral artery (ACA) ischemic stroke 10/31/2014   Jorje Guild, PT, DPT [facility] [address] [place], [place], [address] Phone: [phone]   Fax:  [phone] 01/18/2015, 3:15 PM

## 2015-01-19 ENCOUNTER — Encounter: Payer: Self-pay | Admitting: Physical Medicine & Rehabilitation

## 2015-01-19 ENCOUNTER — Ambulatory Visit (HOSPITAL_BASED_OUTPATIENT_CLINIC_OR_DEPARTMENT_OTHER): Payer: Medicaid Other | Admitting: Physical Medicine & Rehabilitation

## 2015-01-19 VITALS — BP 158/110 | HR 71 | Resp 14

## 2015-01-19 DIAGNOSIS — G8190 Hemiplegia, unspecified affecting unspecified side: Secondary | ICD-10-CM | POA: Diagnosis not present

## 2015-01-19 DIAGNOSIS — I63521 Cerebral infarction due to unspecified occlusion or stenosis of right anterior cerebral artery: Secondary | ICD-10-CM

## 2015-01-19 DIAGNOSIS — G8194 Hemiplegia, unspecified affecting left nondominant side: Secondary | ICD-10-CM

## 2015-01-19 DIAGNOSIS — I69398 Other sequelae of cerebral infarction: Secondary | ICD-10-CM

## 2015-01-19 DIAGNOSIS — I69319 Unspecified symptoms and signs involving cognitive functions following cerebral infarction: Secondary | ICD-10-CM

## 2015-01-19 DIAGNOSIS — I6931 Cognitive deficits following cerebral infarction: Secondary | ICD-10-CM

## 2015-01-19 DIAGNOSIS — R269 Unspecified abnormalities of gait and mobility: Secondary | ICD-10-CM

## 2015-01-19 MED ORDER — METHYLPHENIDATE HCL 10 MG PO TABS: 10.0000 mg | ORAL_TABLET | Freq: Two times a day (BID) | ORAL | Status: DC

## 2015-01-19 NOTE — Patient Instructions (Signed)
We will continue the Ritalin , reassess next month, if excessive activity or increasing problems with safety I would stop this

## 2015-01-19 NOTE — Progress Notes (Signed)
Subjective:    Patient ID: [name] Short, male    DOB: 1966/01/03, 49 y.o.   MRN: [id number] 49 y.o.right handed  male with history of hypertension as well as remote tobacco abuse. Patient on no scheduled medications upon admission. Patient independent prior to admission living with his girlfriend working at [employer]. Presented 10/31/2014 with left-sided weakness and altered mental status. Noted blood pressure 220/110. Urine drug screen positive for marijuana. MRI of the brain showed acute ischemic right ACA territory infarct. 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 Irregularity of the A2 segment of the anterior cerebral bilaterally with abrupt cut off of flow at the A2 segment of the right ACA 1.5 cm above its origin consistent with patient's acute right ACA distribution infarct. Carotid Dopplers were no ICA stenosis. Echocardiogram with ejection fraction 35-40% with diffuse hypokinesis. Venous Doppler studies lower extremities negative for DVT. Neurology consulted patient did  receive TPA. Presently maintained on aspirin/Plavix   HPI Currently attending outpatient PT and OT and speech therapy Patient had a calf spasm on the left side but only one so far. No falls Patient still needs assistance for dressing and bathing as well as transfers.   Pain Inventory Average Pain 0 Pain Right Now 0 My pain is no pain  In the last 24 hours, has pain interfered with the following? General activity 0 Relation with others 0 Enjoyment of life 0 What TIME of day is your pain at its worst? no pain Sleep (in general) Fair  Pain is worse with: no pain Pain improves with: no pain Relief from Meds: no pain  Mobility walk with assistance use a cane ability to climb steps?  no do you drive?  no use a wheelchair Do you have any goals in this area?  yes  Function not  employed: date last employed . I need assistance with the following:  dressing, bathing, toileting, meal prep and household duties  Neuro/Psych weakness trouble walking depression  Prior Studies Any changes since last visit?  no  Physicians involved in your care Any changes since last visit?  no   Family History  Problem Relation Age of Onset  . Other      no premature cad  . Diabetes Mother   . Heart disease Father   . Heart attack Father   . Stroke Sister   . Hypertension Father   . Hypertension Brother   . Hypertension Sister    History   Social History  . Marital Status: Single    Spouse Name: N/A  . Number of Children: N/A  . Years of Education: N/A   Social History Main Topics  . Smoking status: Former Smoker    Types: Cigars    Quit date: 10/31/2014  . Smokeless tobacco: Not on file     Comment: previously smoked cigarettes - quit ~ 2013.  Marland Kitchen. Alcohol Use: 0.0 oz/week    0 Standard drinks or equivalent per week     Comment: occasional  . Drug Use: Yes     Comment: Marijuana daily. Has not had marijuana since being in hospital.  . Sexual Activity: Not on file   Other Topics Concern  . None   Social History Narrative   [place].  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   Past Surgical History  Procedure Laterality Date  .  Tee without cardioversion N/A 11/03/2014    Procedure: TRANSESOPHAGEAL ECHOCARDIOGRAM (TEE);  Surgeon: Laurey Morale, MD;  Location: [facility] ENDOSCOPY;  Service: Cardiovascular;  Laterality: N/A;   Past Medical History  Diagnosis Date  . Hypertension     a. Previously on meds but none in several years.  . Smoker     a. Previous tobacco - quit ~ 2013. Ongoing daily marijuana usage. Occasional cigar.  . Stroke     a. 10/2014 MRI Head: acute mod size nonhemorrhagic R ant cerebral artery distribution infarct involving the medial aspect of the right frontal and parietal lobe and right aspect of the corpus callosum w/  local mass effect-->TPA;  b. 10/2014 MRA: Abrupt cut off of the R ACA A2 segment;  b. 10/2014 Carotid U/S: 1-39% bilat ICA stenosis.  . Cardiomyopathy     a. 10/2014 Echo: EF 35-40%, diff HK, sev LVH, mod dil LA, PASP .   BP 158/110 mmHg  Pulse 71  Resp 14  SpO2 100%  Opioid Risk Score:   Fall Risk Score: Moderate Fall Risk (6-13 points)`1  Depression screen PHQ 2/9  Depression screen [facility] 2/9 12/25/2014 12/24/2014 12/07/2014  Decreased Interest 0 0 3  Down, Depressed, Hopeless 0 0 0  PHQ - 2 Score 0 0 3  Altered sleeping 0 - 3  Tired, decreased energy 0 - 3  Change in appetite 0 - 0  Feeling bad or failure about yourself  0 - 3  Trouble concentrating 0 - 3  Moving slowly or fidgety/restless 0 - 0  Suicidal thoughts 0 - 0  PHQ-9 Score 0 - 15     Review of Systems  Musculoskeletal: Positive for gait problem.  Neurological: Positive for weakness.  Psychiatric/Behavioral: Positive for dysphoric mood.  All other systems reviewed and are negative.      Objective:   Physical Exam  Constitutional: He is oriented to person, place, and time. He appears well-developed and well-nourished.  HENT:  Head: Normocephalic and atraumatic.  Eyes: Conjunctivae and EOM are normal. Pupils are equal, round, and reactive to light.  Neck: Normal range of motion.  Neurological: He is alert and oriented to person, place, and time.  Psychiatric: He has a normal mood and affect.  Nursing note and vitals reviewed.   Sensation absent light touch in the left upper and left lower limb Motor strength is 4/5 in the left deltoid, biceps, triceps, grip Patient has poor motor control with decreased fine motor movements in left upper extremity 3 minus in the left hip flexor 3 minus left knee extensors 0 at the ankle dorsiflexor and plantar flexor  Mood and affect are flat, He answers questions appropriately when directly asked, there is very poor initiation and the patient gets distracted by looking at  his fingernails      Assessment & Plan:   1. Right ACA distribution infarct with left hemiparesis, cognitive deficits related to CVA, left hemisensory deficits. He continues to benefit from outpatient PT OT and speech Continue Ritalin 10 mg twice a day, reassess use next month. If he becomes more impulsive, would reduce dose Return to clinic one month

## 2015-01-22 ENCOUNTER — Ambulatory Visit: Payer: Medicaid Other | Attending: Physical Medicine & Rehabilitation | Admitting: Physical Therapy

## 2015-01-22 ENCOUNTER — Other Ambulatory Visit (INDEPENDENT_AMBULATORY_CARE_PROVIDER_SITE_OTHER): Payer: Medicaid Other | Admitting: *Deleted

## 2015-01-22 DIAGNOSIS — I10 Essential (primary) hypertension: Secondary | ICD-10-CM

## 2015-01-22 DIAGNOSIS — I69359 Hemiplegia and hemiparesis following cerebral infarction affecting unspecified side: Secondary | ICD-10-CM

## 2015-01-22 DIAGNOSIS — I4891 Unspecified atrial fibrillation: Secondary | ICD-10-CM | POA: Diagnosis not present

## 2015-01-22 DIAGNOSIS — I69898 Other sequelae of other cerebrovascular disease: Secondary | ICD-10-CM | POA: Insufficient documentation

## 2015-01-22 DIAGNOSIS — I6981 Cognitive deficits following other cerebrovascular disease: Secondary | ICD-10-CM | POA: Insufficient documentation

## 2015-01-22 DIAGNOSIS — I69859 Hemiplegia and hemiparesis following other cerebrovascular disease affecting unspecified side: Secondary | ICD-10-CM | POA: Insufficient documentation

## 2015-01-22 DIAGNOSIS — I429 Cardiomyopathy, unspecified: Secondary | ICD-10-CM | POA: Diagnosis not present

## 2015-01-22 DIAGNOSIS — R279 Unspecified lack of coordination: Secondary | ICD-10-CM | POA: Insufficient documentation

## 2015-01-22 DIAGNOSIS — R531 Weakness: Secondary | ICD-10-CM | POA: Insufficient documentation

## 2015-01-22 DIAGNOSIS — R269 Unspecified abnormalities of gait and mobility: Secondary | ICD-10-CM | POA: Insufficient documentation

## 2015-01-22 DIAGNOSIS — I69998 Other sequelae following unspecified cerebrovascular disease: Secondary | ICD-10-CM | POA: Insufficient documentation

## 2015-01-22 DIAGNOSIS — I69398 Other sequelae of cerebral infarction: Secondary | ICD-10-CM

## 2015-01-22 DIAGNOSIS — E876 Hypokalemia: Secondary | ICD-10-CM | POA: Diagnosis not present

## 2015-01-22 DIAGNOSIS — R2681 Unsteadiness on feet: Secondary | ICD-10-CM | POA: Insufficient documentation

## 2015-01-22 DIAGNOSIS — R278 Other lack of coordination: Secondary | ICD-10-CM

## 2015-01-22 LAB — BASIC METABOLIC PANEL
BUN: 17 mg/dL (ref 6–23)
CO2: 25 mEq/L (ref 19–32)
CREATININE: 1.1 mg/dL (ref 0.40–1.50)
Calcium: 9.2 mg/dL (ref 8.4–10.5)
Chloride: 106 mEq/L (ref 96–112)
GFR: 92.1 mL/min (ref >60.00)
Glucose, Bld: 66 mg/dL — ABNORMAL LOW (ref 70–99)
POTASSIUM: 3.7 meq/L (ref 3.5–5.1)
Sodium: 138 mEq/L (ref 135–145)

## 2015-01-22 LAB — CBC
HEMATOCRIT: 35.2 % — AB (ref 39.0–52.0)
Hemoglobin: 11.4 g/dL — ABNORMAL LOW (ref 13.0–17.0)
MCHC: 32.5 g/dL (ref 30.0–36.0)
MCV: 89.3 fl (ref 78.0–100.0)
Platelets: 176 10*3/uL (ref 150.0–400.0)
RBC: 3.94 Mil/uL — ABNORMAL LOW (ref 4.22–5.81)
RDW: 15.4 % (ref 11.5–15.5)
WBC: 5 10*3/uL (ref 4.0–10.5)

## 2015-01-22 LAB — FERRITIN: FERRITIN: 231.9 ng/mL (ref 22.0–322.0)

## 2015-01-22 NOTE — Addendum Note (Signed)
Addended by: Tonita PhoenixBOWDEN, Icey Tello K on: 01/22/2015 11:34 AM   Modules accepted: Orders

## 2015-01-22 NOTE — Therapy (Signed)
[facility] [address] [place], [place], [address] Phone: [phone]   Fax:  [phone]  Physical Therapy Treatment  Patient Details  Name: [name] Short MRN: [id number] Date of Birth: December 23, 1965 Referring Provider:  Dessa Phi, MD  Encounter Date: 01/22/2015      PT End of Session - 01/22/15 1713    Visit Number 4   Number of Visits 9   Date for PT Re-Evaluation 03/06/15   Authorization Type Medicaid   Authorization Time Period 6/22 - 03/09/15   PT Start Time 1016   PT Stop Time 1104   PT Time Calculation (min) 48 min   Equipment Utilized During Treatment Gait belt   Activity Tolerance Patient tolerated treatment well   Behavior During Therapy [facility] for tasks assessed/performed      Past Medical History  Diagnosis Date  . Hypertension     a. Previously on meds but none in several years.  . Smoker     a. Previous tobacco - quit ~ 2013. Ongoing daily marijuana usage. Occasional cigar.  . Stroke     a. 10/2014 MRI Head: acute mod size nonhemorrhagic R ant cerebral artery distribution infarct involving the medial aspect of the right frontal and parietal lobe and right aspect of the corpus callosum w/ local mass effect-->TPA;  b. 10/2014 MRA: Abrupt cut off of the R ACA A2 segment;  b. 10/2014 Carotid U/S: 1-39% bilat ICA stenosis.  . Cardiomyopathy     a. 10/2014 Echo: EF 35-40%, diff HK, sev LVH, mod dil LA, PASP .    Past Surgical History  Procedure Laterality Date  . Tee without cardioversion N/A 11/03/2014    Procedure: TRANSESOPHAGEAL ECHOCARDIOGRAM (TEE);  Surgeon: Laurey Morale, MD;  Location: [facility] ENDOSCOPY;  Service: Cardiovascular;  Laterality: N/A;    There were no vitals filed for this visit.  Visit Diagnosis:  Abnormality of gait following cerebrovascular accident  Decreased coordination  Hemiparesis affecting nondominant side as late effect of stroke      Subjective Assessment -  01/22/15 1709    Subjective Girlfriend reporting that pt feeling "depressed" by inability to pick up custom AFO Wednesday due to awaiting Medicaid approval. Girlfirend states, "I did try to have him walk with the walker today; he just drags that foot."   Patient is accompained by: Family member  girlfriend, [name]   Pertinent History HTN, A-Fib, cognitive impairments   Patient Stated Goals pt wishes to be able to walk again.    Currently in Pain? No/denies           Treatment   Neuro Re-ed: - Supine LLE D2 flexion/extension x20 reps rhythmic initiation,to emphasize initiation of LLE advancement via L hip flexion; multimodal cueing focused on activation L ankle dorsiflexion. NMES on L anterior tib to promote functional return of dorsiflexion. No activation of L anterior tib noted during this session.  Gait Training: Donned L AFO (Reaction) for dorsiflexion assist, initiation of LLE advancement,and for tactile feedback to prevent knee buckling. Added modified toe cap to L shoe for LLE clearance.  - Gait x50 ' then x75' over level indoor surfaces with PT providing mod A, verbal/tactile cueing at L axilla, R ribcage for upright posture; verbal cueing for LLE stance stability. Mirror (placed anterior to pt for visual feedback of LLE placement/advancement) effective in correcting excessive L hip internal rotation and in improving L hip/knee flexion. Pt continues to demonstrate difficulty initiating LLE advancement (and therefore attempts to compensate with R  ankle plantarflexion, L pelvic elevation). - Gait x60' over level surfaces with RW and min A of girlfriend; this PT also providing min guard, tactile cueing for lateral weight shift to R side, cueing to address compensatory strategies described above.                        PT Education - 01/22/15 1711    Education provided Yes   Education Details Importance of paying attention to LUE/LLE position prior to initiating  movement, transfers.   Person(s) Educated Patient;Spouse   Methods Explanation;Demonstration;Verbal cues   Comprehension Verbalized understanding;Need further instruction          PT Short Term Goals - 01/22/15 1718    PT SHORT TERM GOAL #1   Title The patient will perform HEP with supervision from family for safety. Target Date July 12th.   Baseline Pt dependent for HEP.   Status On-going   PT SHORT TERM GOAL #2   Title The patient will move sit<>stand with SBA to demo improved functional independence. Target Date July 12th.   Baseline Pt requires Min A for sit<>stand.   Status On-going   PT SHORT TERM GOAL #3   Title The patient will ambulate with LRAD and mod A x 50 feet on indoor level surface. Target Date July 12th.    Baseline Achieved 01/22/15, as pt ambulated x60' with RW and min A.   Status Achieved   PT SHORT TERM GOAL #4   Title Pt will improve PASS score from 19/36 to 21/36 to demo improved functional mobility. Target Date July 12th.    Baseline PASS score: 19/36.   Status On-going           PT Long Term Goals - 01/15/15 1530    PT LONG TERM GOAL #1   Title Pt will verbalize understanding of stroke risk factors and warning signs. Target date: 03/02/15.   Baseline Pt requires total A to recall stroke risk factors and warning signs.   Status On-going   PT LONG TERM GOAL #2   Title pt will improve PASS score from 19/36 to 23/36 to demo improved functional mobility. Target date: 03/02/15.   Baseline PASS score: 19/36.   Status On-going   PT LONG TERM GOAL #3   Title The patient will ambulate over level surfaces with LRAD and CGA x 75 feet for improved household ambulation.Target date: 03/02/15.   Status On-going               Plan - 01/22/15 1713    Clinical Impression Statement Pt continuing to demonstrate improvement in LLE stance stability during gait training, as exhibited by ability to stabilize L knee throughout session wearing L AFO (Reaction). During  final trial, pt ambulated x60' with RW and min A and cueing for LLE advancement. Continue per POC.   Pt will benefit from skilled therapeutic intervention in order to improve on the following deficits Abnormal gait;Decreased coordination;Decreased range of motion;Difficulty walking;Impaired tone;Decreased safety awareness;Impaired UE functional use;Decreased balance;Decreased knowledge of use of DME;Impaired flexibility;Decreased strength;Decreased mobility;Decreased cognition;Impaired sensation;Postural dysfunction   Rehab Potential Good   Clinical Impairments Affecting Rehab Potential Cognitive impairments, L inattention   PT Frequency 2x / week   PT Duration 4 weeks   PT Treatment/Interventions Passive range of motion;Patient/family education;Orthotic Fit/Training;Wheelchair mobility training;Manual techniques;Neuromuscular re-education;Balance training;Therapeutic exercise;Therapeutic activities;Functional mobility training;DME Instruction;Gait training;Stair training;Electrical Stimulation;Biofeedback;ADLs/Self Care Home Management;Cognitive remediation   PT Next Visit Plan Assess ambulation with new custom AFO.  Continue gait training. When safe/appropriate focus on family training for girlfriend to assist at home. NMES for L ankle dorsiflexion/eversion   PT Home Exercise Plan Consider decreasing frequency of appts to 1x/week due to insurance approval of only 8 sessions. Focus on NMR, strengthening for functional return in LLE.   Consulted and Agree with Plan of Care Patient;Family member/caregiver   Family Member Consulted Girlfriend, Retail bankerherivian        Problem List Patient Active Problem List   Diagnosis Date Noted  . Gait disturbance, post-stroke 01/19/2015  . Atrial fibrillation 12/25/2014  . Hypertensive heart disease 12/15/2014  . Cardiomyopathy 12/08/2014  . Insomnia 12/07/2014  . Essential hypertension 12/07/2014  . Secondary cardiomyopathy 11/05/2014  . Malignant hypertension  11/05/2014  . Hyperlipidemia LDL goal <70 11/05/2014  . Cigarette smoker 11/05/2014  . Marijuana abuse 11/05/2014  . Hypokalemia 11/05/2014  . Left hemiparesis 11/05/2014  . Cognitive deficit, post-stroke 11/05/2014  . Left-sided weakness   . Acute right anterior cerebral artery (ACA) ischemic stroke 10/31/2014    Jorje GuildBlair Leonda Cristo, PT, DPT [facility] [address] [place], [place], [address] Phone: [phone]   Fax:  [phone] 01/22/2015, 5:19 PM

## 2015-01-26 ENCOUNTER — Telehealth: Payer: Self-pay | Admitting: *Deleted

## 2015-01-26 NOTE — Telephone Encounter (Signed)
s/w pt and his wife about lab results by phone with verbal understanding.

## 2015-01-26 NOTE — Telephone Encounter (Signed)
Called patient to schedule a 3 month follow up appointment for anticoagulation-Eliquis. Left the CVRR number in the message.

## 2015-01-27 ENCOUNTER — Ambulatory Visit: Payer: Medicaid Other | Admitting: Occupational Therapy

## 2015-01-27 ENCOUNTER — Ambulatory Visit: Payer: Medicaid Other | Admitting: Physical Therapy

## 2015-01-27 ENCOUNTER — Encounter: Payer: Medicaid Other | Admitting: Occupational Therapy

## 2015-01-27 ENCOUNTER — Encounter: Payer: Self-pay | Admitting: Occupational Therapy

## 2015-01-27 DIAGNOSIS — R269 Unspecified abnormalities of gait and mobility: Principal | ICD-10-CM

## 2015-01-27 DIAGNOSIS — R279 Unspecified lack of coordination: Secondary | ICD-10-CM

## 2015-01-27 DIAGNOSIS — I69319 Unspecified symptoms and signs involving cognitive functions following cerebral infarction: Secondary | ICD-10-CM

## 2015-01-27 DIAGNOSIS — I69998 Other sequelae following unspecified cerebrovascular disease: Secondary | ICD-10-CM | POA: Diagnosis not present

## 2015-01-27 DIAGNOSIS — I69398 Other sequelae of cerebral infarction: Secondary | ICD-10-CM

## 2015-01-27 DIAGNOSIS — I69359 Hemiplegia and hemiparesis following cerebral infarction affecting unspecified side: Secondary | ICD-10-CM

## 2015-01-27 DIAGNOSIS — R278 Other lack of coordination: Secondary | ICD-10-CM

## 2015-01-27 DIAGNOSIS — R2681 Unsteadiness on feet: Secondary | ICD-10-CM

## 2015-01-27 NOTE — Therapy (Signed)
[facility] [address] [place], [place], [address] Phone: [phone]   Fax:  [phone]  Physical Therapy Treatment  Patient Details  Name: [name] ColeCarl Short MRN: [id number] Date of Birth: 1966-04-22 Referring Provider:  Dessa PhiFunches, Josalyn, MD  Encounter Date: 01/27/2015      PT End of Session - 01/27/15 1733    Visit Number 5   Number of Visits 9   Date for PT Re-Evaluation 03/06/15   Authorization Type Medicaid   Authorization Time Period 6/22 - 03/09/15   PT Start Time 1318   PT Stop Time 1402   PT Time Calculation (min) 44 min   Equipment Utilized During Treatment Gait belt   Activity Tolerance Patient tolerated treatment well   Behavior [facility] Therapy [facility] for tasks assessed/performed      Past Medical History  Diagnosis Date  . Hypertension     a. Previously on meds but none in several years.  . Smoker     a. Previous tobacco - quit ~ 2013. Ongoing daily marijuana usage. Occasional cigar.  . Stroke     a. 10/2014 MRI Head: acute mod size nonhemorrhagic R ant cerebral artery distribution infarct involving the medial aspect of the right frontal and parietal lobe and right aspect of the corpus callosum w/ local mass effect-->TPA;  b. 10/2014 MRA: Abrupt cut off of the R ACA A2 segment;  b. 10/2014 Carotid U/S: 1-39% bilat ICA stenosis.  . Cardiomyopathy     a. 10/2014 Echo: EF 35-40%, diff HK, sev LVH, mod dil LA, PASP 45mmHg.    Past Surgical History  Procedure Laterality Date  . Tee without cardioversion N/A 11/03/2014    Procedure: TRANSESOPHAGEAL ECHOCARDIOGRAM (TEE);  Surgeon: Laurey Moralealton S McLean, MD;  [facility];  Service: Cardiovascular;  Laterality: N/A;    There were no vitals filed for this visit.  Visit Diagnosis:  Abnormality of gait following cerebrovascular accident  Decreased coordination  Hemiparesis affecting nondominant side as late effect of stroke      Subjective Assessment -  01/27/15 1728    Subjective Pt still without custom AFO. Per girlfriend, planned to received on 7/14. Girlfriend reports pt "got up and walked to the bathroom on his own" using walker this morning.   Patient is accompained by: Family member   Pertinent History HTN, A-Fib, cognitive impairments   Patient Stated Goals pt wishes to be able to walk again.    Currently in Pain? No/denies      Treatment    Therapeutic Activities: - Pt performed multiple sit <> stand [facility] with supervision (with and without RW) with cueing for LLE placement. - See Patient Education  Neuro Re-ed: Pt performed the following in tall kneeling without UE support for increased proximal stability in LLE, increased initiation of LLE advancement (during ambulation) using L hip flexors as opposed to compensating with R ankle plantarflexion, trunk extension: forward/retro stepping 5 x4 steps per direction; tapping L knee on visual target (placed anterior to pt) to promote isolation of L hip flexion; B sidestepping 2 x2 steps per direction. Multimodal cueing focused on lateral weight shifting to R side, upright posture. Noted activation of L hip flexors, L gluteus medius/maximus during activity.  Gait Training: NMES on L anterior tib to promote functional return of dorsiflexion. No activation of L anterior tib noted during current session. Donned L AFO (Reaction) for dorsiflexion assist, initiation of LLE advancement,and for tactile feedback to prevent knee buckling. Added modified toe cap  to L shoe for LLE clearance.  - Gait x75 ' over level indoor surfaces with PT providing mod A, verbal/tactile cueing at L axilla, R ribcage for upright posture; verbal cueing for LLE stance stability. Tactile cueing (resistance during LLE advancement) somewhat effective in correcting excessive L hip internal rotation and in improving L hip/knee flexion.  Gait x100' over level surfaces with RW and min A, tactile cueing for lateral  weight shift to R side, cueing to address the following compensatory strategies to initiate LLE advancement: R ankle plantarflexion, L pelvic elevation, and trunk extension.                    [facility] Adult PT Treatment/Exercise - 01/27/15 1732    Ambulation/Gait   Ambulation/Gait Yes   Ambulation Distance (Feet) 175 Feet  x75' without AD then x100' with AD   Assistive device Rolling walker;None   Gait Pattern Step-to pattern;Decreased step length - left;Decreased stance time - left;Decreased hip/knee flexion - left;Decreased dorsiflexion - left;Decreased weight shift to right;Left flexed knee in stance;Trunk rotated posteriorly on left;Narrow base of support  excessive L hip internal rotation, adduction   Ambulation Surface Level;Indoor                PT Education - 01/27/15 1729    Education provided Yes   Education Details Continuing to recommend walking be limited distances (<15'' due to use of compensatory gait pattern with increased fatigue) with RW and only when girlfriend is able to provide hands-on assistance.   Person(s) Educated Patient;Spouse   Methods Explanation;Demonstration   Comprehension Verbalized understanding;Returned demonstration          PT Short Term Goals - 01/22/15 1718    PT Short TERM GOAL #1   Title The patient will perform HEP with supervision from family for safety. Target Date July 12th.   Baseline Pt dependent for HEP.   Status On-going   PT Short TERM GOAL #2   Title The patient will move sit<>stand with SBA to demo improved functional independence. Target Date July 12th.   Baseline Pt requires Min A for sit<>stand.   Status On-going   PT Short TERM GOAL #3   Title The patient will ambulate with LRAD and mod A x 50 feet on indoor level surface. Target Date July 12th.    Baseline Achieved 01/22/15, as pt ambulated x60' with RW and min A.   Status Achieved   PT Short TERM GOAL #4   Title Pt will improve PASS score from 19/36 to  21/36 to demo improved functional mobility. Target Date July 12th.    Baseline PASS score: 19/36.   Status On-going           PT Long Term Goals - 01/15/15 1530    PT LONG TERM GOAL #1   Title Pt will verbalize understanding of stroke risk factors and warning signs. Target date: 03/02/15.   Baseline Pt requires total A to recall stroke risk factors and warning signs.   Status On-going   PT LONG TERM GOAL #2   Title pt will improve PASS score from 19/36 to 23/36 to demo improved functional mobility. Target date: 03/02/15.   Baseline PASS score: 19/36.   Status On-going   PT LONG TERM GOAL #3   Title The patient will ambulate over level surfaces with LRAD and CGA x 75 feet for improved household ambulation.Target date: 03/02/15.   Status On-going  Plan - 01/27/15 1734    Clinical Impression Statement Pt continues to demonstrate increased independence with functional ambulation. However, gait stability limited by decreased attention to LLE, pt tendency to compensate for L hip/knee flexion via R ankle plantarflexion and trunk extension. Continue per POC.   Pt will benefit from skilled therapeutic intervention in order to improve on the following deficits Abnormal gait;Decreased coordination;Decreased range of motion;Difficulty walking;Impaired tone;Decreased safety awareness;Impaired UE functional use;Decreased balance;Decreased knowledge of use of DME;Impaired flexibility;Decreased strength;Decreased mobility;Decreased cognition;Impaired sensation;Postural dysfunction   Rehab Potential Good   Clinical Impairments Affecting Rehab Potential Cognitive impairments, L inattention   PT Frequency 2x / week   PT Duration 4 weeks   PT Treatment/Interventions Passive range of motion;Patient/family education;Orthotic Fit/Training;Wheelchair mobility training;Manual techniques;Neuromuscular re-education;Balance training;Therapeutic exercise;Therapeutic activities;Functional mobility  training;DME Instruction;Gait training;Stair training;Electrical Stimulation;Biofeedback;ADLs/Self Care Home Management;Cognitive remediation   PT Next Visit Plan Assess ambulation with new custom AFO. Continue gait training. NMES for L ankle dorsiflexion/eversion   Consulted and Agree with Plan of Care Patient;Family member/caregiver   Family Member Consulted Girlfriend, Retail banker        Problem List Patient Active Problem List   Diagnosis Date Noted  . Gait disturbance, post-stroke 01/19/2015  . Atrial fibrillation 12/25/2014  . Hypertensive heart disease 12/15/2014  . Cardiomyopathy 12/08/2014  . Insomnia 12/07/2014  . Essential hypertension 12/07/2014  . Secondary cardiomyopathy 11/05/2014  . Malignant hypertension 11/05/2014  . Hyperlipidemia LDL goal <70 11/05/2014  . Cigarette smoker 11/05/2014  . Marijuana abuse 11/05/2014  . Hypokalemia 11/05/2014  . Left hemiparesis 11/05/2014  . Cognitive deficit, post-stroke 11/05/2014  . Left-sided weakness   . Acute right anterior cerebral artery (ACA) ischemic stroke 10/31/2014    Jorje Guild, PT, DPT [facility] [address] [place], [place], [address] Phone: [phone]   Fax:  [phone] 01/27/2015, 5:48 PM

## 2015-01-27 NOTE — Patient Instructions (Signed)
Coordination exercise left hand. While seated,  *work on Psychologist, educationaltossing tennis ball back and forth between hands *work on Radio producertossing ball in a higher arc *work on Radio producertossing ball to yourself with left hand  *turn tennis ball in a circle in left hand.  Complete 3-4 times/ daily for 5-10 min at a time

## 2015-01-27 NOTE — Therapy (Signed)
[facility] [address] [place], [place], [address] Phone: [phone]   Fax:  [phone]  Occupational Therapy Treatment  Patient Details  Name: [name] Short MRN: [id number] Date of Birth: [birth year] Referring Provider:  Dessa Phi, MD  Encounter Date: 01/27/2015      OT End of Session - 01/27/15 1351    Visit Number 2   Number of Visits 16   Authorization Type Medicaid   Authorization - Visit Number 2   Authorization - Number of Visits 16   OT Start Time 1230   OT Stop Time 1315   OT Time Calculation (min) 45 min   Activity Tolerance Patient tolerated treatment well   Behavior During Therapy [facility] for tasks assessed/performed      Past Medical History  Diagnosis Date  . Hypertension     a. Previously on meds but none in several years.  . Smoker     a. Previous tobacco - quit ~ 2013. Ongoing daily marijuana usage. Occasional cigar.  . Stroke     a. 10/2014 MRI Head: acute mod size nonhemorrhagic R ant cerebral artery distribution infarct involving the medial aspect of the right frontal and parietal lobe and right aspect of the corpus callosum w/ local mass effect-->TPA;  b. 10/2014 MRA: Abrupt cut off of the R ACA A2 segment;  b. 10/2014 Carotid U/S: 1-39% bilat ICA stenosis.  . Cardiomyopathy     a. 10/2014 Echo: EF 35-40%, diff HK, sev LVH, mod dil LA, PASP .    Past Surgical History  Procedure Laterality Date  . Tee without cardioversion N/A 11/03/2014    Procedure: TRANSESOPHAGEAL ECHOCARDIOGRAM (TEE);  Surgeon: Laurey Morale, MD;  Location: [facility];  Service: Cardiovascular;  Laterality: N/A;    There were no vitals filed for this visit.  Visit Diagnosis:  Hemiparesis affecting nondominant side as late effect of stroke  Cognitive deficit due to recent cerebrovascular accident  Unsteadiness on feet      Subjective Assessment - 01/27/15 1327    Subjective  I tied my right shoe by  myself this morning   Patient is accompained by: --  girlfriend   Pertinent History HTN   Currently in Pain? No/denies                      OT Treatments/Exercises (OP) - 01/27/15 0001    Transfers   Transfers Sit to Stand   Sit to Stand 5: Supervision  initial verbal cueing   Sit to Stand Details (indicate cue type and reason) cueing to flex sufficiently forward    Comments Patient with preference to lean weight to right lower extremity in standing.  With consistent cueing able to shift weight to midline   ADLs   Eating Now able to drink with left hand now.   Grooming Beginnig to complete hygiene in standing at sink   LB Dressing Still needing assistance to dress left lower extremity, limited strength to flex hip and knee to place into clothing / shoes   Bathing Once in the shower patient able to bathe himself while seated on shower bench   ADL Comments Patient is incorporating standing into daily living skills with decreased assistance   Neurological Re-education Exercises   Other Exercises 1 Worked with patient to improve muscle timing and sequencing with left hand.  Patient with decreased awareness, and decreased sensory awareness of left hand, resulting in imapaired coordination with more fine motor tasks.  Worked  to improve muscle grading in left hand.     Other Exercises 2 Neuromuscular reeducation to address midline control and activation of left lower extremity in standing.  Use of left hand in sustained tasks, while sustaining active control of left lower extremity.   Other Grasp and Release Exercises  Patient demonstrate adequate grasp for basic ADL, yet lacks timing for release of objects.  Addressed this via tossing, catching, dropping, etc - to offer visual and tactile feedback to task                OT Education - 01/27/15 1350    Education provided Yes   Education Details importance of attention to left UE/LE   Person(s) Educated Patient   Girlfriend   Methods Explanation;Demonstration;Verbal cues   Comprehension Verbalized understanding;Need further instruction          OT Short Term Goals - 01/27/15 1355    OT SHORT TERM GOAL #1   Title Patient will don pants with no more thanmin assist   Baseline Mod assist   Time 4   Period Weeks   Status On-going   OT SHORT TERM GOAL #2   Title Patient will don socks and shoes with set up assistance and increased time   Baseline mod assist   Time 4   Period Weeks   Status On-going   OT SHORT TERM GOAL #3   Title Patient will stand at sink to complete hygiene task with no grater than initial min assist and then close supervision   Baseline min to mod assistance   Time 4   Period Weeks   Status On-going   OT SHORT TERM GOAL #4   Title Patient will transfer to shower using tub transfer bench and close supervision   Baseline mod - minimal asistance   Time 4   Period Weeks   Status On-going           OT Long Term Goals - 01/27/15 1356    OT LONG TERM GOAL #1   Title Patient will dress himself with modified independence   Baseline mod   Time 8   Period Weeks   Status On-going   OT LONG TERM GOAL #2   Title Patient will toilet himself with supervision (includes transfers, hygiene and clothing management)   Baseline mod assist   Time 8   Period Weeks   Status On-going   OT LONG TERM GOAL #3   Title Patient will prepare simple meal for himself (cold) with supervision, incorporating standing in part   Baseline dependent   Time 8   Period Weeks   Status On-going   OT LONG TERM GOAL #4   Title Patient will shower himself with supervision(include transfer and bathing)   Baseline mod   Time 8   Period Weeks   Status On-going               Plan - 01/27/15 1352    Clinical Impression Statement Patient is showing improvement in attention, response time,and active control in left UE/LE during ADL, which is helpful in improving his active performance with  ADL.  Patient motivated for functional improvement,.   Pt will benefit from skilled therapeutic intervention in order to improve on the following deficits (Retired) Decreased balance;Decreased cognition;Decreased coordination;Decreased safety awareness;Impaired sensation;Impaired tone;Impaired UE functional use;Decreased strength;Impaired perceived functional ability;Impaired vision/preception;Decreased knowledge of use of DME;Decreased range of motion   Rehab Potential Good   OT Frequency 2x / week   OT  Duration 8 weeks   OT Treatment/Interventions Self-care/ADL training;Therapeutic exercise;Neuromuscular education;Functional Mobility Training;Passive range of motion;Therapeutic activities;Cognitive remediation/compensation;Visual/perceptual remediation/compensation;DME and/or AE instruction;Patient/family education;Balance training   Plan dynamic stand balance - use of left UE in standing, fine motor control, ADL   Consulted and Agree with Plan of Care Patient;Family member/caregiver   Family Member Consulted girlfriend        Problem List Patient Active Problem List   Diagnosis Date Noted  . Gait disturbance, post-stroke 01/19/2015  . Atrial fibrillation 12/25/2014  . Hypertensive heart disease 12/15/2014  . Cardiomyopathy 12/08/2014  . Insomnia 12/07/2014  . Essential hypertension 12/07/2014  . Secondary cardiomyopathy 11/05/2014  . Malignant hypertension 11/05/2014  . Hyperlipidemia LDL goal <70 11/05/2014  . Cigarette smoker 11/05/2014  . Marijuana abuse 11/05/2014  . Hypokalemia 11/05/2014  . Left hemiparesis 11/05/2014  . Cognitive deficit, post-stroke 11/05/2014  . Left-sided weakness   . Acute right anterior cerebral artery (ACA) ischemic stroke 10/31/2014    Collier SalinaGellert, Kristin M, OTR/L 01/27/2015, 1:57 PM  [facility] [address] [place], [place], [address] Phone: [phone]   Fax:  [phone]

## 2015-01-29 ENCOUNTER — Ambulatory Visit: Payer: Medicaid Other | Admitting: Physical Therapy

## 2015-01-29 ENCOUNTER — Encounter: Payer: Medicaid Other | Admitting: Occupational Therapy

## 2015-01-29 NOTE — Telephone Encounter (Signed)
Spoke with girlfriend of patient and she scheduled a 3 month follow up in the [facility] clinic.

## 2015-02-01 ENCOUNTER — Encounter: Payer: Self-pay | Admitting: Physician Assistant

## 2015-02-01 ENCOUNTER — Other Ambulatory Visit: Payer: Self-pay

## 2015-02-01 ENCOUNTER — Ambulatory Visit (HOSPITAL_COMMUNITY): Payer: Medicaid Other | Attending: Internal Medicine

## 2015-02-01 ENCOUNTER — Ambulatory Visit: Payer: Medicaid Other | Admitting: Physical Therapy

## 2015-02-01 DIAGNOSIS — I340 Nonrheumatic mitral (valve) insufficiency: Secondary | ICD-10-CM | POA: Insufficient documentation

## 2015-02-01 DIAGNOSIS — I517 Cardiomegaly: Secondary | ICD-10-CM | POA: Diagnosis not present

## 2015-02-01 DIAGNOSIS — I351 Nonrheumatic aortic (valve) insufficiency: Secondary | ICD-10-CM | POA: Insufficient documentation

## 2015-02-01 DIAGNOSIS — I4891 Unspecified atrial fibrillation: Secondary | ICD-10-CM

## 2015-02-01 DIAGNOSIS — I420 Dilated cardiomyopathy: Secondary | ICD-10-CM | POA: Diagnosis not present

## 2015-02-01 DIAGNOSIS — I358 Other nonrheumatic aortic valve disorders: Secondary | ICD-10-CM | POA: Insufficient documentation

## 2015-02-02 ENCOUNTER — Telehealth: Payer: Self-pay | Admitting: *Deleted

## 2015-02-02 NOTE — Telephone Encounter (Signed)
Pt got on the phone and gave me permission to s/w girlfriend [name]. Ascencion DikeSherivian made aware of echo results by phone with verbal understanding. I advised pt to sign a DPR at next OV so that we may s/w girlfriend. Pt saidf ok and thank you.

## 2015-02-04 ENCOUNTER — Encounter: Payer: Self-pay | Admitting: Neurology

## 2015-02-04 ENCOUNTER — Ambulatory Visit: Payer: Medicaid Other | Admitting: Physical Therapy

## 2015-02-04 ENCOUNTER — Encounter: Payer: Self-pay | Admitting: Occupational Therapy

## 2015-02-04 ENCOUNTER — Ambulatory Visit (INDEPENDENT_AMBULATORY_CARE_PROVIDER_SITE_OTHER): Payer: Medicaid Other | Admitting: Neurology

## 2015-02-04 ENCOUNTER — Ambulatory Visit: Payer: Medicaid Other | Admitting: Occupational Therapy

## 2015-02-04 VITALS — BP 155/95 | HR 84 | Ht 70.0 in | Wt 168.5 lb

## 2015-02-04 DIAGNOSIS — M6289 Other specified disorders of muscle: Secondary | ICD-10-CM

## 2015-02-04 DIAGNOSIS — I69359 Hemiplegia and hemiparesis following cerebral infarction affecting unspecified side: Secondary | ICD-10-CM

## 2015-02-04 DIAGNOSIS — I639 Cerebral infarction, unspecified: Secondary | ICD-10-CM | POA: Diagnosis not present

## 2015-02-04 DIAGNOSIS — R269 Unspecified abnormalities of gait and mobility: Principal | ICD-10-CM

## 2015-02-04 DIAGNOSIS — R531 Weakness: Secondary | ICD-10-CM

## 2015-02-04 DIAGNOSIS — I10 Essential (primary) hypertension: Secondary | ICD-10-CM | POA: Diagnosis not present

## 2015-02-04 DIAGNOSIS — I69998 Other sequelae following unspecified cerebrovascular disease: Secondary | ICD-10-CM | POA: Diagnosis not present

## 2015-02-04 DIAGNOSIS — I480 Paroxysmal atrial fibrillation: Secondary | ICD-10-CM | POA: Diagnosis not present

## 2015-02-04 DIAGNOSIS — R2681 Unsteadiness on feet: Secondary | ICD-10-CM

## 2015-02-04 DIAGNOSIS — I69319 Unspecified symptoms and signs involving cognitive functions following cerebral infarction: Secondary | ICD-10-CM

## 2015-02-04 DIAGNOSIS — I69398 Other sequelae of cerebral infarction: Secondary | ICD-10-CM

## 2015-02-04 MED ORDER — CITALOPRAM HYDROBROMIDE 20 MG PO TABS: 20.0000 mg | ORAL_TABLET | Freq: Every day | ORAL | Status: DC

## 2015-02-04 NOTE — Therapy (Signed)
[facility] [address] [place], [place], [address] Phone: [phone]   Fax:  [phone]  Occupational Therapy Treatment  Patient Details  Name: [name] Short MRN: [id number] Date of Birth: 09-03-1965 Referring Provider:  Dessa Phi, MD  Encounter Date: 02/04/2015      OT End of Session - 02/04/15 1541    Visit Number 3   Number of Visits 12   Authorization Type Medicaid   Authorization - Visit Number 3   Authorization - Number of Visits 12   OT Start Time 1102   OT Stop Time 1146   OT Time Calculation (min) 44 min   Activity Tolerance Patient tolerated treatment well   Behavior During Therapy [facility] for tasks assessed/performed      Past Medical History  Diagnosis Date  . Hypertension     a. Previously on meds but none in several years.  . Smoker     a. Previous tobacco - quit ~ 2013. Ongoing daily marijuana usage. Occasional cigar.  . Stroke     a. 10/2014 MRI Head: acute mod size nonhemorrhagic R ant cerebral artery distribution infarct involving the medial aspect of the right frontal and parietal lobe and right aspect of the corpus callosum w/ local mass effect-->TPA;  b. 10/2014 MRA: Abrupt cut off of the R ACA A2 segment;  b. 10/2014 Carotid U/S: 1-39% bilat ICA stenosis.  . Cardiomyopathy     a. 10/2014 Echo: EF 35-40%, diff HK, sev LVH, mod dil LA, PASP .;  b.  Echo 7/16: Severe LVH, EF 55-60%, aortic sclerosis without stenosis, mild AI, trivial MR, severe BAE    Past Surgical History  Procedure Laterality Date  . Tee without cardioversion N/A 11/03/2014    Procedure: TRANSESOPHAGEAL ECHOCARDIOGRAM (TEE);  Surgeon: Laurey Morale, MD;  Location: [facility] ENDOSCOPY;  Service: Cardiovascular;  Laterality: N/A;    There were no vitals filed for this visit.  Visit Diagnosis:  Hemiparesis affecting nondominant side as late effect of stroke  Cognitive deficit due to recent cerebrovascular  accident  Unsteadiness on feet      Subjective Assessment - 02/04/15 1525    Subjective  I feel when the door hit the walker.  I didn't actually fall, I just slid to the floor.   Patient is accompained by: Family member   Pertinent History HTN   Patient Stated Goals I was to use    Currently in Pain? No/denies   Multiple Pain Sites No                      OT Treatments/Exercises (OP) - 02/04/15 0001    ADLs   LB Dressing Worked in sitting on patient's ability to flex left hip, and knee.  Then addressed combination movements of left lower extremity as needed for lower body dressing - hip flexion with abduction/external rotation.  Patient now able to cross left foot over right foot to don lower body clothing.  Discussed with girlfriend that if feasible, when time was not such a factor to allow patient to attempt donning own pants.     Functional Mobility Patient now has AFO on left LE and significant improvement noted with functional ambulation, although needs cueing for safety with foot palcement - tends to drift toward left and kick rolling walker, and is very distractible in busy environment.  Patient needs cues also to attend to his path of travel versus his feet when walking.  Patient unable to motor  plan opening a closed door today while using a walker.  Responded well to instructional cues.                  OT Education - 02/04/15 1540    Education provided Yes   Education Details safety - walking with supervision, walking with shoes and brace on, donning shoes and brace - patient may need to purchase new wider shoes to fit over brace- girlfriend aware   Person(s) Educated Patient;Spouse   Methods Explanation;Demonstration   Comprehension Verbalized understanding;Returned demonstration;Verbal cues required          OT Short Term Goals - 02/04/15 1545    OT SHORT TERM GOAL #1   Title Patient will don pants with no more thanmin assist   Time 4   Period  Weeks   Status On-going   OT SHORT TERM GOAL #2   Title Patient will don socks and shoes with set up assistance and increased time   Baseline mod assist   Time 4   Status On-going   OT SHORT TERM GOAL #3   Title Patient will stand at sink to complete hygiene task with no grater than initial min assist and then close supervision   Status Achieved   OT SHORT TERM GOAL #4   Title Patient will transfer to shower using tub transfer bench and close supervision   Time 4   Period Weeks   Status On-going           OT Long Term Goals - 01/27/15 1356    OT LONG TERM GOAL #1   Title Patient will dress himself with modified independence   Baseline mod   Time 8   Period Weeks   Status On-going   OT LONG TERM GOAL #2   Title Patient will toilet himself with supervision (includes transfers, hygiene and clothing management)   Baseline mod assist   Time 8   Period Weeks   Status On-going   OT LONG TERM GOAL #3   Title Patient will prepare simple meal for himself (cold) with supervision, incorporating standing in part   Baseline dependent   Time 8   Period Weeks   Status On-going   OT LONG TERM GOAL #4   Title Patient will shower himself with supervision(include transfer and bathing)   Baseline mod   Time 8   Period Weeks   Status On-going               Plan - 02/04/15 1542    Clinical Impression Statement Patient is showing steady improvement in functional use of his left upper extremity as well as functional mobility.  Patient continues to require supervision for safety at home, yet 245 hour supervision is not available.  Discussed CAP worker program with girlfriend and patient.   Pt will benefit from skilled therapeutic intervention in order to improve on the following deficits (Retired) Decreased balance;Decreased cognition;Decreased coordination;Decreased safety awareness;Impaired sensation;Impaired tone;Impaired UE functional use;Decreased strength;Impaired perceived  functional ability;Impaired vision/preception;Decreased knowledge of use of DME;Decreased range of motion   Rehab Potential Good   OT Frequency 1x / week   OT Duration 12 weeks   OT Treatment/Interventions Self-care/ADL training;Therapeutic exercise;Neuromuscular education;Functional Mobility Training;Passive range of motion;Therapeutic activities;Cognitive remediation/compensation;Visual/perceptual remediation/compensation;DME and/or AE instruction;Patient/family education;Balance training   Plan functional use of hand - needs to attend to hand, functional mobility and SAFETY, ADL - lower body dressing   Consulted and Agree with Plan of Care Patient;Family member/caregiver   Family Member  Consulted girlfriend        Problem List Patient Active Problem List   Diagnosis Date Noted  . Gait disturbance, post-stroke 01/19/2015  . Atrial fibrillation 12/25/2014  . Hypertensive heart disease 12/15/2014  . Cardiomyopathy 12/08/2014  . Insomnia 12/07/2014  . Essential hypertension 12/07/2014  . Secondary cardiomyopathy 11/05/2014  . Malignant hypertension 11/05/2014  . Hyperlipidemia LDL goal <70 11/05/2014  . Cigarette smoker 11/05/2014  . Marijuana abuse 11/05/2014  . Hypokalemia 11/05/2014  . Left hemiparesis 11/05/2014  . Cognitive deficit, post-stroke 11/05/2014  . Left-sided weakness   . Acute right anterior cerebral artery (ACA) ischemic stroke 10/31/2014    Collier SalinaGellert, Kristin M, OTR/L 02/04/2015, 3:46 PM  [facility] [address] [place], [place], [address] Phone: [phone]   Fax:  [phone]

## 2015-02-04 NOTE — Progress Notes (Signed)
PATIENT: [name] Short DOB: 12-21-1965  Chief Complaint  Patient presents with  . Cerebrovascular Accident    MMSE - animals. He is here with his girlfriend, [name] Short.  He is following up from a stroke in April 2016 and is still in PT and OT.    HISTORICAL  [name] Short is a 49 years old right-handed male, accompanied by his girlfriend [name] Short, seen in  referred by primary care physician Dr. Armen Pickup for stroke  He had a sudden onset left leg weakness, altered mental status, presented to the emergency room, was noted blood pressure 220 out of 110, UDS was positive for marijuana He did receive IV TPA,  I have personally reviewed MRI of the brain, acute right ACA territory infarction, MRA of brain showed aplastic A1 segment of the left anterior cerebral artery with a 2 segment of the anterior cerebral artery supplied from the right bilaterally. Irregularity of the A2 segment of the anterior cerebral bilaterally with abrupt cut off of flow at the A2 segment of the right ACA 1.5 cm above its origin consistent with patient's acute right ACA distribution infarct.  Carotid Dopplers were no ICA stenosis.  Echocardiogram with ejection fraction 35-40% with diffuse hypokinesis.  Venous Doppler studies lower extremities negative for DVT.  TEE showed ejection fraction of 35% without embolus or thrombus  He was found to be in paroxysmal atrial fibrillation in Dec 16 2014, has been on Eliquis 5 mg twice a day   He used to work as a Investment Short, operational at [employer]. He now lives at home with his girlfriend, nonambulatory because of profound left leg weakness, need assistant in transferring himself, he denies significant swallowing difficulty, mild slurred speech, only mild left arm weakness   Reviewed laboratory evaluation, normal BMP, CBC with exception of mild anemia, hemoglobin 11.4, LDL 122, A1c 5.8, lupus anticoagulants was negative  REVIEW OF SYSTEMS: Full 14 system review of systems performed and notable  only for as above ALLERGIES: No Known Allergies  HOME MEDICATIONS: Current Outpatient Prescriptions  Medication Sig Dispense Refill  . amLODipine (NORVASC) 10 MG tablet Take 1 tablet (10 mg total) by mouth daily. 30 tablet 1  . apixaban (ELIQUIS) 5 MG TABS tablet Take 1 tablet (5 mg total) by mouth 2 (two) times daily.    Marland Kitchen atorvastatin (LIPITOR) 40 MG tablet Take 1 tablet (40 mg total) by mouth daily at 6 PM. 30 tablet 2  . carvedilol (COREG) 12.5 MG tablet Take 1 tablet (12.5 mg total) by mouth 2 (two) times daily with a meal. 60 tablet 2  . diclofenac sodium (VOLTAREN) 1 % GEL Apply 2 g topically 4 (four) times daily. 1 Tube 1  . lisinopril (PRINIVIL,ZESTRIL) 20 MG tablet Take 1 tablet (20 mg total) by mouth daily. 30 tablet 2  . methylphenidate (RITALIN) 10 MG tablet Take 1 tablet (10 mg total) by mouth 2 (two) times daily with breakfast and lunch. 60 tablet 0  . potassium chloride SA (K-DUR,KLOR-CON) 20 MEQ tablet Take 2 tablets (40 mEq total) by mouth daily. On first day, take 4 tablets (total dose = 80 mEq), thereafter take 2 tablets (40 mEq dose) daily. 186 tablet 1  . traZODone (DESYREL) 50 MG tablet Take 1 tablet (50 mg total) by mouth at bedtime as needed for sleep. 30 tablet 2   No current facility-administered medications for this visit.    PAST MEDICAL HISTORY: Past Medical History  Diagnosis Date  . Hypertension     a. Previously on  meds but none in several years.  . Smoker     a. Previous tobacco - quit ~ 2013. Ongoing daily marijuana usage. Occasional cigar.  . Stroke     a. 10/2014 MRI Head: acute mod size nonhemorrhagic R ant cerebral artery distribution infarct involving the medial aspect of the right frontal and parietal lobe and right aspect of the corpus callosum w/ local mass effect-->TPA;  b. 10/2014 MRA: Abrupt cut off of the R ACA A2 segment;  b. 10/2014 Carotid U/S: 1-39% bilat ICA stenosis.  . Cardiomyopathy     a. 10/2014 Echo: EF 35-40%, diff HK, sev LVH, mod  dil LA, PASP .;  b.  Echo 7/16: Severe LVH, EF 55-60%, aortic sclerosis without stenosis, mild AI, trivial MR, severe BAE    PAST SURGICAL HISTORY: Past Surgical History  Procedure Laterality Date  . Tee without cardioversion N/A 11/03/2014    Procedure: TRANSESOPHAGEAL ECHOCARDIOGRAM (TEE);  Surgeon: Laurey Morale, MD;  Location: [facility] ENDOSCOPY;  Service: Cardiovascular;  Laterality: N/A;    FAMILY HISTORY: Family History  Problem Relation Age of Onset  . Other      no premature cad  . Diabetes Mother   . Heart disease Father   . Heart attack Father   . Stroke Sister   . Hypertension Father   . Hypertension Brother   . Hypertension Sister     SOCIAL HISTORY:  History   Social History  . Marital Status: Single    Spouse Name: N/A  . Number of Children: 3  . Years of Education: 12   Occupational History  . Unemployed    Social History Main Topics  . Smoking status: Former Smoker    Types: Cigars    Quit date: 10/31/2014  . Smokeless tobacco: Not on file     Comment: previously smoked cigarettes - quit ~ 2013.  Marland Kitchen Alcohol Use: 0.0 oz/week    0 Standard drinks or equivalent per week     Comment: Alcohol use stopped in April 2016.  . Drug Use: Yes     Comment: Marijuana daily. Has not had marijuana since being in hospital.  . Sexual Activity: Not on file   Other Topics Concern  . Not on file   Social History Narrative   Lives in [place] with his GF.  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   Right-handed.   Occasional caffeine use.     PHYSICAL EXAM   Filed Vitals:   02/04/15 1310  BP: 155/95  Pulse: 84  Height: 5\' 10"  (1.778 m)  Weight: 168 lb 8 oz (76.431 kg)    Not recorded      Body mass index is 24.18 kg/(m^2).  PHYSICAL EXAMNIATION:  Gen: NAD, conversant, well nourised, obese, well groomed                     Cardiovascular: Regular rate rhythm, no peripheral edema, warm, nontender. Eyes: Conjunctivae clear without exudates or  hemorrhage Neck: Supple, no carotid bruise. Pulmonary: Clear to auscultation bilaterally   NEUROLOGICAL EXAM:  MENTAL STATUS: Speech:    Speech is normal; fluent and spontaneous with normal comprehension.  Cognition:    The patient is oriented to person, place, and time;     recent and remote memory intact;     language fluent;     normal attention, concentration,     fund of knowledge.  CRANIAL NERVES: CN II: Visual fields are full to confrontation. Fundoscopic exam is  normal, pupils were equal round reactive to light. CN III, IV, VI: extraocular movement are normal. No ptosis. CN V: Facial sensation is intact to pinprick in all 3 divisions bilaterally. Corneal responses are intact.  CN VII: Face is symmetric with normal eye closure and smile. CN VIII: Hearing is normal to rubbing fingers CN IX, X: Palate elevates symmetrically. Phonation is normal. CN XI: Head turning and shoulder shrug are intact CN XII: Tongue is midline with normal movements and no atrophy.  MOTOR: Normal bulk, mild left arm proximal distal weakness, 4/ 5, significant left leg weakness, trace left hip flexion, no significant distal left leg weakness   REFLEXES: Reflexes hyperreflexia on the left upper and lower extremity, plantar responses were mute on the left side.  SENSORY: Mildly decreased light touch pinprick at the left arm, leg  COORDINATION: Rapid alternating movements and fine finger movements are intact. There is no dysmetria on finger-to-nose and right heel to knee  GAIT/STANCE: Need assistant to get up from seated position, dragging his left leg, unsteady,   DIAGNOSTIC DATA (LABS, IMAGING, TESTING) - I reviewed patient records, labs, notes, testing and imaging myself where available.  Lab Results  Component Value Date   WBC 5.0 01/22/2015   HGB 11.4* 01/22/2015   HCT 35.2* 01/22/2015   MCV 89.3 01/22/2015   PLT 176.0 01/22/2015      Component Value Date/Time   NA 138 01/22/2015  1135   K 3.7 01/22/2015 1135   CL 106 01/22/2015 1135   CO2 25 01/22/2015 1135   GLUCOSE 66* 01/22/2015 1135   BUN 17 01/22/2015 1135   CREATININE 1.10 01/22/2015 1135   CALCIUM 9.2 01/22/2015 1135   PROT 8.6* 11/06/2014 0611   ALBUMIN 3.0* 11/06/2014 0611   AST 45* 11/06/2014 0611   ALT 19 11/06/2014 0611   ALKPHOS 64 11/06/2014 0611   BILITOT 1.1 11/06/2014 0611   GFRNONAA >60 12/01/2014 0600   GFRAA >60 12/01/2014 0600   Lab Results  Component Value Date   CHOL 164 11/01/2014   HDL 28* 11/01/2014   LDLCALC 122* 11/01/2014   TRIG 72 11/01/2014   CHOLHDL 5.9 11/01/2014   Lab Results  Component Value Date   HGBA1C 5.8* 11/01/2014   ASSESSMENT AND PLAN  Adalberto ColeCarl Oddo is a 49 y.o. male    1. Right ACA stroke, most likely due to paroxysmal atrial fibrillation, now is taking Eliquis 5 mg twice a day, significant left leg weakness, continue rehabilitation 2. Paroxysmal atrial fibrillation 3. Depression, starting Celexa 20 mg daily  Levert FeinsteinYijun Cinch Ormond, M.D. Ph.D.  [facility] [address], [address] [place], [place] [address] Ph: [phone] Fax: [phone]

## 2015-02-04 NOTE — Therapy (Signed)
[facility] [address] [place], [place], [address] Phone: [phone]   Fax:  [phone]  Physical Therapy Treatment  Patient Details  Name: [name] Short MRN: [id number] Date of Birth: 10/03/1965 Referring Provider:  Boykin Nearing, MD  Encounter Date: 02/04/2015      PT End of Session - 02/04/15 1710    Visit Number 6   Number of Visits 13   Date for PT Re-Evaluation 04/08/15   Authorization Type Medicaid   Authorization Time Period 6/22 - 03/09/15 currently approved  requesting through 04/08/15   PT Start Time 1032  Pt arrived late to session   PT Stop Time 1100   PT Time Calculation (min) 28 min   Equipment Utilized During Treatment Gait belt   Activity Tolerance Patient tolerated treatment well   Behavior During Therapy [facility] for tasks assessed/performed      Past Medical History  Diagnosis Date  . Hypertension     a. Previously on meds but none in several years.  . Smoker     a. Previous tobacco - quit ~ 2013. Ongoing daily marijuana usage. Occasional cigar.  . Stroke     a. 10/2014 MRI Head: acute mod size nonhemorrhagic R ant cerebral artery distribution infarct involving the medial aspect of the right frontal and parietal lobe and right aspect of the corpus callosum w/ local mass effect-->TPA;  b. 10/2014 MRA: Abrupt cut off of the R ACA A2 segment;  b. 10/2014 Carotid U/S: 1-39% bilat ICA stenosis.  . Cardiomyopathy     a. 10/2014 Echo: EF 35-40%, diff HK, sev LVH, mod dil LA, PASP 75mHg.;  b.  Echo 7/16: Severe LVH, EF 55-60%, aortic sclerosis without stenosis, mild AI, trivial MR, severe BAE    Past Surgical History  Procedure Laterality Date  . Tee without cardioversion N/A 11/03/2014    Procedure: TRANSESOPHAGEAL ECHOCARDIOGRAM (TEE);  Surgeon: DLarey Dresser MD;  Location: [facility]  Service: Cardiovascular;  Laterality: N/A;    There were no vitals filed for this visit.  Visit Diagnosis:   Abnormality of gait following cerebrovascular accident  Hemiparesis affecting nondominant side as late effect of stroke  Unsteadiness on feet      Subjective Assessment - 02/04/15 1700    Subjective Pt arrived to session wearing new custom AFO, which pt received from orthotist today. When asked, pt reporting having sustained a fall since last session. Pt stated, "my daughter was outside and I just went into the kitchen to get something. I hurt my (left) arm at first, but it's getting better now." Upon further questioning, learned that pt no longer has 24/7 supervision, as pt is now home with 163year old daughter only on week days while girlfriend is at work.   Patient is accompained by: Family member   Pertinent History HTN, A-Fib, cognitive impairments   Patient Stated Goals pt wishes to be able to walk again.    Currently in Pain? No/denies      Neuro Re-ed:    Postural Assessment Scale for Stroke Patients (PASS)  Give the subject instructions for each item as written below. When scoring the item, record the lowest response category that applies for each item.  Maintaining a Posture  _3_ 1. Sitting Without Support Instructions: Have the subject sit on a bench/mat without back support and with feet flat on the floor. (3) Can sit for 5 minutes without support (2) Can sit for more than 10 seconds without support (1) Can sit with  slight support (for example, by 1 hand) (0) Cannot sit  _3_ 2. Standing With Support Instructions: Have the subject stand, providing support as needed. Evaluate only the ability to stand with or without support. Do not consider the quality of the stance. (3) Can stand with support of only 1 hand (2) Can stand with moderate support of 1 person (1) Can stand with strong support of 2 people (0) Cannot stand, even with support  _2_ 3. Standing Without Support Instructions: Have the subject stand without support. Evaluate only the ability to stand with or  without support. Do not consider the quality of the stance. (3) Can stand without support for more than 1 minute and simultaneously perform arm movements at about shoulder level (2) Can stand without support for 1 minute or stands slightly asymmetrically  (1) Can stand without support for 10 seconds or leans heavily on 1 leg (0) Cannot stand without support  _1_ 4. Standing on Nonparetic Leg Instructions: Have the subject stand on the nonparetic leg. Evaluate only the ability to bear weight entirely on the nonparetic leg. Do not consider how the subject accomplishes the task. (3) Can stand on nonparetic leg for more than 10 seconds (2) Can stand on nonparetic leg for more than 5 seconds (1) Can stand on nonparetic leg for a few seconds (0) Cannot stand on nonparetic leg  _0_ 5. Standing on Paretic Leg Instructions: Have the subject stand on the paretic leg. Evaluate only the ability to bear weight entirely on the paretic leg. Do not consider how the subject accomplishes the task. (3) Can stand on paretic leg for more than 10 seconds (2) Can stand on paretic leg for more than 5 seconds (1) Can stand on paretic leg for a few seconds (0) Cannot stand on paretic leg  Maintaining Posture SUBTOTAL __9/15_____  Changing a Posture  _3_ 6. Supine to Paretic Side Lateral Instructions: Begin with the subject in supine on a treatment mat. Instruct the subject to roll to the paretic side (lateral movement). Assist as necessary. Evaluate the subject's performance on the amount of help required. Do not consider the quality of performance. (3) Can perform without help (2) Can perform with little help (1) Can perform with much help (0) Cannot perform  _2_ 7. Supine to Nonparetic Side Lateral Instructions: Begin with the subject in supine on a treatment mat. Instruct the subject to roll to the nonparetic side (lateral movement). Assist as necessary. Evaluate the subject's performance on the amount of  help required. Do not consider the quality of performance. (3) Can perform without help (2) Can perform with little help (1) Can perform with much help (0) Cannot perform  _2_ 8. Supine to Sitting Up on the Edge of the Mat Instructions: Begin with the subject in supine on a treatment mat. Instruct the subject to come to sitting on the edge of the mat. Assist as necessary. Evaluate the subject's performance on the amount of help required. Do not consider the quality of performance. (3) Can perform without help (2) Can perform with little help (1) Can perform with much help (0) Cannot perform  _1_ 9. Sitting on the Edge of the Mat to Supine Instructions: Begin with the on the edge of a treatment mat. Instruct the subject to return to supine. Assist as necessary. Evaluate the subject's performance on the amount of help required. Do not consider the quality of performance. (3) Can perform without help (2) Can perform with little help (1) Can perform  with much help (0) Cannot perform  _3_ 10. Sitting to Standing Up Instructions: Begin with the subject sitting on the edge of a treatment mat. Instruct the subject to stand up without support. Assist if necessary. Evaluate the subject's performance on the amount of help required. Do not consider the quality of performance. (3) Can perform without help (2) Can perform with little help (1) Can perform with much help (0) Cannot perform  _3_ 11. Standing Up to Sitting Down Instructions: Begin with the subject standing by edge of a treatment mat. Instruct the subject to sit on edge of mat without support. Assist if necessary. Evaluate the subject's performance on the amount of help required. Do not consider the quality of performance. (3) Can perform without help (2) Can perform with little help (1) Can perform with much help (0) Cannot perform  _2_ 12. Standing, Picking Up a Pencil from the Floor Instructions: Begin with the subject standing.  Instruct the subject to pick up a pencil fro the floor without support. Assist if necessary. Evaluate the subject's performance on the amount of help required. Do not consider the quality of performance. (3) Can perform without help (2) Can perform with little help (1) Can perform with much help (0) Cannot perform  Changing Posture SUBTOTAL __16/21___   TOTAL __25/36___                   [facility] Adult PT Treatment/Exercise - 02/04/15 0001    Bed Mobility   Bed Mobility Supine to Sit;Sit to Supine   Supine to Sit 4: Min guard  due to perceptual impairments   Supine to Sit Details (indicate cue type and reason) cueing to initiate movement of LUE across midline to initiate   Sit to Supine 3: Mod assist  for BLE management; pt wearing L AFO   Sit to Supine - Details (indicate cue type and reason) cueing for BLE management   Transfers   Transfers Sit to Stand;Stand to Sit   Sit to Stand 5: Supervision   Stand to Sit 5: Supervision   Ambulation/Gait   Ambulation/Gait Yes   Ambulation/Gait Assistance 4: Min guard;5: Supervision;4: Min assist   Ambulation/Gait Assistance Details cueing for wider BOS (due to LLE adduction), to decrease L hip internal rotation, for increased LLE stance stability, increased L hip/knee flexion   Ambulation Distance (Feet) 115 Feet   Assistive device Rolling walker  bariatric RW; L AFO   Gait Pattern Step-to pattern;Decreased hip/knee flexion - left;Decreased step length - left;Decreased trunk rotation;Trunk rotated posteriorly on left;Narrow base of support  LLE adduction; L hip internal rotation throughout   Ambulation Surface Level;Indoor                PT Education - 02/04/15 1702    Education provided Yes   Education Details Recommending that pt ambulate with supervision of girlfriend at all times. Discussed using wheelchair for all mobility and using urinal or bedside commode instead of walking to bathroom, when girlfriend is at  work.   Person(s) Educated Patient;Spouse   Methods Explanation   Comprehension Verbalized understanding          PT Short Term Goals - 02/04/15 1731    PT SHORT TERM GOAL #1   Title The patient will perform HEP with supervision from family for safety. Target Date July 12th.   Baseline Pt dependent for HEP.   Status Achieved   PT SHORT TERM GOAL #2   Title The patient will move sit<>stand  with SBA to demo improved functional independence. Target Date July 12th.   Baseline Pt requires Min A for sit<>stand.   Status Achieved   PT SHORT TERM GOAL #3   Title The patient will ambulate with LRAD and mod A x 50 feet on indoor level surface. Target Date July 12th.    Baseline Achieved 01/22/15, as pt ambulated x60' with RW and min A.   Status Achieved   PT SHORT TERM GOAL #4   Title Pt will improve PASS score from 19/36 to 21/36 to demo improved functional mobility. Target Date July 12th.    Baseline PASS score: 19/36.   Status Achieved  25/36 on 02/04/15           PT Long Term Goals - 02/04/15 1731    PT LONG TERM GOAL #1   Title Pt will verbalize understanding of stroke risk factors and warning signs. Modified target date: 03/19/15.   Baseline Pt requires total A to recall stroke risk factors and warning signs.   Status On-going   PT LONG TERM GOAL #2   Title pt will improve PASS score from 19/36 to 23/36 to demo improved functional mobility. Target date: 03/02/15.   Baseline PASS score: 19/36.   Status Achieved  25/36 on 02/04/15.   PT LONG TERM GOAL #3   Title The patient will ambulate over level surfaces with LRAD and CGA x 75 feet for improved household ambulation.Target date: 03/02/15   Status Achieved  Met 02/04/15.   PT LONG TERM GOAL #4   Title Pt will ambulate x200' over level, indoor surfaces with LRAD and mod I to indicate progress toward safety with household ambulation. Target date: 03/19/15.   Baseline 115' over level, indoor surfaces with close supervision during linear  gait, min guard during turns with RW and AFO.   Status New   PT LONG TERM GOAL #5   Title Pt will ambulate x300' over unlevel surfaces with LRAD and supervision to indicate ability to ambulate outside of home when accompanied by girlfriend. Target date: 03/19/15.   Baseline 115' over level, indoor surfaces with close supervision during linear gait, min guard during turns with RW and AFO.   Status New   Additional Long Term Goals   Additional Long Term Goals Yes   PT LONG TERM GOAL #6   Title Pt will negotiate standard curb with LRAD requiring CGA and 25% cueing to indicate ability to negotiate curbs in community. Target date: 03/19/15.   Baseline Negotiates curb step in w/c with total A of girlfriend.   Status New   PT LONG TERM GOAL #7   Title Patient will perform supine <> sit with mod I, increased time to both right and left sides to indicate increased safety/independence getting into/out of bed and on/off of couch. Target date: 03/19/15.   Baseline For supine > sit, pt required min A, 25% cueing due to perceputal impairments. For sit > supine, pt requires mod A for BLE management and 50% cueing for technique.   Status New               Plan - 02/04/15 1756    Clinical Impression Statement Pt has met 4 of 4 STG's and 2 of 3 LTG's, as pt has made marked improvements in stability/independence with all aspects of functional mobility since initial evaluation. After receiving custom AFO today, pt ambulated x115' over level, indoor surfaces with RW requiring close supervision to Min A. Pt/girlfriend have demonstrated excellent compliance with  HEP and consistent carryover of education provided during sessions. Pt will benefit from skilled outpatient PT once per week for 6 additional weeks to continue to progress functional mobility and decrease risk of falls. LTG's addded to address household and community ambulation, negotiation of community obstacles, and bed mobility.    Pt will benefit from  skilled therapeutic intervention in order to improve on the following deficits Abnormal gait;Decreased coordination;Decreased range of motion;Difficulty walking;Impaired tone;Decreased safety awareness;Impaired UE functional use;Decreased balance;Decreased knowledge of use of DME;Impaired flexibility;Decreased strength;Decreased mobility;Decreased cognition;Impaired sensation;Postural dysfunction;Decreased activity tolerance;Decreased endurance   Rehab Potential Good   Clinical Impairments Affecting Rehab Potential Cognitive impairments, L inattention   PT Frequency 1x / week   PT Duration 6 weeks   PT Treatment/Interventions Passive range of motion;Patient/family education;Orthotic Fit/Training;Wheelchair mobility training;Manual techniques;Neuromuscular re-education;Balance training;Therapeutic exercise;Therapeutic activities;Functional mobility training;DME Instruction;Gait training;Stair training;Electrical Stimulation;Biofeedback;ADLs/Self Care Home Management;Cognitive remediation   PT Next Visit Plan Continue to progress gait. Increase pt safety without hands-on assist/close supervision in home environment (obstacle negotiation, turning). Bed mobility. Community ambulation. Curb step negotiation.   Consulted and Agree with Plan of Care Patient;Family member/caregiver   Family Member Consulted Girlfriend, Education officer, environmental        Problem List Patient Active Problem List   Diagnosis Date Noted  . Gait disturbance, post-stroke 01/19/2015  . Atrial fibrillation 12/25/2014  . Hypertensive heart disease 12/15/2014  . Cardiomyopathy 12/08/2014  . Insomnia 12/07/2014  . Essential hypertension 12/07/2014  . Secondary cardiomyopathy 11/05/2014  . Malignant hypertension 11/05/2014  . Hyperlipidemia LDL goal <70 11/05/2014  . Cigarette smoker 11/05/2014  . Marijuana abuse 11/05/2014  . Hypokalemia 11/05/2014  . Left hemiparesis 11/05/2014  . Cognitive deficit, post-stroke 11/05/2014  . Left-sided  weakness   . Acute right anterior cerebral artery (ACA) ischemic stroke 10/31/2014    Billie Ruddy, PT, DPT [facility] [address] [place], [place], [address] Phone: [phone]   Fax:  [phone] 02/04/2015, 6:20 PM

## 2015-02-08 ENCOUNTER — Ambulatory Visit: Payer: Medicaid Other | Admitting: Physical Therapy

## 2015-02-08 ENCOUNTER — Encounter: Payer: Self-pay | Admitting: Occupational Therapy

## 2015-02-08 ENCOUNTER — Ambulatory Visit: Payer: Medicaid Other | Admitting: Occupational Therapy

## 2015-02-08 DIAGNOSIS — R2681 Unsteadiness on feet: Secondary | ICD-10-CM

## 2015-02-08 DIAGNOSIS — I69398 Other sequelae of cerebral infarction: Secondary | ICD-10-CM

## 2015-02-08 DIAGNOSIS — I69998 Other sequelae following unspecified cerebrovascular disease: Secondary | ICD-10-CM | POA: Diagnosis not present

## 2015-02-08 DIAGNOSIS — I69359 Hemiplegia and hemiparesis following cerebral infarction affecting unspecified side: Secondary | ICD-10-CM

## 2015-02-08 DIAGNOSIS — I69319 Unspecified symptoms and signs involving cognitive functions following cerebral infarction: Secondary | ICD-10-CM

## 2015-02-08 DIAGNOSIS — R269 Unspecified abnormalities of gait and mobility: Principal | ICD-10-CM

## 2015-02-08 NOTE — Therapy (Signed)
[facility] [address] [place], [place], [address] Phone: [phone]   Fax:  [phone]  Physical Therapy Treatment  Patient Details  Name: [name] Short MRN: [id number] Date of Birth: [birth year] Referring Provider:  Boykin Nearing, MD  Encounter Date: 02/08/2015      PT End of Session - 02/08/15 2039    Visit Number 7   Number of Visits 13   Date for PT Re-Evaluation 04/08/15   Authorization Type Medicaid   Authorization Time Period 6/22 - 03/09/15 currently approved  requesting through 04/08/15   PT Start Time 1025  Pt arrived late to session   PT Stop Time 1100   PT Time Calculation (min) 35 min   Equipment Utilized During Treatment Gait belt   Activity Tolerance Patient tolerated treatment well   Behavior During Therapy [facility] for tasks assessed/performed      Past Medical History  Diagnosis Date  . Hypertension     a. Previously on meds but none in several years.  . Smoker     a. Previous tobacco - quit ~ 2013. Ongoing daily marijuana usage. Occasional cigar.  . Stroke     a. 10/2014 MRI Head: acute mod size nonhemorrhagic R ant cerebral artery distribution infarct involving the medial aspect of the right frontal and parietal lobe and right aspect of the corpus callosum w/ local mass effect-->TPA;  b. 10/2014 MRA: Abrupt cut off of the R ACA A2 segment;  b. 10/2014 Carotid U/S: 1-39% bilat ICA stenosis.  . Cardiomyopathy     a. 10/2014 Echo: EF 35-40%, diff HK, sev LVH, mod dil LA, PASP 42mHg.;  b.  Echo 7/16: Severe LVH, EF 55-60%, aortic sclerosis without stenosis, mild AI, trivial MR, severe BAE    Past Surgical History  Procedure Laterality Date  . Tee without cardioversion N/A 11/03/2014    Procedure: TRANSESOPHAGEAL ECHOCARDIOGRAM (TEE);  Surgeon: DLarey Dresser MD;  [facility]  Service: Cardiovascular;  Laterality: N/A;    There were no vitals filed for this visit.  Visit Diagnosis:   Abnormality of gait following cerebrovascular accident  Hemiparesis affecting nondominant side as late effect of stroke  Unsteadiness on feet      Subjective Assessment - 02/08/15 2029    Subjective Pt states, "I need to work on getting up off a low couch. I also got to be better about making sure my legs is under me, that my left leg ain't out here." Pt[place]sp[place]. Per spouse, their goal is to "not bring the wheelchair" during trip (via airplane).   Patient is accompained by: Family member   Pertinent History HTN, A-Fib, cognitive impairments   Patient Stated Goals pt wishes to be able to walk again.    Currently in Pain? No/denies            [facility] - [facility]    Precautions   Precautions Fall   Required Braces or Orthoses Other Brace/Splint   Other Brace/Splint L AFO  custom                     [facility] Adult PT Treatment/Exercise - 02/08/15 2035    Transfers   Transfers Sit to Stand;Stand to Sit   Sit to Stand 5: Supervision   Sit to Stand Details (indicate cue type and reason) Blocked practicr of sit<>stand from low mat with focus on  awareness of LE positioning, setup, and technique (focus on BUE support and full anterior weight shift).   Stand to Sit 5: Supervision   Comments during sit <> stand, cueing for symmetrical UE use   Ambulation/Gait   Ambulation/Gait Yes   Ambulation/Gait Assistance 4: Min guard;5: Supervision;4: Min assist   Ambulation/Gait Assistance Details able to perform step-through pattern with cueing   Ambulation Distance (Feet) 325 Feet  x115' with [facility]   Assistive device Rolling walker;Large base quad cane  L AFO   Gait Pattern Decreased hip/knee flexion - left;Decreased step length - left;Decreased trunk rotation;Trunk rotated posteriorly on left;Narrow base of support;Step-through pattern;Step-to pattern  LLE adduction; L hip  internal rotation throughout   Ambulation Surface Level;Indoor   Ramp 4: Min assist   Ramp Details (indicate cue type and reason) descent:safe proximity from [place] 4: Min assist   Curb Details (indicate cue type and reason) cueing for sequencing   Gait Comments --                PT Education - 02/08/15 2032    Education provided Yes   Education Details Ramp and curb step negotiation with rolling walker. HEP: added standing L hip/knee flexion while wearing brace; see pt instructions.   Person(s) Educated Patient;Spouse   Methods Explanation;Demonstration;Handout   Comprehension Verbalized understanding;Returned demonstration          PT Short Term Goals - 02/04/15 1731    PT SHORT TERM GOAL #1   Title The patient will perform HEP with supervision from family for safety. Target Date July 12th.   Baseline Pt dependent for HEP.   Status Achieved   PT SHORT TERM GOAL #2   Title The patient will move sit<>stand with SBA to demo improved functional independence. Target Date July 12th.   Baseline Pt requires Min A for sit<>stand.   Status Achieved   PT SHORT TERM GOAL #3   Title The patient will ambulate with LRAD and mod A x 50 feet on indoor level surface. Target Date July 12th.    Baseline Achieved 01/22/15, as pt ambulated x60' with RW and min A.   Status Achieved   PT SHORT TERM GOAL #4   Title Pt will improve PASS score from 19/36 to 21/36 to demo improved functional mobility. Target Date July 12th.    Baseline PASS score: 19/36.   Status Achieved  25/36 on 02/04/15           PT Long Term Goals - 02/08/15 2046    PT LONG TERM GOAL #1   Title Pt will verbalize understanding of stroke risk factors and warning signs. Modified target date: 03/19/15.   Baseline Pt requires total A to recall stroke risk factors and warning signs.   Status On-going   PT LONG TERM GOAL #2   Title pt will improve PASS score from 19/36 to 23/36 to demo improved functional mobility.  Target date: 03/02/15.   Baseline PASS score: 19/36.   Status Achieved  25/36 on 02/04/15.   PT LONG TERM GOAL #3   Title The patient will ambulate over level surfaces with LRAD and CGA x 75 feet for improved household ambulation.Target date: 03/02/15   Status Achieved  Met 02/04/15.   PT LONG TERM GOAL #4   Title Pt will ambulate x200' over level, indoor surfaces with LRAD and mod I to indicate progress toward safety with household ambulation. Target date: 03/19/15.   Baseline 115' over level, indoor surfaces with  close supervision during linear gait, min guard during turns with RW and AFO.   Status New   PT LONG TERM GOAL #5   Title Pt will ambulate x300' over unlevel surfaces with LRAD and supervision to indicate ability to ambulate outside of home when accompanied by girlfriend. Target date: 03/19/15.   Baseline 115' over level, indoor surfaces with close supervision during linear gait, min guard during turns with RW and AFO.   Status On-going   PT LONG TERM GOAL #6   Title Pt will negotiate standard curb with LRAD requiring CGA and 25% cueing to indicate ability to negotiate curbs in community. Target date: 03/19/15.   Baseline Negotiates curb step in w/c with total A of girlfriend.   Status On-going   PT LONG TERM GOAL #7   Title Patient will perform supine <> sit with mod I, increased time to both right and left sides to indicate increased safety/independence getting into/out of bed and on/off of couch. Target date: 03/19/15.   Baseline For supine > sit, pt required min A, 25% cueing due to perceputal impairments. For sit > supine, pt requires mod A for BLE management and 50% cueing for technique.   Status On-going               Plan - 02/08/15 2042    Clinical Impression Statement Session focused on the folowing: negotiation of curb step and ramps with RW; gait training using LBQC; increasing pt awareness of UE/LE positioning with functional transfers. Pt continues to exhibit  progressive improvements in sustained attention, awareness, and independence with functional transfers and ambulation. Continue per POC.   Pt will benefit from skilled therapeutic intervention in order to improve on the following deficits Abnormal gait;Decreased coordination;Decreased range of motion;Difficulty walking;Impaired tone;Decreased safety awareness;Impaired UE functional use;Decreased balance;Decreased knowledge of use of DME;Impaired flexibility;Decreased strength;Decreased mobility;Decreased cognition;Impaired sensation;Postural dysfunction;Decreased activity tolerance;Decreased endurance   Rehab Potential Good   Clinical Impairments Affecting Rehab Potential Cognitive impairments, L inattention   PT Frequency 1x / week   PT Duration 6 weeks   PT Treatment/Interventions Passive range of motion;Patient/family education;Orthotic Fit/Training;Wheelchair mobility training;Manual techniques;Neuromuscular re-education;Balance training;Therapeutic exercise;Therapeutic activities;Functional mobility training;DME Instruction;Gait training;Stair training;Electrical Stimulation;Biofeedback;ADLs/Self Care Home Management;Cognitive remediation   PT Next Visit Plan Gait training (continue to progress to LB/SBQC). Increase pt safety without hands-on assist/close supervision in home environment (obstacle negotiation, turning). Bed mobility. Outdoor ambulation. Revisit ramp and curb step negotiation.   Consulted and Agree with Plan of Care Patient;Family member/caregiver   Family Member Consulted Girlfriend, Education officer, environmental        Problem List Patient Active Problem List   Diagnosis Date Noted  . Gait disturbance, post-stroke 01/19/2015  . Atrial fibrillation 12/25/2014  . Hypertensive heart disease 12/15/2014  . Cardiomyopathy 12/08/2014  . Insomnia 12/07/2014  . Essential hypertension 12/07/2014  . Secondary cardiomyopathy 11/05/2014  . Malignant hypertension 11/05/2014  . Hyperlipidemia LDL goal  <70 11/05/2014  . Cigarette smoker 11/05/2014  . Marijuana abuse 11/05/2014  . Hypokalemia 11/05/2014  . Left hemiparesis 11/05/2014  . Cognitive deficit, post-stroke 11/05/2014  . Left-sided weakness   . Acute right anterior cerebral artery (ACA) ischemic stroke 10/31/2014    Billie Ruddy, PT, DPT [facility] [address] [place], [place], [address] Phone: [phone]   Fax:  [phone] 02/08/2015, 8:51 PM

## 2015-02-08 NOTE — Patient Instructions (Signed)
"  I love a Copyarade" Lift   Stand with both hands at your kitchen countertop with your brace on. Lift your LEFT leg. Ascencion DikeSherivian, make sure he isn't compensating by lifting his pelvis or leaning head.shoulders backwards. Do 20 reps, 2-3 times per day.  http://gt2.exer.us/344   Copyright  VHI. All rights reserved.

## 2015-02-08 NOTE — Therapy (Signed)
[facility] [address] [place], [place], [address] Phone: [phone]   Fax:  [phone]  Occupational Therapy Treatment  Patient Details  Name: [name] ColeCarl Short MRN: [id number] Date of Birth: 09/03/1965 Referring Provider:  Dessa PhiFunches, Josalyn, MD  Encounter Date: 02/08/2015      OT End of Session - 02/08/15 1229    Visit Number 4   Number of Visits 12   Authorization Type Medicaid   Authorization - Visit Number 4   Authorization - Number of Visits 12   OT Start Time 1100   OT Stop Time 1145   OT Time Calculation (min) 45 min   Activity Tolerance Patient tolerated treatment well   Behavior [facility] Therapy [facility] for tasks assessed/performed      Past Medical History  Diagnosis Date  . Hypertension     a. Previously on meds but none in several years.  . Smoker     a. Previous tobacco - quit ~ 2013. Ongoing daily marijuana usage. Occasional cigar.  . Stroke     a. 10/2014 MRI Head: acute mod size nonhemorrhagic R ant cerebral artery distribution infarct involving the medial aspect of the right frontal and parietal lobe and right aspect of the corpus callosum w/ local mass effect-->TPA;  b. 10/2014 MRA: Abrupt cut off of the R ACA A2 segment;  b. 10/2014 Carotid U/S: 1-39% bilat ICA stenosis.  . Cardiomyopathy     a. 10/2014 Echo: EF 35-40%, diff HK, sev LVH, mod dil LA, PASP 45mmHg.;  b.  Echo 7/16: Severe LVH, EF 55-60%, aortic sclerosis without stenosis, mild AI, trivial MR, severe BAE    Past Surgical History  Procedure Laterality Date  . Tee without cardioversion N/A 11/03/2014    Procedure: TRANSESOPHAGEAL ECHOCARDIOGRAM (TEE);  Surgeon: Laurey Moralealton S McLean, MD;  [facility];  Service: Cardiovascular;  Laterality: N/A;    There were no vitals filed for this visit.  Visit Diagnosis:  Hemiparesis affecting nondominant side as late effect of stroke  Unsteadiness on feet  Cognitive deficit due to recent  cerebrovascular accident      Subjective Assessment - 02/08/15 1219    Subjective  I went to church on Sunday.  We didn't take my wheelchair.   Patient is accompained by: Family member   Pertinent History HTN   Patient Stated Goals Improve use of left hand, return to independence with ADL/IADL                      OT Treatments/Exercises (OP) - 02/08/15 0001    ADLs   Functional Mobility Today in Physical Therapy session, quad cane introduced.  Practiced in OT session, using a small based quad cane in functional situation.  Patient initially posturing with left hand, but when placed into functional settings, patient immediately incorporated left UE with only intermittent assistance.     Cooking Standing at counter in kitchen, to prepare simple snack.  Patient able to transport lightweight objects across the room using his left hand.  Patient able to open/close refrigerator door with left hand.  Patient able to reach to overhead shelf to obtain items with left hand in standing today.  Patient prepared a two step cold snack - pudding with mod cueing today.  Patient spotaneously using left hand appropriately during most of this task, yet needed moderate physical and verbal cueing to engage left leg in standing.  Patient with strong right sided bias, and stands with left knee and hip  flexed without reminders / facilitation.  Patient stood for 40 of this 45 min session.  Patient visibly fatigued at tend of session - girlfriend plans for patient to nap once home this afternoon.                  OT Education - 02/08/15 1227    Education provided Yes   Education Details Recommend that patient return to simple work in the kitchen, dishes, cold snacks, etc with supervision.  At this point do not recommend work on top of stove, or oven due to unpredictability of left UE/LE.  Left hand still fairly stimulus bound, and will reach and grasp items without purpose. Girlfriend well aware of  this behavior and in agreement.    Person(s) Educated Patient;Spouse   Methods Explanation;Demonstration;Tactile cues;Verbal cues   Comprehension Verbalized understanding;Verbal cues required          OT Short Term Goals - 02/08/15 1232    OT Short TERM GOAL #1   Title Patient will don pants with no more thanmin assist   Status On-going   OT Short TERM GOAL #2   Title Patient will don socks and shoes with set up assistance and increased time   Status On-going   OT Short TERM GOAL #3   Title Patient will stand at sink to complete hygiene task with no grater than initial min assist and then close supervision   Status Achieved   OT Short TERM GOAL #4   Title Patient will transfer to shower using tub transfer bench and close supervision   Status Achieved           OT Long Term Goals - 01/27/15 1356    OT LONG TERM GOAL #1   Title Patient will dress himself with modified independence   Baseline mod   Time 8   Period Weeks   Status On-going   OT LONG TERM GOAL #2   Title Patient will toilet himself with supervision (includes transfers, hygiene and clothing management)   Baseline mod assist   Time 8   Period Weeks   Status On-going   OT LONG TERM GOAL #3   Title Patient will prepare simple meal for himself (cold) with supervision, incorporating standing in part   Baseline dependent   Time 8   Period Weeks   Status On-going   OT LONG TERM GOAL #4   Title Patient will shower himself with supervision(include transfer and bathing)   Baseline mod   Time 8   Period Weeks   Status On-going               Plan - 02/08/15 1229    Clinical Impression Statement Patient continues to show steady improvement in OT due to improved functional mobility, control and strength in left LE, control in left UE, and improved initiation.     Pt will benefit from skilled therapeutic intervention in order to improve on the following deficits (Retired) Decreased balance;Decreased  cognition;Decreased coordination;Decreased safety awareness;Impaired sensation;Impaired tone;Impaired UE functional use;Decreased strength;Impaired perceived functional ability;Impaired vision/preception;Decreased knowledge of use of DME;Decreased range of motion   Rehab Potential Good   OT Frequency 1x / week  Requesting to decrease to 1x/week to extend time for rehab process   OT Duration 12 weeks   OT Treatment/Interventions Self-care/ADL training;Therapeutic exercise;Neuromuscular education;Functional Mobility Training;Passive range of motion;Therapeutic activities;Cognitive remediation/compensation;Visual/perceptual remediation/compensation;DME and/or AE instruction;Patient/family education;Balance training   Plan functional use of left hand, bilateral coordination, dynamic standing, safety, lower body dressing,  functional mobility   Consulted and Agree with Plan of Care Patient;Family member/caregiver   Family Member Consulted girlfriend- [name]        Problem List Patient Active Problem List   Diagnosis Date Noted  . Gait disturbance, post-stroke 01/19/2015  . Atrial fibrillation 12/25/2014  . Hypertensive heart disease 12/15/2014  . Cardiomyopathy 12/08/2014  . Insomnia 12/07/2014  . Essential hypertension 12/07/2014  . Secondary cardiomyopathy 11/05/2014  . Malignant hypertension 11/05/2014  . Hyperlipidemia LDL goal <70 11/05/2014  . Cigarette smoker 11/05/2014  . Marijuana abuse 11/05/2014  . Hypokalemia 11/05/2014  . Left hemiparesis 11/05/2014  . Cognitive deficit, post-stroke 11/05/2014  . Left-sided weakness   . Acute right anterior cerebral artery (ACA) ischemic stroke 10/31/2014    Collier Salina, OTR/L 02/08/2015, 12:33 PM  [facility] [address] [place], [place], [address] Phone: [phone]   Fax:  [phone]

## 2015-02-09 ENCOUNTER — Other Ambulatory Visit: Payer: Self-pay

## 2015-02-09 ENCOUNTER — Telehealth: Payer: Self-pay | Admitting: Physician Assistant

## 2015-02-09 DIAGNOSIS — Z8673 Personal history of transient ischemic attack (TIA), and cerebral infarction without residual deficits: Secondary | ICD-10-CM

## 2015-02-09 MED ORDER — APIXABAN 5 MG PO TABS: 5.0000 mg | ORAL_TABLET | Freq: Two times a day (BID) | ORAL | Status: DC

## 2015-02-09 NOTE — Telephone Encounter (Signed)
New Message  RX- [facility] calling to confirm prescription for pt- she was needing to know if the pt should be given 30 day or 15 day supply of Eliquis. Please call back and discuss.

## 2015-02-09 NOTE — Telephone Encounter (Signed)
I s/w pharmacist and verified Rx for Eliquis should be 30 day supply. new V/O given Eliquis 5 mg 1 tab BID # 60 x 6.

## 2015-02-10 ENCOUNTER — Ambulatory Visit: Payer: Medicaid Other | Admitting: Physical Therapy

## 2015-02-10 ENCOUNTER — Ambulatory Visit: Payer: Medicaid Other | Admitting: Occupational Therapy

## 2015-02-15 ENCOUNTER — Ambulatory Visit: Payer: Medicaid Other | Admitting: Occupational Therapy

## 2015-02-15 ENCOUNTER — Ambulatory Visit: Payer: Medicaid Other | Admitting: Physical Therapy

## 2015-02-15 VITALS — BP 126/81

## 2015-02-15 DIAGNOSIS — I69398 Other sequelae of cerebral infarction: Secondary | ICD-10-CM

## 2015-02-15 DIAGNOSIS — R279 Unspecified lack of coordination: Secondary | ICD-10-CM

## 2015-02-15 DIAGNOSIS — R278 Other lack of coordination: Secondary | ICD-10-CM

## 2015-02-15 DIAGNOSIS — R2681 Unsteadiness on feet: Secondary | ICD-10-CM

## 2015-02-15 DIAGNOSIS — IMO0002 Reserved for concepts with insufficient information to code with codable children: Secondary | ICD-10-CM

## 2015-02-15 DIAGNOSIS — R269 Unspecified abnormalities of gait and mobility: Principal | ICD-10-CM

## 2015-02-15 DIAGNOSIS — I69359 Hemiplegia and hemiparesis following cerebral infarction affecting unspecified side: Secondary | ICD-10-CM

## 2015-02-15 DIAGNOSIS — I69998 Other sequelae following unspecified cerebrovascular disease: Secondary | ICD-10-CM | POA: Diagnosis not present

## 2015-02-15 DIAGNOSIS — I69319 Unspecified symptoms and signs involving cognitive functions following cerebral infarction: Secondary | ICD-10-CM

## 2015-02-15 NOTE — Therapy (Signed)
[facility] [address] [place], [place], [address] Phone: [phone]   Fax:  [phone]  Physical Therapy Treatment  Patient Details  Name: [name] Short MRN: [id number] Date of Birth: August 24, 1965 Referring Provider:  Boykin Nearing, MD  Encounter Date: 02/15/2015      PT End of Session - 02/15/15 1754    Visit Number 8   Number of Visits 13   Date for PT Re-Evaluation 04/08/15   Authorization Type Medicaid   Authorization Time Period 6/22 - 03/09/15 currently approved  requesting through 04/08/15   PT Start Time 1027  Pt arrived late to session   PT Stop Time 1100   PT Time Calculation (min) 33 min   Equipment Utilized During Treatment Gait belt   Activity Tolerance Patient limited by fatigue;Patient tolerated treatment well  Frequent rest breaks required   Behavior During Therapy [facility] for tasks assessed/performed      Past Medical History  Diagnosis Date  . Hypertension     a. Previously on meds but none in several years.  . Smoker     a. Previous tobacco - quit ~ 2013. Ongoing daily marijuana usage. Occasional cigar.  . Stroke     a. 10/2014 MRI Head: acute mod size nonhemorrhagic R ant cerebral artery distribution infarct involving the medial aspect of the right frontal and parietal lobe and right aspect of the corpus callosum w/ local mass effect-->TPA;  b. 10/2014 MRA: Abrupt cut off of the R ACA A2 segment;  b. 10/2014 Carotid U/S: 1-39% bilat ICA stenosis.  . Cardiomyopathy     a. 10/2014 Echo: EF 35-40%, diff HK, sev LVH, mod dil LA, PASP 57mmHg.;  b.  Echo 7/16: Severe LVH, EF 55-60%, aortic sclerosis without stenosis, mild AI, trivial MR, severe BAE    Past Surgical History  Procedure Laterality Date  . Tee without cardioversion N/A 11/03/2014    Procedure: TRANSESOPHAGEAL ECHOCARDIOGRAM (TEE);  Surgeon: Larey Dresser, MD;  Location: [place];  Service: Cardiovascular;  Laterality: N/A;     There were no vitals filed for this visit.  Visit Diagnosis:  Abnormality of gait following cerebrovascular accident  Decreased coordination  Unsteadiness on feet      Subjective Assessment - 02/15/15 1742    Subjective Pt, girlfriend reporting no falls and no changes. Girlfriend reporting ongoing difficulty ensuring pt has supervision/assist when standing/walking. During this conversation, pt stated, "I know; I need to just stay put."   Patient is accompained by: Family member   Pertinent History HTN, A-Fib, cognitive impairments   Patient Stated Goals pt wishes to be able to walk again.    Currently in Pain? No/denies                         [facility] Adult PT Treatment/Exercise - 02/15/15 0001    Transfers   Transfers Sit to Stand;Stand to Sit   Sit to Stand 5: Supervision   Sit to Stand Details (indicate cue type and reason) subtle to min cueing for LLE positioning   Stand to Sit 5: Supervision   Comments --   Ambulation/Gait   Ambulation/Gait Yes   Ambulation/Gait Assistance 4: Min guard;5: Supervision;4: Min assist   Ambulation/Gait Assistance Details cueing for  wider BOS   Ambulation Distance (Feet) 545 Feet  x445 indoors (115' with QC; 230' with RW);100' outdoors - RW   Assistive device Rolling walker;Large base quad cane  L AFO   Gait Pattern  Decreased hip/knee flexion - left;Decreased step length - left;Decreased trunk rotation;Trunk rotated posteriorly on left;Narrow base of support;Step-through pattern  LLE adduction; L hip internal rotation throughout   Ambulation Surface Level;Indoor;Unlevel;Outdoor;Paved   Stairs --   Door Management 4: Min assist   Ramp 4: Min assist  with RW   Ramp Details (indicate cue type and reason) To ascend, pt requires min A, lateral weight shift to R, and cueing for increased L hip/knee flexion during LLE advancement; min guard during descent   Curb 4: Min assist  with RW   Curb Details (indicate cue type and  reason) x4 trials total with min guard-min a of girlfriend; initial trial with cueing for technique by this PT; subsequent trials with safe/appropriate cueing by girlfriend   Gait Comments Pt traversed obstacle course (focused on turning, negotiation of obstacles 3" apart, and 2-step technique for negotiating smaller obstacles (lifting/lowering front legs of walker over obstacle, taking step, then lifting back legs of walker). Negotiated 20' obstacle course x4 trials (seated rest break after 2 trials) with max cueing during initial trial, no cueing during final trial.                PT Education - 02/15/15 1743    Education provided Yes   Education Details Recommended that pt attempt community mobility (initially for short trip to/from therapies) using RW, leaving w/c in car. Obstacle negotiation using RW.   Person(s) Educated Patient;Spouse   Methods Explanation;Demonstration;Verbal cues   Comprehension Verbalized understanding;Returned demonstration          PT Short Term Goals - 02/04/15 1731    PT SHORT TERM GOAL #1   Title The patient will perform HEP with supervision from family for safety. Target Date July 12th.   Baseline Pt dependent for HEP.   Status Achieved   PT SHORT TERM GOAL #2   Title The patient will move sit<>stand with SBA to demo improved functional independence. Target Date July 12th.   Baseline Pt requires Min A for sit<>stand.   Status Achieved   PT SHORT TERM GOAL #3   Title The patient will ambulate with LRAD and mod A x 50 feet on indoor level surface. Target Date July 12th.    Baseline Achieved 01/22/15, as pt ambulated x60' with RW and min A.   Status Achieved   PT SHORT TERM GOAL #4   Title Pt will improve PASS score from 19/36 to 21/36 to demo improved functional mobility. Target Date July 12th.    Baseline PASS score: 19/36.   Status Achieved  25/36 on 02/04/15           PT Long Term Goals - 02/08/15 2046    PT LONG TERM GOAL #1   Title Pt  will verbalize understanding of stroke risk factors and warning signs. Modified target date: 03/19/15.   Baseline Pt requires total A to recall stroke risk factors and warning signs.   Status On-going   PT LONG TERM GOAL #2   Title pt will improve PASS score from 19/36 to 23/36 to demo improved functional mobility. Target date: 03/02/15.   Baseline PASS score: 19/36.   Status Achieved  25/36 on 02/04/15.   PT LONG TERM GOAL #3   Title The patient will ambulate over level surfaces with LRAD and CGA x 75 feet for improved household ambulation.Target date: 03/02/15   Status Achieved  Met 02/04/15.   PT LONG TERM GOAL #4   Title Pt will ambulate x200' over level,  indoor surfaces with LRAD and mod I to indicate progress toward safety with household ambulation. Target date: 03/19/15.   Baseline 115' over level, indoor surfaces with close supervision during linear gait, min guard during turns with RW and AFO.   Status New   PT LONG TERM GOAL #5   Title Pt will ambulate x300' over unlevel surfaces with LRAD and supervision to indicate ability to ambulate outside of home when accompanied by girlfriend. Target date: 03/19/15.   Baseline 115' over level, indoor surfaces with close supervision during linear gait, min guard during turns with RW and AFO.   Status On-going   PT LONG TERM GOAL #6   Title Pt will negotiate standard curb with LRAD requiring CGA and 25% cueing to indicate ability to negotiate curbs in community. Target date: 03/19/15.   Baseline Negotiates curb step in w/c with total A of girlfriend.   Status On-going   PT LONG TERM GOAL #7   Title Patient will perform supine <> sit with mod I, increased time to both right and left sides to indicate increased safety/independence getting into/out of bed and on/off of couch. Target date: 03/19/15.   Baseline For supine > sit, pt required min A, 25% cueing due to perceputal impairments. For sit > supine, pt requires mod A for BLE management and 50% cueing  for technique.   Status On-going               Plan - 02/15/15 1756    Clinical Impression Statement Focused on community mobility and obstacle negotiation with RW; and gait training with SBQC. Pt had less difficulty ambulating with SBQC as compared with LBQC. Pt will continue to benefit from skilled outpatient PT to increase stability/independence with functional mobility, decrease flall risk.   Pt will benefit from skilled therapeutic intervention in order to improve on the following deficits Abnormal gait;Decreased coordination;Decreased range of motion;Difficulty walking;Impaired tone;Decreased safety awareness;Impaired UE functional use;Decreased balance;Decreased knowledge of use of DME;Impaired flexibility;Decreased strength;Decreased mobility;Decreased cognition;Impaired sensation;Postural dysfunction;Decreased activity tolerance;Decreased endurance   Rehab Potential Good   Clinical Impairments Affecting Rehab Potential Cognitive impairments, L inattention   PT Frequency 1x / week   PT Duration 6 weeks  12 weeks total (from start of POC)   PT Treatment/Interventions Passive range of motion;Patient/family education;Orthotic Fit/Training;Wheelchair mobility training;Manual techniques;Neuromuscular re-education;Balance training;Therapeutic exercise;Therapeutic activities;Functional mobility training;DME Instruction;Gait training;Stair training;Electrical Stimulation;Biofeedback;ADLs/Self Care Home Management;Cognitive remediation   PT Next Visit Plan Gait training (continue to progress to [facility]). Increase pt safety with supervision in home environment (obstacle negotiation, turning). Bed mobility. Outdoor ambulation. Revisit ramp and curb step negotiation.   Consulted and Agree with Plan of Care Patient;Family member/caregiver   Family Member Consulted Girlfriend, Education officer, environmental        Problem List Patient Active Problem List   Diagnosis Date Noted  . Gait disturbance, post-stroke  01/19/2015  . Atrial fibrillation 12/25/2014  . Hypertensive heart disease 12/15/2014  . Cardiomyopathy 12/08/2014  . Insomnia 12/07/2014  . Essential hypertension 12/07/2014  . Secondary cardiomyopathy 11/05/2014  . Malignant hypertension 11/05/2014  . Hyperlipidemia LDL goal <70 11/05/2014  . Cigarette smoker 11/05/2014  . Marijuana abuse 11/05/2014  . Hypokalemia 11/05/2014  . Left hemiparesis 11/05/2014  . Cognitive deficit, post-stroke 11/05/2014  . Left-sided weakness   . Acute right anterior cerebral artery (ACA) ischemic stroke 10/31/2014    Billie Ruddy, PT, DPT [facility] [address] [place], [place], [address] Phone: [phone]   Fax:  [phone] 02/15/2015, 6:04  PM

## 2015-02-15 NOTE — Therapy (Signed)
[facility] [address] [place], [place], [address] Phone: [phone]   Fax:  [phone]  Occupational Therapy Treatment  Patient Details  Name: [name] Short MRN: [id number] Date of Birth: 12-02-1965 Referring Provider:  Dessa Phi, MD  Encounter Date: 02/15/2015      OT End of Session - 02/15/15 1259    Visit Number 5   Number of Visits 12   Authorization Type Medicaid   Authorization - Visit Number 4   Authorization - Number of Visits 12   OT Start Time 1106   OT Stop Time 1145   OT Time Calculation (min) 39 min   Activity Tolerance Patient tolerated treatment well   Behavior During Therapy [facility] for tasks assessed/performed      Past Medical History  Diagnosis Date  . Hypertension     a. Previously on meds but none in several years.  . Smoker     a. Previous tobacco - quit ~ 2013. Ongoing daily marijuana usage. Occasional cigar.  . Stroke     a. 10/2014 MRI Head: acute mod size nonhemorrhagic R ant cerebral artery distribution infarct involving the medial aspect of the right frontal and parietal lobe and right aspect of the corpus callosum w/ local mass effect-->TPA;  b. 10/2014 MRA: Abrupt cut off of the R ACA A2 segment;  b. 10/2014 Carotid U/S: 1-39% bilat ICA stenosis.  . Cardiomyopathy     a. 10/2014 Echo: EF 35-40%, diff HK, sev LVH, mod dil LA, PASP .;  b.  Echo 7/16: Severe LVH, EF 55-60%, aortic sclerosis without stenosis, mild AI, trivial MR, severe BAE    Past Surgical History  Procedure Laterality Date  . Tee without cardioversion N/A 11/03/2014    Procedure: TRANSESOPHAGEAL ECHOCARDIOGRAM (TEE);  Surgeon: Laurey Morale, MD;  Location: [facility] ENDOSCOPY;  Service: Cardiovascular;  Laterality: N/A;    Filed Vitals:   02/15/15 1141  BP: 126/81    Visit Diagnosis:  Weakness due to cerebrovascular accident  Decreased coordination  Cognitive deficit due to recent cerebrovascular  accident  Hemiparesis affecting nondominant side as late effect of stroke  Abnormality of gait following cerebrovascular accident      Subjective Assessment - 02/15/15 1107    Subjective  Pt denies pain   Patient is accompained by: Family member   Pertinent History HTN   Patient Stated Goals Improve use of left hand, return to independence with ADL/IADL      Treatment: Closed chain shoulder flexion seated withb ilateral UE's min v.c. For LUE position. Flipping playing cards with LUE for increased control/ functional use.  Standing to place various sized pegs in semi circle with LUE followed by reaching to place graded clothes pins on vertical antennae, mod v.c. for release and v.c. fo shift weight to LLE. Pt reported fatigue at end of session.                          OT Short Term Goals - 02/08/15 1232    OT SHORT TERM GOAL #1   Title Patient will don pants with no more thanmin assist   Status On-going   OT SHORT TERM GOAL #2   Title Patient will don socks and shoes with set up assistance and increased time   Status On-going   OT SHORT TERM GOAL #3   Title Patient will stand at sink to complete hygiene task with no grater than initial min assist and then  close supervision   Status Achieved   OT SHORT TERM GOAL #4   Title Patient will transfer to shower using tub transfer bench and close supervision   Status Achieved           OT Long Term Goals - 01/27/15 1356    OT LONG TERM GOAL #1   Title Patient will dress himself with modified independence   Baseline mod   Time 8   Period Weeks   Status On-going   OT LONG TERM GOAL #2   Title Patient will toilet himself with supervision (includes transfers, hygiene and clothing management)   Baseline mod assist   Time 8   Period Weeks   Status On-going   OT LONG TERM GOAL #3   Title Patient will prepare simple meal for himself (cold) with supervision, incorporating standing in part   Baseline dependent    Time 8   Period Weeks   Status On-going   OT LONG TERM GOAL #4   Title Patient will shower himself with supervision(include transfer and bathing)   Baseline mod   Time 8   Period Weeks   Status On-going               Plan - 02/15/15 1135    Clinical Impression Statement Pt is progressing towards goals for LUE functional use and standing balance.   Pt will benefit from skilled therapeutic intervention in order to improve on the following deficits (Retired) Decreased balance;Decreased cognition;Decreased coordination;Decreased safety awareness;Impaired sensation;Impaired tone;Impaired UE functional use;Decreased strength;Impaired perceived functional ability;Impaired vision/preception;Decreased knowledge of use of DME;Decreased range of motion   Rehab Potential Good   OT Frequency 1x / week   OT Duration 12 weeks   OT Treatment/Interventions Self-care/ADL training;Therapeutic exercise;Neuromuscular education;Functional Mobility Training;Passive range of motion;Therapeutic activities;Cognitive remediation/compensation;Visual/perceptual remediation/compensation;DME and/or AE instruction;Patient/family education;Balance training   Plan functional use of left hand, lower body dressing, standing balance   Consulted and Agree with Plan of Care Patient;Family member/caregiver   Family Member Consulted girlfriend- [name]        Problem List Patient Active Problem List   Diagnosis Date Noted  . Gait disturbance, post-stroke 01/19/2015  . Atrial fibrillation 12/25/2014  . Hypertensive heart disease 12/15/2014  . Cardiomyopathy 12/08/2014  . Insomnia 12/07/2014  . Essential hypertension 12/07/2014  . Secondary cardiomyopathy 11/05/2014  . Malignant hypertension 11/05/2014  . Hyperlipidemia LDL goal <70 11/05/2014  . Cigarette smoker 11/05/2014  . Marijuana abuse 11/05/2014  . Hypokalemia 11/05/2014  . Left hemiparesis 11/05/2014  . Cognitive deficit, post-stroke 11/05/2014   . Left-sided weakness   . Acute right anterior cerebral artery (ACA) ischemic stroke 10/31/2014    RINE,KATHRYN 02/15/2015, 1:07 PM Keene Breath, OTR/L [phone] Phone: [phone] 1:07 PM 02/15/2015 [facility] [address] [place], [place], [address] Phone: [phone]   Fax:  [phone]

## 2015-02-17 ENCOUNTER — Ambulatory Visit: Payer: Medicaid Other | Admitting: Physical Therapy

## 2015-02-17 ENCOUNTER — Encounter: Payer: Medicaid Other | Admitting: Occupational Therapy

## 2015-02-18 ENCOUNTER — Encounter: Payer: Self-pay | Admitting: Physical Medicine & Rehabilitation

## 2015-02-18 ENCOUNTER — Ambulatory Visit (HOSPITAL_BASED_OUTPATIENT_CLINIC_OR_DEPARTMENT_OTHER): Payer: Medicaid Other | Admitting: Physical Medicine & Rehabilitation

## 2015-02-18 ENCOUNTER — Encounter: Payer: Medicaid Other | Attending: Physical Medicine & Rehabilitation

## 2015-02-18 VITALS — BP 145/85 | HR 65 | Resp 16

## 2015-02-18 DIAGNOSIS — I6931 Cognitive deficits following cerebral infarction: Secondary | ICD-10-CM

## 2015-02-18 DIAGNOSIS — I63521 Cerebral infarction due to unspecified occlusion or stenosis of right anterior cerebral artery: Secondary | ICD-10-CM | POA: Diagnosis not present

## 2015-02-18 DIAGNOSIS — G8190 Hemiplegia, unspecified affecting unspecified side: Secondary | ICD-10-CM | POA: Insufficient documentation

## 2015-02-18 DIAGNOSIS — I69398 Other sequelae of cerebral infarction: Secondary | ICD-10-CM | POA: Diagnosis not present

## 2015-02-18 DIAGNOSIS — R269 Unspecified abnormalities of gait and mobility: Secondary | ICD-10-CM | POA: Diagnosis not present

## 2015-02-18 DIAGNOSIS — G8194 Hemiplegia, unspecified affecting left nondominant side: Secondary | ICD-10-CM

## 2015-02-18 DIAGNOSIS — I69319 Unspecified symptoms and signs involving cognitive functions following cerebral infarction: Secondary | ICD-10-CM

## 2015-02-18 MED ORDER — METHYLPHENIDATE HCL 10 MG PO TABS: 10.0000 mg | ORAL_TABLET | Freq: Two times a day (BID) | ORAL | Status: DC

## 2015-02-18 NOTE — Progress Notes (Signed)
Subjective:    Patient ID: [name] Short, male    DOB: [birth year], 49 y.o.   MRN: [id number] 49 y.o.right handed  male with history of hypertension as well as remote tobacco abuse. Patient on no scheduled medications upon admission. Patient independent prior to admission living with his girlfriend working at [employer]. Presented 10/31/2014 with left-sided weakness and altered mental status. Noted blood pressure 220/110. Urine drug screen positive for marijuana. MRI of the brain showed acute ischemic right ACA territory infarct. 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 Irregularity of the A2 segment of the anterior cerebral bilaterally with abrupt cut off of flow at the A2 segment of the right ACA 1.5 cm above its origin consistent with patient's acute right ACA distribution infarct. Carotid Dopplers were no ICA stenosis. Echocardiogram with ejection fraction 35-40% with diffuse hypokinesis. Venous Doppler studies lower extremities negative for DVT. Neurology consulted patient did  receive TPA. Presently maintained on aspirin/Plavix  HPI Stays at home during the day,  Uses walker at home for ambulation, girlfriend holds onto belt, minimal assistance, PT goals reviewed, all of supervision 200 feet long term by 8/9 OT notes reviewed, goals long-term of supervision, currently minimal assistance. Pain Inventory Average Pain 0 Pain Right Now 0 My pain is [phone]  In the last 24 hours, has pain interfered with the following? General activity 0 Relation with others 0 Enjoyment of life 0 What TIME of day is your pain at its worst? no pain Sleep (in general) NA  Pain is worse with: no pain Pain improves with: no pain Relief from Meds: no pain  Mobility walk with assistance use a walker ability to climb steps?  no do you drive?  no use a wheelchair needs help with  transfers  Function disabled: date disabled . I need assistance with the following:  dressing, bathing, toileting, meal prep, household duties and shopping  Neuro/Psych numbness trouble walking spasms  Prior Studies Any changes since last visit?  no  Physicians involved in your care Any changes since last visit?  no   Family History  Problem Relation Age of Onset  . Other      no premature cad  . Diabetes Mother   . Heart disease Father   . Heart attack Father   . Stroke Sister   . Hypertension Father   . Hypertension Brother   . Hypertension Sister    History   Social History  . Marital Status: Single    Spouse Name: N/A  . Number of Children: 3  . Years of Education: 12   Occupational History  . Unemployed    Social History Main Topics  . Smoking status: Former Smoker    Types: Cigars    Quit date: 10/31/2014  . Smokeless tobacco: Not on file     Comment: previously smoked cigarettes - quit ~ 2013.  Marland Kitchen Alcohol Use: 0.0 oz/week    0 Standard drinks or equivalent per week     Comment: Alcohol use stopped in April 2016.  . Drug Use: Yes     Comment: Marijuana daily. Has not had marijuana since being in hospital.  . Sexual Activity: Not on file   Other Topics Concern  . None   Social History Narrative   Lives in [place] with his GF.  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   Right-handed.  Occasional caffeine use.   Past Surgical History  Procedure Laterality Date  . Tee without cardioversion N/A 11/03/2014    Procedure: TRANSESOPHAGEAL ECHOCARDIOGRAM (TEE);  Surgeon: Laurey Morale, MD;  Location: [facility] ENDOSCOPY;  Service: Cardiovascular;  Laterality: N/A;   Past Medical History  Diagnosis Date  . Hypertension     a. Previously on meds but none in several years.  . Smoker     a. Previous tobacco - quit ~ 2013. Ongoing daily marijuana usage. Occasional cigar.  . Stroke     a. 10/2014 MRI Head: acute mod size nonhemorrhagic R ant cerebral artery  distribution infarct involving the medial aspect of the right frontal and parietal lobe and right aspect of the corpus callosum w/ local mass effect-->TPA;  b. 10/2014 MRA: Abrupt cut off of the R ACA A2 segment;  b. 10/2014 Carotid U/S: 1-39% bilat ICA stenosis.  . Cardiomyopathy     a. 10/2014 Echo: EF 35-40%, diff HK, sev LVH, mod dil LA, PASP .;  b.  Echo 7/16: Severe LVH, EF 55-60%, aortic sclerosis without stenosis, mild AI, trivial MR, severe BAE   BP 145/85 mmHg  Pulse 65  Resp 16  SpO2 96%  Opioid Risk Score:   Fall Risk Score:  `1  Depression screen PHQ 2/9  Depression screen [facility] 2/9 02/18/2015 12/25/2014 12/24/2014 12/07/2014  Decreased Interest 0 0 0 3  Down, Depressed, Hopeless 0 0 0 0  PHQ - 2 Score 0 0 0 3  Altered sleeping - 0 - 3  Tired, decreased energy - 0 - 3  Change in appetite - 0 - 0  Feeling bad or failure about yourself  - 0 - 3  Trouble concentrating - 0 - 3  Moving slowly or fidgety/restless - 0 - 0  Suicidal thoughts - 0 - 0  PHQ-9 Score - 0 - 15     Review of Systems  Musculoskeletal: Positive for gait problem.       Spasms  Neurological: Positive for numbness.  All other systems reviewed and are negative.      Objective:   Physical Exam  Constitutional: He appears well-developed and well-nourished.  HENT:  Head: Normocephalic and atraumatic.  Eyes: Conjunctivae and EOM are normal. Pupils are equal, round, and reactive to light.  Neck: Normal range of motion.  Neurological: He is alert. He exhibits abnormal muscle tone.  Psychiatric: Thought content normal. His affect is blunt. His speech is delayed. He is slowed. Cognition and memory are impaired. He does not express impulsivity.  Nursing note and vitals reviewed.   Hamstring weakness 2 minus on the left, quad is 4 minus on the left, hip flexor is 3 minus on the left, 0 at the ankle dorsiflexor 4/5 at the left deltoid, biceps, triceps, grip      Assessment & Plan:  1. Right anterior  cerebral artery infarct with left hemiparesis, his weakness is now mainly confined to his left lower extremity. He does have a AFO now which is assisting with ambulation. Using a walker at home and a quad cane in therapy He still needs assistance for ADLs Recommend continue outpatient PT and OT.  2. Cognitive deficits after stroke ACA infarct with typical findings of abulia, decreased concentration. No signs of excessive impulsivity. Continue methylphenidate 10 mg twice a day. Reevaluate in 1 month

## 2015-02-22 ENCOUNTER — Other Ambulatory Visit: Payer: Self-pay | Admitting: Family Medicine

## 2015-02-24 ENCOUNTER — Ambulatory Visit: Payer: Medicaid Other | Attending: Physical Medicine & Rehabilitation | Admitting: Occupational Therapy

## 2015-02-24 ENCOUNTER — Ambulatory Visit: Payer: Medicaid Other | Admitting: Physical Therapy

## 2015-02-24 DIAGNOSIS — R269 Unspecified abnormalities of gait and mobility: Secondary | ICD-10-CM | POA: Diagnosis present

## 2015-02-24 DIAGNOSIS — I69898 Other sequelae of other cerebrovascular disease: Secondary | ICD-10-CM | POA: Diagnosis not present

## 2015-02-24 DIAGNOSIS — I69859 Hemiplegia and hemiparesis following other cerebrovascular disease affecting unspecified side: Secondary | ICD-10-CM | POA: Insufficient documentation

## 2015-02-24 DIAGNOSIS — IMO0002 Reserved for concepts with insufficient information to code with codable children: Secondary | ICD-10-CM

## 2015-02-24 DIAGNOSIS — R2681 Unsteadiness on feet: Secondary | ICD-10-CM | POA: Diagnosis present

## 2015-02-24 DIAGNOSIS — I69398 Other sequelae of cerebral infarction: Secondary | ICD-10-CM

## 2015-02-24 DIAGNOSIS — R531 Weakness: Secondary | ICD-10-CM | POA: Diagnosis present

## 2015-02-24 DIAGNOSIS — R279 Unspecified lack of coordination: Secondary | ICD-10-CM | POA: Insufficient documentation

## 2015-02-24 DIAGNOSIS — R278 Other lack of coordination: Secondary | ICD-10-CM

## 2015-02-24 DIAGNOSIS — I69359 Hemiplegia and hemiparesis following cerebral infarction affecting unspecified side: Secondary | ICD-10-CM

## 2015-02-24 DIAGNOSIS — I6981 Cognitive deficits following other cerebrovascular disease: Secondary | ICD-10-CM | POA: Insufficient documentation

## 2015-02-24 DIAGNOSIS — I69998 Other sequelae following unspecified cerebrovascular disease: Secondary | ICD-10-CM | POA: Insufficient documentation

## 2015-02-24 DIAGNOSIS — I69319 Unspecified symptoms and signs involving cognitive functions following cerebral infarction: Secondary | ICD-10-CM

## 2015-02-24 NOTE — Patient Instructions (Signed)
    [name] Short, stand with both hands on your walker. Make sure your left ankle brace is on. Loop the YELLOW Theraband just above your LEFT knee. Anchor the other side of the  Band to a stable surface. [name] Short, stand on [name] Short's left side and place hands on to ensure stability. [name] Short, step forward with your left leg to hit a target on the floor. Then, step backwards and repeat. Think about making your knee high. Do this 20 times, then change the Theraband position to [name] Short's left ankle. Repeat the forward/backward stepping 20 times.  Do this at least twice per day.

## 2015-02-24 NOTE — Therapy (Addendum)
[facility] [address] [place], [place], [address] Phone: [phone]   Fax:  [phone]  Occupational Therapy Treatment  Patient Details  Name: [name] Short MRN: [id number] Date of Birth: 1965-11-30 Referring Provider:  Dessa Phi, MD  Encounter Date: 02/24/2015      OT End of Session - 02/24/15 1743    Visit Number 6   Number of Visits 12   Authorization Type Medicaid   Authorization - Visit Number 5   Authorization - Number of Visits 12   OT Start Time 1020   OT Stop Time 1100   OT Time Calculation (min) 40 min   Activity Tolerance Patient tolerated treatment well      Past Medical History  Diagnosis Date  . Hypertension     a. Previously on meds but none in several years.  . Smoker     a. Previous tobacco - quit ~ 2013. Ongoing daily marijuana usage. Occasional cigar.  . Stroke     a. 10/2014 MRI Head: acute mod size nonhemorrhagic R ant cerebral artery distribution infarct involving the medial aspect of the right frontal and parietal lobe and right aspect of the corpus callosum w/ local mass effect-->TPA;  b. 10/2014 MRA: Abrupt cut off of the R ACA A2 segment;  b. 10/2014 Carotid U/S: 1-39% bilat ICA stenosis.  . Cardiomyopathy     a. 10/2014 Echo: EF 35-40%, diff HK, sev LVH, mod dil LA, PASP .;  b.  Echo 7/16: Severe LVH, EF 55-60%, aortic sclerosis without stenosis, mild AI, trivial MR, severe BAE    Past Surgical History  Procedure Laterality Date  . Tee without cardioversion N/A 11/03/2014    Procedure: TRANSESOPHAGEAL ECHOCARDIOGRAM (TEE);  Surgeon: Laurey Morale, MD;  Location: [facility] ENDOSCOPY;  Service: Cardiovascular;  Laterality: N/A;    There were no vitals filed for this visit.  Visit Diagnosis:  Weakness due to cerebrovascular accident  Decreased coordination  Cognitive deficit due to recent cerebrovascular accident  Abnormality of gait following cerebrovascular accident       Subjective Assessment - 02/24/15 1104    Subjective  My hand stiffens up   Patient is accompained by: Family member   Pertinent History HTN   Patient Stated Goals Improve use of left hand, return to independence with ADL/IADL   Currently in Pain? Yes   Pain Score 4    Pain Location Knee   Pain Orientation Right   Pain Descriptors / Indicators Sore   Pain Onset In the past 7 days   Pain Frequency Intermittent   Aggravating Factors  moving/ walking   Pain Relieving Factors rest, pain pills   Multiple Pain Sites No                      OT Treatments/Exercises (OP) - 02/24/15 0001    ADLs   LB Dressing Pt practiced donning doffing soocks and shoes and AFO, min v.c. and min A for brace. Simulated donning doffing pants sit-stand, min A.    Cooking Standing in kitchen to make a cold sandwich. Pt retrieved items and performed with min A/ min v.c. in standing with walker.     Treatment:Theraputic activities, standing for dynamic functional reaching with LUE to target, min v.c./ facilitation             OT Short Term Goals - 02/24/15 1023    OT SHORT TERM GOAL #1   Title (p) Patient will don pants with  no more than  min assist   Baseline (p) 02/24/15- not consistent, min-mod A   Status (p) On-going   OT SHORT TERM GOAL #2   Title (p) Patient will don socks and shoes with set up assistance and increased time   Baseline (p) 02/24/15- min A for brace   Status (p) On-going   OT SHORT TERM GOAL #3   Title (p) Patient will stand at sink to complete hygiene task with no grater than initial min assist and then close supervision   Status (p) Achieved   OT SHORT TERM GOAL #4   Title (p) Patient will transfer to shower using tub transfer bench and close supervision   Status (p) Achieved           OT Long Term Goals - 01/27/15 1356    OT LONG TERM GOAL #1   Title Patient will dress himself with modified independence   Baseline mod   Time 8   Period Weeks    Status On-going   OT LONG TERM GOAL #2   Title Patient will toilet himself with supervision (includes transfers, hygiene and clothing management)   Baseline mod assist   Time 8   Period Weeks   Status On-going   OT LONG TERM GOAL #3   Title Patient will prepare simple meal for himself (cold) with supervision, incorporating standing in part   Baseline dependent   Time 8   Period Weeks   Status On-going   OT LONG TERM GOAL #4   Title Patient will shower himself with supervision(include transfer and bathing)   Baseline mod   Time 8   Period Weeks   Status On-going               Plan - 02/24/15 1240    Clinical Impression Statement Pt is progressing towards goals for ADLS/ IADLS and standing balance   Pt will benefit from skilled therapeutic intervention in order to improve on the following deficits (Retired) Decreased balance;Decreased cognition;Decreased coordination;Decreased safety awareness;Impaired sensation;Impaired tone;Impaired UE functional use;Decreased strength;Impaired perceived functional ability;Impaired vision/preception;Decreased knowledge of use of DME;Decreased range of motion   Rehab Potential Good   OT Frequency 1x / week   OT Duration 12 weeks   OT Treatment/Interventions Self-care/ADL training;Therapeutic exercise;Neuromuscular education;Functional Mobility Training;Passive range of motion;Therapeutic activities;Cognitive remediation/compensation;Visual/perceptual remediation/compensation;DME and/or AE instruction;Patient/family education;Balance training   Plan reinforce lower body dressing, LUE functional use   Consulted and Agree with Plan of Care Patient;Family member/caregiver   Family Member Consulted girlfriend- [name]        Problem List Patient Active Problem List   Diagnosis Date Noted  . Gait disturbance, post-stroke 01/19/2015  . Atrial fibrillation 12/25/2014  . Hypertensive heart disease 12/15/2014  . Cardiomyopathy 12/08/2014  .  Insomnia 12/07/2014  . Essential hypertension 12/07/2014  . Secondary cardiomyopathy 11/05/2014  . Malignant hypertension 11/05/2014  . Hyperlipidemia LDL goal <70 11/05/2014  . Cigarette smoker 11/05/2014  . Marijuana abuse 11/05/2014  . Hypokalemia 11/05/2014  . Left hemiparesis 11/05/2014  . Cognitive deficit, post-stroke 11/05/2014  . Left-sided weakness   . Acute right anterior cerebral artery (ACA) ischemic stroke 10/31/2014    RINE,KATHRYN 02/24/2015, 5:49 PM Keene Breath, OTR/L [phone] Phone: [phone] 5:49 PM 02/24/2015 [facility] [address] [place], [place], [address] Phone: [phone]   Fax:  [phone]

## 2015-02-24 NOTE — Therapy (Signed)
[facility] [address] [place], [place], [address] Phone: [phone]   Fax:  [phone]  Physical Therapy Treatment  Patient Details  Name: [name] Short MRN: [id number] Date of Birth: 05/17/1966 Referring Provider:  Boykin Nearing, MD  Encounter Date: 02/24/2015      PT End of Session - 02/24/15 1914    Visit Number 9   Number of Visits 13   Date for PT Re-Evaluation 04/08/15   Authorization Type Medicaid   Authorization Time Period 7/20 - 03/30/15   PT Start Time 0930   PT Stop Time 1015   PT Time Calculation (min) 45 min   Equipment Utilized During Treatment Gait belt   Activity Tolerance Patient tolerated treatment well   Behavior During Therapy [facility] for tasks assessed/performed      Past Medical History  Diagnosis Date  . Hypertension     a. Previously on meds but none in several years.  . Smoker     a. Previous tobacco - quit ~ 2013. Ongoing daily marijuana usage. Occasional cigar.  . Stroke     a. 10/2014 MRI Head: acute mod size nonhemorrhagic R ant cerebral artery distribution infarct involving the medial aspect of the right frontal and parietal lobe and right aspect of the corpus callosum w/ local mass effect-->TPA;  b. 10/2014 MRA: Abrupt cut off of the R ACA A2 segment;  b. 10/2014 Carotid U/S: 1-39% bilat ICA stenosis.  . Cardiomyopathy     a. 10/2014 Echo: EF 35-40%, diff HK, sev LVH, mod dil LA, PASP 13mHg.;  b.  Echo 7/16: Severe LVH, EF 55-60%, aortic sclerosis without stenosis, mild AI, trivial MR, severe BAE    Past Surgical History  Procedure Laterality Date  . Tee without cardioversion N/A 11/03/2014    Procedure: TRANSESOPHAGEAL ECHOCARDIOGRAM (TEE);  Surgeon: DLarey Dresser MD;  [place]  Service: Cardiovascular;  Laterality: N/A;    There were no vitals filed for this visit.  Visit Diagnosis:  Abnormality of gait following cerebrovascular accident  Unsteadiness on  feet  Hemiparesis affecting nondominant side as late effect of stroke      Subjective Assessment - 02/24/15 0933    Subjective Pt ambulated into clinic using RW. Girlfriend states, "He's been walking with the walker. Not very far, but he's doing it."    Pt states,"I'm still having some trouble getting up out of low seats. Especially couches."   Pt /girlfriend report no falls, no changes.   Patient is accompained by: Family member   Pertinent History HTN, A-Fib, cognitive impairments   Patient Stated Goals pt wishes to be able to walk again.    Currently in Pain? Yes   Pain Score 4    Pain Location Knee   Pain Orientation Right;Anterior   Pain Descriptors / Indicators Sore   Pain Onset In the past 7 days   Pain Frequency Intermittent   Aggravating Factors  moving, walking   Pain Relieving Factors "Some of that creme; and some pain pills" and rest, per pt   Effect of Pain on Daily Activities "not really"   Multiple Pain Sites No                         [facility] Adult PT Treatment/Exercise - 02/24/15 0001    Transfers   Transfers Sit to Stand;Stand to Sit  blocked practice with/without UE use   Sit to Stand 5: Supervision   Sit to Stand  Details (indicate cue type and reason) Verbal, demonstration cueing initially required for setup, body mechanics, attention to LLE positioning, posture, and full anterior weight shift. Pt with effective within-session carryover with subtle to min cueing for remainder of session.   Stand to Sit 5: Supervision   Ambulation/Gait   Ambulation/Gait Yes   Ambulation/Gait Assistance 4: Min guard;5: Supervision;4: Min assist   Ambulation/Gait Assistance Details Supervision to min guard with RW; min guard to min A with SBQC   Ambulation Distance (Feet) 350 Feet  x445 indoors (115' with QC; 230' with RW);100' outdoors - RW   Assistive device Rolling walker;Small based quad cane  L AFO   Gait Pattern Decreased hip/knee flexion - left;Decreased  step length - left;Decreased trunk rotation;Trunk rotated posteriorly on left;Narrow base of support;Step-through pattern  LLE adduction; L hip internal rotation   Ambulation Surface Level;Indoor   Door Management --   Ramp 5: Supervision;4: Min assist  supervision with RW; Min A with SBQC   Curb 4: Min assist  with [facility]   Gait Comments Pt traversed obstacle course focused on turning, negotiation of obstacles while ambulating with SBQC. Required cueing for attention to LE, as pt exhibited decreased LLE clearance while dual tasking, negotiating obstacles.    Posture/Postural Control   Posture/Postural Control Postural limitations   Postural Limitations Rounded Shoulders;Forward head;Increased thoracic kyphosis;Posterior pelvic tilt   Neuro Re-ed    Neuro Re-ed Details  Standing with BUE support at RW, pt performed L hip/knee flexion (as in LLE advancement) to touch target on floor with yellow Tband anchored proximal to L knee, then at L ankle for increased proprioceptive input. Performed 2 trials x20 reps then x10 reps each without mirror, then performed x10 reps with mirror positioned to L and anterior to pt for visual feedback. Cueing focused on maintaining normal BOS (due to tendency toward L LE adduction)                  PT Short Term Goals - 02/04/15 1731    PT SHORT TERM GOAL #1   Title The patient will perform HEP with supervision from family for safety. Target Date July 12th.   Baseline Pt dependent for HEP.   Status Achieved   PT SHORT TERM GOAL #2   Title The patient will move sit<>stand with SBA to demo improved functional independence. Target Date July 12th.   Baseline Pt requires Min A for sit<>stand.   Status Achieved   PT SHORT TERM GOAL #3   Title The patient will ambulate with LRAD and mod A x 50 feet on indoor level surface. Target Date July 12th.    Baseline Achieved 01/22/15, as pt ambulated x60' with RW and min A.   Status Achieved   PT SHORT TERM GOAL #4    Title Pt will improve PASS score from 19/36 to 21/36 to demo improved functional mobility. Target Date July 12th.    Baseline PASS score: 19/36.   Status Achieved  25/36 on 02/04/15           PT Long Term Goals - 02/08/15 2046    PT LONG TERM GOAL #1   Title Pt will verbalize understanding of stroke risk factors and warning signs. Modified target date: 03/19/15.   Baseline Pt requires total A to recall stroke risk factors and warning signs.   Status On-going   PT LONG TERM GOAL #2   Title pt will improve PASS score from 19/36 to 23/36 to demo improved functional mobility.  Target date: 03/02/15.   Baseline PASS score: 19/36.   Status Achieved  25/36 on 02/04/15.   PT LONG TERM GOAL #3   Title The patient will ambulate over level surfaces with LRAD and CGA x 75 feet for improved household ambulation.Target date: 03/02/15   Status Achieved  Met 02/04/15.   PT LONG TERM GOAL #4   Title Pt will ambulate x200' over level, indoor surfaces with LRAD and mod I to indicate progress toward safety with household ambulation. Target date: 03/19/15.   Baseline 115' over level, indoor surfaces with close supervision during linear gait, min guard during turns with RW and AFO.   Status New   PT LONG TERM GOAL #5   Title Pt will ambulate x300' over unlevel surfaces with LRAD and supervision to indicate ability to ambulate outside of home when accompanied by girlfriend. Target date: 03/19/15.   Baseline 115' over level, indoor surfaces with close supervision during linear gait, min guard during turns with RW and AFO.   Status On-going   PT LONG TERM GOAL #6   Title Pt will negotiate standard curb with LRAD requiring CGA and 25% cueing to indicate ability to negotiate curbs in community. Target date: 03/19/15.   Baseline Negotiates curb step in w/c with total A of girlfriend.   Status On-going   PT LONG TERM GOAL #7   Title Patient will perform supine <> sit with mod I, increased time to both right and left  sides to indicate increased safety/independence getting into/out of bed and on/off of couch. Target date: 03/19/15.   Baseline For supine > sit, pt required min A, 25% cueing due to perceputal impairments. For sit > supine, pt requires mod A for BLE management and 50% cueing for technique.   Status On-going               Plan - 02/24/15 1915    Clinical Impression Statement Pt currently ambulating limited community distances with RW and supervision of girlfriend; therefore, focused on community mobility, obstacle negotiation with [facility]. Added home exercise to promote LLE clearance during LLE advancement by adding Tband resistance for proprioceptive input.    Pt will benefit from skilled therapeutic intervention in order to improve on the following deficits Abnormal gait;Decreased coordination;Decreased range of motion;Difficulty walking;Impaired tone;Decreased safety awareness;Impaired UE functional use;Decreased balance;Decreased knowledge of use of DME;Impaired flexibility;Decreased strength;Decreased mobility;Decreased cognition;Impaired sensation;Postural dysfunction;Decreased activity tolerance;Decreased endurance   Rehab Potential Good   Clinical Impairments Affecting Rehab Potential Cognitive impairments, L inattention   PT Frequency 1x / week   PT Duration 6 weeks  12 weeks total from start of POC   PT Treatment/Interventions Passive range of motion;Patient/family education;Orthotic Fit/Training;Wheelchair mobility training;Manual techniques;Neuromuscular re-education;Balance training;Therapeutic exercise;Therapeutic activities;Functional mobility training;DME Instruction;Gait training;Stair training;Electrical Stimulation;Biofeedback;ADLs/Self Care Home Management;Cognitive remediation   PT Next Visit Plan Gait training (continue to progress to [facility]). Increase pt safety with supervision in home environment (obstacle negotiation, turning). Bed mobility. Outdoor ambulation. Revisit ramp  and curb step negotiation.   Consulted and Agree with Plan of Care Patient;Family member/caregiver   Family Member Consulted Girlfriend, Education officer, environmental        Problem List Patient Active Problem List   Diagnosis Date Noted  . Gait disturbance, post-stroke 01/19/2015  . Atrial fibrillation 12/25/2014  . Hypertensive heart disease 12/15/2014  . Cardiomyopathy 12/08/2014  . Insomnia 12/07/2014  . Essential hypertension 12/07/2014  . Secondary cardiomyopathy 11/05/2014  . Malignant hypertension 11/05/2014  . Hyperlipidemia LDL goal <70 11/05/2014  . Cigarette smoker  11/05/2014  . Marijuana abuse 11/05/2014  . Hypokalemia 11/05/2014  . Left hemiparesis 11/05/2014  . Cognitive deficit, post-stroke 11/05/2014  . Left-sided weakness   . Acute right anterior cerebral artery (ACA) ischemic stroke 10/31/2014    Blair Hobble, PT, DPT [facility] [address] [place], [place], [address] Phone: [phone]   Fax:  [phone] 02/24/2015, 7:19 PM      

## 2015-03-01 ENCOUNTER — Other Ambulatory Visit: Payer: Self-pay | Admitting: Family Medicine

## 2015-03-01 ENCOUNTER — Other Ambulatory Visit: Payer: Self-pay | Admitting: *Deleted

## 2015-03-01 NOTE — Telephone Encounter (Signed)
Patient called to request a med refill for Trazadone and Norvasc , please f/u

## 2015-03-01 NOTE — Telephone Encounter (Signed)
Refill for trazadone and norvasc given and transferred to front desk to get f/u with [facility]

## 2015-03-02 ENCOUNTER — Other Ambulatory Visit: Payer: Self-pay | Admitting: *Deleted

## 2015-03-02 ENCOUNTER — Ambulatory Visit: Payer: Medicaid Other | Admitting: Physical Therapy

## 2015-03-02 ENCOUNTER — Ambulatory Visit: Payer: Medicaid Other | Admitting: Occupational Therapy

## 2015-03-02 MED ORDER — TRAZODONE HCL 50 MG PO TABS: 50.0000 mg | ORAL_TABLET | Freq: Every evening | ORAL | Status: DC | PRN

## 2015-03-02 MED ORDER — AMLODIPINE BESYLATE 10 MG PO TABS: 10.0000 mg | ORAL_TABLET | Freq: Every day | ORAL | Status: DC

## 2015-03-05 ENCOUNTER — Ambulatory Visit: Payer: Medicaid Other | Admitting: Occupational Therapy

## 2015-03-05 ENCOUNTER — Ambulatory Visit: Payer: Medicaid Other | Admitting: Physical Therapy

## 2015-03-05 DIAGNOSIS — IMO0002 Reserved for concepts with insufficient information to code with codable children: Secondary | ICD-10-CM

## 2015-03-05 DIAGNOSIS — I69319 Unspecified symptoms and signs involving cognitive functions following cerebral infarction: Secondary | ICD-10-CM

## 2015-03-05 DIAGNOSIS — I69359 Hemiplegia and hemiparesis following cerebral infarction affecting unspecified side: Secondary | ICD-10-CM

## 2015-03-05 DIAGNOSIS — R2681 Unsteadiness on feet: Secondary | ICD-10-CM

## 2015-03-05 DIAGNOSIS — R269 Unspecified abnormalities of gait and mobility: Principal | ICD-10-CM

## 2015-03-05 DIAGNOSIS — R278 Other lack of coordination: Secondary | ICD-10-CM

## 2015-03-05 DIAGNOSIS — I69398 Other sequelae of cerebral infarction: Secondary | ICD-10-CM

## 2015-03-05 DIAGNOSIS — I69898 Other sequelae of other cerebrovascular disease: Secondary | ICD-10-CM | POA: Diagnosis not present

## 2015-03-05 DIAGNOSIS — R279 Unspecified lack of coordination: Secondary | ICD-10-CM

## 2015-03-05 NOTE — Patient Instructions (Addendum)
NGLE LIMB STANCE   Stand with your back to a corner (not touching but close, just in case you lose your balance) and a stable chair in front of you.   Stance: single leg on floor. Raise leg. Hold _10__ seconds. Repeat with other leg. __1_ reps per set, 1-2___ sets per day, _5__ days per week  Feet Apart (Compliant Surface) Varied Arm Positions - Eyes Closed   Stand on compliant surface with corner behind you (but don't touch it), stable chair in front of you (only use if needed)with feet shoulder width apart and arms by your side. Close eyes and visualize upright position. Hold 30 seconds. Repeat 5 times per day.  Copyright  VHI. All rights reserved.

## 2015-03-05 NOTE — Therapy (Signed)
[facility] [address] [place], [place], [address] Phone: [phone]   Fax:  [phone]  Occupational Therapy Treatment  Patient Details  Name: [name] Short MRN: [id number] Date of Birth: 07/20/1966 Referring Provider:  Dessa Phi, MD  Encounter Date: 03/05/2015      OT End of Session - 03/05/15 1323    Visit Number 7   Number of Visits 12   Authorization Type Medicaid   Authorization - Visit Number 7   Authorization - Number of Visits 12   OT Start Time 1034   OT Stop Time 1105   OT Time Calculation (min) 31 min   Activity Tolerance Patient tolerated treatment well   Behavior During Therapy [facility] for tasks assessed/performed      Past Medical History  Diagnosis Date  . Hypertension     a. Previously on meds but none in several years.  . Smoker     a. Previous tobacco - quit ~ 2013. Ongoing daily marijuana usage. Occasional cigar.  . Stroke     a. 10/2014 MRI Head: acute mod size nonhemorrhagic R ant cerebral artery distribution infarct involving the medial aspect of the right frontal and parietal lobe and right aspect of the corpus callosum w/ local mass effect-->TPA;  b. 10/2014 MRA: Abrupt cut off of the R ACA A2 segment;  b. 10/2014 Carotid U/S: 1-39% bilat ICA stenosis.  . Cardiomyopathy     a. 10/2014 Echo: EF 35-40%, diff HK, sev LVH, mod dil LA, PASP .;  b.  Echo 7/16: Severe LVH, EF 55-60%, aortic sclerosis without stenosis, mild AI, trivial MR, severe BAE    Past Surgical History  Procedure Laterality Date  . Tee without cardioversion N/A 11/03/2014    Procedure: TRANSESOPHAGEAL ECHOCARDIOGRAM (TEE);  Surgeon: Laurey Morale, MD;  Location: [facility] ENDOSCOPY;  Service: Cardiovascular;  Laterality: N/A;    There were no vitals filed for this visit.  Visit Diagnosis:  Weakness due to cerebrovascular accident  Cognitive deficit due to recent cerebrovascular accident  Decreased  coordination  Hemiparesis affecting nondominant side as late effect of stroke      Subjective Assessment - 03/05/15 1035    Subjective  denies pain   Patient is accompained by: Family member   Pertinent History HTN   Patient Stated Goals Improve use of left hand, return to independence with ADL/IADL   Currently in Pain? No/denies       Treatment:Neuro re-ed: Seated closed chain mid range shoulder flexion with medium ball, min facilitation v.c. Theraputic activities:Standing for functional reaching with LUE to place large pegs in pegboard with LUE min v.c. facilitation for weight shift to left side. Pt demonstrates improving LUE functional use and in hand manipulation of pegs. Pt left early as he had not taken BP medication and pt's girl friend demonstrated concern. BP was 138/94.                         OT Short Term Goals - 02/24/15 1023    OT SHORT TERM GOAL #1   Title (p) Patient will don pants with no more than  min assist   Baseline (p) 02/24/15- not consistent, min-mod A   Status (p) On-going   OT SHORT TERM GOAL #2   Title (p) Patient will don socks and shoes with set up assistance and increased time   Baseline (p) 02/24/15- min A for brace   Status (p) On-going   OT SHORT  TERM GOAL #3   Title (p) Patient will stand at sink to complete hygiene task with no grater than initial min assist and then close supervision   Status (p) Achieved   OT SHORT TERM GOAL #4   Title (p) Patient will transfer to shower using tub transfer bench and close supervision   Status (p) Achieved           OT Long Term Goals - 01/27/15 1356    OT LONG TERM GOAL #1   Title Patient will dress himself with modified independence   Baseline mod   Time 8   Period Weeks   Status On-going   OT LONG TERM GOAL #2   Title Patient will toilet himself with supervision (includes transfers, hygiene and clothing management)   Baseline mod assist   Time 8   Period Weeks   Status  On-going   OT LONG TERM GOAL #3   Title Patient will prepare simple meal for himself (cold) with supervision, incorporating standing in part   Baseline dependent   Time 8   Period Weeks   Status On-going   OT LONG TERM GOAL #4   Title Patient will shower himself with supervision(include transfer and bathing)   Baseline mod   Time 8   Period Weeks   Status On-going               Plan - 03/05/15 1322    Clinical Impression Statement Pt is progressing towards goals with improving ADL performance per girlfriend report. Pt demonstrates improving standing balance and use of LUE.   Pt will benefit from skilled therapeutic intervention in order to improve on the following deficits (Retired) Decreased balance;Decreased cognition;Decreased coordination;Decreased safety awareness;Impaired sensation;Impaired tone;Impaired UE functional use;Decreased strength;Impaired perceived functional ability;Impaired vision/preception;Decreased knowledge of use of DME;Decreased range of motion   Rehab Potential Good   OT Frequency 2x / week   OT Duration 12 weeks   OT Treatment/Interventions Self-care/ADL training;Therapeutic exercise;Neuromuscular education;Functional Mobility Training;Passive range of motion;Therapeutic activities;Cognitive remediation/compensation;Visual/perceptual remediation/compensation;DME and/or AE instruction;Patient/family education;Balance training   Plan lower body dressing, LUE functional use   Consulted and Agree with Plan of Care Patient;Family member/caregiver   Family Member Consulted girlfriend- [name]        Problem List Patient Active Problem List   Diagnosis Date Noted  . Gait disturbance, post-stroke 01/19/2015  . Atrial fibrillation 12/25/2014  . Hypertensive heart disease 12/15/2014  . Cardiomyopathy 12/08/2014  . Insomnia 12/07/2014  . Essential hypertension 12/07/2014  . Secondary cardiomyopathy 11/05/2014  . Malignant hypertension 11/05/2014  .  Hyperlipidemia LDL goal <70 11/05/2014  . Cigarette smoker 11/05/2014  . Marijuana abuse 11/05/2014  . Hypokalemia 11/05/2014  . Left hemiparesis 11/05/2014  . Cognitive deficit, post-stroke 11/05/2014  . Left-sided weakness   . Acute right anterior cerebral artery (ACA) ischemic stroke 10/31/2014    RINE,KATHRYN 03/05/2015, 1:24 PM Keene Breath, OTR/L [phone] Phone: [phone] 1:24 PM 08/12/2016[facility] [address] [place], [place], [address] Phone: [phone]   Fax:  [phone]

## 2015-03-05 NOTE — Therapy (Signed)
[facility] [address] [place], [place], [address] Phone: [phone]   Fax:  [phone]  Physical Therapy Treatment  Patient Details  Name: [name] Short MRN: [id number] Date of Birth: [birth year] Referring Provider:  Boykin Nearing, MD  Encounter Date: 03/05/2015      PT End of Session - 03/05/15 1655    Visit Number 10   Number of Visits 13   Date for PT Re-Evaluation 04/08/15   Authorization Type Medicaid   Authorization Time Period 7/20 - 03/30/15   PT Start Time 0938  Pt arrived late to session   PT Stop Time 1017   PT Time Calculation (min) 39 min   Equipment Utilized During Treatment Gait belt   Activity Tolerance Patient tolerated treatment well   Behavior During Therapy [facility] for tasks assessed/performed      Past Medical History  Diagnosis Date  . Hypertension     a. Previously on meds but none in several years.  . Smoker     a. Previous tobacco - quit ~ 2013. Ongoing daily marijuana usage. Occasional cigar.  . Stroke     a. 10/2014 MRI Head: acute mod size nonhemorrhagic R ant cerebral artery distribution infarct involving the medial aspect of the right frontal and parietal lobe and right aspect of the corpus callosum w/ local mass effect-->TPA;  b. 10/2014 MRA: Abrupt cut off of the R ACA A2 segment;  b. 10/2014 Carotid U/S: 1-39% bilat ICA stenosis.  . Cardiomyopathy     a. 10/2014 Echo: EF 35-40%, diff HK, sev LVH, mod dil LA, PASP 31mHg.;  b.  Echo 7/16: Severe LVH, EF 55-60%, aortic sclerosis without stenosis, mild AI, trivial MR, severe BAE    Past Surgical History  Procedure Laterality Date  . Tee without cardioversion N/A 11/03/2014    Procedure: TRANSESOPHAGEAL ECHOCARDIOGRAM (TEE);  Surgeon: DLarey Dresser MD;  [facility]  Service: Cardiovascular;  Laterality: N/A;    There were no vitals filed for this visit.  Visit Diagnosis:  Abnormality of gait following cerebrovascular  accident  Unsteadiness on feet  Hemiparesis affecting nondominant side as late effect of stroke      Subjective Assessment - 03/05/15 0940    Subjective Pt denies falls, changes. Reports he has thrown away the urinal and has been ambulating to/from bathroom at night time. Pt stated, "I need to work on my balance."   Patient is accompained by: Family member   Pertinent History HTN, A-Fib, cognitive impairments   Patient Stated Goals pt wishes to be able to walk again.    Currently in Pain? No/denies                         [facility] Adult PT Treatment/Exercise - 03/05/15 0001    Transfers   Transfers Sit to Stand;Stand to Sit  blocked practice with/without UE use   Sit to Stand 6: Modified independent (Device/Increase time)  with RW   Stand to Sit 6: Modified independent (Device/Increase time)  with RW   Ambulation/Gait   Ambulation/Gait Yes   Ambulation/Gait Assistance 4: Min guard;5: Supervision;4: Min assist   Ambulation/Gait Assistance Details (S) for gait with RW; Min guard to Min A with SPC, and Min A without SPC.   Ambulation Distance (Feet) 450 Feet  x445 indoors (115' with QC; 230' with RW);100' outdoors - RW   Assistive device Rolling walker;Straight cane;None  L AFO   Gait Pattern Decreased hip/knee flexion -  left;Decreased step length - left;Decreased trunk rotation;Trunk rotated posteriorly on left;Narrow base of support;Step-through pattern  LLE adduction; L hip internal rotation   Ambulation Surface Level;Indoor   Ramp 5: Supervision;4: Min assist  supervision with RW; Min A with straight cane   Curb 4: Min assist  with [facility]   Gait Comments Gait x90' without AD with yellow Tband anchored from R foot to lateral aspect of L hip, pulling L hip into external rotation. When ascending ramp with SPC, pt required tatctile cueing for lateral weight shift to R side, increase L hip/knee flexion for LLE clearance. When descending ramp with RW, pt required cueing  for change in hand placement for controlled RW advancement   Posture/Postural Control   Posture/Postural Control Postural limitations   Postural Limitations Rounded Shoulders;Forward head;Increased thoracic kyphosis;Posterior pelvic tilt  seated   Neuro Re-ed    Neuro Re-ed Details  --             Balance Exercises - 03/05/15 1006    Balance Exercises: Standing   Standing Eyes Opened Narrow base of support (BOS)  > 30 seconds, no UE support, no LOB   Standing Eyes Closed Foam/compliant surface;30 secs  in corner with (S); added to HEP   SLS Eyes open;Solid surface;10 secs  in corner; added to HEP           PT Education - 03/05/15 1650    Education provided Yes   Education Details Balance home exercises. See Pt instructions.   Person(s) Educated Patient;Spouse   Methods Demonstration;Explanation;Handout   Comprehension Verbalized understanding;Returned demonstration;Verbal cues required          PT Short Term Goals - 02/04/15 1731    PT SHORT TERM GOAL #1   Title The patient will perform HEP with supervision from family for safety. Target Date July 12th.   Baseline Pt dependent for HEP.   Status Achieved   PT SHORT TERM GOAL #2   Title The patient will move sit<>stand with SBA to demo improved functional independence. Target Date July 12th.   Baseline Pt requires Min A for sit<>stand.   Status Achieved   PT SHORT TERM GOAL #3   Title The patient will ambulate with LRAD and mod A x 50 feet on indoor level surface. Target Date July 12th.    Baseline Achieved 01/22/15, as pt ambulated x60' with RW and min A.   Status Achieved   PT SHORT TERM GOAL #4   Title Pt will improve PASS score from 19/36 to 21/36 to demo improved functional mobility. Target Date July 12th.    Baseline PASS score: 19/36.   Status Achieved  25/36 on 02/04/15           PT Long Term Goals - 02/08/15 2046    PT LONG TERM GOAL #1   Title Pt will verbalize understanding of stroke risk  factors and warning signs. Modified target date: 03/19/15.   Baseline Pt requires total A to recall stroke risk factors and warning signs.   Status On-going   PT LONG TERM GOAL #2   Title pt will improve PASS score from 19/36 to 23/36 to demo improved functional mobility. Target date: 03/02/15.   Baseline PASS score: 19/36.   Status Achieved  25/36 on 02/04/15.   PT LONG TERM GOAL #3   Title The patient will ambulate over level surfaces with LRAD and CGA x 75 feet for improved household ambulation.Target date: 03/02/15   Status Achieved  Met 02/04/15.  PT LONG TERM GOAL #4   Title Pt will ambulate x200' over level, indoor surfaces with LRAD and mod I to indicate progress toward safety with household ambulation. Target date: 03/19/15.   Baseline 115' over level, indoor surfaces with close supervision during linear gait, min guard during turns with RW and AFO.   Status New   PT LONG TERM GOAL #5   Title Pt will ambulate x300' over unlevel surfaces with LRAD and supervision to indicate ability to ambulate outside of home when accompanied by girlfriend. Target date: 03/19/15.   Baseline 115' over level, indoor surfaces with close supervision during linear gait, min guard during turns with RW and AFO.   Status On-going   PT LONG TERM GOAL #6   Title Pt will negotiate standard curb with LRAD requiring CGA and 25% cueing to indicate ability to negotiate curbs in community. Target date: 03/19/15.   Baseline Negotiates curb step in w/c with total A of girlfriend.   Status On-going   PT LONG TERM GOAL #7   Title Patient will perform supine <> sit with mod I, increased time to both right and left sides to indicate increased safety/independence getting into/out of bed and on/off of couch. Target date: 03/19/15.   Baseline For supine > sit, pt required min A, 25% cueing due to perceputal impairments. For sit > supine, pt requires mod A for BLE management and 50% cueing for technique.   Status On-going                Plan - 03/05/15 1656    Clinical Impression Statement Session focused on continued gait training with less restrictive AD. When gait with SPC trialed, pt did demonstate some difficulty seqencing SPC with gait; also noted decreased LLE clearance with SPC as compared to RW. Initiated standing balance home exercises today. Continue per POC.   Pt will benefit from skilled therapeutic intervention in order to improve on the following deficits Abnormal gait;Decreased coordination;Decreased range of motion;Difficulty walking;Impaired tone;Decreased safety awareness;Impaired UE functional use;Decreased balance;Decreased knowledge of use of DME;Impaired flexibility;Decreased strength;Decreased mobility;Decreased cognition;Impaired sensation;Postural dysfunction;Decreased activity tolerance;Decreased endurance   Rehab Potential Good   Clinical Impairments Affecting Rehab Potential Cognitive impairments, L inattention   PT Frequency 1x / week   PT Duration 6 weeks  12 weeks from start of POC   PT Treatment/Interventions Passive range of motion;Patient/family education;Orthotic Fit/Training;Wheelchair mobility training;Manual techniques;Neuromuscular re-education;Balance training;Therapeutic exercise;Therapeutic activities;Functional mobility training;DME Instruction;Gait training;Stair training;Electrical Stimulation;Biofeedback;ADLs/Self Care Home Management;Cognitive remediation   PT Next Visit Plan Gait training (continue to progress to [facility] vs. SPC, pending pt progress). Increase pt safety with supervision in home environment (obstacle negotiation, turning). Bed mobility. Outdoor ambulation. Revisit ramp and curb step negotiation.   Consulted and Agree with Plan of Care Patient;Family member/caregiver   Family Member Consulted Girlfriend, Education officer, environmental        Problem List Patient Active Problem List   Diagnosis Date Noted  . Gait disturbance, post-stroke 01/19/2015  . Atrial  fibrillation 12/25/2014  . Hypertensive heart disease 12/15/2014  . Cardiomyopathy 12/08/2014  . Insomnia 12/07/2014  . Essential hypertension 12/07/2014  . Secondary cardiomyopathy 11/05/2014  . Malignant hypertension 11/05/2014  . Hyperlipidemia LDL goal <70 11/05/2014  . Cigarette smoker 11/05/2014  . Marijuana abuse 11/05/2014  . Hypokalemia 11/05/2014  . Left hemiparesis 11/05/2014  . Cognitive deficit, post-stroke 11/05/2014  . Left-sided weakness   . Acute right anterior cerebral artery (ACA) ischemic stroke 10/31/2014    Billie Ruddy, PT, DPT [facility]  [place] [address] [place], [place], [address] Phone: [phone]   Fax:  [phone] 03/05/2015, 5:00 PM

## 2015-03-12 ENCOUNTER — Ambulatory Visit: Payer: Medicaid Other | Admitting: Physical Therapy

## 2015-03-12 ENCOUNTER — Encounter: Payer: Self-pay | Admitting: Occupational Therapy

## 2015-03-12 ENCOUNTER — Ambulatory Visit: Payer: Medicaid Other | Admitting: Occupational Therapy

## 2015-03-12 ENCOUNTER — Telehealth: Payer: Self-pay | Admitting: Physical Therapy

## 2015-03-12 DIAGNOSIS — R278 Other lack of coordination: Secondary | ICD-10-CM

## 2015-03-12 DIAGNOSIS — I69898 Other sequelae of other cerebrovascular disease: Secondary | ICD-10-CM | POA: Diagnosis not present

## 2015-03-12 DIAGNOSIS — R269 Unspecified abnormalities of gait and mobility: Secondary | ICD-10-CM

## 2015-03-12 DIAGNOSIS — I69398 Other sequelae of cerebral infarction: Secondary | ICD-10-CM

## 2015-03-12 DIAGNOSIS — R2681 Unsteadiness on feet: Secondary | ICD-10-CM

## 2015-03-12 DIAGNOSIS — R279 Unspecified lack of coordination: Principal | ICD-10-CM

## 2015-03-12 DIAGNOSIS — I69359 Hemiplegia and hemiparesis following cerebral infarction affecting unspecified side: Secondary | ICD-10-CM

## 2015-03-12 NOTE — Addendum Note (Signed)
Addended by: Erick Colace on: 03/12/2015 05:09 PM   Modules accepted: Orders

## 2015-03-12 NOTE — Patient Instructions (Addendum)
SINGLE LIMB STANCE   Stand with your back to a corner (not touching but close, just in case you lose your balance) and a stable chair in front of you.   Stance: single leg on floor. Raise leg. Hold _10__ seconds. Repeat with other leg. __1_ reps per set, 1-2___ sets per day, _5__ days per week.  Progress from 2 hands on chair to 1 hand on chair, then eventually stand with no hands on chair.     Feet Apart (Compliant Surface) Varied Arm Positions - Eyes Closed   Stand on compliant surface with corner behind you (but don't touch it), stable chair in front of you (only use if needed) with feet shoulder width apart. ** Make sure [name] Short is beside you. Close your eyes, keep arms by your side, and hold this position for 30 seconds.  Repeat 4 times per day.

## 2015-03-12 NOTE — Telephone Encounter (Signed)
Dr. Wynn Banker,  Mr. [name] Short has been utilizing a small base quad cane during outpatient PT sessions. Pt would benefit from a personal small base quad cane to progress functional mobility.  If you agree, please submit an order for a small base quad cane.  Thank you, Jorje Guild, PT, DPT [facility] [address] [place], [place], [address] Phone: [phone]   Fax:  [phone] 03/12/2015, 4:55 PM

## 2015-03-12 NOTE — Therapy (Signed)
[facility] [address] [place], [place], [address] Phone: [phone]   Fax:  [phone]  Occupational Therapy Treatment  Patient Details  Name: [name] Short MRN: [id number] Date of Birth: [birth year] Referring Provider:  Dessa Phi, MD  Encounter Date: 03/12/2015      OT End of Session - 03/12/15 1723    Visit Number 8   Number of Visits 12   Authorization Type Medicaid   Authorization - Visit Number 8   Authorization - Number of Visits 12   OT Start Time 1530   OT Stop Time 1615   OT Time Calculation (min) 45 min   Activity Tolerance Patient tolerated treatment well   Behavior During Therapy [facility] for tasks assessed/performed      Past Medical History  Diagnosis Date  . Hypertension     a. Previously on meds but none in several years.  . Smoker     a. Previous tobacco - quit ~ 2013. Ongoing daily marijuana usage. Occasional cigar.  . Stroke     a. 10/2014 MRI Head: acute mod size nonhemorrhagic R ant cerebral artery distribution infarct involving the medial aspect of the right frontal and parietal lobe and right aspect of the corpus callosum w/ local mass effect-->TPA;  b. 10/2014 MRA: Abrupt cut off of the R ACA A2 segment;  b. 10/2014 Carotid U/S: 1-39% bilat ICA stenosis.  . Cardiomyopathy     a. 10/2014 Echo: EF 35-40%, diff HK, sev LVH, mod dil LA, PASP .;  b.  Echo 7/16: Severe LVH, EF 55-60%, aortic sclerosis without stenosis, mild AI, trivial MR, severe BAE    Past Surgical History  Procedure Laterality Date  . Tee without cardioversion N/A 11/03/2014    Procedure: TRANSESOPHAGEAL ECHOCARDIOGRAM (TEE);  Surgeon: Laurey Morale, MD;  Location: [facility];  Service: Cardiovascular;  Laterality: N/A;    There were no vitals filed for this visit.  Visit Diagnosis:  Decreased coordination  Hemiparesis affecting nondominant side as late effect of stroke  Unsteadiness on feet       Subjective Assessment - 03/12/15 1712    Subjective  I feel like my memory is a little better   Patient is accompained by: Family member   Pertinent History HTN   Patient Stated Goals Improve use of left hand, return to independence with ADL/IADL   Currently in Pain? No/denies   Pain Score 0-No pain                      OT Treatments/Exercises (OP) - 03/12/15 0001    ADLs   Cooking Patient ambulating with small based quad cane today, in kitchen  to prepare a simple meal.  Patient decided to scramble eggs.  Reviewed goal of exercise was to use left hand functionally - patient able to retrieve all necessary items with left hand.  Patient carried a full dozen eggs in a carton across the room with his left hand, lifted a glass bowl from an overhead shelf with left hand, carried half gallon of milk without incident.  Patient was very safe during this activity, very careful with his left hand near hot stove.  Patient with improved anticipatory awareness also when discussing application of these skills at home and need for supervision.  Significant physical and cognitive improvement noted.                  OT Education - 03/12/15 1723    Education provided  Yes   Education Details Patient is ready to prepare simple meals at home with girlfriend. Patient needs supervision and occasional cueing for this activity.    Person(s) Educated Patient;Spouse   Methods Explanation;Demonstration   Comprehension Verbalized understanding;Returned demonstration;Verbal cues required          OT Short Term Goals - 03/12/15 1726    OT SHORT TERM GOAL #1   Title Patient will don pants with no more thanmin assist   Status Achieved   OT SHORT TERM GOAL #2   Title Patient will don socks and shoes with set up assistance and increased time   Status Achieved   OT SHORT TERM GOAL #3   Title Patient will stand at sink to complete hygiene task with no grater than initial min assist and then  close supervision   Status Achieved   OT SHORT TERM GOAL #4   Title Patient will transfer to shower using tub transfer bench and close supervision   Status Achieved           OT Long Term Goals - 03/12/15 1726    OT LONG TERM GOAL #1   Title Patient will dress himself with modified independence   Status On-going   OT LONG TERM GOAL #2   Title Patient will toilet himself with supervision (includes transfers, hygiene and clothing management)   Status Achieved   OT LONG TERM GOAL #3   Title Patient will prepare simple meal for himself (cold) with supervision, incorporating standing in part   Status Achieved   OT LONG TERM GOAL #4   Title Patient will shower himself with supervision(include transfer and bathing)   Status Achieved               Plan - 03/12/15 1724    Clinical Impression Statement Patient is progressing toward OT goals and is eager for improved functional use and control of left hand.    Pt will benefit from skilled therapeutic intervention in order to improve on the following deficits (Retired) Decreased balance;Decreased cognition;Decreased coordination;Decreased safety awareness;Impaired sensation;Impaired tone;Impaired UE functional use;Decreased strength;Impaired perceived functional ability;Impaired vision/preception;Decreased knowledge of use of DME;Decreased range of motion   Rehab Potential Good   OT Frequency 1x / week   OT Duration 12 weeks   OT Treatment/Interventions Self-care/ADL training;Therapeutic exercise;Neuromuscular education;Functional Mobility Training;Passive range of motion;Therapeutic activities;Cognitive remediation/compensation;Visual/perceptual remediation/compensation;DME and/or AE instruction;Patient/family education;Balance training   Plan fine motor control n left - wil do best with known activity versus novel task   Consulted and Agree with Plan of Care Patient;Family member/caregiver   Family Member Consulted girlfriend-  [name]        Problem List Patient Active Problem List   Diagnosis Date Noted  . Gait disturbance, post-stroke 01/19/2015  . Atrial fibrillation 12/25/2014  . Hypertensive heart disease 12/15/2014  . Cardiomyopathy 12/08/2014  . Insomnia 12/07/2014  . Essential hypertension 12/07/2014  . Secondary cardiomyopathy 11/05/2014  . Malignant hypertension 11/05/2014  . Hyperlipidemia LDL goal <70 11/05/2014  . Cigarette smoker 11/05/2014  . Marijuana abuse 11/05/2014  . Hypokalemia 11/05/2014  . Left hemiparesis 11/05/2014  . Cognitive deficit, post-stroke 11/05/2014  . Left-sided weakness   . Acute right anterior cerebral artery (ACA) ischemic stroke 10/31/2014    Collier Salina, OTR/L 03/12/2015, 5:27 PM  [facility] [address] [place], [place], [address] Phone: [phone]   Fax:  [phone]

## 2015-03-13 NOTE — Therapy (Signed)
[facility] [address] [place], [place], [address] Phone: [phone]   Fax:  [phone]  Physical Therapy Treatment  Patient Details  Name: [name] Short MRN: [id number] Date of Birth: 1965-10-01 Referring Provider:  Boykin Nearing, MD  Encounter Date: 03/12/2015      PT End of Session - 03/13/15 1154    Visit Number 11   Number of Visits 13   Date for PT Re-Evaluation 04/08/15   Authorization Type Medicaid   Authorization Time Period 7/20 - 03/30/15   PT Start Time 1454  Pt arrived late to session   PT Stop Time 1530   PT Time Calculation (min) 36 min   Equipment Utilized During Treatment Gait belt   Activity Tolerance Patient tolerated treatment well   Behavior During Therapy [facility] for tasks assessed/performed      Past Medical History  Diagnosis Date  . Hypertension     a. Previously on meds but none in several years.  . Smoker     a. Previous tobacco - quit ~ 2013. Ongoing daily marijuana usage. Occasional cigar.  . Stroke     a. 10/2014 MRI Head: acute mod size nonhemorrhagic R ant cerebral artery distribution infarct involving the medial aspect of the right frontal and parietal lobe and right aspect of the corpus callosum w/ local mass effect-->TPA;  b. 10/2014 MRA: Abrupt cut off of the R ACA A2 segment;  b. 10/2014 Carotid U/S: 1-39% bilat ICA stenosis.  . Cardiomyopathy     a. 10/2014 Echo: EF 35-40%, diff HK, sev LVH, mod dil LA, PASP 44mHg.;  b.  Echo 7/16: Severe LVH, EF 55-60%, aortic sclerosis without stenosis, mild AI, trivial MR, severe BAE    Past Surgical History  Procedure Laterality Date  . Tee without cardioversion N/A 11/03/2014    Procedure: TRANSESOPHAGEAL ECHOCARDIOGRAM (TEE);  Surgeon: DLarey Dresser MD;  [place] [facility]  Service: Cardiovascular;  Laterality: N/A;    There were no vitals filed for this visit.  Visit Diagnosis:  Abnormality of gait following cerebrovascular  accident  Unsteadiness on feet  Hemiparesis affecting nondominant side as late effect of stroke      Subjective Assessment - 03/12/15 1454    Subjective [place] Pt did have to negotiate a flight of stairs (2 rails but pt unable to reach both) to attend a social gathering. Pt required assist of 2 persons: strong cousin to assist pt with balance, and [name] to manually advance LLE.   Patient is accompained by: Family member   Pertinent History HTN, A-Fib, cognitive impairments   Patient Stated Goals pt wishes to be able to walk again.    Currently in Pain? No/denies                         [facility] Adult PT Treatment/Exercise - 03/13/15 0001    Transfers   Transfers Sit to Stand;Stand to Sit  blocked practice with/without UE use   Sit to Stand 6: Modified independent (Device/Increase time)  with RW   Stand to Sit 6: Modified independent (Device/Increase time)  with RW   Ambulation/Gait   Ambulation/Gait Yes   Ambulation/Gait Assistance 6: Modified independent (Device/Increase time);5: Supervision;4: Min guard   Ambulation/Gait Assistance Details Mod I for linear gait, gait <100' with RW, Min guard - Min A for gait without AD/with [facility]  Ambulation Distance (Feet) 500 Feet  [phone] without  AD; x230' with [facility], x155' with RW   Assistive device Rolling walker;None;Small based quad cane  L AFO   Gait Pattern Decreased hip/knee flexion - left;Decreased step length - left;Trunk rotated posteriorly on left;Narrow base of support;Step-through pattern;Left foot flat  LLE adduction/internal rotation throughout   Ambulation Surface Level;Indoor   Stairs Yes   Stairs Assistance 4: Min guard   Stair Management Technique One rail Right;One rail Left;Step to pattern;Sideways;Backwards;Forwards   Number of Stairs 12  4 stairs x3 consecutive trials   Height of Stairs 6   Ramp 4: Min assist  supervision with RW; Min A with  SBQC   Ramp Details (indicate cue type and reason) Min guard-min A; x5 consecutive trials with tactile cueing for lateral weight shift to R side, increased L hip/knee flexion for consistent LLE clearance   Curb 4: Min assist  with [facility]   Curb Details (indicate cue type and reason) Min guard-min A with SBQC. Pt with effective between-session carryover of technique/sequencing with SBQC. Cueing required for increased L hip/knee flexion during descent to ensure consistent clearance of curb   Gait Comments During gait trials with [facility], cueing focused on increased L hip/knee flexion during LLE advancement, attention to L heel strike. Pt/girlfirend demonstrated current stair negotiation technique (forrward-facing with R rail and step-to pattern), which required manual advancement of LLE during descent. Explained, demonstrated technique for negotiating laterally with BUE support on L rail, which pt was able to perform with min guard. Also instructed pt/girlfriend on descending backwards using R rail and leading with LLE, which pt was also able to perform with min guard. Pt/girlfriend gave effective return demonstration of both techniques.   Posture/Postural Control   Posture/Postural Control Postural limitations   Postural Limitations Rounded Shoulders;Forward head;Increased thoracic kyphosis;Posterior pelvic tilt  seated   Neuro Re-ed    Neuro Re-ed Details  Reviewed/progressed corner balance exercises. See Pt Instructions for details on exercises, reps, sets, frequency/duration.                PT Education - 03/13/15 1143    Education provided Yes   Education Details Technique for negotiating stairs with R rail or with L rail. Reviewed/progressed home balance exercises.   Person(s) Educated Patient;Spouse   Methods Explanation;Demonstration;Verbal cues;Handout;Tactile cues   Comprehension Verbalized understanding;Returned demonstration          PT Short Term Goals - 02/04/15 1731    PT  SHORT TERM GOAL #1   Title The patient will perform HEP with supervision from family for safety. Target Date July 12th.   Baseline Pt dependent for HEP.   Status Achieved   PT SHORT TERM GOAL #2   Title The patient will move sit<>stand with SBA to demo improved functional independence. Target Date July 12th.   Baseline Pt requires Min A for sit<>stand.   Status Achieved   PT SHORT TERM GOAL #3   Title The patient will ambulate with LRAD and mod A x 50 feet on indoor level surface. Target Date July 12th.    Baseline Achieved 01/22/15, as pt ambulated x60' with RW and min A.   Status Achieved   PT SHORT TERM GOAL #4   Title Pt will improve PASS score from 19/36 to 21/36 to demo improved functional mobility. Target Date July 12th.    Baseline PASS score: 19/36.   Status Achieved  25/36 on 02/04/15           PT Long Term Goals - 02/08/15 2046  PT LONG TERM GOAL #1   Title Pt will verbalize understanding of stroke risk factors and warning signs. Modified target date: 03/19/15.   Baseline Pt requires total A to recall stroke risk factors and warning signs.   Status On-going   PT LONG TERM GOAL #2   Title pt will improve PASS score from 19/36 to 23/36 to demo improved functional mobility. Target date: 03/02/15.   Baseline PASS score: 19/36.   Status Achieved  25/36 on 02/04/15.   PT LONG TERM GOAL #3   Title The patient will ambulate over level surfaces with LRAD and CGA x 75 feet for improved household ambulation.Target date: 03/02/15   Status Achieved  Met 02/04/15.   PT LONG TERM GOAL #4   Title Pt will ambulate x200' over level, indoor surfaces with LRAD and mod I to indicate progress toward safety with household ambulation. Target date: 03/19/15.   Baseline 115' over level, indoor surfaces with close supervision during linear gait, min guard during turns with RW and AFO.   Status New   PT LONG TERM GOAL #5   Title Pt will ambulate x300' over unlevel surfaces with LRAD and supervision  to indicate ability to ambulate outside of home when accompanied by girlfriend. Target date: 03/19/15.   Baseline 115' over level, indoor surfaces with close supervision during linear gait, min guard during turns with RW and AFO.   Status On-going   PT LONG TERM GOAL #6   Title Pt will negotiate standard curb with LRAD requiring CGA and 25% cueing to indicate ability to negotiate curbs in community. Target date: 03/19/15.   Baseline Negotiates curb step in w/c with total A of girlfriend.   Status On-going   PT LONG TERM GOAL #7   Title Patient will perform supine <> sit with mod I, increased time to both right and left sides to indicate increased safety/independence getting into/out of bed and on/off of couch. Target date: 03/19/15.   Baseline For supine > sit, pt required min A, 25% cueing due to perceputal impairments. For sit > supine, pt requires mod A for BLE management and 50% cueing for technique.   Status On-going               Plan - 03/13/15 1200    Clinical Impression Statement With demonstration/cueing, pt able to negotiate 6" stairs x12 using single rail (either side) with min guard of girlfriend. Pt also able to perform linear gait for household distances with RW and mod I. Pt/girlfriend exhibit effective between-session carryover of community obstacle negotiation with [facility]; therefore, it would be appropriate for pt to obtain personal SBQC and transiiton to using this for all mobility. Continue per POC.   Pt will benefit from skilled therapeutic intervention in order to improve on the following deficits Abnormal gait;Decreased coordination;Decreased range of motion;Difficulty walking;Impaired tone;Decreased safety awareness;Impaired UE functional use;Decreased balance;Decreased knowledge of use of DME;Impaired flexibility;Decreased strength;Decreased mobility;Decreased cognition;Impaired sensation;Postural dysfunction;Decreased activity tolerance;Decreased endurance   Rehab  Potential Good   Clinical Impairments Affecting Rehab Potential Cognitive impairments, L inattention   PT Frequency 1x / week   PT Duration 6 weeks   PT Treatment/Interventions Passive range of motion;Patient/family education;Orthotic Fit/Training;Wheelchair mobility training;Manual techniques;Neuromuscular re-education;Balance training;Therapeutic exercise;Therapeutic activities;Functional mobility training;DME Instruction;Gait training;Stair training;Electrical Stimulation;Biofeedback;ADLs/Self Care Home Management;Cognitive remediation   PT Next Visit Plan Begin to assess goals. Continue gait training with [facility]; focus on community gait, obstacle negotiation.   Recommended Other Services Requested MD order for [facility] on 8/19. Follow up  on this.   Consulted and Agree with Plan of Care Patient;Family member/caregiver   Family Member Consulted Girlfriend, Education officer, environmental        Problem List Patient Active Problem List   Diagnosis Date Noted  . Gait disturbance, post-stroke 01/19/2015  . Atrial fibrillation 12/25/2014  . Hypertensive heart disease 12/15/2014  . Cardiomyopathy 12/08/2014  . Insomnia 12/07/2014  . Essential hypertension 12/07/2014  . Secondary cardiomyopathy 11/05/2014  . Malignant hypertension 11/05/2014  . Hyperlipidemia LDL goal <70 11/05/2014  . Cigarette smoker 11/05/2014  . Marijuana abuse 11/05/2014  . Hypokalemia 11/05/2014  . Left hemiparesis 11/05/2014  . Cognitive deficit, post-stroke 11/05/2014  . Left-sided weakness   . Acute right anterior cerebral artery (ACA) ischemic stroke 10/31/2014    Billie Ruddy, PT, DPT [facility] [address] [place], [place], [address] Phone: [phone]   Fax:  [phone] 03/13/2015, 12:07 PM

## 2015-03-15 ENCOUNTER — Encounter: Payer: Self-pay | Admitting: Cardiology

## 2015-03-15 ENCOUNTER — Ambulatory Visit: Payer: Medicaid Other | Attending: Family Medicine | Admitting: Family Medicine

## 2015-03-15 ENCOUNTER — Ambulatory Visit (INDEPENDENT_AMBULATORY_CARE_PROVIDER_SITE_OTHER): Payer: Medicaid Other | Admitting: Cardiology

## 2015-03-15 ENCOUNTER — Encounter: Payer: Self-pay | Admitting: Family Medicine

## 2015-03-15 VITALS — BP 153/100 | HR 64 | Temp 98.5°F | Resp 16 | Ht 70.0 in | Wt 180.0 lb

## 2015-03-15 VITALS — BP 168/100 | HR 72 | Ht 70.0 in | Wt 179.0 lb

## 2015-03-15 DIAGNOSIS — E876 Hypokalemia: Secondary | ICD-10-CM | POA: Diagnosis not present

## 2015-03-15 DIAGNOSIS — I10 Essential (primary) hypertension: Secondary | ICD-10-CM

## 2015-03-15 DIAGNOSIS — I429 Cardiomyopathy, unspecified: Secondary | ICD-10-CM

## 2015-03-15 DIAGNOSIS — I6931 Cognitive deficits following cerebral infarction: Secondary | ICD-10-CM

## 2015-03-15 DIAGNOSIS — G4700 Insomnia, unspecified: Secondary | ICD-10-CM

## 2015-03-15 DIAGNOSIS — Z8673 Personal history of transient ischemic attack (TIA), and cerebral infarction without residual deficits: Secondary | ICD-10-CM | POA: Diagnosis not present

## 2015-03-15 DIAGNOSIS — I63521 Cerebral infarction due to unspecified occlusion or stenosis of right anterior cerebral artery: Secondary | ICD-10-CM | POA: Diagnosis not present

## 2015-03-15 DIAGNOSIS — Z87891 Personal history of nicotine dependence: Secondary | ICD-10-CM | POA: Insufficient documentation

## 2015-03-15 DIAGNOSIS — F329 Major depressive disorder, single episode, unspecified: Secondary | ICD-10-CM

## 2015-03-15 DIAGNOSIS — I480 Paroxysmal atrial fibrillation: Secondary | ICD-10-CM

## 2015-03-15 DIAGNOSIS — I6350 Cerebral infarction due to unspecified occlusion or stenosis of unspecified cerebral artery: Secondary | ICD-10-CM

## 2015-03-15 DIAGNOSIS — I69319 Unspecified symptoms and signs involving cognitive functions following cerebral infarction: Secondary | ICD-10-CM

## 2015-03-15 DIAGNOSIS — IMO0002 Reserved for concepts with insufficient information to code with codable children: Secondary | ICD-10-CM | POA: Insufficient documentation

## 2015-03-15 LAB — COMPLETE METABOLIC PANEL WITH GFR
ALBUMIN: 3.8 g/dL (ref 3.6–5.1)
ALK PHOS: 78 U/L (ref 40–115)
ALT: 16 U/L (ref 9–46)
AST: 21 U/L (ref 10–40)
BUN: 12 mg/dL (ref 7–25)
CALCIUM: 9.1 mg/dL (ref 8.6–10.3)
CHLORIDE: 107 mmol/L (ref 98–110)
CO2: 26 mmol/L (ref 20–31)
Creat: 1.11 mg/dL (ref 0.60–1.35)
GFR, Est African American: >89 mL/min (ref >=60)
GFR, Est Non African American: 79 mL/min (ref >=60)
GLUCOSE: 74 mg/dL (ref 65–99)
Potassium: 4.2 mmol/L (ref 3.5–5.3)
Sodium: 140 mmol/L (ref 135–146)
Total Bilirubin: 0.7 mg/dL (ref 0.2–1.2)
Total Protein: 8.6 g/dL — ABNORMAL HIGH (ref 6.1–8.1)

## 2015-03-15 MED ORDER — TRAZODONE HCL 50 MG PO TABS: 50.0000 mg | ORAL_TABLET | Freq: Every evening | ORAL | Status: DC | PRN

## 2015-03-15 MED ORDER — APIXABAN 5 MG PO TABS: 5.0000 mg | ORAL_TABLET | Freq: Two times a day (BID) | ORAL | Status: DC

## 2015-03-15 MED ORDER — CARVEDILOL 25 MG PO TABS: 25.0000 mg | ORAL_TABLET | Freq: Two times a day (BID) | ORAL | Status: DC

## 2015-03-15 MED ORDER — ATORVASTATIN CALCIUM 40 MG PO TABS: 40.0000 mg | ORAL_TABLET | Freq: Every day | ORAL | Status: DC

## 2015-03-15 MED ORDER — LISINOPRIL 40 MG PO TABS: 40.0000 mg | ORAL_TABLET | Freq: Every day | ORAL | Status: DC

## 2015-03-15 MED ORDER — CARVEDILOL 12.5 MG PO TABS: 12.5000 mg | ORAL_TABLET | Freq: Two times a day (BID) | ORAL | Status: DC

## 2015-03-15 MED ORDER — AMLODIPINE BESYLATE 10 MG PO TABS: 10.0000 mg | ORAL_TABLET | Freq: Every day | ORAL | Status: DC

## 2015-03-15 NOTE — Assessment & Plan Note (Signed)
Feeling hopeless: Continue celexa and trazodone  Call Asher Muir to plan counse

## 2015-03-15 NOTE — Progress Notes (Signed)
   Subjective:    Patient ID: [name] Short, male    DOB: Sep 04, 1965, 49 y.o.   MRN: [id number] CC: HTN f/u  HPI 49 yo M  With recent CVA and L sided weakness presents for f/u  1. CHRONIC HYPERTENSION  Disease Monitoring  Blood pressure range: not checking   Chest pain: no   Dyspnea: no   Claudication: no   Medication compliance: yes  Medication Side Effects  Lightheadedness: no   Urinary frequency: no   Edema: no   2. Hypokalemia: noted incidentally. Patient not taking diuretics. Out of potassium supplements. ? Need for refill.   3. Feeling hopeless: feeling down. Taking celexa and trazodone for sleep. Reports SI last month. None now. Feeling down because he is unable to work and support his family and feels isolated like he has no one to talk with now. Also feels like a bother to his partner when he talks. His partner reports that he is no not a bother.     Social History  Substance Use Topics  . Smoking status: Former Smoker    Types: Cigars    Quit date: 10/31/2014  . Smokeless tobacco: Not on file     Comment: previously smoked cigarettes - quit ~ 2013.  Marland Kitchen Alcohol Use: 0.0 oz/week    0 Standard drinks or equivalent per week     Comment: Alcohol use stopped in April 2016.   Review of Systems  Constitutional: Negative for fever, chills, fatigue and unexpected weight change.  Eyes: Negative for visual disturbance.  Respiratory: Negative for cough and shortness of breath.   Cardiovascular: Negative for chest pain, palpitations and leg swelling.  Gastrointestinal: Negative for nausea, vomiting, abdominal pain, diarrhea, constipation and blood in stool.  Endocrine: Negative for polydipsia, polyphagia and polyuria.  Musculoskeletal: Negative for myalgias, back pain, arthralgias, gait problem and neck pain.  Skin: Negative for rash.  Allergic/Immunologic: Negative for immunocompromised state.  Hematological: Negative for adenopathy. Does not bruise/bleed easily.    Psychiatric/Behavioral: Negative for suicidal ideas, sleep disturbance and dysphoric mood. The patient is not nervous/anxious.        Objective:   Physical Exam  Constitutional: He appears well-developed and well-nourished. No distress.  HENT:  Head: Normocephalic and atraumatic.  Neck: Normal range of motion. Neck supple.  Cardiovascular: Normal rate, regular rhythm, normal heart sounds and intact distal pulses.   Pulmonary/Chest: Effort normal and breath sounds normal.  Musculoskeletal: He exhibits no edema.  Neurological: He is alert.  Skin: Skin is warm and dry. No rash noted. No erythema.  Psychiatric: He has a normal mood and affect.  BP 153/100 mmHg  Pulse 64  Temp(Src) 98.5 F (36.9 C) (Oral)  Resp 16  Ht  (1.778 m)  Wt 180 lb (81.647 kg)  BMI 25.83 kg/m2  SpO2 100%  BP Readings from Last 3 Encounters:  03/15/15 153/100  02/18/15 145/85  02/15/15 126/81        Assessment & Plan:

## 2015-03-15 NOTE — Progress Notes (Signed)
F/U Stroke  Stated doing "fine" today Medication refill Took BP medication after 08:00 this morning  No Hx tobacco

## 2015-03-15 NOTE — Assessment & Plan Note (Signed)
Recheck BMP today  Suspect patient does not need continued supplementation

## 2015-03-15 NOTE — Assessment & Plan Note (Signed)
1. HTN: Goal BP < 140/90 at all times Increase lisinopril to 40 mg daily  Continue norvasc 10 Coreg 12.5 twice daily

## 2015-03-15 NOTE — Patient Instructions (Addendum)
Medication Instructions:  Please increase your carvedilol to 25 mg twice a day. Continue all other medications as listed.  Follow-Up: Follow up in 4 months with Dr Anne Fu.  Thank you for choosing [facility]!!

## 2015-03-15 NOTE — Progress Notes (Signed)
Cardiology Office Note   Date:  03/15/2015   ID:  [name] Short, DOB 10/23/1965, MRN [id number]  PCP:  [name], MD  Cardiologist:  Seen by Dr. Hillis Range in the hospital >> now [name], MD   No chief complaint on file.    History of Present Illness: [name] Short is a 49 y.o. male with a hx of untreated HTN, marijuana use who was admitted 10/2014 with an acute R brain CVA.  He was treated with tPA.  He continued to have L sided weakness.  He had an echo that demonstrated EF 35-40%. After medical therapy, his ejection fraction is now 55-60% in July 2016.  TEE did not demonstrate embolic source.    Renal artery [place] was neg for RAS.  DCM was felt to be related to HTN heart disease because of his response to antihypertensives.      He returns for FU.  He is here today with his wife.  He is doing fairly well.  His speech remains impaired.  His L leg is still weak.  He is working with HHPT/HHOT. He is no longer using wheelchair.  He feels like he is doing better.  He denies chest pain, significant dyspnea, orthopnea, PND, edema.  No syncope.   Studies/Reports Reviewed Today:  Renal Art [place] 11/04/14 - No evidence of renal artery stenosis noted bilaterally. - Bilateral normal intrarenal resistive indices.  TEE 11/03/14 Findings: Please see echo section for full report. Normal left ventricular size with moderate LV hypertrophy. Diffuse hypokinesis with EF 35%. Normal RV size with mildly decreased systolic function. Trivial mitral regurgitation. Trivial tricuspid regurgitation. Trileaflet aortic valve with no stenosis and mild regurgitation. Mildly dilated left atrium, no LAA thrombus but there was smoke in the appendage. Normal caliber aorta with mild plaque. Negative bubble study, no ASD or PFO.  No definite source of embolus. EF 35% with moderate LVH and smoke but no thrombus in LA appendage  Echo 10/31/14 - Left ventricle: The cavity size was mildly dilated.  Wall thickness was increased in a pattern of severe LVH. Systolic function was moderately reduced. EF 35% to 40%. Diffuse hypokinesis. Doppler parameters are consistent with elevated ventricular end-diastolic filling pressure. - Left atrium: The atrium was moderately dilated. - Atrial septum: No defect or patent foramen ovale was identified. - Pulmonary arteries: PA peak pressure: 45 mm Hg (S). - Impressions: In the absence of HTN or renal failure cannot r/o amyloid.  Echocardiogram 02/01/15-ejection fraction 55-60%  Carotid US 11/01/14 Bilateral: 1-39% ICA stenosis. Vertebral artery flow is antegrade.   Past Medical History  Diagnosis Date  . Hypertension     a. Previously on meds but none in several years.  . Smoker     a. Previous tobacco - quit ~ 2013. Ongoing daily marijuana usage. Occasional cigar.  . Stroke     a. 10/2014 MRI Head: acute mod size nonhemorrhagic R ant cerebral artery distribution infarct involving the medial aspect of the right frontal and parietal lobe and right aspect of the corpus callosum w/ local mass effect-->TPA;  b. 10/2014 MRA: Abrupt cut off of the R ACA A2 segment;  b. 10/2014 Carotid U/S: 1-39% bilat ICA stenosis.  . Cardiomyopathy     a. 10/2014 Echo: EF 35-40%, diff HK, sev LVH, mod dil LA, PASP .;  b.  Echo 7/16: Severe LVH, EF 55-60%, aortic sclerosis without stenosis, mild AI, trivial MR, severe BAE    Past Surgical History  Procedure Laterality Date  .  Tee without cardioversion N/A 11/03/2014    Procedure: TRANSESOPHAGEAL ECHOCARDIOGRAM (TEE);  Surgeon: Laurey Morale, MD;  Location: [facility] ENDOSCOPY;  Service: Cardiovascular;  Laterality: N/A;     Current Outpatient Prescriptions  Medication Sig Dispense Refill  . amLODipine (NORVASC) 10 MG tablet Take 1 tablet (10 mg total) by mouth daily. 30 tablet 1  . apixaban (ELIQUIS) 5 MG TABS tablet Take 1 tablet (5 mg total) by mouth 2 (two) times daily. 60 tablet 6  . atorvastatin (LIPITOR) 40  MG tablet Take 1 tablet (40 mg total) by mouth daily at 6 PM. 30 tablet 2  . carvedilol (COREG) 12.5 MG tablet Take 1 tablet (12.5 mg total) by mouth 2 (two) times daily with a meal. 60 tablet 2  . citalopram (CELEXA) 20 MG tablet Take 1 tablet (20 mg total) by mouth daily. 30 tablet 11  . diclofenac sodium (VOLTAREN) 1 % GEL Apply 2 g topically 4 (four) times daily. 1 Tube 1  . lisinopril (PRINIVIL,ZESTRIL) 40 MG tablet Take 1 tablet (40 mg total) by mouth daily. 30 tablet 5  . methylphenidate (RITALIN) 10 MG tablet Take 1 tablet (10 mg total) by mouth 2 (two) times daily with breakfast and lunch. 60 tablet 0  . potassium chloride SA (K-DUR,KLOR-CON) 20 MEQ tablet Take 2 tablets (40 mEq total) by mouth daily. On first day, take 4 tablets (total dose = 80 mEq), thereafter take 2 tablets (40 mEq dose) daily. 186 tablet 1  . traZODone (DESYREL) 50 MG tablet Take 1 tablet (50 mg total) by mouth at bedtime as needed for sleep. 30 tablet 0   No current facility-administered medications for this visit.    Allergies:   Review of patient's allergies indicates no known allergies.    Social History:  The patient  reports that he quit smoking about 4 months ago. His smoking use included Cigars. He does not have any smokeless tobacco history on file. He reports that he drinks alcohol. He reports that he uses illicit drugs.   Family History:  The patient's family history includes Diabetes in his mother; Heart attack in his father; Heart disease in his father; Hypertension in his brother, father, and sister; Other in an other family member; Stroke in his sister.    ROS:   Please see the history of present illness.   Review of Systems  All other systems reviewed and are negative.    PHYSICAL EXAM: VS:  BP 168/100 mmHg  Pulse 72  Ht  (1.778 m)  Wt 179 lb (81.194 kg)  BMI 25.68 kg/m2    Wt Readings from Last 3 Encounters:  03/15/15 179 lb (81.194 kg)  03/15/15 180 lb (81.647 kg)  02/04/15  168 lb 8 oz (76.431 kg)     GEN: Well nourished, well developed, in no acute distressin a wheelchair  HEENT: normal Neck: no JVD,   no masses Cardiac:  Normal S1/S2, irregular irregular rhythm; no murmur , no rubs or gallops, no edema   Respiratory:  clear to auscultation bilaterally, no wheezing, rhonchi or rales. GI: soft, nontender, nondistended, + BS MS: no deformity or atrophy Skin: warm and dry  Neuro:  CNs II-XII intact, L leg weak Psych: Normal affect   EKG:  EKG prior AFib, HR 75, LAD, NSSTTW changes   Recent Labs: 11/06/2014: ALT 19 01/22/2015: BUN 17; Creatinine, Ser 1.10; Hemoglobin 11.4*; Platelets 176.0; Potassium 3.7; Sodium 138    Lipid Panel    Component Value Date/Time   CHOL  164 11/01/2014 0601   TRIG 72 11/01/2014 0601   HDL 28* 11/01/2014 0601   CHOLHDL 5.9 11/01/2014 0601   VLDL 14 11/01/2014 0601   LDLCALC 122* 11/01/2014 0601      ASSESSMENT AND PLAN:  Atrial Fibrillation:  This patients CHA2DS2-VASc Score and unadjusted Ischemic Stroke Rate (% per year) is equal to 4.8 % stroke rate/year from a score of 4.  Above score calculated as 1 point each if present [CHF, HTN, DM, Vascular=MI/PAD/Aortic Plaque, Age if 65-74, or Male].  Above score calculated as 2 points each if present [Age > 75, or Stroke/TIA/TE].  He needs to start on oral anticoagulation for stroke prophylaxis.   At this point, he is fairly asymptomatic. I am not certain that we need to pursue rhythm control at this point in time. His rate is controlled.  -  Stopped Plavix and aspirin previously  -  Started Eliquis 5 mg twice a day (age less than 80, creatinine less than 1.5, weight greater than 60 kg)   Dilated cardiomyopathy-resolved:  Probably related to untreated HTN given the fact that his ejection fraction has improved back to 55% after treatment of blood pressure.  No source of embolus on TEE.  Continue beta-blocker, ACE inhibitor.  He is not having any symptoms to suggest ischemia, I  will hold off on proceeding with stress testing.  Hypertensive heart disease without heart failure:  Today Dr. Armen Pickup increase lisinopril to 40 mg. I will second that with increasing carvedilol to 25 mg twice a day.   Hyperlipidemia LDL goal <70:  Continue statin.    History of stroke:  As he is in atrial fibrillation today, he does not need an event monitor. Anticoagulation as discussed above. Follow-up with neurology and rehabilitation as planned.    Cigarette smoker:  He has quit smoking.   Current medicines are reviewed at length with the patient today.  Concerns regarding medicines are as outlined above.  The following changes have been made:  Increased carvedilol to 25 mg twice a day    Labs/ tests ordered today include:  No orders of the defined types were placed in this encounter.     He will come back and see Lorin Picket or myself in 4 months.  [name], MD

## 2015-03-15 NOTE — Patient Instructions (Signed)
Mr. Poplaski,  Thank you for coming in today  1. HTN: Goal BP < 140/90 at all times Increase lisinopril to 40 mg daily  Continue norvasc 10 Coreg 12.5 twice daily   2. Low potassium: Rechecking level  3. Pain and bruising behind leg brace: Place barrier to cushion your calf  4. Feeling hopeless: Continue celexa Call Jamie to plan counseling   F/u in 2-3 weeks with RN for BP check F/u with me in 2 months   Dr. Armen Pickup

## 2015-03-16 ENCOUNTER — Ambulatory Visit: Payer: Medicaid Other | Admitting: Physical Therapy

## 2015-03-16 ENCOUNTER — Ambulatory Visit: Payer: Medicaid Other | Admitting: Occupational Therapy

## 2015-03-16 DIAGNOSIS — R2681 Unsteadiness on feet: Secondary | ICD-10-CM

## 2015-03-16 DIAGNOSIS — R278 Other lack of coordination: Secondary | ICD-10-CM

## 2015-03-16 DIAGNOSIS — R269 Unspecified abnormalities of gait and mobility: Principal | ICD-10-CM

## 2015-03-16 DIAGNOSIS — I69359 Hemiplegia and hemiparesis following cerebral infarction affecting unspecified side: Secondary | ICD-10-CM

## 2015-03-16 DIAGNOSIS — I69898 Other sequelae of other cerebrovascular disease: Secondary | ICD-10-CM | POA: Diagnosis not present

## 2015-03-16 DIAGNOSIS — R279 Unspecified lack of coordination: Secondary | ICD-10-CM

## 2015-03-16 DIAGNOSIS — I69398 Other sequelae of cerebral infarction: Secondary | ICD-10-CM

## 2015-03-16 DIAGNOSIS — I69319 Unspecified symptoms and signs involving cognitive functions following cerebral infarction: Secondary | ICD-10-CM

## 2015-03-16 DIAGNOSIS — IMO0002 Reserved for concepts with insufficient information to code with codable children: Secondary | ICD-10-CM

## 2015-03-16 LAB — CBC
HEMATOCRIT: 40.3 % (ref 39.0–52.0)
HEMOGLOBIN: 12.8 g/dL — AB (ref 13.0–17.0)
MCH: 29 pg (ref 26.0–34.0)
MCHC: 31.8 g/dL (ref 30.0–36.0)
MCV: 91.4 fL (ref 78.0–100.0)
MPV: 10.8 fL (ref 8.6–12.4)
Platelets: 176 10*3/uL (ref 150–400)
RBC: 4.41 MIL/uL (ref 4.22–5.81)
RDW: 14.4 % (ref 11.5–15.5)
WBC: 4.5 10*3/uL (ref 4.0–10.5)

## 2015-03-16 NOTE — Patient Instructions (Signed)
Sit and practice the following with your left hand 10-20 mins per day: Unscrew nuts and bolts Toss a ball between your hands Put pick up 5 coins and push out of your hand 1 at a time Make a stack of 5 pennies Practice pushing cards off the top of deck with the fingers of left hand

## 2015-03-16 NOTE — Therapy (Signed)
[facility] [address] [place], [place], [address] Phone: [phone]   Fax:  [phone]  Occupational Therapy Treatment  Patient Details  Name: [name] Short MRN: [id number] Date of Birth: [birth year] Referring Provider:  Boykin Nearing, MD  Encounter Date: 03/16/2015      OT End of Session - 03/16/15 1318    Visit Number 9   Number of Visits 12   Authorization Type Medicaid   Authorization - Visit Number 9   Authorization - Number of Visits 12   OT Start Time 1497   OT Stop Time 1400   OT Time Calculation (min) 43 min   Activity Tolerance Patient tolerated treatment well   Behavior During Therapy [facility] for tasks assessed/performed      Past Medical History  Diagnosis Date  . Hypertension     a. Previously on meds but none in several years.  . Smoker     a. Previous tobacco - quit ~ 2013. Ongoing daily marijuana usage. Occasional cigar.  . Stroke     a. 10/2014 MRI Head: acute mod size nonhemorrhagic R ant cerebral artery distribution infarct involving the medial aspect of the right frontal and parietal lobe and right aspect of the corpus callosum w/ local mass effect-->TPA;  b. 10/2014 MRA: Abrupt cut off of the R ACA A2 segment;  b. 10/2014 Carotid U/S: 1-39% bilat ICA stenosis.  . Cardiomyopathy     a. 10/2014 Echo: EF 35-40%, diff HK, sev LVH, mod dil LA, PASP 88mHg.;  b.  Echo 7/16: Severe LVH, EF 55-60%, aortic sclerosis without stenosis, mild AI, trivial MR, severe BAE    Past Surgical History  Procedure Laterality Date  . Tee without cardioversion N/A 11/03/2014    Procedure: TRANSESOPHAGEAL ECHOCARDIOGRAM (TEE);  Surgeon: DLarey Dresser MD;  Location: [facility]  Service: Cardiovascular;  Laterality: N/A;    There were no vitals filed for this visit.  Visit Diagnosis:  Hemiparesis affecting nondominant side as late effect of stroke  Weakness due to cerebrovascular accident  Decreased  coordination  Cognitive deficit due to recent cerebrovascular accident      Subjective Assessment - 03/16/15 1318    Subjective  Denies pain   Patient is accompained by: Family member   Patient Stated Goals Improve use of left hand, return to independence with ADL/IADL   Currently in Pain? No/denies       Treatment: Pt / girlfriend were educated regarding HEP for LUE functional use. Pt returned demonstration with min v.c. Pt practiced removing bolts from bolt board with LUE, unscrewing bolts, min v.c., Pt placed grooved pegs in pegboard with LUE, mod difficulty, removing with only min-mod difficulty, v.c. For in hand manipulation.  Therapist checked progress towards goals. Pt can benefit from continued occupational therapy to maximize LUE functional use and balance for ADLs.                       OT Education - 03/16/15 1353    Education provided Yes   Education Details coordination/ functional use of LUE   Person(s) Educated Patient;Caregiver(s)   Methods Explanation;Demonstration;Verbal cues   Comprehension Verbalized understanding;Returned demonstration;Verbal cues required          OT Short Term Goals - 03/12/15 1726    OT SHORT TERM GOAL #1   Title Patient will don pants with no more thanmin assist   Status Achieved   OT SHORT TERM GOAL #2   Title Patient will  don socks and shoes with set up assistance and increased time   Status Achieved   OT SHORT TERM GOAL #3   Title Patient will stand at sink to complete hygiene task with no grater than initial min assist and then close supervision   Status Achieved   OT [place] #4   Title Patient will transfer to shower using tub transfer bench and close supervision   Status Achieved           OT Long Term Goals - 03/16/15 [name] Short #1   Title Patient will dress himself with modified independence   Baseline not met, min A- 03/15/15   Status On-going   OT LONG TERM GOAL #2    Title Patient will toilet himself with supervision (includes transfers, hygiene and clothing management)   Status Achieved   OT LONG TERM GOAL #3   Title Patient will prepare simple meal for himself (cold) with supervision, incorporating standing in part   Status Achieved   OT LONG TERM GOAL #4   Title Patient will shower himself with supervision(include transfer and bathing)   Status Achieved               Plan - 03/16/15 1354    Clinical Impression Statement Pt demonstrates progress towards goals with improved LUE functional use and ADL performance   Pt will benefit from skilled therapeutic intervention in order to improve on the following deficits (Retired) Decreased balance;Decreased cognition;Decreased coordination;Decreased safety awareness;Impaired sensation;Impaired tone;Impaired UE functional use;Decreased strength;Impaired perceived functional ability;Impaired vision/preception;Decreased knowledge of use of DME;Decreased range of motion   Rehab Potential Good   OT Frequency 1x / week   OT Duration 12 weeks   OT Treatment/Interventions Self-care/ADL training;Therapeutic exercise;Neuromuscular education;Functional Mobility Training;Passive range of motion;Therapeutic activities;Cognitive remediation/compensation;Visual/perceptual remediation/compensation;DME and/or AE instruction;Patient/family education;Balance training   Plan continue to address LUE functional use, standing balance, ADLs   Consulted and Agree with Plan of Care Patient;Family member/caregiver   Family Member Consulted girlfriend- [name] Short        Problem List Patient Active Problem List   Diagnosis Date Noted  . Depression due to stroke 03/15/2015  . Gait disturbance, post-stroke 01/19/2015  . Atrial fibrillation 12/25/2014  . Hypertensive heart disease 12/15/2014  . Cardiomyopathy 12/08/2014  . Insomnia 12/07/2014  . Essential hypertension 12/07/2014  . Secondary cardiomyopathy 11/05/2014  .  Malignant hypertension 11/05/2014  . Hyperlipidemia LDL goal <70 11/05/2014  . Cigarette smoker 11/05/2014  . Marijuana abuse 11/05/2014  . Hypokalemia 11/05/2014  . Left hemiparesis 11/05/2014  . Cognitive deficit, post-stroke 11/05/2014  . Left-sided weakness   . Acute right anterior cerebral artery (ACA) ischemic stroke 10/31/2014    [name] Short 03/16/2015, 1:57 PM Theone Murdoch, OTR/L [phone] Phone: [phone] 1:57 PM 03/16/2015 [facility] [address] [place], [place], [address] Phone: [phone]   Fax:  [phone]

## 2015-03-16 NOTE — Therapy (Signed)
[facility] [address] [place], [place], [address] Phone: [phone]   Fax:  [phone]  Physical Therapy Treatment  Patient Details  Name: [name] Short MRN: [id number] Date of Birth: May 01, 1966 Referring Provider:  Boykin Nearing, MD  Encounter Date: 03/16/2015      PT End of Session - 03/16/15 2018    Visit Number 12   Number of Visits 13   Date for PT Re-Evaluation 04/08/15   Authorization Type Medicaid   Authorization Time Period 7/20 - 03/30/15   PT Start Time 1401   PT Stop Time 1441   PT Time Calculation (min) 40 min   Equipment Utilized During Treatment Gait belt   Activity Tolerance Patient tolerated treatment well   Behavior During Therapy [facility] for tasks assessed/performed      Past Medical History  Diagnosis Date  . Hypertension     a. Previously on meds but none in several years.  . Smoker     a. Previous tobacco - quit ~ 2013. Ongoing daily marijuana usage. Occasional cigar.  . Stroke     a. 10/2014 MRI Head: acute mod size nonhemorrhagic R ant cerebral artery distribution infarct involving the medial aspect of the right frontal and parietal lobe and right aspect of the corpus callosum w/ local mass effect-->TPA;  b. 10/2014 MRA: Abrupt cut off of the R ACA A2 segment;  b. 10/2014 Carotid U/S: 1-39% bilat ICA stenosis.  . Cardiomyopathy     a. 10/2014 Echo: EF 35-40%, diff HK, sev LVH, mod dil LA, PASP 49mHg.;  b.  Echo 7/16: Severe LVH, EF 55-60%, aortic sclerosis without stenosis, mild AI, trivial MR, severe BAE    Past Surgical History  Procedure Laterality Date  . Tee without cardioversion N/A 11/03/2014    Procedure: TRANSESOPHAGEAL ECHOCARDIOGRAM (TEE);  Surgeon: DLarey Dresser MD;  Location: [facility]  Service: Cardiovascular;  Laterality: N/A;    There were no vitals filed for this visit.  Visit Diagnosis:  Abnormality of gait following cerebrovascular accident  Unsteadiness on  feet  Hemiparesis affecting nondominant side as late effect of stroke      Subjective Assessment - 03/16/15 2008    Subjective Pt/spouse deny falls. Pt reports no pain. Reporting rash on L calf, which pt/spouse believe to be caused by L custom AFO.   Patient is accompained by: Family member   Pertinent History HTN, A-Fib, cognitive impairments   Patient Stated Goals pt wishes to be able to walk again.    Currently in Pain? No/denies                         [facility] Adult PT Treatment/Exercise - 03/16/15 0001    Bed Mobility   Bed Mobility Sit to Supine;Supine to Sit   Supine to Sit 5: Supervision;6: Modified independent (Device/Increase time)   Supine to Sit Details (indicate cue type and reason) During initial trial, required (S), cueing for LLE management with effective within-session carryover.   Sit to Supine 5: Supervision;6: Modified independent (Device/Increase time)   Sit to Supine - Details (indicate cue type and reason) During initial trial, required (S), cueing for LLE management with effective within-session carryover   Transfers   Transfers Sit to Stand;Stand to Sit  blocked practice with/without UE use   Sit to Stand 6: Modified independent (Device/Increase time)   Stand to Sit 6: Modified independent (Device/Increase time)   Ambulation/Gait   Ambulation/Gait Yes   Ambulation/Gait  Assistance 6: Modified independent (Device/Increase time);5: Supervision;4: Min guard   Ambulation/Gait Assistance Details Mod I for linear gait with RW. Pt required cueing to maintain walker in contact with ground when turning. (S) required to ambulate with SBQC, Min guard to ambulate without AD.   Ambulation Distance (Feet) 650 Feet  345' total without AD; x150'' with RW   Assistive device Rolling walker;None;Small based quad cane  L AFO   Gait Pattern Decreased hip/knee flexion - left;Decreased step length - left;Trunk rotated posteriorly on left;Step-through pattern;Left  foot flat  LLE adduction/internal rotation throughout   Ambulation Surface Level;Indoor   Stairs Yes   Stairs Assistance 4: Min assist   Stair Management Technique Step to pattern;Forwards;No rails;With cane   Number of Stairs 4   Height of Stairs 6   Ramp 5: Supervision   Ramp Details (indicate cue type and reason) x2 trials with RW   Curb 5: Supervision   Curb Details (indicate cue type and reason) x2 trials with RW   Gait Comments Negotiated 4 stairs without rails using SBQC with min A, 50% cueing for sequencing, technique. Recommended pt have B HHA of 2 persons to negotiate stairs wiithout rails.   Posture/Postural Control   Posture/Postural Control Postural limitations   Postural Limitations Rounded Shoulders;Forward head;Increased thoracic kyphosis;Posterior pelvic tilt  seated   Posture Comments Improved awareness of seated posture during this session, as exhibited by self-correction of posture during ~25% of session without cueing from this PT.   Self-Care   Self-Care Other Self-Care Comments   Other Self-Care Comments  Discussed CVA warning signs, risk factors. Pt verbalized understanding of 3 of 4 warning signs and risk factors pertinent to pt.   Neuro Re-ed    Neuro Re-ed Details  --                PT Education - 03/16/15 2009    Education provided Yes   Education Details Discussed CVA risk factors, warning signs. Recommended HHA of 2 persons if pt needs to negotiate stairs without rails.   Person(s) Educated Patient;Spouse   Methods Explanation;Demonstration   Comprehension Verbalized understanding          PT Short Term Goals - 02/04/15 1731    PT SHORT TERM GOAL #1   Title The patient will perform HEP with supervision from family for safety. Target Date July 12th.   Baseline Pt dependent for HEP.   Status Achieved   PT SHORT TERM GOAL #2   Title The patient will move sit<>stand with SBA to demo improved functional independence. Target Date July 12th.    Baseline Pt requires Min A for sit<>stand.   Status Achieved   PT SHORT TERM GOAL #3   Title The patient will ambulate with LRAD and mod A x 50 feet on indoor level surface. Target Date July 12th.    Baseline Achieved 01/22/15, as pt ambulated x60' with RW and min A.   Status Achieved   PT SHORT TERM GOAL #4   Title Pt will improve PASS score from 19/36 to 21/36 to demo improved functional mobility. Target Date July 12th.    Baseline PASS score: 19/36.   Status Achieved  25/36 on 02/04/15           PT Long Term Goals - 03/16/15 2019    PT LONG TERM GOAL #1   Title Pt will verbalize understanding of stroke risk factors and warning signs. Modified target date: 03/19/15.   Baseline Met 8/23.  Status Achieved   PT LONG TERM GOAL #2   Title pt will improve PASS score from 19/36 to 23/36 to demo improved functional mobility. Target date: 03/02/15.   Baseline PASS score: 19/36.   Status Achieved  25/36 on 02/04/15.   PT LONG TERM GOAL #3   Title The patient will ambulate over level surfaces with LRAD and CGA x 75 feet for improved household ambulation.Target date: 03/02/15   Status Achieved  Met 02/04/15.   PT LONG TERM GOAL #4   Title Pt will ambulate x200' over level, indoor surfaces with LRAD and mod I to indicate progress toward safety with household ambulation. Target date: 03/19/15.   Baseline 8/23: Mod I for linear gait. Supervision, cueing to maintain RW in contact with ground during turning.   Status On-going   PT LONG TERM GOAL #5   Title Pt will ambulate x300' over unlevel surfaces with LRAD and supervision to indicate ability to ambulate outside of home when accompanied by girlfriend. Target date: 03/19/15.   Baseline 115' over level, indoor surfaces with close supervision during linear gait, min guard during turns with RW and AFO.   Status On-going   PT LONG TERM GOAL #6   Title Pt will negotiate standard curb with LRAD requiring CGA and 25% cueing to indicate ability to  negotiate curbs in community. Target date: 03/19/15.   Baseline Met 8/23.   Status Achieved   PT LONG TERM GOAL #7   Title Patient will perform supine <> sit with mod I, increased time to both right and left sides to indicate increased safety/independence getting into/out of bed and on/off of couch. Target date: 03/19/15.   Baseline 8/23: supervision/cueing for LLE management.   Status On-going               Problem List Patient Active Problem List   Diagnosis Date Noted  . Depression due to stroke 03/15/2015  . Gait disturbance, post-stroke 01/19/2015  . Atrial fibrillation 12/25/2014  . Hypertensive heart disease 12/15/2014  . Cardiomyopathy 12/08/2014  . Insomnia 12/07/2014  . Essential hypertension 12/07/2014  . Secondary cardiomyopathy 11/05/2014  . Malignant hypertension 11/05/2014  . Hyperlipidemia LDL goal <70 11/05/2014  . Cigarette smoker 11/05/2014  . Marijuana abuse 11/05/2014  . Hypokalemia 11/05/2014  . Left hemiparesis 11/05/2014  . Cognitive deficit, post-stroke 11/05/2014  . Left-sided weakness   . Acute right anterior cerebral artery (ACA) ischemic stroke 10/31/2014   Billie Ruddy, PT, DPT [facility] [address] [place], [place], [address] Phone: [phone]   Fax:  [phone] 03/16/2015, 8:27 PM

## 2015-03-22 ENCOUNTER — Other Ambulatory Visit: Payer: Self-pay | Admitting: Family Medicine

## 2015-03-23 ENCOUNTER — Encounter: Payer: Medicaid Other | Attending: Physical Medicine & Rehabilitation

## 2015-03-23 ENCOUNTER — Ambulatory Visit (HOSPITAL_BASED_OUTPATIENT_CLINIC_OR_DEPARTMENT_OTHER): Payer: Medicaid Other | Admitting: Physical Medicine & Rehabilitation

## 2015-03-23 ENCOUNTER — Encounter: Payer: Self-pay | Admitting: Physical Medicine & Rehabilitation

## 2015-03-23 VITALS — BP 152/100 | HR 71

## 2015-03-23 DIAGNOSIS — R269 Unspecified abnormalities of gait and mobility: Secondary | ICD-10-CM

## 2015-03-23 DIAGNOSIS — G8190 Hemiplegia, unspecified affecting unspecified side: Secondary | ICD-10-CM | POA: Insufficient documentation

## 2015-03-23 DIAGNOSIS — I63521 Cerebral infarction due to unspecified occlusion or stenosis of right anterior cerebral artery: Secondary | ICD-10-CM | POA: Diagnosis not present

## 2015-03-23 DIAGNOSIS — I69398 Other sequelae of cerebral infarction: Secondary | ICD-10-CM | POA: Diagnosis not present

## 2015-03-23 DIAGNOSIS — I6931 Cognitive deficits following cerebral infarction: Secondary | ICD-10-CM | POA: Insufficient documentation

## 2015-03-23 DIAGNOSIS — I69319 Unspecified symptoms and signs involving cognitive functions following cerebral infarction: Secondary | ICD-10-CM

## 2015-03-23 DIAGNOSIS — G8194 Hemiplegia, unspecified affecting left nondominant side: Secondary | ICD-10-CM

## 2015-03-23 MED ORDER — METHYLPHENIDATE HCL 10 MG PO TABS: 10.0000 mg | ORAL_TABLET | Freq: Two times a day (BID) | ORAL | Status: DC

## 2015-03-23 NOTE — Progress Notes (Signed)
Subjective:    Patient ID: [name] Short, male    DOB: [birth year], 49 y.o.   MRN: [id number]  HPI Still using a walker for ambulation outside the home. Using a cane within the home. Just starting outpatient therapy Continues have problems with attention and concentration, is on Ritalin 10 mg twice a day. 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 General activity 0 Relation with others 0 Enjoyment of life 0 What TIME of day is your pain at its worst? no pain Sleep (in general) Good  Pain is worse with: no pain Pain improves with: no pain Relief from Meds: no pain  Mobility use a walker  Function I need assistance with the following:  meal prep, household duties and shopping  Neuro/Psych weakness trouble walking spasms  Prior Studies Any changes since last visit?  no  Physicians involved in your care Any changes since last visit?  no   Family History  Problem Relation Age of Onset  . Other      no premature cad  . Diabetes Mother   . Heart disease Father   . Heart attack Father   . Stroke Sister   . Hypertension Father   . Hypertension Brother   . Hypertension Sister    Social History   Social History  . Marital Status: Single    Spouse Name: N/A  . Number of Children: 3  . Years of Education: 12   Occupational History  . Unemployed    Social History Main Topics  . Smoking status: Former Smoker    Types: Cigars    Quit date: 10/31/2014  . Smokeless tobacco: None     Comment: previously smoked cigarettes - quit ~ 2013.  Marland Kitchen Alcohol Use: 0.0 oz/week    0 Standard drinks or  equivalent per week     Comment: Alcohol use stopped in April 2016.  . Drug Use: Yes     Comment: Marijuana daily. Has not had marijuana since being in hospital.  . Sexual Activity: Not Asked   Other Topics Concern  . None   Social History Narrative   Lives in [place] with his GF.  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   Right-handed.   Occasional caffeine use.   Past Surgical History  Procedure Laterality Date  . Tee without cardioversion N/A 11/03/2014    Procedure: TRANSESOPHAGEAL ECHOCARDIOGRAM (TEE);  Surgeon: Laurey Morale, MD;  Location: [facility] ENDOSCOPY;  Service: Cardiovascular;  Laterality: N/A;   Past Medical History  Diagnosis Date  . Hypertension     a. Previously on meds but none in several years.  . Smoker     a. Previous tobacco - quit ~ 2013. Ongoing daily marijuana usage. Occasional cigar.  . Stroke     a. 10/2014 MRI Head: acute mod size nonhemorrhagic R ant cerebral artery distribution infarct involving the medial aspect of the right frontal and parietal lobe and right aspect of the corpus callosum w/ local mass effect-->TPA;  b. 10/2014 MRA: Abrupt cut off  of the R ACA A2 segment;  b. 10/2014 Carotid U/S: 1-39% bilat ICA stenosis.  . Cardiomyopathy     a. 10/2014 Echo: EF 35-40%, diff HK, sev LVH, mod dil LA, PASP .;  b.  Echo 7/16: Severe LVH, EF 55-60%, aortic sclerosis without stenosis, mild AI, trivial MR, severe BAE   BP 152/100 mmHg  Pulse 71  SpO2 100%  Opioid Risk Score:   Fall Risk Score:  `1  Depression screen PHQ 2/9  Depression screen [facility] 2/9 03/23/2015 03/15/2015 02/18/2015 12/25/2014 12/24/2014 12/07/2014  Decreased Interest 0 0 0 0 0 3  Down, Depressed, Hopeless 0 0 0 0 0 0  PHQ - 2 Score 0 0 0 0 0 3  Altered sleeping - - - 0 - 3  Tired, decreased energy - - - 0 - 3  Change in appetite - - - 0 - 0  Feeling bad or failure about yourself  - - - 0 - 3  Trouble concentrating - - - 0 - 3  Moving slowly or fidgety/restless - - - 0 - 0  Suicidal  thoughts - - - 0 - 0  PHQ-9 Score - - - 0 - 15     Review of Systems  Musculoskeletal: Positive for gait problem.       Spasms  Neurological: Positive for weakness.  All other systems reviewed and are negative.      Objective:   Physical Exam  Constitutional: He appears well-developed and well-nourished.  HENT:  Head: Normocephalic and atraumatic.  Eyes: Conjunctivae and EOM are normal. Pupils are equal, round, and reactive to light.  Neck: Normal range of motion.  Neurological: He is alert. A sensory deficit is present. He exhibits abnormal muscle tone. Coordination and gait abnormal.  Motor strength is 4/5 in the left deltoid, biceps, triceps, grip 3 minus at the left hip flexor and knee extensor, 2 minus at the left ankle dorsiflexor.  Sensation reduced to light touch on the left side in the upper and lower limb  Increased tone in the left hamstrings Ashworth grade 2 Tone is relatively normal in the left upper extremity  Clonus at the left ankle  Decreased fine motor in the left upper extremity  Psychiatric: His affect is blunt. His speech is delayed. He is slowed. Cognition and memory are impaired.  Nursing note and vitals reviewed.         Assessment & Plan:  1. Right anterior cerebral artery distribution CVA with left hemiparesis affecting the lower extremity greater than the upper extremity Continue outpatient PT OT and speech No need for Botox injection at the current time Unable to work will complete FMLA form No driving   2.Decreased initiation, decreased attention and concentration related to right ACA stroke. He continues to benefit from methylphenidate. We'll continue for the next month and reassess at next visit

## 2015-03-25 ENCOUNTER — Encounter: Payer: Self-pay | Admitting: Occupational Therapy

## 2015-03-25 ENCOUNTER — Ambulatory Visit: Payer: Medicaid Other | Attending: Physical Medicine & Rehabilitation | Admitting: Physical Therapy

## 2015-03-25 ENCOUNTER — Ambulatory Visit: Payer: Medicaid Other | Admitting: Occupational Therapy

## 2015-03-25 DIAGNOSIS — R269 Unspecified abnormalities of gait and mobility: Secondary | ICD-10-CM | POA: Insufficient documentation

## 2015-03-25 DIAGNOSIS — R2681 Unsteadiness on feet: Secondary | ICD-10-CM

## 2015-03-25 DIAGNOSIS — IMO0002 Reserved for concepts with insufficient information to code with codable children: Secondary | ICD-10-CM

## 2015-03-25 DIAGNOSIS — I69859 Hemiplegia and hemiparesis following other cerebrovascular disease affecting unspecified side: Secondary | ICD-10-CM | POA: Insufficient documentation

## 2015-03-25 DIAGNOSIS — R531 Weakness: Secondary | ICD-10-CM | POA: Insufficient documentation

## 2015-03-25 DIAGNOSIS — I69898 Other sequelae of other cerebrovascular disease: Secondary | ICD-10-CM | POA: Diagnosis present

## 2015-03-25 DIAGNOSIS — I69998 Other sequelae following unspecified cerebrovascular disease: Secondary | ICD-10-CM | POA: Diagnosis present

## 2015-03-25 DIAGNOSIS — I69398 Other sequelae of cerebral infarction: Secondary | ICD-10-CM

## 2015-03-25 DIAGNOSIS — I69359 Hemiplegia and hemiparesis following cerebral infarction affecting unspecified side: Secondary | ICD-10-CM

## 2015-03-25 NOTE — Patient Instructions (Signed)
CAREGIVER ASSISTED: Ankle Dorsiflexion   [name]: cue Bless to bend left ankle upward as far as he can, then assist by lifting the ankle the rest of the way. Do at least 20 reps, 3-5 times per day.  Perform this left ankle exercise in all 4 directions.  Gastroc, Sitting (Passive)   Dontrey, when [name] is at work, you can perform this exercise by looping a sheet around your left foot (as pictured), using your muscles to bend your ankle upward as far as you can, then using the towel to help it bend the rest of the way.    Do at least 20 reps, 3-5 times per day.

## 2015-03-25 NOTE — Therapy (Signed)
[facility] [address] , [place], [address] Phone: [phone]   Fax:  [phone]  Physical Therapy Treatment  Patient Details  Name: [name] Short MRN: [id number] Date of Birth: 05/24/1966 Referring Provider:  Funches, Josalyn, MD  Encounter Date: 03/25/2015      PT End of Session - 03/25/15 1846    Visit Number 13   Number of Visits 13   Date for PT Re-Evaluation 04/08/15   Authorization Type Medicaid   Authorization Time Period 7/20 - 03/30/15   PT Start Time 1400   PT Stop Time 1445   PT Time Calculation (min) 45 min   Activity Tolerance Patient tolerated treatment well   Behavior During Therapy WFL for tasks assessed/performed      Past Medical History  Diagnosis Date  . Hypertension     a. Previously on meds but none in several years.  . Smoker     a. Previous tobacco - quit ~ 2013. Ongoing daily marijuana usage. Occasional cigar.  . Stroke     a. 10/2014 MRI Head: acute mod size nonhemorrhagic R ant cerebral artery distribution infarct involving the medial aspect of the right frontal and parietal lobe and right aspect of the corpus callosum w/ local mass effect-->TPA;  b. 10/2014 MRA: Abrupt cut off of the R ACA A2 segment;  b. 10/2014 Carotid U/S: 1-39% bilat ICA stenosis.  . Cardiomyopathy     a. 10/2014 Echo: EF 35-40%, diff HK, sev LVH, mod dil LA, PASP 45mmHg.;  b.  Echo 7/16: Severe LVH, EF 55-60%, aortic sclerosis without stenosis, mild AI, trivial MR, severe BAE    Past Surgical History  Procedure Laterality Date  . Tee without cardioversion N/A 11/03/2014    Procedure: TRANSESOPHAGEAL ECHOCARDIOGRAM (TEE);  Surgeon: Dalton S McLean, MD;  Location: [facility];  Service: Cardiovascular;  Laterality: N/A;    There were no vitals filed for this visit.  Visit Diagnosis:  Abnormality of gait following cerebrovascular accident  Unsteadiness on feet  Hemiparesis affecting nondominant side as  late effect of stroke                       [facility] Adult PT Treatment/Exercise - 03/25/15 0001    Bed Mobility   Bed Mobility Sit to Supine;Supine to Sit   Supine to Sit 6: Modified independent (Device/Increase time)   Supine to Sit Details (indicate cue type and reason) to both R and L side   Sit to Supine 6: Modified independent (Device/Increase time)   Sit to Supine - Details (indicate cue type and reason) to both R and L side   Transfers   Transfers Sit to Stand;Stand to Sit  blocked practice with/without UE use   Sit to Stand 6: Modified independent (Device/Increase time)   Stand to Sit 6: Modified independent (Device/Increase time)   Ambulation/Gait   Ambulation/Gait Yes   Ambulation/Gait Assistance 6: Modified independent (Device/Increase time);5: Supervision   Ambulation/Gait Assistance Details Mod I over level, indoor surfaces with RW; supervision over level, indoor surfaces without AD and over outdoor surfaces with RW.   Ambulation Distance (Feet) 700 Feet   Assistive device Rolling walker;None;Small based quad cane  L AFO   Gait Pattern Decreased hip/knee flexion - left;Decreased step length - left;Step-through pattern;Left foot flat  LLE internal rotation throughout   Ambulation Surface Level;Indoor;Outdoor;Paved;Unlevel   Posture/Postural Control   Posture/Postural Control Postural limitations   Postural Limitations Rounded Shoulders;Forward head;Increased thoracic kyphosis;Posterior pelvic   tilt  seated   Posture Comments --   Self-Care   Self-Care Discussed available community resources to continue exercise beyond D/C from therapies.   Other Self-Care Comments  --   Neuro Re-ed    Neuro Re-ed Details  Explained, demonstrated and provided paper handout for AAROM of L ankle (focus on dorsiflexion). See Pt Instructions for details.                PT Education - 03/25/15 1842    Education provided Yes   Education Details Goals, progress, and  D/C plan. HEP: added AAROM of L ankle dorsiflexion (caregiver-assisted and self-assisted with sheet).   Person(s) Educated Patient;Spouse   Methods Explanation;Demonstration;Handout;Verbal cues   Comprehension Verbalized understanding;Returned demonstration          PT Short Term Goals - 02/04/15 1731    PT SHORT TERM GOAL #1   Title The patient will perform HEP with supervision from family for safety. Target Date July 12th.   Baseline Pt dependent for HEP.   Status Achieved   PT SHORT TERM GOAL #2   Title The patient will move sit<>stand with SBA to demo improved functional independence. Target Date July 12th.   Baseline Pt requires Min A for sit<>stand.   Status Achieved   PT SHORT TERM GOAL #3   Title The patient will ambulate with LRAD and mod A x 50 feet on indoor level surface. Target Date July 12th.    Baseline Achieved 01/22/15, as pt ambulated x60' with RW and min A.   Status Achieved   PT SHORT TERM GOAL #4   Title Pt will improve PASS score from 19/36 to 21/36 to demo improved functional mobility. Target Date July 12th.    Baseline PASS score: 19/36.   Status Achieved  25/36 on 02/04/15           PT Long Term Goals - 03/25/15 1426    PT LONG TERM GOAL #1   Title Pt will verbalize understanding of stroke risk factors and warning signs. Modified target date: 03/19/15.   Baseline Met 8/23.   Status Achieved   PT LONG TERM GOAL #2   Title pt will improve PASS score from 19/36 to 23/36 to demo improved functional mobility. Target date: 03/02/15.   Baseline PASS score: 19/36.   Status Achieved  25/36 on 02/04/15.   PT LONG TERM GOAL #3   Title The patient will ambulate over level surfaces with LRAD and CGA x 75 feet for improved household ambulation.Target date: 03/02/15   Status Achieved  Met 02/04/15.   PT LONG TERM GOAL #4   Title Pt will ambulate x200' over level, indoor surfaces with LRAD and mod I to indicate progress toward safety with household ambulation. Target  date: 03/19/15.   Baseline 8/23: Mod I for linear gait. Supervision, cueing to maintain RW in contact with ground during turning.   Status Achieved   PT LONG TERM GOAL #5   Title Pt will ambulate x300' over unlevel surfaces with LRAD and supervision to indicate ability to ambulate outside of home when accompanied by girlfriend. Target date: 03/19/15.   Baseline 115' over level, indoor surfaces with close supervision during linear gait, min guard during turns with RW and AFO.   Status Achieved   PT LONG TERM GOAL #6   Title Pt will negotiate standard curb with LRAD requiring CGA and 25% cueing to indicate ability to negotiate curbs in community. Target date: 03/19/15.   Baseline Met 8/23.     Status Achieved   PT LONG TERM GOAL #7   Title Patient will perform supine <> sit with mod I, increased time to both right and left sides to indicate increased safety/independence getting into/out of bed and on/off of couch. Target date: 03/19/15.   Baseline 8/23: supervision/cueing for LLE management.   Status Achieved               Plan - 03/25/15 1850    Clinical Impression Statement Pt has met all short and long term goals and will therefore be discharged from outpatient PT at this time. Since beginning this episode of OPPT, pt has demonstrated significant improvement in postural control, awareness, stability and independence with all aspects of functional mobility, and decreased fall risk. Educated pt and spouse on goals, progress, and D/C plan. Pt and spouse verbalized understanding and were in full agreement.    Pt will benefit from skilled therapeutic intervention in order to improve on the following deficits Abnormal gait;Decreased coordination;Decreased range of motion;Difficulty walking;Impaired tone;Decreased safety awareness;Impaired UE functional use;Decreased balance;Decreased knowledge of use of DME;Impaired flexibility;Decreased strength;Decreased mobility;Decreased cognition;Impaired  sensation;Postural dysfunction;Decreased activity tolerance;Decreased endurance   Rehab Potential Good   Clinical Impairments Affecting Rehab Potential Cognitive impairments, L inattention   PT Frequency 1x / week   PT Duration 6 weeks   PT Treatment/Interventions Passive range of motion;Patient/family education;Orthotic Fit/Training;Wheelchair mobility training;Manual techniques;Neuromuscular re-education;Balance training;Therapeutic exercise;Therapeutic activities;Functional mobility training;DME Instruction;Gait training;Stair training;Electrical Stimulation;Biofeedback;ADLs/Self Care Home Management;Cognitive remediation   PT Next Visit Plan N/A, as patient is discharged from outpatient PT at this time.   Consulted and Agree with Plan of Care Patient;Family member/caregiver   Family Member Consulted Girlfriend, [name]        Problem List Patient Active Problem List   Diagnosis Date Noted  . Depression due to stroke 03/15/2015  . Gait disturbance, post-stroke 01/19/2015  . Atrial fibrillation 12/25/2014  . Hypertensive heart disease 12/15/2014  . Cardiomyopathy 12/08/2014  . Insomnia 12/07/2014  . Essential hypertension 12/07/2014  . Secondary cardiomyopathy 11/05/2014  . Malignant hypertension 11/05/2014  . Hyperlipidemia LDL goal <70 11/05/2014  . Cigarette smoker 11/05/2014  . Marijuana abuse 11/05/2014  . Hypokalemia 11/05/2014  . Left hemiparesis 11/05/2014  . Cognitive deficit, post-stroke 11/05/2014  . Left-sided weakness   . Acute right anterior cerebral artery (ACA) ischemic stroke 10/31/2014    PHYSICAL THERAPY DISCHARGE SUMMARY  Visits from Start of Care: 13   Current functional level related to goals / functional outcomes: See short and long term goals above.   Remaining deficits: Pt continues to demonstrate postural impairments, gait impairments, and decreased independence with functional mobility in community environments (unlevel surfaces, prolonged  distances).    Education / Equipment: HEP; CVA warning signs/risk factors pertinent to patient; techniques for negotiation of community obstacles; community resources.  Plan: Patient agrees to discharge.  Patient goals were met. Patient is being discharged due to meeting the stated rehab goals.  ?????        Blair Hobble, PT, DPT [facility] [address] [place], [place], [address] Phone: [phone]   Fax:  [phone] 03/25/2015, 6:55 PM      

## 2015-03-25 NOTE — Therapy (Signed)
[name] [address] [place], [place], [address] Phone: [phone]   Fax:  [phone]  Occupational Therapy Treatment  Patient Details  Name: [name] Short MRN: [id number] Date of Birth: 1965-10-12 Referring Provider:  Dessa Phi, MD  Encounter Date: 03/25/2015      OT End of Session - 03/25/15 1559    Visit Number 10   Number of Visits 12   Authorization Type Medicaid   Authorization - Visit Number 10   Authorization - Number of Visits 12   OT Start Time 1448   OT Stop Time 1527   OT Time Calculation (min) 39 min   Activity Tolerance Patient tolerated treatment well   Behavior During Therapy [facility] for tasks assessed/performed      Past Medical History  Diagnosis Date  . Hypertension     a. Previously on meds but none in several years.  . Smoker     a. Previous tobacco - quit ~ 2013. Ongoing daily marijuana usage. Occasional cigar.  . Stroke     a. 10/2014 MRI Head: acute mod size nonhemorrhagic R ant cerebral artery distribution infarct involving the medial aspect of the right frontal and parietal lobe and right aspect of the corpus callosum w/ local mass effect-->TPA;  b. 10/2014 MRA: Abrupt cut off of the R ACA A2 segment;  b. 10/2014 Carotid U/S: 1-39% bilat ICA stenosis.  . Cardiomyopathy     a. 10/2014 Echo: EF 35-40%, diff HK, sev LVH, mod dil LA, PASP .;  b.  Echo 7/16: Severe LVH, EF 55-60%, aortic sclerosis without stenosis, mild AI, trivial MR, severe BAE    Past Surgical History  Procedure Laterality Date  . Tee without cardioversion N/A 11/03/2014    Procedure: TRANSESOPHAGEAL ECHOCARDIOGRAM (TEE);  Surgeon: Laurey Morale, MD;  Location: [facility] ENDOSCOPY;  Service: Cardiovascular;  Laterality: N/A;    There were no vitals filed for this visit.  Visit Diagnosis:  Hemiparesis affecting nondominant side as late effect of stroke  Weakness due to cerebrovascular accident  Unsteadiness on  feet      Subjective Assessment - 03/25/15 1530    Subjective  I cooked [place] wings and gravy for my family   Patient is accompained by: Family member   Pertinent History HTN   Patient Stated Goals Improve use of left hand, return to independence with ADL/IADL   Currently in Pain? No/denies   Pain Score 0-No pain                      OT Treatments/Exercises (OP) - 03/25/15 0001    ADLs   LB Dressing Patient and fiancee are reporting that patient now able to dress himself  including shoes, socks, brace, pants and belt without difficulty.   Toileting Patient reports modified independence   Bathing Patient is now  completing showering with modified independence.     Functional Mobility Patient has expressed a desire to address his ability to safely manage heavy doors when out in community.  Patient and fiancee are attempting more community outings - yet are closely managing time of outings, and crowds.  Patient has difficutly with walking endurance for community - and choose options which allow environment for seated rest breaks.Practiced safe management of heavy fire doors with walker, quad cane, and no device.  Patient actually with safest performance with no device.  Confirmed this was safe with PT, and reviewed with fiancee.     Cooking Patient reports that  he was able to prepare a complete dinner for his family since last visit.                  OT Education - 03/25/15 1556    Education provided Yes   Education Details Reviewed safety with Designer, multimedia) Educated Patient;Spouse   Methods Explanation;Demonstration   Comprehension Verbalized understanding;Returned demonstration          OT Short Term Goals - 03/12/15 1726    OT SHORT TERM GOAL #1   Title Patient will don pants with no more thanmin assist   Status Achieved   OT SHORT TERM GOAL #2   Title Patient will don socks and shoes with set up assistance and increased time   Status  Achieved   OT SHORT TERM GOAL #3   Title Patient will stand at sink to complete hygiene task with no grater than initial min assist and then close supervision   Status Achieved   OT SHORT TERM GOAL #4   Title Patient will transfer to shower using tub transfer bench and close supervision   Status Achieved           OT Long Term Goals - 03/25/15 1506    OT LONG TERM GOAL #7   Title (p) Patient will modified independently manage doors/doorframes with assistive device   Baseline (p) minimal assistance   Status (p) New               Plan - 03/25/15 1601    Clinical Impression Statement Patient is rapidly approaching all long term goals.  Discussed possibly discharging next visit.   Pt will benefit from skilled therapeutic intervention in order to improve on the following deficits (Retired) Decreased balance;Decreased cognition;Decreased coordination;Decreased safety awareness;Impaired sensation;Impaired tone;Impaired UE functional use;Decreased strength;Impaired perceived functional ability;Impaired vision/preception;Decreased knowledge of use of DME;Decreased range of motion   Rehab Potential Good   OT Frequency 1x / week   OT Duration 12 weeks   OT Treatment/Interventions Self-care/ADL training;Therapeutic exercise;Neuromuscular education;Functional Mobility Training;Passive range of motion;Therapeutic activities;Cognitive remediation/compensation;Visual/perceptual remediation/compensation;DME and/or AE instruction;Patient/family education;Balance training   Plan continue to address LUE functional use, standing balance, ADL's    Consulted and Agree with Plan of Care Patient;Family member/caregiver   Family Member Consulted girlfriend- [name]        Problem List Patient Active Problem List   Diagnosis Date Noted  . Depression due to stroke 03/15/2015  . Gait disturbance, post-stroke 01/19/2015  . Atrial fibrillation 12/25/2014  . Hypertensive heart disease 12/15/2014   . Cardiomyopathy 12/08/2014  . Insomnia 12/07/2014  . Essential hypertension 12/07/2014  . Secondary cardiomyopathy 11/05/2014  . Malignant hypertension 11/05/2014  . Hyperlipidemia LDL goal <70 11/05/2014  . Cigarette smoker 11/05/2014  . Marijuana abuse 11/05/2014  . Hypokalemia 11/05/2014  . Left hemiparesis 11/05/2014  . Cognitive deficit, post-stroke 11/05/2014  . Left-sided weakness   . Acute right anterior cerebral artery (ACA) ischemic stroke 10/31/2014    Collier Salina, OTR/L 03/25/2015, 4:12 PM  [facility] [address] [place], [place], [address] Phone: [phone]   Fax:  [phone]

## 2015-04-02 ENCOUNTER — Ambulatory Visit: Payer: Medicaid Other | Admitting: Occupational Therapy

## 2015-04-02 ENCOUNTER — Encounter: Payer: Self-pay | Admitting: Occupational Therapy

## 2015-04-02 DIAGNOSIS — I69359 Hemiplegia and hemiparesis following cerebral infarction affecting unspecified side: Secondary | ICD-10-CM

## 2015-04-02 DIAGNOSIS — I69998 Other sequelae following unspecified cerebrovascular disease: Secondary | ICD-10-CM | POA: Diagnosis not present

## 2015-04-02 NOTE — Therapy (Signed)
[facility] [address] [place], [place], [address] Phone: [phone]   Fax:  [phone]  Occupational Therapy Treatment  Patient Details  Name: [name] Short MRN: [id number] Date of Birth: 1966/06/17 Referring Provider:  Boykin Nearing, MD  Encounter Date: 04/02/2015      OT End of Session - 04/02/15 1416    Visit Number 11   Number of Visits 12   Authorization Type Medicaid   Authorization - Visit Number 11   Authorization - Number of Visits 12   OT Start Time 1937   OT Stop Time 1353   OT Time Calculation (min) 38 min   Activity Tolerance Patient tolerated treatment well   Behavior During Therapy [facility] for tasks assessed/performed      Past Medical History  Diagnosis Date  . Hypertension     a. Previously on meds but none in several years.  . Smoker     a. Previous tobacco - quit ~ 2013. Ongoing daily marijuana usage. Occasional cigar.  . Stroke     a. 10/2014 MRI Head: acute mod size nonhemorrhagic R ant cerebral artery distribution infarct involving the medial aspect of the right frontal and parietal lobe and right aspect of the corpus callosum w/ local mass effect-->TPA;  b. 10/2014 MRA: Abrupt cut off of the R ACA A2 segment;  b. 10/2014 Carotid U/S: 1-39% bilat ICA stenosis.  . Cardiomyopathy     a. 10/2014 Echo: EF 35-40%, diff HK, sev LVH, mod dil LA, PASP 19mHg.;  b.  Echo 7/16: Severe LVH, EF 55-60%, aortic sclerosis without stenosis, mild AI, trivial MR, severe BAE    Past Surgical History  Procedure Laterality Date  . Tee without cardioversion N/A 11/03/2014    Procedure: TRANSESOPHAGEAL ECHOCARDIOGRAM (TEE);  Surgeon: DLarey Dresser MD;  [facility]  Service: Cardiovascular;  Laterality: N/A;    There were no vitals filed for this visit.  Visit Diagnosis:  Hemiparesis affecting nondominant side as late effect of stroke      Subjective Assessment - 04/02/15 1411    Subjective  I  made salmon and rice for the kids- they gobbled it up!   Patient is accompained by: Family member   Patient Stated Goals Improve use of left hand, return to independence with ADL/IADL   Currently in Pain? No/denies   Pain Score 0-No pain                      OT Treatments/Exercises (OP) - 04/02/15 0001    ADLs   Functional Mobility Worked with patient for functional ambulation today incorporating left hand into activity while walking.  Patient able to place set of keys into left front pocket and retrieve with left hand, turn key in lock and open light, and heavy doors.  With cueing, patient able to plan for managing the door while others stepping through behind him.  Patient ambulating outside, up and down slight grades without difficulty, with cueing to manage step length with left leg.  Patient able to get down onto and back up off of the floor without assistance today.  He wants to be able to recline on the floor with the kids to watch TV at home.     Cooking Patient and  girlfriend reporting that patient is independent with meal preparation - complex.     Driving Patient's girlfriend asked about driving.  Indicated that patient was not showing sufficient reaction time at this point for  driving.  Patient indicated, I don't think I am ready for that.  Girlfriend visibly relieved when patient indicated it was too soon to thinkl about driving.     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                  OT Education - 04/02/15 1416    Education provided Yes   Education Details Goals, home activity program, discussed driving   Person(s) Educated Patient;Spouse   Methods Explanation;Demonstration   Comprehension Verbalized understanding;Returned demonstration          OT Short Term Goals - 03/12/15 1726    OT SHORT TERM GOAL #1   Title Patient will don pants with  no more thanmin assist   Status Achieved   OT SHORT TERM GOAL #2   Title Patient will don socks and shoes with set up assistance and increased time   Status Achieved   OT SHORT TERM GOAL #3   Title Patient will stand at sink to complete hygiene task with no grater than initial min assist and then close supervision   Status Achieved   OT [place] #4   Title Patient will transfer to shower using tub transfer bench and close supervision   Status Achieved           OT Long Term Goals - 04/02/15 1420    [place] #1   Title Patient will dress himself with modified independence   Status Achieved   OT LONG TERM GOAL #2   Title Patient will toilet himself with supervision (includes transfers, hygiene and clothing management)   Status Achieved   OT LONG TERM GOAL #3   Title Patient will prepare simple meal for himself (cold) with supervision, incorporating standing in part   Status Achieved   OT LONG TERM GOAL #4   Title Patient will shower himself with supervision(include transfer and bathing)   Status Achieved   OT LONG TERM GOAL #5   Title Pt will demonstrate improved fine motor coordiantion as evidenced by decreasing LUE 9 hole peg test score to 35 secs or less.   Status Achieved   OT LONG TERM GOAL #6   Title Pt will demonstrate improved functional grasp/ release ase evidenced by increasing LUE box/ blocks to 40 blocks.   Status Achieved               Plan - 04/02/15 1417    Clinical Impression Statement Patient has met and exceeded all long term goals and is prepared for discharge from OT services.  Patient and girlfriend very pleased with his progress.   Pt will benefit from skilled therapeutic intervention in order to improve on the following deficits (Retired) Decreased balance;Decreased cognition;Decreased coordination;Decreased safety awareness;Impaired sensation;Impaired tone;Impaired UE functional use;Decreased strength;Impaired perceived functional  ability;Impaired vision/preception;Decreased knowledge of use of DME;Decreased range of motion   Rehab Potential Good   OT Frequency 1x / week   OT Duration 12 weeks   OT Treatment/Interventions Self-care/ADL training;Therapeutic exercise;Neuromuscular education;Functional Mobility Training;Passive range of motion;Therapeutic activities;Cognitive remediation/compensation;Visual/perceptual remediation/compensation;DME and/or AE instruction;Patient/family education;Balance training   Plan discharge - cancel future appointments   OT Home Exercise Plan Patient encouraged to incorporate left arm into all functional tasks as before the stroke.  Patient and girlfriend have done an excellent job with this.     Consulted and  Agree with Plan of Care Patient;Family member/caregiver   Family Member Consulted girlfriend- [name]        Problem List Patient Active Problem List   Diagnosis Date Noted  . Depression due to stroke 03/15/2015  . Gait disturbance, post-stroke 01/19/2015  . Atrial fibrillation 12/25/2014  . Hypertensive heart disease 12/15/2014  . Cardiomyopathy 12/08/2014  . Insomnia 12/07/2014  . Essential hypertension 12/07/2014  . Secondary cardiomyopathy 11/05/2014  . Malignant hypertension 11/05/2014  . Hyperlipidemia LDL goal <70 11/05/2014  . Cigarette smoker 11/05/2014  . Marijuana abuse 11/05/2014  . Hypokalemia 11/05/2014  . Left hemiparesis 11/05/2014  . Cognitive deficit, post-stroke 11/05/2014  . Left-sided weakness   . Acute right anterior cerebral artery (ACA) ischemic stroke 10/31/2014   OCCUPATIONAL THERAPY DISCHARGE SUMMARY  Visits from Start of Care: 11  Current functional level related to goals / functional outcomes: Supervision to modified independence with ADL/IADL   Remaining deficits: Decreased sensation and motor control in left UE   Education / Equipment: Tub transfer bench, walker, SBQC, AFO Plan: Patient agrees to discharge.  Patient goals  were met. Patient is being discharged due to meeting the stated rehab goals.  ?????         Mariah Milling, OTR/L 04/02/2015, 2:22 PM  [facility] [address] [place], [place], [address] Phone: [phone]   Fax:  [phone]

## 2015-04-06 ENCOUNTER — Ambulatory Visit: Payer: Medicaid Other | Attending: Family Medicine | Admitting: Pharmacist

## 2015-04-06 VITALS — BP 138/88 | HR 60

## 2015-04-06 DIAGNOSIS — I10 Essential (primary) hypertension: Secondary | ICD-10-CM | POA: Diagnosis not present

## 2015-04-06 DIAGNOSIS — Z87891 Personal history of nicotine dependence: Secondary | ICD-10-CM | POA: Diagnosis not present

## 2015-04-06 NOTE — Patient Instructions (Addendum)
Thank you for coming to see me today!  Start checking your blood pressure at home every day  If you see readings >140/90, that is too high. Keep a record and come back and see me in two weeks.  Keep taking all of your medications as prescribed.

## 2015-04-06 NOTE — Progress Notes (Signed)
S:    Patient arrives with the use of a cane, with his wife.  He presents to the clinic for hypertension evaluation.    Patient reports adherence with medications. His wife helps with medication management.  Current BP Medications include:  Amlodipine 10 mg daily, carvedilol 25 mg BID, and lisinopril 40 mg daily.   Patient reports that coming to doctors appointments does make him stressed.  His wife reports that she has a blood pressure cuff at home. They have used it in the past but they have not used it any time recently.   O:   Last 3 Office BP readings: BP Readings from Last 3 Encounters:  04/06/15 138/88  03/23/15 152/100  03/15/15 168/100    BMET    Component Value Date/Time   NA 140 03/15/2015 1043   K 4.2 03/15/2015 1043   CL 107 03/15/2015 1043   CO2 26 03/15/2015 1043   GLUCOSE 74 03/15/2015 1043   BUN 12 03/15/2015 1043   CREATININE 1.11 03/15/2015 1043   CREATININE 1.10 01/22/2015 1135   CALCIUM 9.1 03/15/2015 1043   GFRNONAA 79 03/15/2015 1043   GFRNONAA >60 12/01/2014 0600   GFRAA >89 03/15/2015 1043   GFRAA >60 12/01/2014 0600    A/P:  Hypertension: currently is controlled on current medications. His first reading was 170/100 but once patient relaxed, it came down to goal at 138/88. I think that there may be an element of white coat hypertension here. I instructed his wife to use the home blood pressure monitoring to record daily blood pressures and bring them to next visit. Counseled on the importance of having a blood pressure a goal to prevent future stroke and other complications. No changes to medications at this time but will reassess at next visit. Patient was on hydrochlorothiazide in the past but had some electrolyte imbalances (low potassium) so can consider adding that back now that he is on an ACEi that should help keep his potassium at goal.    Results reviewed and written information provided.   F/U Clinic Visit with me in two weeks.  Total time  in face-to-face counseling 20 minutes.

## 2015-04-09 ENCOUNTER — Encounter: Payer: Medicaid Other | Admitting: Occupational Therapy

## 2015-04-13 ENCOUNTER — Encounter: Payer: Self-pay | Admitting: Pharmacist

## 2015-04-13 NOTE — Progress Notes (Signed)
Mrs. Minus presented to clinic today to update me on her husbands blood pressure readings at home.  She reported that the blood pressure readings are >140/90, typically with systolics in the 140s and diastolics 90s-100s.   I instructed her to make a follow up visit with me and I would evaluate him and adjust medications as needed. I expect that a new medication will have to be added as the only medication that can be titrated is carvedilol and his pulse has been on the low end of normal.     Juanita Craver, PharmD, BCPS, CPP Clinical Pharmacist Practitioner  [facility] [phone]

## 2015-04-15 ENCOUNTER — Ambulatory Visit (INDEPENDENT_AMBULATORY_CARE_PROVIDER_SITE_OTHER): Payer: Medicaid Other | Admitting: *Deleted

## 2015-04-15 DIAGNOSIS — I480 Paroxysmal atrial fibrillation: Secondary | ICD-10-CM | POA: Diagnosis not present

## 2015-04-15 NOTE — Progress Notes (Signed)
Pt was started on Eliquis  q 12 hours  for Atrial Fib on May 25,2016 .    Reviewed patients medication list.  Pt is not currently on any combined P-gp and strong CYP3A4 inhibitors/inducers (ketoconazole, traconazole, ritonavir, carbamazepine, phenytoin, rifampin, St. John's wort).  Reviewed labs.  SCr 1.1 (03/15/2015) , Weight 83.36 kg  .  Dose is  appropriate based on weight age and SrCr.   Hgb 12.8 (03/15/2015)  and HCT 40.3 (03/15/2015)  A full discussion of the nature of anticoagulants has been carried out.  A benefit/risk analysis has been presented to the patient, so that they understand the justification for choosing anticoagulation with Eliquis at this time.  The need for compliance is stressed.  Pt is aware to take the medication twice daily.  Side effects of potential bleeding are discussed, including unusual colored urine or stools, coughing up blood or coffee ground emesis, nose bleeds or serious fall or head trauma.  Discussed signs and symptoms of stroke. The patient should avoid any OTC items containing aspirin or ibuprofen.  Avoid alcohol consumption.   Call if any signs of abnormal bleeding.  Discussed financial obligations and pt states is not having any difficulty in obtaining medication.  Next lab test test in 6 months.  Pt states has not missed any doses and has had not sign or symptom of bleeding or any sign or symptom of stroke  Has had change in medication lisinopril 40 mg daily and Coreg  bid   Pt instructed to continue Eliquis  every 12 hours and appt made for recheck in 6 months

## 2015-04-16 ENCOUNTER — Encounter: Payer: Medicaid Other | Admitting: Occupational Therapy

## 2015-04-21 ENCOUNTER — Encounter: Payer: Self-pay | Admitting: Pharmacist

## 2015-04-21 ENCOUNTER — Ambulatory Visit: Payer: Medicaid Other | Attending: Family Medicine | Admitting: Pharmacist

## 2015-04-21 VITALS — BP 132/88

## 2015-04-21 DIAGNOSIS — Z79899 Other long term (current) drug therapy: Secondary | ICD-10-CM | POA: Diagnosis not present

## 2015-04-21 DIAGNOSIS — I10 Essential (primary) hypertension: Secondary | ICD-10-CM | POA: Diagnosis not present

## 2015-04-21 MED ORDER — HYDROCHLOROTHIAZIDE 12.5 MG PO TABS: 12.5000 mg | ORAL_TABLET | Freq: Every day | ORAL | Status: DC

## 2015-04-21 NOTE — Progress Notes (Signed)
S:    Patient arrives in a pleasant mood with his wife. Presents to the clinic for hypertension evaluation.   Patient reports adherence with medications.  Current BP Medications include:  Amlodipine  daily, lisinopril  daily, and carvedilol  BID  Antihypertensives tried in the past include: HCTZ  Patient's wife reports that BP readings at home have been in the range of 140/90-140/100  O:   Last 3 Office BP readings: BP Readings from Last 3 Encounters:  04/21/15 132/88  04/06/15 138/88  03/23/15 152/100    BMET    Component Value Date/Time   NA 140 03/15/2015 1043   K 4.2 03/15/2015 1043   CL 107 03/15/2015 1043   CO2 26 03/15/2015 1043   GLUCOSE 74 03/15/2015 1043   BUN 12 03/15/2015 1043   CREATININE 1.11 03/15/2015 1043   CREATININE 1.10 01/22/2015 1135   CALCIUM 9.1 03/15/2015 1043   GFRNONAA 79 03/15/2015 1043   GFRNONAA >60 12/01/2014 0600   GFRAA >89 03/15/2015 1043   GFRAA >60 12/01/2014 0600    A/P: History of hypertension with borderline control on current medications. Initial reading of 149/101 with a repeat manual reading of 132/88. Unable to titrate current medications further and likely needs an additional agent. Reviewed most recent CMP from August and results are within normal limits. Discussed initiation of HCTZ with Dr. Armen Pickup and she agrees to start 12.5mg  once daily. Counseled patient on potential adverse effects (is currently on K+ supplementation) as well as the importance of staying well hydrated. Will continue to monitor BP at home and follow up in two weeks to assess.  Results reviewed and written information provided.   Total time in face-to-face counseling 15 minutes.   F/U Pharmacy Clinic Visit in 2 weeks.  Patient seen with Sherle Poe, PharmD resident.

## 2015-04-21 NOTE — Patient Instructions (Signed)
Good to see you today!  Start taking hydrochlorothiazide 12.5mg  once daily.  Keep checking your blood pressure at home as you have been.  Follow up in the pharmacy clinic in 2 weeks.

## 2015-04-23 ENCOUNTER — Encounter: Payer: Self-pay | Admitting: Physical Medicine & Rehabilitation

## 2015-04-23 ENCOUNTER — Ambulatory Visit (HOSPITAL_BASED_OUTPATIENT_CLINIC_OR_DEPARTMENT_OTHER): Payer: Medicaid Other | Admitting: Physical Medicine & Rehabilitation

## 2015-04-23 ENCOUNTER — Encounter: Payer: Medicaid Other | Attending: Physical Medicine & Rehabilitation

## 2015-04-23 VITALS — BP 133/93 | HR 74

## 2015-04-23 DIAGNOSIS — I63521 Cerebral infarction due to unspecified occlusion or stenosis of right anterior cerebral artery: Secondary | ICD-10-CM | POA: Diagnosis not present

## 2015-04-23 DIAGNOSIS — Z5181 Encounter for therapeutic drug level monitoring: Secondary | ICD-10-CM

## 2015-04-23 DIAGNOSIS — R269 Unspecified abnormalities of gait and mobility: Secondary | ICD-10-CM

## 2015-04-23 DIAGNOSIS — Z79899 Other long term (current) drug therapy: Secondary | ICD-10-CM | POA: Diagnosis not present

## 2015-04-23 DIAGNOSIS — G8194 Hemiplegia, unspecified affecting left nondominant side: Secondary | ICD-10-CM

## 2015-04-23 DIAGNOSIS — G8190 Hemiplegia, unspecified affecting unspecified side: Secondary | ICD-10-CM

## 2015-04-23 DIAGNOSIS — I6931 Cognitive deficits following cerebral infarction: Secondary | ICD-10-CM | POA: Insufficient documentation

## 2015-04-23 DIAGNOSIS — I69398 Other sequelae of cerebral infarction: Secondary | ICD-10-CM

## 2015-04-23 MED ORDER — METHYLPHENIDATE HCL 10 MG PO TABS: 5.0000 mg | ORAL_TABLET | Freq: Two times a day (BID) | ORAL | Status: DC

## 2015-04-23 NOTE — Progress Notes (Signed)
Subjective:    Patient ID: [name] Short, male    DOB: 1965-08-18, 48 y.o.   MRN: [id number]  HPI 49 year old maleWith history of right anterior cerebral artery infarct onset 10/31/2014 associated with malignant hypertension. He completed inpatient rehabilitation  As well as home health and most recently outpatient rehabilitation. He continues to have weakness in his left lower extremity but is now ambulating with a quad cane. Pain Inventory Average Pain 0 Pain Right Now 0 My pain is no pain  In the last 24 hours, has pain interfered with the following? General activity 0 Relation with others 0 Enjoyment of life 0 What TIME of day is your pain at its worst? no pain Sleep (in general) NA  Pain is worse with: no pain Pain improves with: no pain Relief from Meds: 0  Mobility walk with assistance use a cane  Function disabled: date disabled .  Neuro/Psych trouble walking  Prior Studies Any changes since last visit?  no  Physicians involved in your care Any changes since last visit?  no   Family History  Problem Relation Age of Onset  . Other      no premature cad  . Diabetes Mother   . Heart disease Father   . Heart attack Father   . Stroke Sister   . Hypertension Father   . Hypertension Brother   . Hypertension Sister    Social History   Social History  . Marital Status: Single    Spouse Name: N/A  . Number of Children: 3  . Years of Education: 12   Occupational History  . Unemployed    Social History Main Topics  . Smoking status: Former Smoker    Types: Cigars    Quit date: 10/31/2014  . Smokeless tobacco: None     Comment: previously smoked cigarettes - quit ~ 2013.  Marland Kitchen Alcohol Use: 0.0 oz/week    0 Standard drinks or equivalent per week     Comment: Alcohol use stopped in April 2016.  . Drug Use: Yes     Comment: Marijuana daily. Has not had marijuana since being in hospital.  . Sexual Activity: Not Asked   Other Topics Concern  . None    Social History Narrative   Lives in [place] with his GF.  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   Right-handed.   Occasional caffeine use.   Past Surgical History  Procedure Laterality Date  . Tee without cardioversion N/A 11/03/2014    Procedure: TRANSESOPHAGEAL ECHOCARDIOGRAM (TEE);  Surgeon: Laurey Morale, MD;  Location: [facility] ENDOSCOPY;  Service: Cardiovascular;  Laterality: N/A;   Past Medical History  Diagnosis Date  . Hypertension     a. Previously on meds but none in several years.  . Smoker     a. Previous tobacco - quit ~ 2013. Ongoing daily marijuana usage. Occasional cigar.  . Stroke     a. 10/2014 MRI Head: acute mod size nonhemorrhagic R ant cerebral artery distribution infarct involving the medial aspect of the right frontal and parietal lobe and right aspect of the corpus callosum w/ local mass effect-->TPA;  b. 10/2014 MRA: Abrupt cut off of the R ACA A2 segment;  b. 10/2014 Carotid U/S: 1-39% bilat ICA stenosis.  . Cardiomyopathy     a. 10/2014 Echo: EF 35-40%, diff HK, sev LVH, mod dil LA, PASP .;  b.  Echo 7/16: Severe LVH, EF 55-60%, aortic sclerosis without stenosis, mild AI, trivial MR, severe BAE  BP 133/93 mmHg  Pulse 74  SpO2 99%  Opioid Risk Score:   Fall Risk Score:  `1  Depression screen PHQ 2/9  Depression screen [facility] 2/9 04/23/2015 03/23/2015 03/15/2015 02/18/2015 12/25/2014 12/24/2014 12/07/2014  Decreased Interest 0 0 0 0 0 0 3  Down, Depressed, Hopeless 0 0 0 0 0 0 0  PHQ - 2 Score 0 0 0 0 0 0 3  Altered sleeping - - - - 0 - 3  Tired, decreased energy - - - - 0 - 3  Change in appetite - - - - 0 - 0  Feeling bad or failure about yourself  - - - - 0 - 3  Trouble concentrating - - - - 0 - 3  Moving slowly or fidgety/restless - - - - 0 - 0  Suicidal thoughts - - - - 0 - 0  PHQ-9 Score - - - - 0 - 15     Review of Systems  Musculoskeletal: Positive for gait problem.  All other systems reviewed and are negative.      Objective:   Physical  Exam  Motor strength is 4/5 in the left deltoid, biceps, triceps, grip 3 minus at the hip flexor knee extensor 2 minus at the ankle dorsal flexor plantar flexor Wears a left AFO Right-sided strength is 5/5 in the same muscle groups Mood and affect are flat. He is able to an attend to question and attendant toward left side  Ambulates with a quad cane and left AFO, no evidence of toe drag or knee instability, he can also walk short distances without a quad cane    Assessment & Plan:  1. Right ACA infarct with left hemiparesis-he is ~6 month post CVA. I do think he has permanent deficits. Do not recommend further therapy at this time he is reached at least a modified independent level He has been on Ritalin a number of months. His attention concentration have improved he remains with a flat affect. Will reduce his Ritalin to 5 mg twice a day and recheck in one month. If there is no change in the level activation would consider discontinuation. I think the patient is permanently and totally disabled from work At this point I do not recommend return to driving

## 2015-05-04 ENCOUNTER — Other Ambulatory Visit: Payer: Self-pay | Admitting: General Practice

## 2015-05-04 DIAGNOSIS — G4700 Insomnia, unspecified: Secondary | ICD-10-CM

## 2015-05-04 NOTE — Telephone Encounter (Signed)
Patient requesting medication refill for: traZODone (DESYREL) 50 MG tablet Please assist

## 2015-05-06 NOTE — Telephone Encounter (Signed)
Pt. Called requesting a med refill on traZODone (DESYREL) 50 MG tablet. Please f/u with pt. °

## 2015-05-07 NOTE — Telephone Encounter (Signed)
Pt. Called requesting a med refill on traZODone (DESYREL) 50 MG tablet. Please f/u with pt. °

## 2015-05-10 NOTE — Telephone Encounter (Signed)
Pt. Called requesting a med refill on traZODone (DESYREL) 50 MG tablet. Please f/u with pt. °

## 2015-05-11 NOTE — Telephone Encounter (Signed)
Pt. Called to request a med refill on traZODone (DESYREL) 50 MG tablet. Please f/u.

## 2015-05-12 MED ORDER — TRAZODONE HCL 50 MG PO TABS: 50.0000 mg | ORAL_TABLET | Freq: Every evening | ORAL | Status: DC | PRN

## 2015-05-12 NOTE — Telephone Encounter (Signed)
Trazodone refilled.

## 2015-05-20 ENCOUNTER — Ambulatory Visit: Payer: Medicaid Other | Admitting: Physical Medicine & Rehabilitation

## 2015-05-21 ENCOUNTER — Encounter: Payer: Self-pay | Admitting: Physical Medicine & Rehabilitation

## 2015-05-21 ENCOUNTER — Encounter: Payer: Medicaid Other | Attending: Physical Medicine & Rehabilitation

## 2015-05-21 ENCOUNTER — Ambulatory Visit (HOSPITAL_BASED_OUTPATIENT_CLINIC_OR_DEPARTMENT_OTHER): Payer: Medicaid Other | Admitting: Physical Medicine & Rehabilitation

## 2015-05-21 VITALS — BP 136/85 | HR 73 | Resp 15

## 2015-05-21 DIAGNOSIS — I69398 Other sequelae of cerebral infarction: Secondary | ICD-10-CM

## 2015-05-21 DIAGNOSIS — I69319 Unspecified symptoms and signs involving cognitive functions following cerebral infarction: Secondary | ICD-10-CM | POA: Diagnosis not present

## 2015-05-21 DIAGNOSIS — IMO0002 Reserved for concepts with insufficient information to code with codable children: Secondary | ICD-10-CM

## 2015-05-21 DIAGNOSIS — I6931 Attention and concentration deficit following cerebral infarction: Secondary | ICD-10-CM | POA: Insufficient documentation

## 2015-05-21 DIAGNOSIS — G8194 Hemiplegia, unspecified affecting left nondominant side: Secondary | ICD-10-CM | POA: Diagnosis not present

## 2015-05-21 DIAGNOSIS — G8190 Hemiplegia, unspecified affecting unspecified side: Secondary | ICD-10-CM | POA: Diagnosis present

## 2015-05-21 DIAGNOSIS — I63521 Cerebral infarction due to unspecified occlusion or stenosis of right anterior cerebral artery: Secondary | ICD-10-CM | POA: Diagnosis not present

## 2015-05-21 DIAGNOSIS — I639 Cerebral infarction, unspecified: Secondary | ICD-10-CM

## 2015-05-21 DIAGNOSIS — F0631 Mood disorder due to known physiological condition with depressive features: Secondary | ICD-10-CM

## 2015-05-21 DIAGNOSIS — R269 Unspecified abnormalities of gait and mobility: Secondary | ICD-10-CM

## 2015-05-21 NOTE — Patient Instructions (Signed)
Please use seated exercise equipment  Also I would recommend stationary bicycle rather than treadmill for aerobic training.

## 2015-05-21 NOTE — Progress Notes (Signed)
Subjective:    Patient ID: [name] Short, male    DOB: 04-10-1966, 49 y.o.   MRN: [id number]  HPI 49 y.o.right handed  male with history of hypertension as well as remote tobacco abuse. Patient on no scheduled medications upon admission. Patient independent prior to admission living with his girlfriend working at [employer]. Presented 10/31/2014 with left-sided weakness and altered mental status. Noted blood pressure 220/110.   Patient is no longer taking Ritalin. No change in level of alertness. On the plus side he is now able to fall asleep sooner at night. Remains independent with all his self-care and mobility.  Pain Inventory Average Pain 0 Pain Right Now 0 My pain is NA  In the last 24 hours, has pain interfered with the following? General activity 5 Relation with others 10 Enjoyment of life 5 What TIME of day is your pain at its worst? NA Sleep (in general) Good  Pain is worse with: NA Pain improves with: NA Relief from Meds: 0  Mobility walk with assistance use a cane use a walker how many minutes can you walk? 10 ability to climb steps?  no do you drive?  no transfers alone  Function disabled: date disabled NA  Neuro/Psych No problems in this area  Prior Studies Any changes since last visit?  no  Physicians involved in your care Any changes since last visit?  no   Family History  Problem Relation Age of Onset  . Other      no premature cad  . Diabetes Mother   . Heart disease Father   . Heart attack Father   . Stroke Sister   . Hypertension Father   . Hypertension Brother   . Hypertension Sister    Social History   Social History  . Marital Status: Single    Spouse Name: N/A  . Number of Children: 3  . Years of Education: 12   Occupational History  . Unemployed    Social History Main Topics  . Smoking status: Former Smoker    Types: Cigars    Quit date: 10/31/2014  . Smokeless tobacco: None     Comment: previously smoked  cigarettes - quit ~ 2013.  Marland Kitchen Alcohol Use: 0.0 oz/week    0 Standard drinks or equivalent per week     Comment: Alcohol use stopped in April 2016.  . Drug Use: Yes     Comment: Marijuana daily. Has not had marijuana since being in hospital.  . Sexual Activity: Not Asked   Other Topics Concern  . None   Social History Narrative   Lives in [place] with his GF.  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   Right-handed.   Occasional caffeine use.   Past Surgical History  Procedure Laterality Date  . Tee without cardioversion N/A 11/03/2014    Procedure: TRANSESOPHAGEAL ECHOCARDIOGRAM (TEE);  Surgeon: Laurey Morale, MD;  Location: [facility] ENDOSCOPY;  Service: Cardiovascular;  Laterality: N/A;   Past Medical History  Diagnosis Date  . Hypertension     a. Previously on meds but none in several years.  . Smoker     a. Previous tobacco - quit ~ 2013. Ongoing daily marijuana usage. Occasional cigar.  . Stroke [facility])     a. 10/2014 MRI Head: acute mod size nonhemorrhagic R ant cerebral artery distribution infarct involving the medial aspect of the right frontal and parietal lobe and right aspect of the corpus callosum w/ local mass effect-->TPA;  b. 10/2014 MRA:  Abrupt cut off of the R ACA A2 segment;  b. 10/2014 Carotid U/S: 1-39% bilat ICA stenosis.  . Cardiomyopathy (HCC)     a. 10/2014 Echo: EF 35-40%, diff HK, sev LVH, mod dil LA, PASP 45mmHg.;  b.  Echo 7/16: Severe LVH, EF 55-60%, aortic sclerosis without stenosis, mild AI, trivial MR, severe BAE   BP 136/85 mmHg  Pulse 73  Resp 15  SpO2 99%  Opioid Risk Score:   Fall Risk Score:  `1  Depression screen PHQ 2/9  [facility] 04/23/2015 03/23/2015 03/15/2015 02/18/2015 12/25/2014 12/24/2014 12/07/2014  Decreased Interest 0 0 0 0 0 0 3  Down, Depressed, Hopeless 0 0 0 0 0 0 0  PHQ - 2 Score 0 0 0 0 0 0 3  Altered sleeping - - - - 0 - 3  Tired, decreased energy - - - - 0 - 3  Change in appetite - - - - 0 - 0  Feeling bad or failure  about yourself  - - - - 0 - 3  Trouble concentrating - - - - 0 - 3  Moving slowly or fidgety/restless - - - - 0 - 0  Suicidal thoughts - - - - 0 - 0  PHQ-9 Score - - - - 0 - 15      Review of Systems  All other systems reviewed and are negative.      Objective:   Physical Exam  Constitutional: He is oriented to person, place, and time. He appears well-developed and well-nourished.  HENT:  Head: Normocephalic and atraumatic.  Eyes: Conjunctivae and EOM are normal. Pupils are equal, round, and reactive to light.  Neurological: He is alert and oriented to person, place, and time.  Psychiatric: He has a normal mood and affect. His speech is delayed.  Nursing note and vitals reviewed.  Patient has increased latency response. He has some evidence of apraxia as well. When asked to do confrontation testing he also put out his arms to the side and kept and there even though we moved on to another task. His motor strength is 4/5 in left upper extremity in the deltoid, biceps, triceps, grip Left lower extremity is 3 minus hip flexor and knee extensor 2 minus ankle dorsiflexor Wears a left AFO 5/5 in the Right upper and right lower extremity      Assessment & Plan:  1. Right ACA infarct with left hemiparesis-he is ~6 month post CVA. I do think he has permanent deficits. Do not recommend further therapy at this time he is reached at least a modified independent level Patient has likely plateaued from physical functional standpoint however he may make some additional improvements from his cognitive deficits Including attention, concentration, persistence Because of his cognitive deficits I do not recommend driving.Also do not recommend return to work Return to clinic in 6 months

## 2015-06-21 ENCOUNTER — Other Ambulatory Visit: Payer: Self-pay | Admitting: Family Medicine

## 2015-06-24 ENCOUNTER — Emergency Department (HOSPITAL_COMMUNITY)
Admission: EM | Admit: 2015-06-24 | Discharge: 2015-06-25 | Disposition: A | Discharge status: Home / Self Care | Payer: Medicaid Other | Attending: Emergency Medicine | Admitting: Emergency Medicine

## 2015-06-24 ENCOUNTER — Emergency Department (HOSPITAL_COMMUNITY): Payer: Medicaid Other

## 2015-06-24 DIAGNOSIS — Z8673 Personal history of transient ischemic attack (TIA), and cerebral infarction without residual deficits: Secondary | ICD-10-CM | POA: Diagnosis not present

## 2015-06-24 DIAGNOSIS — Z791 Long term (current) use of non-steroidal anti-inflammatories (NSAID): Secondary | ICD-10-CM | POA: Insufficient documentation

## 2015-06-24 DIAGNOSIS — Z87891 Personal history of nicotine dependence: Secondary | ICD-10-CM | POA: Diagnosis not present

## 2015-06-24 DIAGNOSIS — R569 Unspecified convulsions: Secondary | ICD-10-CM | POA: Diagnosis present

## 2015-06-24 DIAGNOSIS — R531 Weakness: Secondary | ICD-10-CM | POA: Diagnosis not present

## 2015-06-24 DIAGNOSIS — R479 Unspecified speech disturbances: Secondary | ICD-10-CM | POA: Insufficient documentation

## 2015-06-24 DIAGNOSIS — Z79899 Other long term (current) drug therapy: Secondary | ICD-10-CM | POA: Insufficient documentation

## 2015-06-24 DIAGNOSIS — I729 Aneurysm of unspecified site: Secondary | ICD-10-CM | POA: Insufficient documentation

## 2015-06-24 DIAGNOSIS — I10 Essential (primary) hypertension: Secondary | ICD-10-CM | POA: Diagnosis not present

## 2015-06-24 DIAGNOSIS — Z7902 Long term (current) use of antithrombotics/antiplatelets: Secondary | ICD-10-CM | POA: Diagnosis not present

## 2015-06-24 LAB — BASIC METABOLIC PANEL
ANION GAP: 6 (ref 5–15)
BUN: 19 mg/dL (ref 6–20)
CHLORIDE: 107 mmol/L (ref 101–111)
CO2: 25 mmol/L (ref 22–32)
Calcium: 9.1 mg/dL (ref 8.9–10.3)
Creatinine, Ser: 1.19 mg/dL (ref 0.61–1.24)
GFR calc Af Amer: >60 mL/min (ref >60)
GLUCOSE: 92 mg/dL (ref 65–99)
POTASSIUM: 4.1 mmol/L (ref 3.5–5.1)
Sodium: 138 mmol/L (ref 135–145)

## 2015-06-24 LAB — CBC
HEMATOCRIT: 41.2 % (ref 39.0–52.0)
HEMOGLOBIN: 13.5 g/dL (ref 13.0–17.0)
MCH: 30.6 pg (ref 26.0–34.0)
MCHC: 32.8 g/dL (ref 30.0–36.0)
MCV: 93.4 fL (ref 78.0–100.0)
Platelets: 117 10*3/uL — ABNORMAL LOW (ref 150–400)
RBC: 4.41 MIL/uL (ref 4.22–5.81)
RDW: 13.4 % (ref 11.5–15.5)
WBC: 4.5 10*3/uL (ref 4.0–10.5)

## 2015-06-24 LAB — CBG MONITORING, ED: Glucose-Capillary: 95 mg/dL (ref 65–99)

## 2015-06-24 MED ORDER — IOHEXOL 350 MG/ML SOLN: 75.0000 mL | Freq: Once | INTRAVENOUS | Status: DC | PRN

## 2015-06-24 MED ORDER — LEVETIRACETAM 500 MG/5ML IV SOLN
1000.0000 mg | Freq: Once | INTRAVENOUS | Status: AC
  Administered 2015-06-24: 1000 mg via INTRAVENOUS
  Filled 2015-06-24: qty 10

## 2015-06-24 NOTE — ED Notes (Signed)
Patient transported to CT 

## 2015-06-24 NOTE — ED Notes (Signed)
[facility], per report pt had witnessed seizure by spouse lasting 2-3 mins with posturing & shaking, pt has tongue injury to L lateral with no bleeding, no incontinence reported, wife reports the pt was confused with slurred speech post seizure, EMS reports no slurred speech & the pt was A&O x4 when they arrived, pt A&O x4 upon arrival to ED

## 2015-06-24 NOTE — ED Notes (Signed)
Pt reported by EMS to have L sided deficits from CVA that occurred April 2016, pt in A fib upon arrival to ED with hx of the same

## 2015-06-24 NOTE — ED Provider Notes (Signed)
CSN: [id number]     Arrival date & time 06/24/15  1923 History   First MD Initiated Contact with Patient 06/24/15 2023     Chief Complaint  Patient presents with  . Seizures     (Consider location/radiation/quality/duration/timing/severity/associated sxs/prior Treatment) HPI  Patient is a 49 year old male with past medical history significant for hypertension, history of CVA (residual left-sided weakness), who presents to the emergency department following a seizure. Patient reports approximately 1 hour prior to arrival his left lower extremity started shaking, he called out to his wife. When his wife arrived the patient was having generalized rhythmic shaking of his upper and lower extremities. States he was unresponsive during this time. Patient had times biting, no urinary or bowel incontinence. Seizure activity lasted approximately 2-3 minutes. Resolved without intervention. Following this episode he had some slurring of speech with confusion that lasted for approximately 5 minutes and then resolved. Patient returned to baseline upon arrival to the emergency department. Denies prior history of seizures. Denies recent falls or head injuries. Denies h/o cancer, no night sweats, no unexplained weight loss.  Past Medical History  Diagnosis Date  . Hypertension     a. Previously on meds but none in several years.  . Smoker     a. Previous tobacco - quit ~ 2013. Ongoing daily marijuana usage. Occasional cigar.  . Stroke [facility])     a. 10/2014 MRI Head: acute mod size nonhemorrhagic R ant cerebral artery distribution infarct involving the medial aspect of the right frontal and parietal lobe and right aspect of the corpus callosum w/ local mass effect-->TPA;  b. 10/2014 MRA: Abrupt cut off of the R ACA A2 segment;  b. 10/2014 Carotid U/S: 1-39% bilat ICA stenosis.  . Cardiomyopathy (HCC)     a. 10/2014 Echo: EF 35-40%, diff HK, sev LVH, mod dil LA, PASP .;  b.  Echo 7/16: Severe LVH, EF 55-60%,  aortic sclerosis without stenosis, mild AI, trivial MR, severe BAE   Past Surgical History  Procedure Laterality Date  . Tee without cardioversion N/A 11/03/2014    Procedure: TRANSESOPHAGEAL ECHOCARDIOGRAM (TEE);  Surgeon: Laurey Morale, MD;  Location: [facility] ENDOSCOPY;  Service: Cardiovascular;  Laterality: N/A;   Family History  Problem Relation Age of Onset  . Other      no premature cad  . Diabetes Mother   . Heart disease Father   . Heart attack Father   . Stroke Sister   . Hypertension Father   . Hypertension Brother   . Hypertension Sister    Social History  Substance Use Topics  . Smoking status: Former Smoker    Types: Cigars    Quit date: 10/31/2014  . Smokeless tobacco: Not on file     Comment: previously smoked cigarettes - quit ~ 2013.  Marland Kitchen Alcohol Use: 0.0 oz/week    0 [place] drinks or equivalent per week     Comment: Alcohol use stopped in April 2016.    Review of Systems  Constitutional: Negative for fever and appetite change.  HENT: Negative for congestion.   Eyes: Negative for visual disturbance.  Respiratory: Negative for chest tightness and shortness of breath.   Cardiovascular: Negative for chest pain and palpitations.  Gastrointestinal: Negative for nausea, vomiting, abdominal pain and blood in stool.  Genitourinary: Negative for dysuria and decreased urine volume.  Musculoskeletal: Negative for back pain.  Skin: Negative for rash.  Neurological: Positive for seizures, speech difficulty and weakness. Negative for dizziness, facial asymmetry, light-headedness, numbness and  headaches.  Psychiatric/Behavioral: Negative for behavioral problems.      Allergies  Review of patient's allergies indicates no known allergies.  Home Medications   Prior to Admission medications   Medication Sig Start Date End Date Taking? Authorizing Provider  amLODipine (NORVASC) 10 MG tablet TAKE 1 TABLET BY MOUTH DAILY 06/21/15  Yes Josalyn Funches, MD  apixaban  (ELIQUIS) 5 MG TABS tablet Take 1 tablet (5 mg total) by mouth 2 (two) times daily. 03/15/15  Yes Jake Bathe, MD  atorvastatin (LIPITOR) 40 MG tablet TAKE 1 TABLET BY MOUTH DAILY AT 6 PM. 06/21/15  Yes Josalyn Funches, MD  carvedilol (COREG) 25 MG tablet Take 1 tablet (25 mg total) by mouth 2 (two) times daily with a meal. 03/15/15  Yes Jake Bathe, MD  diclofenac sodium (VOLTAREN) 1 % GEL Apply 2 g topically 4 (four) times daily. 12/03/14  Yes Daniel J Angiulli, PA-C  hydrochlorothiazide (HYDRODIURIL) 12.5 MG tablet Take 1 tablet (12.5 mg total) by mouth daily. 04/21/15  Yes Quentin Angst, MD  lisinopril (PRINIVIL,ZESTRIL) 40 MG tablet Take 1 tablet (40 mg total) by mouth daily. 03/15/15  Yes Josalyn Funches, MD  potassium chloride SA (K-DUR,KLOR-CON) 20 MEQ tablet Take 2 tablets (40 mEq total) by mouth daily. On first day, take 4 tablets (total dose = 80 mEq), thereafter take 2 tablets (40 mEq dose) daily. 01/14/15  Yes Scott T Alben Spittle, PA-C  traZODone (DESYREL) 50 MG tablet Take 1 tablet (50 mg total) by mouth at bedtime as needed for sleep. 05/12/15  Yes Josalyn Funches, MD  citalopram (CELEXA) 20 MG tablet Take 1 tablet (20 mg total) by mouth daily. Patient not taking: Reported on 06/24/2015 02/04/15   Levert Feinstein, MD  levETIRAcetam (KEPPRA) 500 MG tablet Take 1 tablet (500 mg total) by mouth 2 (two) times daily. 06/25/15 07/26/15  Alvira Monday, MD  methylphenidate (RITALIN) 10 MG tablet Take 0.5 tablets (5 mg total) by mouth 2 (two) times daily with breakfast and lunch. Patient not taking: Reported on 06/24/2015 04/23/15   Erick Colace, MD   BP 130/92 mmHg  Pulse 66  Temp(Src) 98.5 F (36.9 C) (Oral)  Resp 18  SpO2 100% Physical Exam  Constitutional: He is oriented to person, place, and time. He appears well-developed and well-nourished.  HENT:  Head: Normocephalic and atraumatic.  Mouth/Throat: Oropharynx is clear and moist.  No tongue laceration   Eyes: Conjunctivae and EOM are  normal.  Neck: Normal range of motion. No JVD present. No tracheal deviation present. No Brudzinski's sign and no Kernig's sign noted.  Cardiovascular: Regular rhythm, normal heart sounds and intact distal pulses.   Pulmonary/Chest: Effort normal and breath sounds normal. No respiratory distress.  Abdominal: Soft. There is no tenderness.  Musculoskeletal: Normal range of motion.  Neurological: He is alert and oriented to person, place, and time. He has normal reflexes. He displays no tremor. No cranial nerve deficit or sensory deficit. He exhibits normal muscle tone. He displays a negative Romberg sign. He displays no seizure activity. Coordination and gait normal. GCS eye subscore is 4. GCS verbal subscore is 5. GCS motor subscore is 6. He displays no Babinski's sign on the right side. He displays no Babinski's sign on the left side.  Left-sided strength slightly decreased from the right side (at baseline per patient)  Skin: Skin is warm.  Psychiatric: He has a normal mood and affect.  Nursing note and vitals reviewed.   ED Course  Procedures (including critical care  time) Labs Review Labs Reviewed  CBC - Abnormal; Notable for the following:    Platelets 117 (*)    All other components within normal limits  BASIC METABOLIC PANEL  CBG MONITORING, ED    Imaging Review Ct Angio Head W/cm &/or Wo Cm  06/25/2015  CLINICAL DATA:  Worsening LEFT-sided weakness and possible seizure, progressive symptoms and stroke in April (RIGHT ACA territory). History of hypertension, cardiomyopathy, atrial fibrillation. EXAM: CT ANGIOGRAPHY HEAD TECHNIQUE: Multidetector CT imaging of the head was performed using the standard protocol during bolus administration of intravenous contrast. Multiplanar CT image reconstructions and MIPs were obtained to evaluate the vascular anatomy. CONTRAST:  75 cc Omnipaque 350 COMPARISON:  CT head June 24, 2015 at 2209 hours and MRA head November 01, 2014 FINDINGS: Anterior  circulation: Normal appearance of the cervical internal carotid arteries, petrous, cavernous and supra clinoid internal carotid arteries. Moderate calcific atherosclerosis with carotid siphons. Mild luminal irregularity RIGHT M2 segments. Widely patent anterior communicating artery. Bilobed aneurysm RIGHT A1-2 junction, with medially and inferiorly directed lobes measuring up to 2 mm. Chronically occluded RIGHT A2 segment. Posterior circulation: RIGHT vertebral artery is dominant with normal appearance of the vertebral arteries, vertebrobasilar junction and basilar artery, as well as main branch vessels. LEFT vertebral artery terminates in the posterior inferior cerebellar artery. Normal appearance of the posterior cerebral arteries. Fetal origin LEFT posterior cerebral artery. Mild to moderate tandem stenosis RIGHT posterior cerebral artery. No hemodynamically significant stenosis, dissection, contrast extravasation within the anterior nor posterior circulation. IMPRESSION: No acute large vessel occlusion with particular attention to RIGHT MCA. Bilobed RIGHT A1-2 junction 4 mm aneurysm. Chronically occluded RIGHT A2 segment. Mild to moderate tendon stenosis RIGHT PCA. Mild luminal irregularity RIGHT MCA most compatible with atherosclerosis. Electronically Signed   By: Awilda Metro M.D.   On: 06/25/2015 00:42   Ct Head Wo Contrast  06/24/2015  CLINICAL DATA:  49 year old male caps Set with seizure and confusion and slurred speech. EXAM: CT HEAD WITHOUT CONTRAST TECHNIQUE: Contiguous axial images were obtained from the base of the skull through the vertex without intravenous contrast. COMPARISON:  Brain MRI dated 11/01/2014 FINDINGS: The ventricles and sulci are appropriate in size for the patient's age. Moderate periventricular and deep white matter hypodensities represent chronic microvascular ischemic changes. An area of old infarct and encephalomalacia noted in the right ACA territory corresponding to the  previously seen infarct. There is no intracranial hemorrhage. No mass effect or midline shift identified. There is high attenuation of the entire A1 segment of the right MCA. This appearance is asymmetric compared to the left side and may represent an MCA thrombus. Correlation with clinical exam and further evaluation with CT angiography or MRA is recommended. The visualized paranasal sinuses and mastoid air cells are well aerated. The calvarium is intact. IMPRESSION: No acute intracranial hemorrhage. Long segment dense right MCA. An MCA thrombus is not excluded. Correlation with clinical exam and further evaluation with CTA or MRA recommended. Moderate diffuse chronic microvascular white matter disease as well as old right ACA territory infarct. Critical Value/emergent results were called by telephone at the time of interpretation on 06/24/2015 at 10:38 pm to Dr. Corena Herter , who verbally acknowledged these results. Electronically Signed   By: Elgie Collard M.D.   On: 06/24/2015 22:38   I have personally reviewed and evaluated these images and lab results as part of my medical decision-making.   EKG Interpretation None      MDM   Final diagnoses:  Seizure (HCC)  Aneurysm (HCC), 4mm   Patient is a 49 year old male past medical history significant for hypertension, CVA (residual left-sided weakness), who presents to the emergency department following a first-time seizure. Not seizing on arrival. Patient back to baseline. Patient is in no acute distress, not ill appearing. Afebrile, hemodynamically stable. Exam as above, notable for neurologic exam that is at his baseline with left upper and lower extremity weakness, no neck stiffness, no signs of meningismus.  Patient most likely with seizures secondary prior CVA. Loaded with 1g IV keppra. Doubt meningitis, no fever, appears well, no signs of meningismus.   Labs obtained, unremarkable. Discussed the patient's CT head with radiology, they noted  a concern for him CA thrombus, recommended CTA versus MRA. I discussed the patient's case with Dr. Lavon PaganiniNandigam with Neurology, recommended CTA to evaluate for acute thrombus. If no acute thrombus recommended continuing keppra 500 mg BID.   Patient's CTA head showed no acute findings. No acute thrombosis. Patient given a prescription for keppra 500 mg BID. Discussed with the patient that he will need to follow up with neurology. Discussed strict return precautions to the emergency department for repeat seizure activity. Patient expressed understanding, no questions or concerns at time of discharge.       Corena HerterShannon Dequincy Born, MD 06/25/15 [id number]  Alvira MondayErin Schlossman, MD 06/27/15 2218

## 2015-06-25 MED ORDER — LEVETIRACETAM 500 MG PO TABS: 500.0000 mg | ORAL_TABLET | Freq: Two times a day (BID) | ORAL | Status: DC

## 2015-06-25 NOTE — ED Notes (Signed)
Pt assisted up to BR, pt did not want to get back into bed although strongly encouraged, pt refused and wanted to sit up in chair.

## 2015-06-25 NOTE — Discharge Instructions (Signed)

## 2015-06-29 ENCOUNTER — Encounter: Payer: Self-pay | Admitting: Cardiology

## 2015-06-29 ENCOUNTER — Ambulatory Visit (INDEPENDENT_AMBULATORY_CARE_PROVIDER_SITE_OTHER): Payer: Medicaid Other | Admitting: Cardiology

## 2015-06-29 VITALS — BP 126/84 | HR 57 | Ht 70.0 in | Wt 185.8 lb

## 2015-06-29 DIAGNOSIS — I119 Hypertensive heart disease without heart failure: Secondary | ICD-10-CM | POA: Diagnosis not present

## 2015-06-29 DIAGNOSIS — G8194 Hemiplegia, unspecified affecting left nondominant side: Secondary | ICD-10-CM

## 2015-06-29 DIAGNOSIS — Z8673 Personal history of transient ischemic attack (TIA), and cerebral infarction without residual deficits: Secondary | ICD-10-CM

## 2015-06-29 DIAGNOSIS — I429 Cardiomyopathy, unspecified: Secondary | ICD-10-CM | POA: Diagnosis not present

## 2015-06-29 MED ORDER — POTASSIUM CHLORIDE CRYS ER 20 MEQ PO TBCR: 40.0000 meq | EXTENDED_RELEASE_TABLET | Freq: Every day | ORAL | Status: AC

## 2015-06-29 MED ORDER — POTASSIUM CHLORIDE CRYS ER 20 MEQ PO TBCR: 40.0000 meq | EXTENDED_RELEASE_TABLET | Freq: Every day | ORAL | Status: DC

## 2015-06-29 MED ORDER — APIXABAN 5 MG PO TABS: 5.0000 mg | ORAL_TABLET | Freq: Two times a day (BID) | ORAL | Status: DC

## 2015-06-29 NOTE — Patient Instructions (Signed)

## 2015-06-29 NOTE — Progress Notes (Signed)
Cardiology Office Note   Date:  06/29/2015   ID:  [name] Short, DOB [birth year], MRN [id number]  PCP:  [name], MD  Cardiologist:  Seen by Dr. Hillis Range in the hospital >> now [name] Schultz, MD      History of Present Illness: [name] Short is a 49 y.o. male with a hx of untreated HTN, marijuana use who was admitted 10/2014 with an acute R brain CVA.  He was treated with tPA.  He continued to have L sided weakness.  He had an echo that demonstrated EF 35-40%. After medical therapy, his ejection fraction is now 55-60% in July 2016.  TEE did not demonstrate embolic source.    Renal artery [name] Short was neg for RAS.  DCM was felt to be related to HTN heart disease because of his response to antihypertensives.      He returns for FU.  He is here today with his wife.  He is doing fairly well.  His speech remains impaired.  He feels he is improving and he has finished PT.  He denies chest pain,dyspnea, orthopnea, PND, edema.  No syncope.  He has a seizure last week and was evaluated in ED.  Keppra was started and he will follow-up with neuro next month.  Main complaint is itchy feet.   Studies/Reports Reviewed Today:  Renal Art [name] Short 11/04/14 - No evidence of renal artery stenosis noted bilaterally. - Bilateral normal intrarenal resistive indices.  TEE 11/03/14 Findings: Please see echo section for full report. Normal left ventricular size with moderate LV hypertrophy. Diffuse hypokinesis with EF 35%. Normal RV size with mildly decreased systolic function. Trivial mitral regurgitation. Trivial tricuspid regurgitation. Trileaflet aortic valve with no stenosis and mild regurgitation. Mildly dilated left atrium, no LAA thrombus but there was smoke in the appendage. Normal caliber aorta with mild plaque. Negative bubble study, no ASD or PFO.  No definite source of embolus. EF 35% with moderate LVH and smoke but no thrombus in LA appendage  Echo 10/31/14 - Left ventricle: The cavity size  was mildly dilated. Wall thickness was increased in a pattern of severe LVH. Systolic function was moderately reduced. EF 35% to 40%. Diffuse hypokinesis. Doppler parameters are consistent with elevated ventricular end-diastolic filling pressure. - Left atrium: The atrium was moderately dilated. - Atrial septum: No defect or patent foramen ovale was identified. - Pulmonary arteries: PA peak pressure: 45 mm Hg (S). - Impressions: In the absence of HTN or renal failure cannot r/o amyloid.  Echocardiogram 02/01/15-ejection fraction 55-60%  Carotid [name] Short 11/01/14 Bilateral: 1-39% ICA stenosis. Vertebral artery flow is antegrade.   Past Medical History  Diagnosis Date  . Hypertension     a. Previously on meds but none in several years.  . Smoker     a. Previous tobacco - quit ~ 2013. Ongoing daily marijuana usage. Occasional cigar.  . Stroke [facility])     a. 10/2014 MRI Head: acute mod size nonhemorrhagic R ant cerebral artery distribution infarct involving the medial aspect of the right frontal and parietal lobe and right aspect of the corpus callosum w/ local mass effect-->TPA;  b. 10/2014 MRA: Abrupt cut off of the R ACA A2 segment;  b. 10/2014 Carotid U/S: 1-39% bilat ICA stenosis.  . Cardiomyopathy (HCC)     a. 10/2014 Echo: EF 35-40%, diff HK, sev LVH, mod dil LA, PASP .;  b.  Echo 7/16: Severe LVH, EF 55-60%, aortic sclerosis without stenosis, mild AI, trivial MR, severe BAE  Past Surgical History  Procedure Laterality Date  . Tee without cardioversion N/A 11/03/2014    Procedure: TRANSESOPHAGEAL ECHOCARDIOGRAM (TEE);  Surgeon: Laurey Morale, MD;  Location[facility] ENDOSCOPY;  Service: Cardiovascular;  Laterality: N/A;     Current Outpatient Prescriptions  Medication Sig Dispense Refill  . amLODipine (NORVASC) 10 MG tablet TAKE 1 TABLET BY MOUTH DAILY 30 tablet 1  . apixaban (ELIQUIS) 5 MG TABS tablet Take 1 tablet (5 mg total) by mouth 2 (two) times daily. 60 tablet 6  .  atorvastatin (LIPITOR) 40 MG tablet TAKE 1 TABLET BY MOUTH DAILY AT 6 PM. 30 tablet 2  . carvedilol (COREG) 25 MG tablet Take 1 tablet (25 mg total) by mouth 2 (two) times daily with a meal. 60 tablet 11  . diclofenac sodium (VOLTAREN) 1 % GEL Apply 2 g topically 4 (four) times daily. 1 Tube 1  . hydrochlorothiazide (HYDRODIURIL) 12.5 MG tablet Take 1 tablet (12.5 mg total) by mouth daily. 30 tablet 2  . levETIRAcetam (KEPPRA) 500 MG tablet Take 1 tablet (500 mg total) by mouth 2 (two) times daily. 32 tablet 0  . lisinopril (PRINIVIL,ZESTRIL) 40 MG tablet Take 1 tablet (40 mg total) by mouth daily. 30 tablet 5  . potassium chloride SA (K-DUR,KLOR-CON) 20 MEQ tablet Take 2 tablets (40 mEq total) by mouth daily. On first day, take 4 tablets (total dose = 80 mEq), thereafter take 2 tablets (40 mEq dose) daily. 186 tablet 1  . traZODone (DESYREL) 50 MG tablet Take 1 tablet (50 mg total) by mouth at bedtime as needed for sleep. 30 tablet 11   No current facility-administered medications for this visit.    Allergies:   Review of patient's allergies indicates no known allergies.    Social History:  The patient  reports that he quit smoking about 7 months ago. His smoking use included Cigars. He does not have any smokeless tobacco history on file. He reports that he drinks alcohol. He reports that he uses illicit drugs.   Family History:  The patient's family history includes Diabetes in his mother; Heart attack in his father; Heart disease in his father; Hypertension in his brother, father, and sister; Other in an other family member; Stroke in his sister.    ROS:   Please see the history of present illness.   Review of Systems  All other systems reviewed and are negative.    PHYSICAL EXAM: VS:  BP 126/84 mmHg  Pulse 57  Ht  (1.778 m)  Wt 185 lb 12.8 oz (84.278 kg)  BMI 26.66 kg/m2  SpO2 99%    Wt Readings from Last 3 Encounters:  06/29/15 185 lb 12.8 oz (84.278 kg)  03/15/15 179  lb (81.194 kg)  03/15/15 180 lb (81.647 kg)     GEN: Well nourished, well developed, in no acute distress HEENT: normal Neck: no JVD,   no masses Cardiac:  Normal S1/S2, irregular irregular rhythm; no murmur , no rubs or gallops, no edema   Respiratory:  clear to auscultation bilaterally, no wheezing, rhonchi or rales. GI: soft, nontender, nondistended, + BS MS: no deformity or atrophy Skin: dry lower legs with excoriations Psych: Normal affect   EKG:  EKG prior AFib, HR 75, LAD, NSSTTW changes   Recent Labs: 03/15/2015: ALT 16 06/24/2015: BUN 19; Creatinine, Ser 1.19; Hemoglobin 13.5; Platelets 117*; Potassium 4.1; Sodium 138    Lipid Panel    Component Value Date/Time   CHOL 164 11/01/2014 0601   TRIG  72 11/01/2014 0601   HDL 28* 11/01/2014 0601   CHOLHDL 5.9 11/01/2014 0601   VLDL 14 11/01/2014 0601   LDLCALC 122* 11/01/2014 0601      ASSESSMENT AND PLAN:  Atrial Fibrillation:  This patients CHA2DS2-VASc Score and unadjusted Ischemic Stroke Rate (% per year) is equal to 4.8 % stroke rate/year from a score of 4.  Above score calculated as 1 point each if present [CHF, HTN, DM, Vascular=MI/PAD/Aortic Plaque, Age if 65-74, or Male].  Above score calculated as 2 points each if present [Age > 75, or Stroke/TIA/TE].  Continue A/C with Eliquis 5 mg twice a day (age less than 80, creatinine less than 1.5, weight greater than 60 kg).  He reports compliance with eliquis and denies bleeding.  His HR is controlled on BB.   Dilated cardiomyopathy-resolved:  Probably related to untreated HTN given the fact that his ejection fraction has improved back to 55-60% after treatment of blood pressure.  No source of embolus on TEE.  Continue beta-blocker, ACE inhibitor.  He is not having any symptoms to suggest ischemia, I will hold off on proceeding with stress testing.  Hypertensive heart disease without heart failure:  Continue blood pressure control.  Hyperlipidemia LDL goal <70:   Continue statin.    History of stroke:  As he is in atrial fibrillation today, he does not need an event monitor. Anticoagulation as discussed above. Follow-up with neurology as planned   Seizure - 1 event on 06/24/15, no new CVA on CT.  This is likely related to old CVA.  Patient has been started on Keppra and due for follow-up with neurology (Dr. Karel JarvisAquino) on 08/03/15.  Tolerating Keppra w/o seizure recurrence.  Aneurysm - bilobed right A1-2 junction 4mm aneurysm seen CT on 06/24/15.  He will follow-up with neuro, Dr. Karel JarvisAquino on 08/03/15.  Cigarette smoker:  He has quit smoking.  Tenia pedis. He has tried several over-the-counter remedies but they do not seem to be working well. I have encouraged him to discuss this further with his primary doctor. They will be seeing them soon. Actually, this is his main complaint today.   Current medicines are reviewed at length with the patient today.  Concerns regarding medicines are as outlined above.  The following changes have been made:  Increased carvedilol to 25 mg twice a day    Labs/ tests ordered today include:  No orders of the defined types were placed in this encounter.     He will come back and see Lorin PicketScott or myself in 4 months.  [name] SchultzSKAINS, Fayne Mcguffee, MD

## 2015-07-05 ENCOUNTER — Ambulatory Visit: Payer: Medicaid Other | Admitting: Cardiology

## 2015-07-09 ENCOUNTER — Other Ambulatory Visit: Payer: Self-pay | Admitting: Physician Assistant

## 2015-07-13 ENCOUNTER — Telehealth: Payer: Self-pay | Admitting: Family Medicine

## 2015-07-13 NOTE — Telephone Encounter (Signed)
Patient called requesting a medication refill for levETIRAcetam (KEPPRA) Please follow up.

## 2015-07-14 ENCOUNTER — Other Ambulatory Visit: Payer: Self-pay | Admitting: *Deleted

## 2015-07-14 ENCOUNTER — Ambulatory Visit (INDEPENDENT_AMBULATORY_CARE_PROVIDER_SITE_OTHER): Payer: Medicaid Other | Admitting: Neurology

## 2015-07-14 ENCOUNTER — Encounter: Payer: Self-pay | Admitting: Neurology

## 2015-07-14 VITALS — BP 134/82 | HR 72 | Ht 70.0 in | Wt 186.0 lb

## 2015-07-14 DIAGNOSIS — G40209 Localization-related (focal) (partial) symptomatic epilepsy and epileptic syndromes with complex partial seizures, not intractable, without status epilepticus: Secondary | ICD-10-CM

## 2015-07-14 DIAGNOSIS — Z8673 Personal history of transient ischemic attack (TIA), and cerebral infarction without residual deficits: Secondary | ICD-10-CM | POA: Diagnosis not present

## 2015-07-14 DIAGNOSIS — I671 Cerebral aneurysm, nonruptured: Secondary | ICD-10-CM | POA: Insufficient documentation

## 2015-07-14 MED ORDER — LEVETIRACETAM 500 MG PO TABS: 500.0000 mg | ORAL_TABLET | Freq: Two times a day (BID) | ORAL | Status: DC

## 2015-07-14 MED ORDER — LEVETIRACETAM 500 MG PO TABS
500.0000 mg | ORAL_TABLET | Freq: Two times a day (BID) | ORAL | Status: DC
Start: 2015-07-14 — End: 2015-07-14

## 2015-07-14 NOTE — Progress Notes (Signed)
NEUROLOGY CONSULTATION NOTE  [name] Short MRN: [id number] DOB: 01-14-1966  Referring provider: Dr. Dessa Phi Primary care provider: Dr. Dessa Phi  Reason for consult:  New onset seizure  Dear Dr Armen Pickup:  Thank you for your kind referral of [name] Short for consultation of the above symptoms. Although his history is well known to you, please allow me to reiterate it for the purpose of our medical record. The patient was accompanied to the clinic by his girlfriend who also provides collateral information. Records and images were personally reviewed where available.  HISTORY OF PRESENT ILLNESS: This is a 49 year old right-handed man with a history of hypertension, paroxysmal atrial fibrillation on Eliquis, right ACA stroke last April 2016 with residual mild left leg weakness, presenting after ER visit for new onset seizure last 06/24/15. He was at home and started having uncontrollable left leg shaking. He called to his girlfriend, who found him unresponsive and having a generalized tonic-clonic seizure lasting 3 minutes. He reports biting his tongue, no incontinence. He was on the couch and did not fall. He denied worsening left-sided weakness after the seizure. He was brought to [facility] ER where he was back to baseline. Bloodwork showed normal CBC, BMP. No urine drug screen done. I personally reviewed head CT without contrast which showed old infarct and encephalomalacia in the right ACA distribution. There was concern for high attenuation of the entire A1 segment of the right MCA. A follow-up CTA head did not show any large vessel occlusion. There was a chronically occluded right A2 segment, and mild to moderate stenosis of the right PCA, mild luminal irregularity in the right MCA. 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 He was given IV Keppra and discharged home with 32 tablets of Keppra, which he ran out of last Saturday. He denies any side effects on the Keppra,  no further seizures. He and his girlfriend denies any staring/unresponsive episodes, no gaps in time, olfactory/gustatory hallucinations, deja vu, rising epigastric sensation, focal numbness/tingling/weakness, myoclonic jerks.  He has occasional "regular" headaches occurring every 3-4 days for the past few month, no associated nausea, vomiting, photo/phonophobia, with good response to Ibuprofen. He has not been getting good sleep, with only 4-5 hours of sleep. He denies any worsened sleep deprivation prior to the seizure, no alcohol intake. He denies any dizziness, diplopia, dysarthria, dysphagia, neck/back pain, bowel/bladder dysfunction. He has some itching behind both legs. Records from April 2016 were reviewed, at that time he had sudden onset left leg weakness and altered mental status, with BP of 220/110. He received IV-TPA. I personally reviewed MRI brain at that time which showed acute right ACA infarct, MRA showed aplastic A1 segment of the left ACA with A2 segment of the ACA supplied from the right bilaterally, irregularity of the A1 segment of the ACA bilaterally with abrupt cut off of flor of the A2 segment of right ACA. Carotid dopplers unremarkable, echo showed EF of 35-40% with diffuse hypokinesis, venous doppler negative for DVT, TEE showed EF of 35% without thrombus. Hypercoagulable panel negative. He was found to be in paroxysmal atrial fibrillation and has been taking Eliquis since then. He has had improvement in left leg weakness (initially flaccid per girlfriend), he is able to walk but with distal left leg weakness, given an AFO which he has not been using.   Epilepsy Risk Factors:  Old right ACA stroke. Otherwise he had a normal birth and early development.  There is no history of  febrile convulsions, CNS infections such as meningitis/encephalitis, significant traumatic brain injury, neurosurgical procedures, or family history of seizures.  PAST MEDICAL HISTORY: Past Medical History    Diagnosis Date  . Hypertension     a. Previously on meds but none in several years.  . Smoker     a. Previous tobacco - quit ~ 2013. Ongoing daily marijuana usage. Occasional cigar.  . Stroke [facility])     a. 10/2014 MRI Head: acute mod size nonhemorrhagic R ant cerebral artery distribution infarct involving the medial aspect of the right frontal and parietal lobe and right aspect of the corpus callosum w/ local mass effect-->TPA;  b. 10/2014 MRA: Abrupt cut off of the R ACA A2 segment;  b. 10/2014 Carotid U/S: 1-39% bilat ICA stenosis.  . Cardiomyopathy (HCC)     a. 10/2014 Echo: EF 35-40%, diff HK, sev LVH, mod dil LA, PASP .;  b.  Echo 7/16: Severe LVH, EF 55-60%, aortic sclerosis without stenosis, mild AI, trivial MR, severe BAE    PAST SURGICAL HISTORY: Past Surgical History  Procedure Laterality Date  . Tee without cardioversion N/A 11/03/2014    Procedure: TRANSESOPHAGEAL ECHOCARDIOGRAM (TEE);  Surgeon: Laurey Morale, MD;  Location: [facility] ENDOSCOPY;  Service: Cardiovascular;  Laterality: N/A;    MEDICATIONS: Current Outpatient Prescriptions on File Prior to Visit  Medication Sig Dispense Refill  . amLODipine (NORVASC) 10 MG tablet TAKE 1 TABLET BY MOUTH DAILY 30 tablet 1  . apixaban (ELIQUIS) 5 MG TABS tablet Take 1 tablet (5 mg total) by mouth 2 (two) times daily. 180 tablet 3  . atorvastatin (LIPITOR) 40 MG tablet TAKE 1 TABLET BY MOUTH DAILY AT 6 PM. 30 tablet 2  . carvedilol (COREG) 25 MG tablet Take 1 tablet (25 mg total) by mouth 2 (two) times daily with a meal. 60 tablet 11  . diclofenac sodium (VOLTAREN) 1 % GEL Apply 2 g topically 4 (four) times daily. 1 Tube 1  . hydrochlorothiazide (HYDRODIURIL) 12.5 MG tablet Take 1 tablet (12.5 mg total) by mouth daily. 30 tablet 2  . levETIRAcetam (KEPPRA) 500 MG tablet Take 1 tablet (500 mg total) by mouth 2 (two) times daily. 32 tablet 0  . lisinopril (PRINIVIL,ZESTRIL) 40 MG tablet Take 1 tablet (40 mg total) by mouth daily. 30  tablet 5  . potassium chloride SA (K-DUR,KLOR-CON) 20 MEQ tablet Take 2 tablets (40 mEq total) by mouth daily. 180 tablet 3  . traZODone (DESYREL) 50 MG tablet Take 1 tablet (50 mg total) by mouth at bedtime as needed for sleep. 30 tablet 11   No current facility-administered medications on file prior to visit.    ALLERGIES: No Known Allergies  FAMILY HISTORY: Family History  Problem Relation Age of Onset  . Other      no premature cad  . Diabetes Mother   . Heart disease Father   . Heart attack Father   . Stroke Sister   . Hypertension Father   . Hypertension Brother   . Hypertension Sister     SOCIAL HISTORY: Social History   Social History  . Marital Status: Single    Spouse Name: N/A  . Number of Children: 3  . Years of Education: 12   Occupational History  . Unemployed    Social History Main Topics  . Smoking status: Current Every Day Smoker    Types: Cigars  . Smokeless tobacco: Never Used     Comment: previously smoked cigarettes - quit ~ 2013.  Marland Kitchen Alcohol  Use: No     Comment: Alcohol use stopped in April 2016.  . Drug Use: Yes     Comment: Marijuana daily. Has not had marijuana since being in hospital.  . Sexual Activity: Not on file   Other Topics Concern  . Not on file   Social History Narrative   [place] in [place] with his GF.  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   Right-handed.   Occasional caffeine use.    REVIEW OF SYSTEMS: Constitutional: No fevers, chills, or sweats, no generalized fatigue, change in appetite Eyes: No visual changes, double vision, eye pain Ear, nose and throat: No hearing loss, ear pain, nasal congestion, sore throat Cardiovascular: No chest pain, palpitations Respiratory:  No shortness of breath at rest or with exertion, wheezes GastrointestinaI: No nausea, vomiting, diarrhea, abdominal pain, fecal incontinence Genitourinary:  No dysuria, urinary retention or frequency Musculoskeletal:  No neck pain, back  pain Integumentary: No rash, pruritus, skin lesions Neurological: as above Psychiatric: No depression, insomnia, anxiety Endocrine: No palpitations, fatigue, diaphoresis, mood swings, change in appetite, change in weight, increased thirst Hematologic/Lymphatic:  No anemia, purpura, petechiae. Allergic/Immunologic: no itchy/runny eyes, nasal congestion, recent allergic reactions, rashes  PHYSICAL EXAM: Filed Vitals:   07/14/15 0854  BP: 134/82  Pulse: 72   General: No acute distress Head:  Normocephalic/atraumatic Eyes: Fundoscopic exam shows bilateral sharp discs, no vessel changes, exudates, or hemorrhages Neck: supple, no paraspinal tenderness, full range of motion Back: No paraspinal tenderness Heart: regular rate and rhythm Lungs: Clear to auscultation bilaterally. Vascular: No carotid bruits. Skin/Extremities: No rash, no edema Neurological Exam: Mental status: alert and oriented to person, place, and time, no dysarthria or aphasia, Fund of knowledge is appropriate.  Recent and remote memory are intact. 2/3 delayed recall. Attention and concentration are normal.    Able to name objects and repeat phrases. Cranial nerves: CN I: not tested CN II: pupils equal, round and reactive to light, visual fields intact, fundi unremarkable. CN III, IV, VI:  full range of motion, no nystagmus, no ptosis CN V: decreased cold on left V2, intact to pin and light touch CN VII: upper and lower face symmetric CN VIII: hearing intact to finger rub CN IX, X: gag intact, uvula midline CN XI: sternocleidomastoid and trapezius muscles intact CN XII: tongue midline Bulk & Tone: normal, no fasciculations. Motor: 5/5 on right UE and LE, left UE, left proximal LE, 4/5 left knee flexion and foot extension Sensation: decreased cold on left LE, increased pin on left LE, intact vibration and joint position sense.  No extinction to double simultaneous stimulation.  Romberg test negative Deep Tendon  Reflexes: +2 throughout, no ankle clonus Plantar responses: downgoing bilaterally Cerebellar: no incoordination on finger to nose Gait: left foot weakness, walking on tiptoe due to foot drop. Unable to tandem walk Tremor: none  IMPRESSION: This is a 49 year old right-handed man with a history of right ACA stroke in April 2016 due to paroxysmal atrial fibrillation, now on Eliquis, presenting with new onset seizure last 06/24/15 suggestive of focal seizure that secondarily generalized arising from old stroke. He ran out of Keppra from the ER 4 days ago. Agree with Keppra, refills for Keppra 500mg  BID were sent today. A routine EEG will be ordered to complete seizure workup. [place] driving laws were discussed with the patient, and he knows to stop driving after a seizure, until 6 months seizure-free. We discussed the small 4mm aneurysm incidentally seen on CTA done 06/24/15,  and low annual rupture risk, but the importance of controlling BP and smoking cessation. A follow-up CTA will be ordered in 1 year. They know to go to ER if symptoms change or worsen. He will follow-up in 3 months.   Thank you for allowing me to participate in the care of this patient. Please do not hesitate to call for any questions or concerns.   Patrcia Dolly, M.D.  CC: Dr. Armen Pickup

## 2015-07-14 NOTE — Patient Instructions (Signed)
1. Schedule routine EEG 2. Restart Keppra 500mg  twice a day 3. It is important to control blood pressure and stop smoking in patients with aneurysms 4. Follow-up in 3 months, call for any changes  Seizure Precautions: 1. If medication has been prescribed for you to prevent seizures, take it exactly as directed.  Do not stop taking the medicine without talking to your doctor first, even if you have not had a seizure in a long time.   2. Avoid activities in which a seizure would cause danger to yourself or to others.  Don't operate dangerous machinery, swim alone, or climb in high or dangerous places, such as on ladders, roofs, or girders.  Do not drive unless your doctor says you may.  3. If you have any warning that you may have a seizure, lay down in a safe place where you can't hurt yourself.    4.  No driving for 6 months from last seizure, as per [facility] [place].   Please refer to the following link on the Epilepsy Foundation of America's website for more information: http://www.epilepsyfoundation.org/answerplace/Social/driving/drivingu.cfm   5.  Maintain good sleep hygiene. Avoid alcohol.  6.  Contact your doctor if you have any problems that may be related to the medicine you are taking.  7.  Call 911 and bring the patient back to the ED if:        A.  The seizure lasts longer than 5 minutes.       B.  The patient doesn't awaken shortly after the seizure  C.  The patient has new problems such as difficulty seeing, speaking or moving  D.  The patient was injured during the seizure  E.  The patient has a temperature over 102 F (39C)  F.  The patient vomited and now is having trouble breathing

## 2015-07-15 ENCOUNTER — Ambulatory Visit: Payer: Medicaid Other | Attending: Family Medicine | Admitting: Family Medicine

## 2015-07-15 ENCOUNTER — Encounter: Payer: Self-pay | Admitting: Family Medicine

## 2015-07-15 ENCOUNTER — Encounter (HOSPITAL_BASED_OUTPATIENT_CLINIC_OR_DEPARTMENT_OTHER): Payer: Medicaid Other | Admitting: Clinical

## 2015-07-15 VITALS — BP 135/75 | HR 67 | Temp 98.3°F | Resp 16 | Ht 70.0 in | Wt 186.0 lb

## 2015-07-15 DIAGNOSIS — F17200 Nicotine dependence, unspecified, uncomplicated: Secondary | ICD-10-CM | POA: Diagnosis not present

## 2015-07-15 DIAGNOSIS — R569 Unspecified convulsions: Secondary | ICD-10-CM | POA: Insufficient documentation

## 2015-07-15 DIAGNOSIS — I671 Cerebral aneurysm, nonruptured: Secondary | ICD-10-CM | POA: Insufficient documentation

## 2015-07-15 DIAGNOSIS — I10 Essential (primary) hypertension: Secondary | ICD-10-CM | POA: Insufficient documentation

## 2015-07-15 DIAGNOSIS — F418 Other specified anxiety disorders: Secondary | ICD-10-CM

## 2015-07-15 DIAGNOSIS — Z7901 Long term (current) use of anticoagulants: Secondary | ICD-10-CM | POA: Insufficient documentation

## 2015-07-15 DIAGNOSIS — F419 Anxiety disorder, unspecified: Principal | ICD-10-CM

## 2015-07-15 DIAGNOSIS — B353 Tinea pedis: Secondary | ICD-10-CM | POA: Insufficient documentation

## 2015-07-15 DIAGNOSIS — F329 Major depressive disorder, single episode, unspecified: Secondary | ICD-10-CM

## 2015-07-15 DIAGNOSIS — I63521 Cerebral infarction due to unspecified occlusion or stenosis of right anterior cerebral artery: Secondary | ICD-10-CM | POA: Insufficient documentation

## 2015-07-15 DIAGNOSIS — Z0000 Encounter for general adult medical examination without abnormal findings: Secondary | ICD-10-CM

## 2015-07-15 DIAGNOSIS — F0631 Mood disorder due to known physiological condition with depressive features: Secondary | ICD-10-CM | POA: Diagnosis not present

## 2015-07-15 DIAGNOSIS — B351 Tinea unguium: Secondary | ICD-10-CM | POA: Insufficient documentation

## 2015-07-15 DIAGNOSIS — Z79899 Other long term (current) drug therapy: Secondary | ICD-10-CM | POA: Insufficient documentation

## 2015-07-15 DIAGNOSIS — I639 Cerebral infarction, unspecified: Secondary | ICD-10-CM | POA: Insufficient documentation

## 2015-07-15 DIAGNOSIS — IMO0002 Reserved for concepts with insufficient information to code with codable children: Secondary | ICD-10-CM

## 2015-07-15 DIAGNOSIS — G40209 Localization-related (focal) (partial) symptomatic epilepsy and epileptic syndromes with complex partial seizures, not intractable, without status epilepticus: Secondary | ICD-10-CM | POA: Insufficient documentation

## 2015-07-15 MED ORDER — LISINOPRIL 40 MG PO TABS: 40.0000 mg | ORAL_TABLET | Freq: Every day | ORAL | Status: DC

## 2015-07-15 MED ORDER — HYDROCHLOROTHIAZIDE 12.5 MG PO TABS: 12.5000 mg | ORAL_TABLET | Freq: Every day | ORAL | Status: DC

## 2015-07-15 MED ORDER — KETOCONAZOLE 2 % EX CREA: 1.0000 "application " | TOPICAL_CREAM | Freq: Every day | CUTANEOUS | Status: DC

## 2015-07-15 MED ORDER — AMLODIPINE BESYLATE 10 MG PO TABS: 10.0000 mg | ORAL_TABLET | Freq: Every day | ORAL | Status: DC

## 2015-07-15 MED ORDER — ATORVASTATIN CALCIUM 40 MG PO TABS: 40.0000 mg | ORAL_TABLET | Freq: Every day | ORAL | Status: DC

## 2015-07-15 NOTE — Assessment & Plan Note (Signed)
Followed by neurology.   

## 2015-07-15 NOTE — Progress Notes (Signed)
Subjective:  Patient ID: [name] Short, male    DOB: 1966/05/28  Age: 49 y.o. MRN: [id number]  CC: Seizures and Nail Problem   HPI [name] ColeCarl Depree presents for    1. Seizures: he had first seizure on June 24 2015. At home, witnessed by significant other who is here today. He stared off for 3 minutes and was shaking. He went to ED.  CT head done with no acute findings. There was a 4 mm aneurysm. He was started on keppra. He saw neurology yesterday. Keppra was refilled with plan for EEG and f/u CTA in one year. He remains seizure free.   2. Stroke: no new weakness or deficit. He request stroke support group. He declines antidepressant.   2. Foot fungus: he has itching and between toes. He has thickened and yellow toenails that are difficult to cut.  He is using gold bond powder.   Social History  Substance Use Topics  . Smoking status: Current Every Day Smoker    Types: Cigars  . Smokeless tobacco: Never Used     Comment: previously smoked cigarettes - quit ~ 2013.  Marland Kitchen. Alcohol Use: No     Comment: Alcohol use stopped in April 2016.    Outpatient Prescriptions Prior to Visit  Medication Sig Dispense Refill  . amLODipine (NORVASC) 10 MG tablet TAKE 1 TABLET BY MOUTH DAILY 30 tablet 1  . apixaban (ELIQUIS) 5 MG TABS tablet Take 1 tablet (5 mg total) by mouth 2 (two) times daily. 180 tablet 3  . atorvastatin (LIPITOR) 40 MG tablet TAKE 1 TABLET BY MOUTH DAILY AT 6 PM. 30 tablet 2  . carvedilol (COREG) 25 MG tablet Take 1 tablet (25 mg total) by mouth 2 (two) times daily with a meal. 60 tablet 11  . diclofenac sodium (VOLTAREN) 1 % GEL Apply 2 g topically 4 (four) times daily. 1 Tube 1  . hydrochlorothiazide (HYDRODIURIL) 12.5 MG tablet Take 1 tablet (12.5 mg total) by mouth daily. 30 tablet 2  . levETIRAcetam (KEPPRA) 500 MG tablet Take 1 tablet (500 mg total) by mouth 2 (two) times daily. 60 tablet 11  . lisinopril (PRINIVIL,ZESTRIL) 40 MG tablet Take 1 tablet (40 mg total) by mouth  daily. 30 tablet 5  . potassium chloride SA (K-DUR,KLOR-CON) 20 MEQ tablet Take 2 tablets (40 mEq total) by mouth daily. 180 tablet 3  . traZODone (DESYREL) 50 MG tablet Take 1 tablet (50 mg total) by mouth at bedtime as needed for sleep. 30 tablet 11   No facility-administered medications prior to visit.    ROS Review of Systems  Constitutional: Negative for fever, chills, fatigue and unexpected weight change.  Eyes: Negative for visual disturbance.  Respiratory: Negative for cough and shortness of breath.   Cardiovascular: Negative for chest pain, palpitations and leg swelling.  Gastrointestinal: Negative for nausea, vomiting, abdominal pain, diarrhea, constipation and blood in stool.  Endocrine: Negative for polydipsia, polyphagia and polyuria.  Musculoskeletal: Negative for myalgias, back pain, arthralgias, gait problem and neck pain.  Skin: Negative for rash.  Allergic/Immunologic: Negative for immunocompromised state.  Neurological: Negative for seizures.  Hematological: Negative for adenopathy. Does not bruise/bleed easily.  Psychiatric/Behavioral: Negative for suicidal ideas, sleep disturbance and dysphoric mood. The patient is not nervous/anxious.    Objective:  BP 135/75 mmHg  Pulse 67  Temp(Src) 98.3 F (36.8 C) (Oral)  Resp 16  Ht 5\' 10"  (1.778 m)  Wt 186 lb (84.369 kg)  BMI 26.69 kg/m2  SpO2 100%  BP/Weight  07/15/2015 07/14/2015 06/29/2015  Systolic BP 135 134 126  Diastolic BP 75 82 84  Wt. (Lbs) 186 186 185.8  BMI 26.69 26.69 26.66   Physical Exam  Constitutional: He appears well-developed and well-nourished. No distress.  HENT:  Head: Normocephalic and atraumatic.  Neck: Normal range of motion. Neck supple.  Cardiovascular: Normal rate, regular rhythm, normal heart sounds and intact distal pulses.   Pulmonary/Chest: Effort normal and breath sounds normal.  Musculoskeletal: He exhibits no edema.  Neurological: He is alert.  Skin: Skin is warm and dry. No  rash noted. No erythema.  Psychiatric: He has a normal mood and affect.   Assessment & Plan:   Problem List Items Addressed This Visit    Acute right anterior cerebral artery (ACA) ischemic stroke (Chronic)   Relevant Medications   hydrochlorothiazide (HYDRODIURIL) 12.5 MG tablet   amLODipine (NORVASC) 10 MG tablet   atorvastatin (LIPITOR) 40 MG tablet   lisinopril (PRINIVIL,ZESTRIL) 40 MG tablet   Depression due to stroke [facility])    Patient referred to CSW for support group information       Essential hypertension (Chronic)   Relevant Medications   hydrochlorothiazide (HYDRODIURIL) 12.5 MG tablet   amLODipine (NORVASC) 10 MG tablet   atorvastatin (LIPITOR) 40 MG tablet   lisinopril (PRINIVIL,ZESTRIL) 40 MG tablet   Onychomycosis of toenail   Relevant Medications   ketoconazole (NIZORAL) 2 % cream   hydrochlorothiazide (HYDRODIURIL) 12.5 MG tablet   Other Relevant Orders   Ambulatory referral to Podiatry   Tinea pedis   Relevant Medications   ketoconazole (NIZORAL) 2 % cream    Other Visit Diagnoses    Healthcare maintenance    -  Primary    Relevant Orders    Flu Vaccine QUAD 36+ mos IM (Completed)       No orders of the defined types were placed in this encounter.    Follow-up: No Follow-up on file.   Dessa Phi MD

## 2015-07-15 NOTE — Assessment & Plan Note (Signed)
Patient referred to CSW for support group information

## 2015-07-15 NOTE — Progress Notes (Signed)
F/U ED Sz  Pain on feet, possible fungal infection  Pain scale #6 Tobacco user  No suicidal thought in the past two week

## 2015-07-15 NOTE — Progress Notes (Signed)
ASSESSMENT: Pt currently experiencing symptoms of anxiety and depression, likely begun as result of stroke. Pt needs to f/u with PCP, and would benefit from psychoeducation and supportive counseling, as well as group support, regarding coping with symptoms of anxiety and depression, as a result of stroke.  Stage of Change: contemplative  PLAN: 1. F/U with behavioral health consultant in as needed 2. Psychiatric Medications: Trazodone. 3. Behavioral recommendation(s):   -Consider reading educational material regarding coping with symptoms of depression and anxiety -Consider stroke support group -Consider applying for Medicaid transportation  SUBJECTIVE: Pt. referred by Dr Armen PickupFunches for stroke support:  Pt. reports the following symptoms/concerns:Pt inquired about finding transportation to get to medical appointments and stroke support group.  After taking the PHQ9/GAD7, patient states:  "I just want to talk sometime"; then his girlfriend stood up, appeared irritated, and stated "I'm leaving", and left the building. Pt did not check out, but left the room and building as well, saying "I better go, she's my ride".  Duration of problem: stroke inquiry, at least one day (depression/anxiety symptoms since stroke, about 8.5 months) Severity: mild (anxiety/depression, moderate)  OBJECTIVE: Orientation & Cognition: Oriented x3. Thought processes normal and appropriate to situation. Mood: low. Affect: appropriate Appearance: appropriate (for post stroke, slowed) Risk of harm to self or others: no known risk of harm to self or others Substance use: none Assessments administered: PHQ9: 10/ GAD7: 12  Diagnosis: Anxiety and depression CPT Code: F41.8 -------------------------------------------- Other(s) present in the room: girlfriend  Time spent with patient in exam room: 16 minutes

## 2015-07-15 NOTE — Patient Instructions (Addendum)
[name] Short was seen today for seizures and nail problem.  Diagnoses and all orders for this visit:  Healthcare maintenance -     Cancel: Flu Vaccine QUAD 36+ mos IM  Tinea pedis of both feet -     ketoconazole (NIZORAL) 2 % cream; Apply 1 application topically daily.  Onychomycosis of toenail -     hydrochlorothiazide (HYDRODIURIL) 12.5 MG tablet; Take 1 tablet (12.5 mg total) by mouth daily. -     Ambulatory referral to Podiatry  Essential hypertension -     amLODipine (NORVASC) 10 MG tablet; Take 1 tablet (10 mg total) by mouth daily. -     lisinopril (PRINIVIL,ZESTRIL) 40 MG tablet; Take 1 tablet (40 mg total) by mouth daily.  Acute right anterior cerebral artery (ACA) ischemic stroke -     atorvastatin (LIPITOR) 40 MG tablet; Take 1 tablet (40 mg total) by mouth daily at 6 PM.   Smoking cessation support: smoking cessation hotline: 1-800-QUIT-NOW.  Smoking cessation classes are available through [facility]. Call [phone] or visit our website at [email or web address].   F/u in 3 months for HTN  Dr. Armen PickupFunches

## 2015-07-20 NOTE — Telephone Encounter (Signed)
Pt advised to call pharmacy for refills Stated did pickup Rx  on Friday

## 2015-07-28 ENCOUNTER — Ambulatory Visit (INDEPENDENT_AMBULATORY_CARE_PROVIDER_SITE_OTHER): Payer: Medicaid Other | Admitting: Neurology

## 2015-07-28 DIAGNOSIS — G40209 Localization-related (focal) (partial) symptomatic epilepsy and epileptic syndromes with complex partial seizures, not intractable, without status epilepticus: Secondary | ICD-10-CM | POA: Diagnosis not present

## 2015-08-03 ENCOUNTER — Telehealth: Payer: Self-pay | Admitting: Family Medicine

## 2015-08-03 ENCOUNTER — Ambulatory Visit: Payer: Medicaid Other | Admitting: Neurology

## 2015-08-03 NOTE — Telephone Encounter (Signed)
Lmovm to rtn my call. 

## 2015-08-03 NOTE — Procedures (Signed)
ELECTROENCEPHALOGRAM REPORT  Date of Study: 07/28/2015  Patient's Name: Adalberto ColeCarl Morales MRN: [id number] Date of Birth: [birth year]  Referring Provider: Dr. Patrcia DollyKaren Boss Danielsen  Clinical History: This is a 50 year old man with a history of right ACA stroke in April 2016 due to paroxysmal atrial fibrillation, with new onset seizure last 06/24/15.  Medications: Keppra, Norvasc, Lipitor, Eliquis, Coreg, Voltaren, Hydrodiuril, Prinivil,Zestril, K-Dur/Klor-Con, Desyrel  Technical Summary: A multichannel digital EEG recording measured by the international 10-20 system with electrodes applied with paste and impedances below 5000 ohms performed in our laboratory with EKG monitoring in an awake and drowsy patient.  Hyperventilation and photic stimulation were performed.  The digital EEG was referentially recorded, reformatted, and digitally filtered in a variety of bipolar and referential montages for optimal display.    Description: The patient is awake and drowsy during the recording.  During maximal wakefulness, there is a symmetric, medium voltage 8.-9 Hz posterior dominant rhythm that attenuates with eye opening.  The record is symmetric.  During drowsiness, there is an increase in theta slowing of the background. Deeper stages of sleep were not seen.  Hyperventilation and photic stimulation did not elicit any abnormalities.  There were no epileptiform discharges or electrographic seizures seen.    EKG lead showed irregular rhythm.  Impression: This awake and drowsy EEG is normal.    Clinical Correlation: A normal EEG does not exclude a clinical diagnosis of epilepsy.  If further clinical questions remain, prolonged EEG may be helpful.  Clinical correlation is advised.   Patrcia DollyKaren Anagha Loseke, M.D.

## 2015-08-03 NOTE — Telephone Encounter (Signed)
-----   Message from Van ClinesKaren M Aquino, MD sent at 08/03/2015  2:05 PM EST ----- Pls let him/wife know that EEG is normal. Continue Keppra as prescribed. Thanks

## 2015-08-04 ENCOUNTER — Ambulatory Visit (INDEPENDENT_AMBULATORY_CARE_PROVIDER_SITE_OTHER): Payer: Self-pay | Admitting: Podiatry

## 2015-08-04 ENCOUNTER — Encounter: Payer: Self-pay | Admitting: Podiatry

## 2015-08-04 VITALS — BP 159/105 | HR 63 | Resp 14

## 2015-08-04 DIAGNOSIS — M79676 Pain in unspecified toe(s): Secondary | ICD-10-CM

## 2015-08-04 DIAGNOSIS — B353 Tinea pedis: Secondary | ICD-10-CM

## 2015-08-04 DIAGNOSIS — M79674 Pain in right toe(s): Secondary | ICD-10-CM

## 2015-08-04 DIAGNOSIS — B351 Tinea unguium: Secondary | ICD-10-CM

## 2015-08-04 MED ORDER — CLOTRIMAZOLE-BETAMETHASONE 1-0.05 % EX CREA: 1.0000 "application " | TOPICAL_CREAM | Freq: Every day | CUTANEOUS | Status: DC

## 2015-08-04 MED FILL — CLOTRIMAZOLE/BETAMETH CREAM: 1-0.05 | 14 days supply | Qty: 30 | Fill #0

## 2015-08-04 MED FILL — traZODone HCL 50 MG TABS: 50 | 30 days supply | Qty: 30 | Fill #3

## 2015-08-04 NOTE — Addendum Note (Signed)
Addended by: Harlon FlorSOUTHERLAND, Dane Kopke L on: 08/04/2015 02:06 PM   Modules accepted: Medications

## 2015-08-04 NOTE — Progress Notes (Signed)
   Subjective:    Patient ID: [name] ColeCarl Fowers, male    DOB: [birth year], 50 y.o.   MRN: [id number]  HPI This patient presents here for B/L debridement of the toenails and itchy feet since his hospital stay from a stroke in April 2016.   Review of Systems  All other systems reviewed and are negative.      Objective:   Physical Exam        Assessment & Plan:

## 2015-08-05 NOTE — Telephone Encounter (Signed)
Lmovm to rtn my call. 

## 2015-08-05 NOTE — Telephone Encounter (Signed)
Patient's girlfriend/[name] returned my call. I notified her of patient's results & advisement.

## 2015-08-09 MED FILL — ELIQUIS 5 MG TABLET: 5 | 30 days supply | Qty: 60 | Fill #5

## 2015-08-09 MED FILL — levETIRAcetam 500 MG TABS: 500 | 30 days supply | Qty: 60 | Fill #1

## 2015-08-09 MED FILL — POTASSIUM CL ER 20 MEQ TAB: 20 | 30 days supply | Qty: 60 | Fill #1

## 2015-08-13 MED FILL — AMLODIPINE BESYLATE 10 MG T: 10 | 30 days supply | Qty: 30 | Fill #1

## 2015-08-13 MED FILL — CARVEDILOL 25 MG TABLET: 25 | 30 days supply | Qty: 60 | Fill #5

## 2015-08-13 MED FILL — ATORVASTATIN 40 MG TABLET: 40 | 30 days supply | Qty: 30 | Fill #1

## 2015-08-13 MED FILL — LISINOPRIL 40 MG TABLET: 40 | 30 days supply | Qty: 30 | Fill #1

## 2015-08-13 MED FILL — HYDROCHLOROTHIAZIDE 12.5 MG: 12.5 | 30 days supply | Qty: 30 | Fill #1

## 2015-08-19 ENCOUNTER — Telehealth: Payer: Self-pay | Admitting: *Deleted

## 2015-08-19 MED ORDER — CLOTRIMAZOLE-BETAMETHASONE 1-0.05 % EX CREA: 1.0000 "application " | TOPICAL_CREAM | Freq: Every day | CUTANEOUS | Status: DC

## 2015-08-19 MED FILL — CLOTRIMAZOLE/BETAMETH CREAM: 1-0.05 | 15 days supply | Qty: 30 | Fill #0

## 2015-08-19 NOTE — Telephone Encounter (Signed)
Pt's caregiver asked for refill of the foot cream given to the pt by Dr. Stacie Acres.  Dr. Lanney Gins refill as previous +3refills.

## 2015-08-31 MED FILL — traZODone HCL 50 MG TABS: 50 | 30 days supply | Qty: 30 | Fill #4

## 2015-09-06 MED FILL — levETIRAcetam 500 MG TABS: 500 | 30 days supply | Qty: 60 | Fill #2

## 2015-09-06 MED FILL — HYDROCHLOROTHIAZIDE 12.5 MG: 12.5 | 30 days supply | Qty: 30 | Fill #2

## 2015-09-06 MED FILL — POTASSIUM CL ER 20 MEQ TAB: 20 | 30 days supply | Qty: 60 | Fill #2

## 2015-09-06 MED FILL — LISINOPRIL 40 MG TABLET: 40 | 30 days supply | Qty: 30 | Fill #2

## 2015-09-06 MED FILL — AMLODIPINE BESYLATE 10 MG T: 10 | 30 days supply | Qty: 30 | Fill #2

## 2015-09-06 MED FILL — ELIQUIS 5 MG TABLET: 5 | 30 days supply | Qty: 60 | Fill #6

## 2015-09-06 MED FILL — CLOTRIMAZOLE/BETAMETH CREAM: 1-0.05 | 15 days supply | Qty: 30 | Fill #1

## 2015-09-22 MED FILL — CLOTRIMAZOLE/BETAMETH CREAM: 1-0.05 | 15 days supply | Qty: 30 | Fill #2

## 2015-09-22 MED FILL — CARVEDILOL 25 MG TABLET: 25 | 30 days supply | Qty: 60 | Fill #6

## 2015-09-22 MED FILL — ATORVASTATIN 40 MG TABLET: 40 | 30 days supply | Qty: 30 | Fill #2

## 2015-09-29 MED FILL — traZODone HCL 50 MG TABS: 50 | 30 days supply | Qty: 30 | Fill #5

## 2015-09-30 ENCOUNTER — Ambulatory Visit: Payer: Medicaid Other | Attending: Family Medicine | Admitting: Family Medicine

## 2015-09-30 ENCOUNTER — Encounter: Payer: Self-pay | Admitting: Family Medicine

## 2015-09-30 VITALS — BP 144/81 | HR 66 | Temp 98.8°F | Wt 178.4 lb

## 2015-09-30 DIAGNOSIS — L304 Erythema intertrigo: Secondary | ICD-10-CM

## 2015-09-30 DIAGNOSIS — E785 Hyperlipidemia, unspecified: Secondary | ICD-10-CM

## 2015-09-30 DIAGNOSIS — Z8673 Personal history of transient ischemic attack (TIA), and cerebral infarction without residual deficits: Secondary | ICD-10-CM

## 2015-09-30 DIAGNOSIS — I10 Essential (primary) hypertension: Secondary | ICD-10-CM | POA: Diagnosis not present

## 2015-09-30 DIAGNOSIS — K921 Melena: Secondary | ICD-10-CM | POA: Diagnosis not present

## 2015-09-30 DIAGNOSIS — G4700 Insomnia, unspecified: Secondary | ICD-10-CM

## 2015-09-30 DIAGNOSIS — B351 Tinea unguium: Secondary | ICD-10-CM

## 2015-09-30 DIAGNOSIS — I63521 Cerebral infarction due to unspecified occlusion or stenosis of right anterior cerebral artery: Secondary | ICD-10-CM

## 2015-09-30 DIAGNOSIS — Z114 Encounter for screening for human immunodeficiency virus [HIV]: Secondary | ICD-10-CM | POA: Diagnosis not present

## 2015-09-30 MED ORDER — ATORVASTATIN CALCIUM 40 MG PO TABS: 40.0000 mg | ORAL_TABLET | Freq: Every day | ORAL | Status: DC

## 2015-09-30 MED ORDER — AMLODIPINE BESYLATE 10 MG PO TABS: 10.0000 mg | ORAL_TABLET | Freq: Every day | ORAL | Status: DC

## 2015-09-30 MED ORDER — KETOCONAZOLE 2 % EX CREA: 1.0000 "application " | TOPICAL_CREAM | Freq: Every day | CUTANEOUS | Status: DC

## 2015-09-30 MED ORDER — HYDROCHLOROTHIAZIDE 12.5 MG PO TABS: 12.5000 mg | ORAL_TABLET | Freq: Every day | ORAL | Status: DC

## 2015-09-30 MED ORDER — APIXABAN 5 MG PO TABS: 5.0000 mg | ORAL_TABLET | Freq: Two times a day (BID) | ORAL | Status: DC

## 2015-09-30 MED ORDER — LISINOPRIL 40 MG PO TABS: 40.0000 mg | ORAL_TABLET | Freq: Every day | ORAL | Status: DC

## 2015-09-30 MED FILL — ELIQUIS 5 MG TABLET: 5 | 30 days supply | Qty: 60 | Fill #0

## 2015-09-30 MED FILL — HYDROCHLOROTHIAZIDE 12.5 MG: 12.5 | 30 days supply | Qty: 30 | Fill #0

## 2015-09-30 MED FILL — KETOCONAZOLE 2% CREAM: 2 | 15 days supply | Qty: 15 | Fill #0

## 2015-09-30 MED FILL — AMLODIPINE BESYLATE 10 MG T: 10 | 30 days supply | Qty: 30 | Fill #0

## 2015-09-30 MED FILL — LISINOPRIL 40 MG TABLET: 40 | 30 days supply | Qty: 30 | Fill #0

## 2015-09-30 NOTE — Assessment & Plan Note (Signed)
Add nizoral to gluteal area

## 2015-09-30 NOTE — Assessment & Plan Note (Signed)
A:BP well controlled P: Continued current regimen

## 2015-09-30 NOTE — Patient Instructions (Signed)
Mr. [name] Short,  I have refilled you meds Added nizoral cream for gluteal rash  I recommend aquaphor for dry skin  F.u in 3 months for HTN   Dr. Armen PickupFunches

## 2015-09-30 NOTE — Progress Notes (Signed)
Subjective:  Patient ID: [name] Short, male    DOB: 24-Jan-1966  Age: 50 y.o. MRN: [id number]  CC: Hypertension; Allergies; and Pruritis   HPI [name] Short presents for    1. CHRONIC HYPERTENSION  Disease Monitoring  Blood pressure range: not  Checking   Chest pain: no   Dyspnea: no   Claudication: no   Medication compliance: yes  Medication Side Effects  Lightheadedness: no   Urinary frequency: no   Edema: no    2. ? Allergies: has dry skin and back of legs and gluteal area that is pruritic. Rash x one year. No fever or chills. No skin peeling.   Social History  Substance Use Topics  . Smoking status: Current Every Day Smoker    Types: Cigars  . Smokeless tobacco: Never Used     Comment: previously smoked cigarettes - quit ~ 2013.  Marland Kitchen Alcohol Use: No     Comment: Alcohol use stopped in April 2016.   Outpatient Prescriptions Prior to Visit  Medication Sig Dispense Refill  . amLODipine (NORVASC) 10 MG tablet Take 1 tablet (10 mg total) by mouth daily. 30 tablet 5  . apixaban (ELIQUIS) 5 MG TABS tablet Take 1 tablet (5 mg total) by mouth 2 (two) times daily. 180 tablet 3  . atorvastatin (LIPITOR) 40 MG tablet Take 1 tablet (40 mg total) by mouth daily at 6 PM. 30 tablet 5  . carvedilol (COREG) 25 MG tablet Take 1 tablet (25 mg total) by mouth 2 (two) times daily with a meal. 60 tablet 11  . clotrimazole-betamethasone (LOTRISONE) cream Apply 1 application topically daily at 2 PM. 30 g 3  . hydrochlorothiazide (HYDRODIURIL) 12.5 MG tablet Take 1 tablet (12.5 mg total) by mouth daily. 30 tablet 2  . lisinopril (PRINIVIL,ZESTRIL) 40 MG tablet Take 1 tablet (40 mg total) by mouth daily. 30 tablet 5  . potassium chloride SA (K-DUR,KLOR-CON) 20 MEQ tablet Take 2 tablets (40 mEq total) by mouth daily. 180 tablet 3  . traZODone (DESYREL) 50 MG tablet Take 1 tablet (50 mg total) by mouth at bedtime as needed for sleep. 30 tablet 11  . levETIRAcetam (KEPPRA) 500 MG tablet Take 1  tablet (500 mg total) by mouth 2 (two) times daily. 60 tablet 11  . ketoconazole (NIZORAL) 2 % cream Apply 1 application topically daily. (Patient not taking: Reported on 09/30/2015) 15 g 0   No facility-administered medications prior to visit.    ROS Review of Systems  Constitutional: Negative for fever, chills, fatigue and unexpected weight change.  Eyes: Negative for visual disturbance.  Respiratory: Negative for cough and shortness of breath.   Cardiovascular: Negative for chest pain, palpitations and leg swelling.  Gastrointestinal: Negative for nausea, vomiting, abdominal pain, diarrhea, constipation and blood in stool.  Endocrine: Negative for polydipsia, polyphagia and polyuria.  Musculoskeletal: Negative for myalgias, back pain, arthralgias, gait problem and neck pain.  Skin: Positive for rash.  Allergic/Immunologic: Negative for immunocompromised state.  Hematological: Negative for adenopathy. Does not bruise/bleed easily.  Psychiatric/Behavioral: Negative for suicidal ideas, sleep disturbance and dysphoric mood. The patient is not nervous/anxious.     Objective:  BP 144/81 mmHg  Pulse 66  Temp(Src) 98.8 F (37.1 C) (Oral)  Wt 178 lb 6.4 oz (80.922 kg)  BP/Weight 09/30/2015 08/04/2015 07/15/2015  Systolic BP 144 159 135  Diastolic BP 81 105 75  Wt. (Lbs) 178.4 - 186  BMI 25.6 - 26.69   Physical Exam  Constitutional: He appears well-developed and well-nourished.  No distress.  HENT:  Head: Normocephalic and atraumatic.  edentulous   Neck: Normal range of motion. Neck supple.  Pulmonary/Chest: Effort normal.  Musculoskeletal: He exhibits no edema.  Neurological: He is alert.  Skin: Skin is warm and dry. No rash noted. No erythema.     Psychiatric: He has a normal mood and affect.    Of note + FOBT erroneously entered of this patient. IT notified and will remove entry.   Assessment & Plan:   [name] Short was seen today for hypertension, allergies and  pruritis.  Diagnoses and all orders for this visit:  Hyperlipidemia LDL goal <70  Screening for HIV (human immunodeficiency virus)  Blood in stool -     IFOBT POC (occult bld, rslt in office)  Essential hypertension -     amLODipine (NORVASC) 10 MG tablet; Take 1 tablet (10 mg total) by mouth daily. -     lisinopril (PRINIVIL,ZESTRIL) 40 MG tablet; Take 1 tablet (40 mg total) by mouth daily.  History of stroke -     apixaban (ELIQUIS) 5 MG TABS tablet; Take 1 tablet (5 mg total) by mouth 2 (two) times daily.  Acute right anterior cerebral artery (ACA) ischemic stroke -     atorvastatin (LIPITOR) 40 MG tablet; Take 1 tablet (40 mg total) by mouth daily at 6 PM.  Onychomycosis of toenail -     hydrochlorothiazide (HYDRODIURIL) 12.5 MG tablet; Take 1 tablet (12.5 mg total) by mouth daily.  Insomnia  Intertrigo -     ketoconazole (NIZORAL) 2 % cream; Apply 1 application topically daily.  Other orders -     Cancel: Lipid Panel; Future -     Cancel: HIV antibody (with reflex); Future   No orders of the defined types were placed in this encounter.    Follow-up: No Follow-up on file.   Dessa PhiJosalyn Chalee Hirota MD

## 2015-10-05 MED FILL — POTASSIUM CL ER 20 MEQ TAB: 20 | 30 days supply | Qty: 60 | Fill #3

## 2015-10-05 MED FILL — levETIRAcetam 500 MG TABS: 500 | 30 days supply | Qty: 60 | Fill #3

## 2015-10-13 ENCOUNTER — Ambulatory Visit (INDEPENDENT_AMBULATORY_CARE_PROVIDER_SITE_OTHER): Payer: Medicaid Other | Admitting: Neurology

## 2015-10-13 ENCOUNTER — Encounter: Payer: Self-pay | Admitting: Neurology

## 2015-10-13 ENCOUNTER — Ambulatory Visit (INDEPENDENT_AMBULATORY_CARE_PROVIDER_SITE_OTHER): Payer: Medicaid Other | Admitting: *Deleted

## 2015-10-13 VITALS — BP 110/80 | HR 51 | Ht 70.0 in | Wt 179.0 lb

## 2015-10-13 DIAGNOSIS — I671 Cerebral aneurysm, nonruptured: Secondary | ICD-10-CM

## 2015-10-13 DIAGNOSIS — I4891 Unspecified atrial fibrillation: Secondary | ICD-10-CM

## 2015-10-13 DIAGNOSIS — Z8673 Personal history of transient ischemic attack (TIA), and cerebral infarction without residual deficits: Secondary | ICD-10-CM

## 2015-10-13 DIAGNOSIS — Z5181 Encounter for therapeutic drug level monitoring: Secondary | ICD-10-CM | POA: Diagnosis not present

## 2015-10-13 DIAGNOSIS — G40209 Localization-related (focal) (partial) symptomatic epilepsy and epileptic syndromes with complex partial seizures, not intractable, without status epilepticus: Secondary | ICD-10-CM | POA: Diagnosis not present

## 2015-10-13 LAB — CBC
HEMATOCRIT: 36.5 % — AB (ref 39.0–52.0)
HEMOGLOBIN: 12.5 g/dL — AB (ref 13.0–17.0)
MCH: 32.7 pg (ref 26.0–34.0)
MCHC: 34.2 g/dL (ref 30.0–36.0)
MCV: 95.5 fL (ref 78.0–100.0)
MPV: 10.9 fL (ref 8.6–12.4)
Platelets: 112 10*3/uL — ABNORMAL LOW (ref 150–400)
RBC: 3.82 MIL/uL — ABNORMAL LOW (ref 4.22–5.81)
RDW: 13.6 % (ref 11.5–15.5)
WBC: 2.9 10*3/uL — AB (ref 4.0–10.5)

## 2015-10-13 LAB — BASIC METABOLIC PANEL
BUN: 14 mg/dL (ref 7–25)
CO2: 27 mmol/L (ref 20–31)
CREATININE: 1.11 mg/dL (ref 0.60–1.35)
Calcium: 9.1 mg/dL (ref 8.6–10.3)
Chloride: 106 mmol/L (ref 98–110)
GLUCOSE: 91 mg/dL (ref 65–99)
POTASSIUM: 3.7 mmol/L (ref 3.5–5.3)
Sodium: 142 mmol/L (ref 135–146)

## 2015-10-13 MED ORDER — LEVETIRACETAM 500 MG PO TABS: 500.0000 mg | ORAL_TABLET | Freq: Two times a day (BID) | ORAL | Status: DC

## 2015-10-13 NOTE — Progress Notes (Signed)
NEUROLOGY FOLLOW UP OFFICE NOTE  [name] Short [id number]  HISTORY OF PRESENT ILLNESS: I had the pleasure of seeing [name] Short in follow-up in the neurology clinic on 10/13/2015. He is again accompanied by his significant other who helps supplement the history today. The patient was last seen 3 months ago for new onset seizure on 06/24/15, likely secondary to old stroke. He was started on Keppra  BID.  Records and images were personally reviewed where available. Routine wake and drowsy EEG normal. He denies any seizures or seizure-like symptoms since December 2016, no further leg shaking episodes. He denies any significant side effects on Keppra, there is some irritability per wife. He was reporting headaches on his last visit, no further headaches per patient. He continues to have sleep difficulties, with around 5-6 hours of sleep at night. He denies any dizziness, diplopia, worsening focal numbness/tingling/weakness. He has baseline dysarthria and left foot drop from the stroke. He has an AFO but does not wear it because it cause itching.   HPI 07/14/15: This is a 50 yo RH man with a history of hypertension, paroxysmal atrial fibrillation on Eliquis, right ACA stroke last April 2016 with residual mild left leg weakness,with new onset seizure last 06/24/15. He was at home and started having uncontrollable left leg shaking. He called to his girlfriend, who found him unresponsive and having a generalized tonic-clonic seizure lasting 3 minutes. He reports biting his tongue, no incontinence. He was on the couch and did not fall. He denied worsening left-sided weakness after the seizure. He was brought to [facility] ER where he was back to baseline. Bloodwork showed normal CBC, BMP. No urine drug screen done. I personally reviewed head CT without contrast which showed old infarct and encephalomalacia in the right ACA distribution. There was concern for high attenuation of the entire A1 segment of the right MCA.  A follow-up CTA head did not show any large vessel occlusion. There was a chronically occluded right A2 segment, and mild to moderate stenosis of the right PCA, mild luminal irregularity in the right MCA. 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 He was given IV Keppra and discharged home with 32 tablets of Keppra, which he ran out of last Saturday. He denies any side effects on the Keppra, no further seizures. He and his girlfriend denies any staring/unresponsive episodes, no gaps in time, olfactory/gustatory hallucinations, deja vu, rising epigastric sensation, focal numbness/tingling/weakness, myoclonic jerks.  He has occasional "regular" headaches occurring every 3-4 days for the past few month, no associated nausea, vomiting, photo/phonophobia, with good response to Ibuprofen. He has not been getting good sleep, with only 4-5 hours of sleep. He denies any worsened sleep deprivation prior to the seizure, no alcohol intake. He denies any dizziness, diplopia, dysarthria, dysphagia, neck/back pain, bowel/bladder dysfunction. He has some itching behind both legs. Records from April 2016 were reviewed, at that time he had sudden onset left leg weakness and altered mental status, with BP of 220/110. He received IV-TPA. I personally reviewed MRI brain at that time which showed acute right ACA infarct, MRA showed aplastic A1 segment of the left ACA with A2 segment of the ACA supplied from the right bilaterally, irregularity of the A1 segment of the ACA bilaterally with abrupt cut off of flor of the A2 segment of right ACA. Carotid dopplers unremarkable, echo showed EF of 35-40% with diffuse hypokinesis, venous doppler negative for DVT, TEE showed EF of 35% without thrombus. Hypercoagulable panel negative. He  was found to be in paroxysmal atrial fibrillation and has been taking Eliquis since then. He has had improvement in left leg weakness (initially flaccid per girlfriend), he is able to walk but  with distal left leg weakness, given an AFO which he has not been using.   Epilepsy Risk Factors: Old right ACA stroke. Otherwise he had a normal birth and early development. There is no history of febrile convulsions, CNS infections such as meningitis/encephalitis, significant traumatic brain injury, neurosurgical procedures, or family history of seizures.  PAST MEDICAL HISTORY: Past Medical History  Diagnosis Date  . Hypertension     a. Previously on meds but none in several years.  . Smoker     a. Previous tobacco - quit ~ 2013. Ongoing daily marijuana usage. Occasional cigar.  . Stroke [facility])     a. 10/2014 MRI Head: acute mod size nonhemorrhagic R ant cerebral artery distribution infarct involving the medial aspect of the right frontal and parietal lobe and right aspect of the corpus callosum w/ local mass effect-->TPA;  b. 10/2014 MRA: Abrupt cut off of the R ACA A2 segment;  b. 10/2014 Carotid U/S: 1-39% bilat ICA stenosis.  . Cardiomyopathy (HCC)     a. 10/2014 Echo: EF 35-40%, diff HK, sev LVH, mod dil LA, PASP .;  b.  Echo 7/16: Severe LVH, EF 55-60%, aortic sclerosis without stenosis, mild AI, trivial MR, severe BAE    MEDICATIONS: Current Outpatient Prescriptions on File Prior to Visit  Medication Sig Dispense Refill  . amLODipine (NORVASC) 10 MG tablet Take 1 tablet (10 mg total) by mouth daily. 30 tablet 5  . apixaban (ELIQUIS) 5 MG TABS tablet Take 1 tablet (5 mg total) by mouth 2 (two) times daily. 180 tablet 3  . atorvastatin (LIPITOR) 40 MG tablet Take 1 tablet (40 mg total) by mouth daily at 6 PM. 30 tablet 5  . carvedilol (COREG) 25 MG tablet Take 1 tablet (25 mg total) by mouth 2 (two) times daily with a meal. 60 tablet 11  . clotrimazole-betamethasone (LOTRISONE) cream Apply 1 application topically daily at 2 PM. 30 g 3  . hydrochlorothiazide (HYDRODIURIL) 12.5 MG tablet Take 1 tablet (12.5 mg total) by mouth daily. 30 tablet 2  . ketoconazole (NIZORAL) 2 % cream  Apply 1 application topically daily. 15 g 0  . levETIRAcetam (KEPPRA) 500 MG tablet Take 1 tablet (500 mg total) by mouth 2 (two) times daily. 60 tablet 11  . lisinopril (PRINIVIL,ZESTRIL) 40 MG tablet Take 1 tablet (40 mg total) by mouth daily. 30 tablet 5  . potassium chloride SA (K-DUR,KLOR-CON) 20 MEQ tablet Take 2 tablets (40 mEq total) by mouth daily. 180 tablet 3  . traZODone (DESYREL) 50 MG tablet Take 1 tablet (50 mg total) by mouth at bedtime as needed for sleep. 30 tablet 11   No current facility-administered medications on file prior to visit.    ALLERGIES: No Known Allergies  FAMILY HISTORY: Family History  Problem Relation Age of Onset  . Other      no premature cad  . Diabetes Mother   . Heart disease Father   . Heart attack Father   . Stroke Sister   . Hypertension Father   . Hypertension Brother   . Hypertension Sister     SOCIAL HISTORY: Social History   Social History  . Marital Status: Single    Spouse Name: N/A  . Number of Children: 3  . Years of Education: 12   Occupational History  .  Unemployed    Social History Main Topics  . Smoking status: Current Every Day Smoker    Types: Cigars  . Smokeless tobacco: Never Used     Comment: previously smoked cigarettes - quit ~ 2013.  Marland Kitchen. Alcohol Use: No     Comment: Alcohol use stopped in April 2016.  . Drug Use: Yes     Comment: Marijuana daily. Has not had marijuana since being in hospital.  . Sexual Activity: Not on file   Other Topics Concern  . Not on file   Social History Narrative   [place] in [place] with his GF.  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   Right-handed.   Occasional caffeine use.    REVIEW OF SYSTEMS: Constitutional: No fevers, chills, or sweats, no generalized fatigue, change in appetite Eyes: No visual changes, double vision, eye pain Ear, nose and throat: No hearing loss, ear pain, nasal congestion, sore throat Cardiovascular: No chest pain, palpitations Respiratory:   No shortness of breath at rest or with exertion, wheezes GastrointestinaI: No nausea, vomiting, diarrhea, abdominal pain, fecal incontinence Genitourinary:  No dysuria, urinary retention or frequency Musculoskeletal:  No neck pain, back pain Integumentary: No rash, pruritus, skin lesions Neurological: as above Psychiatric: No depression, insomnia, anxiety Endocrine: No palpitations, fatigue, diaphoresis, mood swings, change in appetite, change in weight, increased thirst Hematologic/Lymphatic:  No anemia, purpura, petechiae. Allergic/Immunologic: no itchy/runny eyes, nasal congestion, recent allergic reactions, rashes  PHYSICAL EXAM: Filed Vitals:   10/13/15 1134  BP: 110/80  Pulse: 51   General: No acute distress Head:  Normocephalic/atraumatic Neck: supple, no paraspinal tenderness, full range of motion Heart:  Regular rate and rhythm Lungs:  Clear to auscultation bilaterally Back: No paraspinal tenderness Skin/Extremities: No rash, no edema Neurological Exam: alert and oriented to person, place, and time. No aphasia , mild dysarthria. Fund of knowledge is appropriate.  Recent and remote memory are intact. 2/3 delayed recall. Attention and concentration are normal.    Able to name objects and repeat phrases. Cranial nerves: Pupils equal, round, reactive to light.  Fundoscopic exam unremarkable, no papilledema. Extraocular movements intact with no nystagmus. Visual fields full. Facial sensation intact. No facial asymmetry. Tongue, uvula, palate midline.  Motor: Bulk and tone normal, muscle strength 5/5 on right UE, LE, left UE and proximal LE, 4/5 left knee flexion and foot extension (similar to prior), no pronator drift.  Sensation to light touch intact.  No extinction to double simultaneous stimulation.  Deep tendon reflexes 2+ throughout, toes downgoing.  Finger to nose testing intact.  Steppage gait due to left foot drop, unable to tandem walk. Romberg negative.  IMPRESSION: This is a  50 yo RH man with a history of right ACA stroke in April 2016 due to paroxysmal atrial fibrillation on Eliquis, with new onset seizure last 06/24/15 suggestive of focal to bilateral tonic-clonic seizure that from old stroke. He denies any further seizures since December 2016, continue Keppra 500mg  BID. He is aware of [place] driving laws to stop driving after a seizure, until 6 months seizure-free. He is not driving. He has a small 4mm aneurysm incidentally seen on CTA done 06/24/15, a follow-up CTA will be ordered in 1 year. He has a left foot drop but does not use AFO due to itching, advised to speak to medical equipment store for advice on options. He will discuss sleep issues with his PCP. They know to go to ER if symptoms change or worsen. He will follow-up in 6 months.  Thank you for allowing me to participate in his care.  Please do not hesitate to call for any questions or concerns.  The duration of this appointment visit was 15 minutes of face-to-face time with the patient.  Greater than 50% of this time was spent in counseling, explanation of diagnosis, planning of further management, and coordination of care.   Patrcia Dolly, M.D.   CC: Dr. Armen Pickup

## 2015-10-13 NOTE — Progress Notes (Signed)
Pt was started on Eliquis 5mg  q 12 hours for Atrial Fib on May 25,2016 .   Reviewed patients medication list. Pt is not currently on any combined P-gp and strong CYP3A4 inhibitors/inducers (ketoconazole, traconazole, ritonavir, carbamazepine, phenytoin, rifampin, St. John's wort). Reviewed labs. SCr 1.11 (10/13/15), Weight 81.19 kg,  Age [age]yrs old.  Dose is appropriate based on weight, age and SrCr. Hgb 12.5 (10/13/15 0 and HCT 36.5 (10/13/15). Pt states has not missed any doses and has had not sign or symptom of bleeding or any sign or symptom of stroke   A full discussion of the nature of anticoagulants has been carried out. A benefit/risk analysis has been presented to the patient, so that they understand the justification for choosing anticoagulation with Eliquis at this time. The need for compliance is stressed. Pt is aware to take the medication twice daily. Side effects of potential bleeding are discussed, including unusual colored urine or stools, coughing up blood or coffee ground emesis, nose bleeds or serious fall or head trauma. Discussed signs and symptoms of stroke. The patient should avoid any OTC items containing aspirin or ibuprofen. Avoid alcohol consumption. Call if any signs of abnormal bleeding. Discussed financial obligations and pt states is not having any difficulty in obtaining medication. Caregiver aware to continue same dosage and follow up with Cardiologist as scheduled.

## 2015-10-13 NOTE — Patient Instructions (Signed)
1. Continue Keppra 500mg  twice a day 2. Discuss sleep issues with your family doctor 3. Talk to medical supply store about foot brace irritation 4. Follow-up in 6 months, call for any changes  Seizure Precautions: 1. If medication has been prescribed for you to prevent seizures, take it exactly as directed.  Do not stop taking the medicine without talking to your doctor first, even if you have not had a seizure in a long time.   2. Avoid activities in which a seizure would cause danger to yourself or to others.  Don't operate dangerous machinery, swim alone, or climb in high or dangerous places, such as on ladders, roofs, or girders.  Do not drive unless your doctor says you may.  3. If you have any warning that you may have a seizure, lay down in a safe place where you can't hurt yourself.    4.  No driving for 6 months from last seizure, as per [place].   Please refer to the following link on the Epilepsy Foundation of America's website for more information: http://www.epilepsyfoundation.org/answerplace/Social/driving/drivingu.cfm   5.  Maintain good sleep hygiene. Avoid alcohol.  6.  Contact your doctor if you have any problems that may be related to the medicine you are taking.  7.  Call 911 and bring the patient back to the ED if:        A.  The seizure lasts longer than 5 minutes.       B.  The patient doesn't awaken shortly after the seizure  C.  The patient has new problems such as difficulty seeing, speaking or moving  D.  The patient was injured during the seizure  E.  The patient has a temperature over 102 F (39C)  F.  The patient vomited and now is having trouble breathing

## 2015-10-19 ENCOUNTER — Emergency Department (HOSPITAL_COMMUNITY): Payer: Medicaid Other

## 2015-10-19 ENCOUNTER — Observation Stay (HOSPITAL_COMMUNITY): Payer: Medicaid Other

## 2015-10-19 ENCOUNTER — Observation Stay (HOSPITAL_COMMUNITY)
Admission: EM | Admit: 2015-10-19 | Discharge: 2015-10-20 | Disposition: A | Discharge status: Home / Self Care | Payer: Medicaid Other | Attending: Internal Medicine | Admitting: Internal Medicine

## 2015-10-19 ENCOUNTER — Encounter (HOSPITAL_COMMUNITY): Payer: Self-pay | Admitting: Emergency Medicine

## 2015-10-19 DIAGNOSIS — Z79899 Other long term (current) drug therapy: Secondary | ICD-10-CM | POA: Diagnosis not present

## 2015-10-19 DIAGNOSIS — R569 Unspecified convulsions: Secondary | ICD-10-CM | POA: Diagnosis not present

## 2015-10-19 DIAGNOSIS — I480 Paroxysmal atrial fibrillation: Secondary | ICD-10-CM

## 2015-10-19 DIAGNOSIS — R0902 Hypoxemia: Secondary | ICD-10-CM

## 2015-10-19 DIAGNOSIS — E785 Hyperlipidemia, unspecified: Secondary | ICD-10-CM | POA: Insufficient documentation

## 2015-10-19 DIAGNOSIS — I509 Heart failure, unspecified: Secondary | ICD-10-CM | POA: Insufficient documentation

## 2015-10-19 DIAGNOSIS — I4891 Unspecified atrial fibrillation: Secondary | ICD-10-CM | POA: Insufficient documentation

## 2015-10-19 DIAGNOSIS — F17290 Nicotine dependence, other tobacco product, uncomplicated: Secondary | ICD-10-CM | POA: Diagnosis not present

## 2015-10-19 DIAGNOSIS — Z7901 Long term (current) use of anticoagulants: Secondary | ICD-10-CM | POA: Diagnosis not present

## 2015-10-19 DIAGNOSIS — I429 Cardiomyopathy, unspecified: Secondary | ICD-10-CM

## 2015-10-19 DIAGNOSIS — I110 Hypertensive heart disease with heart failure: Secondary | ICD-10-CM | POA: Diagnosis not present

## 2015-10-19 DIAGNOSIS — G40909 Epilepsy, unspecified, not intractable, without status epilepticus: Secondary | ICD-10-CM | POA: Diagnosis not present

## 2015-10-19 DIAGNOSIS — I10 Essential (primary) hypertension: Secondary | ICD-10-CM | POA: Diagnosis not present

## 2015-10-19 DIAGNOSIS — I69354 Hemiplegia and hemiparesis following cerebral infarction affecting left non-dominant side: Secondary | ICD-10-CM | POA: Diagnosis not present

## 2015-10-19 DIAGNOSIS — Z720 Tobacco use: Secondary | ICD-10-CM | POA: Insufficient documentation

## 2015-10-19 DIAGNOSIS — F17210 Nicotine dependence, cigarettes, uncomplicated: Secondary | ICD-10-CM | POA: Diagnosis present

## 2015-10-19 DIAGNOSIS — G8194 Hemiplegia, unspecified affecting left nondominant side: Secondary | ICD-10-CM | POA: Diagnosis present

## 2015-10-19 HISTORY — DX: Major depressive disorder, single episode, unspecified: F32.9

## 2015-10-19 HISTORY — DX: Respiratory tuberculosis unspecified: A15.9

## 2015-10-19 HISTORY — DX: Depression, unspecified: F32.A

## 2015-10-19 HISTORY — DX: Unspecified convulsions: R56.9

## 2015-10-19 LAB — CBC WITH DIFFERENTIAL/PLATELET
BASOS PCT: 0 %
Basophils Absolute: 0 10*3/uL (ref 0.0–0.1)
EOS PCT: 8 %
Eosinophils Absolute: 0.3 10*3/uL (ref 0.0–0.7)
HCT: 41.2 % (ref 39.0–52.0)
Hemoglobin: 13.1 g/dL (ref 13.0–17.0)
LYMPHS ABS: 0.6 10*3/uL — AB (ref 0.7–4.0)
Lymphocytes Relative: 15 %
MCH: 31.4 pg (ref 26.0–34.0)
MCHC: 31.8 g/dL (ref 30.0–36.0)
MCV: 98.8 fL (ref 78.0–100.0)
MONOS PCT: 7 %
Monocytes Absolute: 0.3 10*3/uL (ref 0.1–1.0)
NEUTROS ABS: 3.1 10*3/uL (ref 1.7–7.7)
Neutrophils Relative %: 70 %
PLATELETS: 110 10*3/uL — AB (ref 150–400)
RBC: 4.17 MIL/uL — AB (ref 4.22–5.81)
RDW: 13 % (ref 11.5–15.5)
WBC: 4.3 10*3/uL (ref 4.0–10.5)

## 2015-10-19 LAB — ETHANOL: Alcohol, Ethyl (B): <5 mg/dL (ref <5)

## 2015-10-19 LAB — URINE MICROSCOPIC-ADD ON

## 2015-10-19 LAB — BASIC METABOLIC PANEL
Anion gap: 8 (ref 5–15)
BUN: 14 mg/dL (ref 6–20)
CHLORIDE: 109 mmol/L (ref 101–111)
CO2: 27 mmol/L (ref 22–32)
Calcium: 9.1 mg/dL (ref 8.9–10.3)
Creatinine, Ser: 1.22 mg/dL (ref 0.61–1.24)
GFR calc Af Amer: >60 mL/min (ref >60)
GFR calc non Af Amer: >60 mL/min (ref >60)
GLUCOSE: 101 mg/dL — AB (ref 65–99)
POTASSIUM: 3.5 mmol/L (ref 3.5–5.1)
Sodium: 144 mmol/L (ref 135–145)

## 2015-10-19 LAB — URINALYSIS, ROUTINE W REFLEX MICROSCOPIC
Bilirubin Urine: NEGATIVE
Glucose, UA: NEGATIVE mg/dL
Ketones, ur: NEGATIVE mg/dL
Leukocytes, UA: NEGATIVE
Nitrite: NEGATIVE
PROTEIN: NEGATIVE mg/dL
Specific Gravity, Urine: 1.01 (ref 1.005–1.030)
pH: 7.5 (ref 5.0–8.0)

## 2015-10-19 LAB — RAPID URINE DRUG SCREEN, HOSP PERFORMED
Amphetamines: NOT DETECTED
BARBITURATES: NOT DETECTED
BENZODIAZEPINES: POSITIVE — AB
Cocaine: NOT DETECTED
Opiates: NOT DETECTED
Tetrahydrocannabinol: POSITIVE — AB

## 2015-10-19 MED ORDER — APIXABAN 5 MG PO TABS
5.0000 mg | ORAL_TABLET | Freq: Two times a day (BID) | ORAL | Status: DC
  Administered 2015-10-19 – 2015-10-20 (×2): 5 mg via ORAL
  Filled 2015-10-19 (×2): qty 1

## 2015-10-19 MED ORDER — SODIUM CHLORIDE 0.9% FLUSH
3.0000 mL | Freq: Two times a day (BID) | INTRAVENOUS | Status: DC
  Administered 2015-10-19 (×2): 3 mL via INTRAVENOUS

## 2015-10-19 MED ORDER — SODIUM CHLORIDE 0.9 % IV BOLUS (SEPSIS)
1000.0000 mL | Freq: Once | INTRAVENOUS | Status: AC
  Administered 2015-10-19: 1000 mL via INTRAVENOUS

## 2015-10-19 MED ORDER — ONDANSETRON HCL 4 MG PO TABS: 4.0000 mg | ORAL_TABLET | Freq: Four times a day (QID) | ORAL | Status: DC | PRN

## 2015-10-19 MED ORDER — ENOXAPARIN SODIUM 40 MG/0.4ML ~~LOC~~ SOLN: 40.0000 mg | SUBCUTANEOUS | Status: DC

## 2015-10-19 MED ORDER — ACETAMINOPHEN 325 MG PO TABS: 650.0000 mg | ORAL_TABLET | Freq: Four times a day (QID) | ORAL | Status: DC | PRN

## 2015-10-19 MED ORDER — ONDANSETRON HCL 4 MG/2ML IJ SOLN: 4.0000 mg | Freq: Four times a day (QID) | INTRAMUSCULAR | Status: DC | PRN

## 2015-10-19 MED ORDER — LEVETIRACETAM 500 MG PO TABS
1000.0000 mg | ORAL_TABLET | Freq: Two times a day (BID) | ORAL | Status: DC
Start: 2015-10-20 — End: 2015-10-20
  Administered 2015-10-20: 1000 mg via ORAL
  Filled 2015-10-19 (×2): qty 2

## 2015-10-19 MED ORDER — CARVEDILOL 25 MG PO TABS
25.0000 mg | ORAL_TABLET | Freq: Two times a day (BID) | ORAL | Status: DC
Start: 2015-10-19 — End: 2015-10-20
  Administered 2015-10-19 – 2015-10-20 (×2): 25 mg via ORAL
  Filled 2015-10-19: qty 2
  Filled 2015-10-19: qty 1

## 2015-10-19 MED ORDER — AMLODIPINE BESYLATE 10 MG PO TABS
10.0000 mg | ORAL_TABLET | Freq: Every day | ORAL | Status: DC
  Administered 2015-10-19 – 2015-10-20 (×2): 10 mg via ORAL
  Filled 2015-10-19: qty 1
  Filled 2015-10-19: qty 2

## 2015-10-19 MED ORDER — MAGNESIUM SULFATE 2 GM/50ML IV SOLN
2.0000 g | Freq: Once | INTRAVENOUS | Status: AC
  Administered 2015-10-19: 2 g via INTRAVENOUS
  Filled 2015-10-19: qty 50

## 2015-10-19 MED ORDER — ATORVASTATIN CALCIUM 40 MG PO TABS
40.0000 mg | ORAL_TABLET | Freq: Every day | ORAL | Status: DC
Start: 2015-10-19 — End: 2015-10-20
  Administered 2015-10-19: 40 mg via ORAL
  Filled 2015-10-19: qty 1

## 2015-10-19 MED ORDER — MORPHINE SULFATE (PF) 2 MG/ML IV SOLN: 1.0000 mg | INTRAVENOUS | Status: DC | PRN

## 2015-10-19 MED ORDER — ACETAMINOPHEN 650 MG RE SUPP: 650.0000 mg | Freq: Four times a day (QID) | RECTAL | Status: DC | PRN

## 2015-10-19 MED ORDER — LORAZEPAM 2 MG/ML IJ SOLN: 1.0000 mg | INTRAMUSCULAR | Status: DC | PRN

## 2015-10-19 MED ORDER — SODIUM CHLORIDE 0.9 % IV SOLN
1500.0000 mg | Freq: Once | INTRAVENOUS | Status: AC
  Administered 2015-10-19: 1500 mg via INTRAVENOUS
  Filled 2015-10-19: qty 15

## 2015-10-19 MED ORDER — NALOXONE HCL 2 MG/2ML IJ SOSY: 2.0000 mg | PREFILLED_SYRINGE | Freq: Once | INTRAMUSCULAR | Status: DC

## 2015-10-19 MED ORDER — LISINOPRIL 40 MG PO TABS
40.0000 mg | ORAL_TABLET | Freq: Every day | ORAL | Status: DC
  Administered 2015-10-19 – 2015-10-20 (×2): 40 mg via ORAL
  Filled 2015-10-19: qty 2
  Filled 2015-10-19: qty 1

## 2015-10-19 MED ORDER — SODIUM CHLORIDE 0.9 % IV SOLN
INTRAVENOUS | Status: DC
  Administered 2015-10-19: 22:00:00 via INTRAVENOUS

## 2015-10-19 MED ORDER — POTASSIUM CHLORIDE CRYS ER 20 MEQ PO TBCR
40.0000 meq | EXTENDED_RELEASE_TABLET | Freq: Every day | ORAL | Status: DC
  Administered 2015-10-19 – 2015-10-20 (×2): 40 meq via ORAL
  Filled 2015-10-19 (×2): qty 2

## 2015-10-19 MED ORDER — NALOXONE HCL 2 MG/2ML IJ SOSY
PREFILLED_SYRINGE | INTRAMUSCULAR | Status: AC
  Filled 2015-10-19: qty 2

## 2015-10-19 MED ORDER — SODIUM CHLORIDE 0.9 % IV SOLN
1600.0000 mg | Freq: Once | INTRAVENOUS | Status: AC
  Administered 2015-10-19: 1600 mg via INTRAVENOUS
  Filled 2015-10-19: qty 32

## 2015-10-19 MED ORDER — HYDROCHLOROTHIAZIDE 25 MG PO TABS
12.5000 mg | ORAL_TABLET | Freq: Every day | ORAL | Status: DC
  Administered 2015-10-19: 12.5 mg via ORAL
  Filled 2015-10-19: qty 1

## 2015-10-19 NOTE — ED Provider Notes (Signed)
Medical screening examination/treatment/procedure(s) were conducted as a shared visit with non-physician practitioner(s) and myself.  I personally evaluated the patient during the encounter.  Here for breakthrough seizures. Versed with EMS. Periodic apnea and initial examination showed GCS of 3, slowly improved to 6 on my evaluation. Planned for intubation as he wasn't protecting airway however, prior to gathering supplies, MS continued to improve to the point where GCS was near 8 but still sleepy however thought to be protecting airway.  After three seizures, continued sleepiness,, concern for possible status so will consult neurology for further recs.   CRITICAL CARE Performed by: Marily MemosMesner, Rocsi Hazelbaker  Total critical care time: 30 minutes Critical care time was exclusive of separately billable procedures and treating other patients. Critical care was necessary to treat or prevent imminent or life-threatening deterioration. Critical care was time spent personally by me on the following activities: development of treatment plan with patient and/or surrogate as well as nursing, discussions with consultants, evaluation of patient's response to treatment, examination of patient, obtaining history from patient or surrogate, ordering and performing treatments and interventions, ordering and review of laboratory studies, ordering and review of radiographic studies, pulse oximetry and re-evaluation of patient's condition.    EKG Interpretation   Date/Time:  Tuesday October 19 2015 12:31:09 EDT Ventricular Rate:  92 PR Interval:    QRS Duration: 115 QT Interval:  436 QTC Calculation: 539 R Axis:   -13 Text Interpretation:  Atrial fibrillation Nonspecific intraventricular  conduction delay Nonspecific T abnormalities, lateral leads Confirmed by  [facility] MD, Kassiah Mccrory [phone]([id number]) on 10/19/2015 1:27:21 PM        Marily MemosJason Jacai Kipp, MD 10/20/15 1025

## 2015-10-19 NOTE — Progress Notes (Signed)
EEG Completed; Results Pending  

## 2015-10-19 NOTE — ED Notes (Addendum)
To ED via [facility] from home with c/o seizure activity witnessed by family/fire dept/EMS -- total of 3 seizures-- received Versed 5mg  IM by EMS. Pt sleeping/post ictal on arrival. Pt is having periods of apnea lasting approx 3-5 seconds-- Will Dansie, PA notified. Resp therapy called to room.

## 2015-10-19 NOTE — Consult Note (Signed)
Requesting Physician: Everlene Farrier, PA      Reason for consultation: To manage seizures  HPI:                                                                                                                                         [name] Short is an 50 y.o. male patient was brought into the ER after his wife found him down at home in a postictal state . He then had 2 more episodes of convulsive seizures each lasting about 3-4 minutes witnessed by his wife. He apparently had at least 3 brief episodes of convulsive seizures en route in the ambulance. He was given Versed 5 mg in the ambulance. His been stable after he arrived in the ER, no further seizures. He was in postictal state and drowsy, is gradually improving mental status. His wife and daughter reports that his mental status is nearly at his baseline, he is able to answer simple questions and follow commands.   He is on Keppra 500 mg twice a day at home, his last seizure was in September 2016. He  reported being compliant with his medications. He does not take his morning dose of Keppra today  No recent infections or fever, denies alcohol or substance abuse.   No other new neurological symptoms. He does have some residual mild baseline left sided weakness.    Past Medical History: Past Medical History  Diagnosis Date  . Hypertension     a. Previously on meds but none in several years.  . Smoker     a. Previous tobacco - quit ~ 2013. Ongoing daily marijuana usage. Occasional cigar.  . Stroke [facility])     a. 10/2014 MRI Head: acute mod size nonhemorrhagic R ant cerebral artery distribution infarct involving the medial aspect of the right frontal and parietal lobe and right aspect of the corpus callosum w/ local mass effect-->TPA;  b. 10/2014 MRA: Abrupt cut off of the R ACA A2 segment;  b. 10/2014 Carotid U/S: 1-39% bilat ICA stenosis.  . Cardiomyopathy (HCC)     a. 10/2014 Echo: EF 35-40%, diff HK, sev LVH, mod dil LA, PASP .;  b.  Echo  7/16: Severe LVH, EF 55-60%, aortic sclerosis without stenosis, mild AI, trivial MR, severe BAE    Past Surgical History  Procedure Laterality Date  . Tee without cardioversion N/A 11/03/2014    Procedure: TRANSESOPHAGEAL ECHOCARDIOGRAM (TEE);  Surgeon: Laurey Morale, MD;  Location: [facility] ENDOSCOPY;  Service: Cardiovascular;  Laterality: N/A;    Family History: Family History  Problem Relation Age of Onset  . Other      no premature cad  . Diabetes Mother   . Heart disease Father   . Heart attack Father   . Stroke Sister   . Hypertension Father   . Hypertension Brother   . Hypertension Sister     Social History:  reports that he has been smoking Cigars.  He has never used smokeless tobacco. He reports that he uses illicit drugs. He reports that he does not drink alcohol.  Allergies:  No Known Allergies   Medications:                                                                                                                         Current facility-administered medications:  .  magnesium sulfate IVPB 2 g 50 mL, 2 g, Intravenous, Once, Marily Memos, MD .  naloxone [facility]) 2 MG/2ML injection, , , ,  .  naloxone (NARCAN) injection 2 mg, 2 mg, Intravenous, Once, Everlene Farrier, PA-C  Current outpatient prescriptions:  .  amLODipine (NORVASC) 10 MG tablet, Take 1 tablet (10 mg total) by mouth daily., Disp: 30 tablet, Rfl: 5 .  apixaban (ELIQUIS) 5 MG TABS tablet, Take 1 tablet (5 mg total) by mouth 2 (two) times daily., Disp: 180 tablet, Rfl: 3 .  atorvastatin (LIPITOR) 40 MG tablet, Take 1 tablet (40 mg total) by mouth daily at 6 PM., Disp: 30 tablet, Rfl: 5 .  carvedilol (COREG) 25 MG tablet, Take 1 tablet (25 mg total) by mouth 2 (two) times daily with a meal., Disp: 60 tablet, Rfl: 11 .  clotrimazole-betamethasone (LOTRISONE) cream, Apply 1 application topically daily at 2 PM., Disp: 30 g, Rfl: 3 .  hydrochlorothiazide (HYDRODIURIL) 12.5 MG tablet, Take 1 tablet (12.5 mg  total) by mouth daily., Disp: 30 tablet, Rfl: 2 .  ketoconazole (NIZORAL) 2 % cream, Apply 1 application topically daily., Disp: 15 g, Rfl: 0 .  levETIRAcetam (KEPPRA) 500 MG tablet, Take 1 tablet (500 mg total) by mouth 2 (two) times daily., Disp: 180 tablet, Rfl: 3 .  lisinopril (PRINIVIL,ZESTRIL) 40 MG tablet, Take 1 tablet (40 mg total) by mouth daily., Disp: 30 tablet, Rfl: 5 .  potassium chloride SA (K-DUR,KLOR-CON) 20 MEQ tablet, Take 2 tablets (40 mEq total) by mouth daily., Disp: 180 tablet, Rfl: 3 .  traZODone (DESYREL) 50 MG tablet, Take 1 tablet (50 mg total) by mouth at bedtime as needed for sleep., Disp: 30 tablet, Rfl: 11   ROS:                                                                                                                                       History obtained from the patient  General  ROS: negative for - chills, fatigue, fever, night sweats, weight gain or weight loss Psychological ROS: negative for - behavioral disorder, hallucinations, memory difficulties, mood swings or suicidal ideation Ophthalmic ROS: negative for - blurry vision, double vision, eye pain or loss of vision ENT ROS: negative for - epistaxis, nasal discharge, oral lesions, sore throat, tinnitus or vertigo Allergy and Immunology ROS: negative for - hives or itchy/watery eyes Hematological and Lymphatic ROS: negative for - bleeding problems, bruising or swollen lymph nodes Endocrine ROS: negative for - galactorrhea, hair pattern changes, polydipsia/polyuria or temperature intolerance Respiratory ROS: negative for - cough, hemoptysis, shortness of breath or wheezing Cardiovascular ROS: negative for - chest pain, dyspnea on exertion, edema or irregular heartbeat Gastrointestinal ROS: negative for - abdominal pain, diarrhea, hematemesis, nausea/vomiting or stool incontinence Genito-Urinary ROS: negative for - dysuria, hematuria, incontinence or urinary frequency/urgency Musculoskeletal ROS:  negative for - joint swelling or muscular weakness Neurological ROS: as noted in HPI Dermatological ROS: negative for rash and skin lesion changes  Neurologic Examination:                                                                                                    Today's Vitals   10/19/15 1201 10/19/15 1206 10/19/15 1210  BP:  120/80   Pulse:  84   Temp:  98.1 F (36.7 C)   TempSrc:  Oral   Resp:  10   SpO2: 99% 100% 97%    Evaluation of higher integrative functions including: Level of alertness:Drowsy, easily arousable with verbal commands .  Oriented to time, place and person Speech:Mildly dysarthr speech at baseline , no aphasia noted.  Test the following cranial nerves: 2-12 grossly intact Motor examination: full 5/5 motor strength in right upper and lower extremities, mild baseline weakness in the left side .  Examination of sensation : grossly symmetric sensation to pinprick in all 4 extremities and on face Examination of deep tendon reflexes:  normal and symmetric in all extremities, normal plantars bilaterally Test coordination: Normal finger nose testing, with no evidence of limb appendicular ataxia or abnormal involuntary movements or tremors noted.  Gait: Deferred       Lab Results: Basic Metabolic Panel:  Recent Labs Lab 10/13/15 1417 10/19/15 1254  NA 142 144  K 3.7 3.5  CL 106 109  CO2 27 27  GLUCOSE 91 101*  BUN 14 14  CREATININE 1.11 1.22  CALCIUM 9.1 9.1    Liver Function Tests: No results for input(s): AST, ALT, ALKPHOS, BILITOT, PROT, ALBUMIN in the last 168 hours. No results for input(s): LIPASE, AMYLASE in the last 168 hours. No results for input(s): AMMONIA in the last 168 hours.  CBC:  Recent Labs Lab 10/13/15 1417 10/19/15 1254  WBC 2.9* 4.3  NEUTROABS  --  3.1  HGB 12.5* 13.1  HCT 36.5* 41.2  MCV 95.5 98.8  PLT 112* 110*    Cardiac Enzymes: No results for input(s): CKTOTAL, CKMB, CKMBINDEX, TROPONINI in the last 168  hours.  Lipid Panel: No results for input(s): CHOL, TRIG, HDL, CHOLHDL, VLDL, LDLCALC in the last  168 hours.  CBG: No results for input(s): GLUCAP in the last 168 hours.  Microbiology: Results for orders placed or performed during the hospital encounter of 11/05/14  Culture, Urine     Status: None   Collection Time: 11/14/14 12:24 AM  Result Value Ref Range Status   Specimen Description URINE, CLEAN CATCH  Final   Special Requests Normal  Final   Colony Count NO GROWTH Performed at [facility]   Final   Culture NO GROWTH Performed at [facility]   Final   Report Status 11/15/2014 FINAL  Final  Clostridium Difficile by PCR     Status: None   Collection Time: 11/14/14  6:30 PM  Result Value Ref Range Status   Toxigenic C Difficile by pcr NEGATIVE NEGATIVE Final  Culture, Urine     Status: None   Collection Time: 11/18/14  1:34 PM  Result Value Ref Range Status   Specimen Description URINE, RANDOM  Final   Special Requests NONE  Final   Colony Count   Final    >=100,000 COLONIES/ML Performed at [facility]    Culture   Final    ESCHERICHIA COLI Performed at [facility]    Report Status 11/21/2014 FINAL  Final   Organism ID, Bacteria ESCHERICHIA COLI  Final      Susceptibility   Escherichia coli - MIC*    AMPICILLIN 4 SENSITIVE Sensitive     CEFAZOLIN <=4 SENSITIVE Sensitive     CEFTRIAXONE <=1 SENSITIVE Sensitive     CIPROFLOXACIN <=0.25 SENSITIVE Sensitive     GENTAMICIN <=1 SENSITIVE Sensitive     LEVOFLOXACIN <=0.12 SENSITIVE Sensitive     NITROFURANTOIN <=16 SENSITIVE Sensitive     TOBRAMYCIN <=1 SENSITIVE Sensitive     TRIMETH/SULFA >=320 RESISTANT Resistant     PIP/TAZO <=4 SENSITIVE Sensitive     * ESCHERICHIA COLI     Imaging: Ct Head Wo Contrast  10/19/2015  CLINICAL DATA:  Several recent seizures EXAM: CT HEAD WITHOUT CONTRAST TECHNIQUE: Contiguous axial images were obtained from the base of the skull through the  vertex without intravenous contrast. COMPARISON:  June 24, 2015 FINDINGS: The ventricles are normal in size and configuration. There is no intracranial mass, hemorrhage, extra-axial fluid collection, or midline shift. There is evidence of a prior infarct involving the medial mid to superior right frontal lobe. There is stable patchy periventricular small vessel disease in the centra semiovale. There is also a prior small lacunar type infarct in the anterior right thalamus. No acute infarct evident. The bony calvarium appears intact. The mastoid air cells are clear. No intraorbital lesions. There is nasal turbinate edema bilaterally. There is an old healed fracture of the right nasal bone. There is mild leftward deviation of the nasal septum. IMPRESSION: Prior right frontal lobe infarct. Patchy small vessel disease in the periventricular white matter is stable. Prior small infarct anterior right thalamus. No acute infarct. No hemorrhage or mass effect. Nasal turbinate edema noted. Electronically Signed   By: Bretta BangWilliam  Woodruff III M.D.   On: 10/19/2015 13:28      Assessment and plan:   [name] Short is an 50 y.o. male patient who presented following a total of 6 episodes of convulsive seizures each lasting 3-4 minutes as described by his wife. He is nearly at his baseline mental status now, following commands appropriately with some mild residual left-sided weakness which is his baseline. Recommend a loading dose of IV phenytoin 1600 mg (20 mg/kg) for  possible status epilepticus given 6 episodes of convulsive seizures within a brief time period this morning.  We'll resume his Keppra, at a higher maintenance dose of 1 gm twice a day starting tonight. For now, ordered a loading dose of IV Keppra 1500 mg.  He'll be admitted to hospitalist service to stepdown unit, with cardiac telemetry monitoring for overnight observation. Seizure precautions, with when necessary Ativan ordered.  EEG ordered.  We'll  follow-up

## 2015-10-19 NOTE — ED Notes (Signed)
Paged TRH to [place], [place]

## 2015-10-19 NOTE — H&P (Signed)
[facility] History and Physical  [name] Short ZOX:[id number] DOB: May 10, 1966 DOA: 10/19/2015  Referring physician: Valarie Cones PCP: [name], MD   Chief Complaint: seizure  HPI: [name] Short is a 50 y.o. male with a past medical history that includes seizure with left hemiparesis, hypertension, tobacco use, cardiomyopathy resents to the emergency Department chief complaint of multiple seizures this morning. So evaluation reveals patient to be unresponsive with periods of apnea.  Information is obtained from the chart the patient and the wife who is at the bedside. Reportedly patient had 3 witnessed seizures this morning. Wife states she found him on the floor having a seizure today. She called EMS who witnessed 2 more episodes of seizure activity. They provided 5 mg of Versed IM. On arrival to the emergency department patient was unresponsive and having short episodes of apnea. EDP was about to intubate 1 patient awakened and demonstrated ability to protect his airway.  While he states there is been no recent illness fever chills cough cold nausea vomiting diarrhea or decreased oral intake. She reports he is compliant with his medications. He denies any chest pain palpitations headache visual disturbances. He reports his left-sided weakness is very close to baseline.   In the emergency department he's afebrile hemodynamically stable and not hypoxic. However initially GCS score 3 having periods of apnea an EDP about to intubate when he awakened. He demonstrated ability to protect his airway he follow commands seemed oriented to self and place.   Review of Systems:  10 point review of systems complete and all systems are negative except as indicated in the history of present illness  Past Medical History  Diagnosis Date  . Hypertension     a. Previously on meds but none in several years.  . Smoker     a. Previous tobacco - quit ~ 2013. Ongoing daily marijuana usage. Occasional  cigar.  . Stroke [facility])     a. 10/2014 MRI Head: acute mod size nonhemorrhagic R ant cerebral artery distribution infarct involving the medial aspect of the right frontal and parietal lobe and right aspect of the corpus callosum w/ local mass effect-->TPA;  b. 10/2014 MRA: Abrupt cut off of the R ACA A2 segment;  b. 10/2014 Carotid U/S: 1-39% bilat ICA stenosis.  . Cardiomyopathy (HCC)     a. 10/2014 Echo: EF 35-40%, diff HK, sev LVH, mod dil LA, PASP .;  b.  Echo 7/16: Severe LVH, EF 55-60%, aortic sclerosis without stenosis, mild AI, trivial MR, severe BAE  . Tobacco abuse    Past Surgical History  Procedure Laterality Date  . Tee without cardioversion N/A 11/03/2014    Procedure: TRANSESOPHAGEAL ECHOCARDIOGRAM (TEE);  Surgeon: Laurey Morale, MD;  Location: [facility] ENDOSCOPY;  Service: Cardiovascular;  Laterality: N/A;   Social History:  reports that he has been smoking Cigars.  He has never used smokeless tobacco. He reports that he uses illicit drugs. He reports that he does not drink alcohol. He is independent with ADLs in spite of his left hemiparesis. No Known Allergies  Family History  Problem Relation Age of Onset  . Other      no premature cad  . Diabetes Mother   . Heart disease Father   . Heart attack Father   . Stroke Sister   . Hypertension Father   . Hypertension Brother   . Hypertension Sister      Prior to Admission medications   Medication Sig Start Date End Date Taking? Authorizing Provider  amLODipine (  NORVASC) 10 MG tablet Take 1 tablet (10 mg total) by mouth daily. 09/30/15  Yes Josalyn Funches, MD  apixaban (ELIQUIS) 5 MG TABS tablet Take 1 tablet (5 mg total) by mouth 2 (two) times daily. 09/30/15  Yes Josalyn Funches, MD  atorvastatin (LIPITOR) 40 MG tablet Take 1 tablet (40 mg total) by mouth daily at 6 PM. 09/30/15  Yes Josalyn Funches, MD  carvedilol (COREG) 25 MG tablet Take 1 tablet (25 mg total) by mouth 2 (two) times daily with a meal. 03/15/15  Yes Jake BatheMark C  Skains, MD  clotrimazole-betamethasone (LOTRISONE) cream Apply 1 application topically daily at 2 PM. 08/19/15  Yes Helane GuntherGregory Mayer, DPM  hydrochlorothiazide (HYDRODIURIL) 12.5 MG tablet Take 1 tablet (12.5 mg total) by mouth daily. 09/30/15  Yes Josalyn Funches, MD  ketoconazole (NIZORAL) 2 % cream Apply 1 application topically daily. 09/30/15  Yes Josalyn Funches, MD  levETIRAcetam (KEPPRA) 500 MG tablet Take 1 tablet (500 mg total) by mouth 2 (two) times daily. 10/13/15 01/12/16 Yes Van ClinesKaren M Aquino, MD  lisinopril (PRINIVIL,ZESTRIL) 40 MG tablet Take 1 tablet (40 mg total) by mouth daily. 09/30/15  Yes Josalyn Funches, MD  potassium chloride SA (K-DUR,KLOR-CON) 20 MEQ tablet Take 2 tablets (40 mEq total) by mouth daily. 06/29/15  Yes Jake BatheMark C Skains, MD  traZODone (DESYREL) 50 MG tablet Take 1 tablet (50 mg total) by mouth at bedtime as needed for sleep. 05/12/15  Yes Dessa PhiJosalyn Funches, MD   Physical Exam: Filed Vitals:   10/19/15 1201 10/19/15 1206 10/19/15 1210  BP:  120/80   Pulse:  84   Temp:  98.1 F (36.7 C)   TempSrc:  Oral   Resp:  10   SpO2: 99% 100% 97%    Wt Readings from Last 3 Encounters:  10/13/15 81.194 kg (179 lb)  09/30/15 80.922 kg (178 lb 6.4 oz)  07/15/15 84.369 kg (186 lb)    General:  Appears calm and comfortable, slightly lethargic Eyes: PERRL, normal lids, irises & conjunctiva ENT: grossly normal hearing, lips & tongue, poor dentition Neck: no LAD, masses or thyromegaly Cardiovascular: RRR, no m/r/g. No LE edema.  Respiratory: CTA bilaterally, no w/r/r. Normal respiratory effort. Aspiration slightly shallow Abdomen: soft, ntnd no guarding or rebounding Skin: no rash or induration seen on limited exam Musculoskeletal: grossly normal tone BUE/BLE Psychiatric: grossly normal mood and affect, speech fluent and appropriate Neurologic: grossly non-focal. Speech slow and deliberate but clear able to follow commands able to make once a needs known left hemiparesis            Labs on Admission:  Basic Metabolic Panel:  Recent Labs Lab 10/13/15 1417 10/19/15 1254  NA 142 144  K 3.7 3.5  CL 106 109  CO2 27 27  GLUCOSE 91 101*  BUN 14 14  CREATININE 1.11 1.22  CALCIUM 9.1 9.1   Liver Function Tests: No results for input(s): AST, ALT, ALKPHOS, BILITOT, PROT, ALBUMIN in the last 168 hours. No results for input(s): LIPASE, AMYLASE in the last 168 hours. No results for input(s): AMMONIA in the last 168 hours. CBC:  Recent Labs Lab 10/13/15 1417 10/19/15 1254  WBC 2.9* 4.3  NEUTROABS  --  3.1  HGB 12.5* 13.1  HCT 36.5* 41.2  MCV 95.5 98.8  PLT 112* 110*   Cardiac Enzymes: No results for input(s): CKTOTAL, CKMB, CKMBINDEX, TROPONINI in the last 168 hours.  BNP (last 3 results) No results for input(s): BNP in the last 8760 hours.  ProBNP (last 3  results) No results for input(s): PROBNP in the last 8760 hours.  CBG: No results for input(s): GLUCAP in the last 168 hours.  Radiological Exams on Admission: Ct Head Wo Contrast  10/19/2015  CLINICAL DATA:  Several recent seizures EXAM: CT HEAD WITHOUT CONTRAST TECHNIQUE: Contiguous axial images were obtained from the base of the skull through the vertex without intravenous contrast. COMPARISON:  June 24, 2015 FINDINGS: The ventricles are normal in size and configuration. There is no intracranial mass, hemorrhage, extra-axial fluid collection, or midline shift. There is evidence of a prior infarct involving the medial mid to superior right frontal lobe. There is stable patchy periventricular small vessel disease in the centra semiovale. There is also a prior small lacunar type infarct in the anterior right thalamus. No acute infarct evident. The bony calvarium appears intact. The mastoid air cells are clear. No intraorbital lesions. There is nasal turbinate edema bilaterally. There is an old healed fracture of the right nasal bone. There is mild leftward deviation of the nasal septum. IMPRESSION:  Prior right frontal lobe infarct. Patchy small vessel disease in the periventricular white matter is stable. Prior small infarct anterior right thalamus. No acute infarct. No hemorrhage or mass effect. Nasal turbinate edema noted. Electronically Signed   By: Bretta Bang III M.D.   On: 10/19/2015 13:28    EKG: Atrial fibrillation Nonspecific intraventricular conduction delay Nonspecific T abnormalities, lateral leads Assessment/Plan Principal Problem:   Seizure (HCC) Active Problems:   Hyperlipidemia LDL goal <70   Cigarette smoker   Left hemiparesis (HCC)   Essential hypertension   Cardiomyopathy (HCC)   Atrial fibrillation (HCC)  #1. Seizure. Hx of same. Last seizure 03/2015. Patient with 3 witnessed seizures at home by wife and EMS. Provided with Versed as noted above. Arrived to the emergency department unresponsive and unable to protect airway. CT of the head with prior right frontal lobe infarct patchy small vessel disease no acute infarct no hemorrhage or mass. Patient is afebrile hemodynamically stable and not hypoxic. Lab work reveals no metabolic derangements. Chest x-ray cardiac silhouette enlargement suggesting cardiomegaly or pericardial effusion. Urinalysis is pending. Was evaluated by neurology in the emergency department who provided loading Keppra and maintenance Keppra doses recommended step down admission -Admit to step down -Continuous pulse ox -Keppra as noted by neurology -Seizure precautions -Keep nothing by mouth until fully awake  #2. A. fib. EKG with A. Fib. Rate controlled.Chadvasc score 5. Ration on Eliquis.  -Monitor on telemetry -Continue Coreg -Continue Eliquis -Monitor  3. Hypertension. Controlled in the emergency department. Home medications include amlodipine, Coreg, hydrochlorothiazide, lisinopril -Continue home medications -Monitor closely  #4. Hyperlipidemia. Home medications include Lipitor -Obtain a lipid panel -Continue Lipitor  #5. Left  hemiparesis. Reports at baseline. -We'll order physical therapy  #6. Cardiomyopathy. Appears euvolemic. Echo done in July 2016 LVEF  55-60%, there is severe LVH and severe biatrial enlargement. -Obtain daily weights -Monitor intake and output -Continue home meds  Neuro: Nandigam   Code Status: full DVT Prophylaxis: Family Communication: wife at bedside Disposition Plan: home hopefully 24-36 hours  Time spent: 60 minutes  [facility]

## 2015-10-19 NOTE — ED Provider Notes (Signed)
CSN: [id number]     Arrival date & time 10/19/15  1201 History   First MD Initiated Contact with Patient 10/19/15 1219     Chief Complaint  Patient presents with  . Seizures   Level V Caveat: Altered mental status, unresponsive  Blanca Thornton is a 50 y.o. male who presents to the ED via EMS after a witnessed seizure today. Patient has a history of seizures and previous stroke with left-sided deficits. According to the patient's spouse she found him on the floor having a seizure today. He had 3 witnessed seizures by EMS. He received 5 mg of Versed IM by EMS. Patient is unresponsive. According to the spouse he has been taking his medications appropriately. He is not on any narcotic pain medicines. No recent illness.    (Consider location/radiation/quality/duration/timing/severity/associated sxs/prior Treatment) The history is provided by the spouse, the EMS personnel and medical records. No language interpreter was used.    Past Medical History  Diagnosis Date  . Hypertension     a. Previously on meds but none in several years.  . Smoker     a. Previous tobacco - quit ~ 2013. Ongoing daily marijuana usage. Occasional cigar.  . Stroke [facility])     a. 10/2014 MRI Head: acute mod size nonhemorrhagic R ant cerebral artery distribution infarct involving the medial aspect of the right frontal and parietal lobe and right aspect of the corpus callosum w/ local mass effect-->TPA;  b. 10/2014 MRA: Abrupt cut off of the R ACA A2 segment;  b. 10/2014 Carotid U/S: 1-39% bilat ICA stenosis.  . Cardiomyopathy (HCC)     a. 10/2014 Echo: EF 35-40%, diff HK, sev LVH, mod dil LA, PASP .;  b.  Echo 7/16: Severe LVH, EF 55-60%, aortic sclerosis without stenosis, mild AI, trivial MR, severe BAE  . Tobacco abuse    Past Surgical History  Procedure Laterality Date  . Tee without cardioversion N/A 11/03/2014    Procedure: TRANSESOPHAGEAL ECHOCARDIOGRAM (TEE);  Surgeon: Laurey Morale, MD;  Location: [facility] ENDOSCOPY;   Service: Cardiovascular;  Laterality: N/A;   Family History  Problem Relation Age of Onset  . Other      no premature cad  . Diabetes Mother   . Heart disease Father   . Heart attack Father   . Stroke Sister   . Hypertension Father   . Hypertension Brother   . Hypertension Sister    Social History  Substance Use Topics  . Smoking status: Current Every Day Smoker    Types: Cigars  . Smokeless tobacco: Never Used     Comment: previously smoked cigarettes - quit ~ 2013.  Marland Kitchen Alcohol Use: No     Comment: Alcohol use stopped in April 2016.    Review of Systems  Unable to perform ROS: Patient unresponsive      Allergies  Review of patient's allergies indicates no known allergies.  Home Medications   Prior to Admission medications   Medication Sig Start Date End Date Taking? Authorizing Provider  amLODipine (NORVASC) 10 MG tablet Take 1 tablet (10 mg total) by mouth daily. 09/30/15  Yes Josalyn Funches, MD  apixaban (ELIQUIS) 5 MG TABS tablet Take 1 tablet (5 mg total) by mouth 2 (two) times daily. 09/30/15  Yes Josalyn Funches, MD  atorvastatin (LIPITOR) 40 MG tablet Take 1 tablet (40 mg total) by mouth daily at 6 PM. 09/30/15  Yes Josalyn Funches, MD  carvedilol (COREG) 25 MG tablet Take 1 tablet (25 mg total) by  mouth 2 (two) times daily with a meal. 03/15/15  Yes Jake Bathe, MD  clotrimazole-betamethasone (LOTRISONE) cream Apply 1 application topically daily at 2 PM. 08/19/15  Yes Helane Gunther, DPM  hydrochlorothiazide (HYDRODIURIL) 12.5 MG tablet Take 1 tablet (12.5 mg total) by mouth daily. 09/30/15  Yes Josalyn Funches, MD  ketoconazole (NIZORAL) 2 % cream Apply 1 application topically daily. 09/30/15  Yes Josalyn Funches, MD  levETIRAcetam (KEPPRA) 500 MG tablet Take 1 tablet (500 mg total) by mouth 2 (two) times daily. 10/13/15 01/12/16 Yes Van Clines, MD  lisinopril (PRINIVIL,ZESTRIL) 40 MG tablet Take 1 tablet (40 mg total) by mouth daily. 09/30/15  Yes Josalyn Funches, MD   potassium chloride SA (K-DUR,KLOR-CON) 20 MEQ tablet Take 2 tablets (40 mEq total) by mouth daily. 06/29/15  Yes Jake Bathe, MD  traZODone (DESYREL) 50 MG tablet Take 1 tablet (50 mg total) by mouth at bedtime as needed for sleep. 05/12/15  Yes Josalyn Funches, MD   BP 162/103 mmHg  Pulse 72  Temp(Src) 98.1 F (36.7 C) (Oral)  Resp 20  SpO2 100% Physical Exam  Constitutional: He appears well-developed and well-nourished. No distress.  Patient unresponsive. Non-toxic appearing.   HENT:  Head: Normocephalic and atraumatic.  Right Ear: External ear normal.  Left Ear: External ear normal.  No visible signs of head trauma.  Eyes: Conjunctivae are normal. Pupils are equal, round, and reactive to light. Right eye exhibits no discharge. Left eye exhibits no discharge.  Neck: Neck supple. No tracheal deviation present.  Cardiovascular: Normal rate, regular rhythm, normal heart sounds and intact distal pulses.   Pulmonary/Chest: Breath sounds normal. He has no wheezes. He has no rales.  When I walked in the room the patient is not breathing. With stimulation he began breathing again.  Oxygen saturation is maintaining between 96 and 100% on 2 L via [facility].  Lungs CTA bilaterally.   Abdominal: Soft.  Musculoskeletal: He exhibits no edema.  Neurological: He is unresponsive. GCS eye subscore is 1. GCS verbal subscore is 1. GCS motor subscore is 1.  Patient is unresponsive at my initial evaluation with GCS of 3. No seizure activity.   Skin: Skin is warm and dry. No rash noted. He is not diaphoretic. No erythema. No pallor.  Nursing note and vitals reviewed.   ED Course  Procedures (including critical care time) Labs Review Labs Reviewed  BASIC METABOLIC PANEL - Abnormal; Notable for the following:    Glucose, Bld 101 (*)    All other components within normal limits  CBC WITH DIFFERENTIAL/PLATELET - Abnormal; Notable for the following:    RBC 4.17 (*)    Platelets 110 (*)    Lymphs Abs 0.6  (*)    All other components within normal limits  URINE RAPID DRUG SCREEN, HOSP PERFORMED - Abnormal; Notable for the following:    Benzodiazepines POSITIVE (*)    Tetrahydrocannabinol POSITIVE (*)    All other components within normal limits  CULTURE, BLOOD (ROUTINE X 2)  CULTURE, BLOOD (ROUTINE X 2)  ETHANOL  URINALYSIS, ROUTINE W REFLEX MICROSCOPIC (NOT AT [facility])  MAGNESIUM  BASIC METABOLIC PANEL  CBC  CBG MONITORING, ED    Imaging Review Ct Head Wo Contrast  10/19/2015  CLINICAL DATA:  Several recent seizures EXAM: CT HEAD WITHOUT CONTRAST TECHNIQUE: Contiguous axial images were obtained from the base of the skull through the vertex without intravenous contrast. COMPARISON:  June 24, 2015 FINDINGS: The ventricles are normal in size and configuration. There  is no intracranial mass, hemorrhage, extra-axial fluid collection, or midline shift. There is evidence of a prior infarct involving the medial mid to superior right frontal lobe. There is stable patchy periventricular small vessel disease in the centra semiovale. There is also a prior small lacunar type infarct in the anterior right thalamus. No acute infarct evident. The bony calvarium appears intact. The mastoid air cells are clear. No intraorbital lesions. There is nasal turbinate edema bilaterally. There is an old healed fracture of the right nasal bone. There is mild leftward deviation of the nasal septum. IMPRESSION: Prior right frontal lobe infarct. Patchy small vessel disease in the periventricular white matter is stable. Prior small infarct anterior right thalamus. No acute infarct. No hemorrhage or mass effect. Nasal turbinate edema noted. Electronically Signed   By: Bretta Bang III M.D.   On: 10/19/2015 13:28   Dg Chest Port 1 View  10/19/2015  CLINICAL DATA:  Seizure this morning with low oxygen saturation EXAM: PORTABLE CHEST 1 VIEW COMPARISON:  None. FINDINGS: Mild enlargement of cardiac silhouette. The vascular  pattern is within normal limits. Lungs are clear with no evidence of edema or effusion. The left costophrenic angle however is excluded from the field of view. IMPRESSION: Cardiac silhouette enlargement suggesting cardiomegaly or pericardial effusion. Electronically Signed   By: Esperanza Heir M.D.   On: 10/19/2015 15:09   I have personally reviewed and evaluated these images and lab results as part of my medical decision-making.   EKG Interpretation   Date/Time:  Tuesday October 19 2015 12:31:09 EDT Ventricular Rate:  92 PR Interval:    QRS Duration: 115 QT Interval:  436 QTC Calculation: 539 R Axis:   -13 Text Interpretation:  Atrial fibrillation Nonspecific intraventricular  conduction delay Nonspecific T abnormalities, lateral leads Confirmed by  [facility] MD, JASON [phone]) on 10/19/2015 1:27:21 PM     Filed Vitals:   10/19/15 1500 10/19/15 1515 10/19/15 1530 10/19/15 1545  BP: 155/96 172/93 148/97 162/103  Pulse: 88 75 72 72  Temp:      TempSrc:      Resp: 14 26 13 20   SpO2: 98% 100% 100% 100%    MDM   Meds given in ED:  Medications  phenytoin (DILANTIN) 1,600 mg in sodium chloride 0.9 % 250 mL IVPB (not administered)  0.9 %  sodium chloride infusion (not administered)  levETIRAcetam (KEPPRA) tablet 1,000 mg (not administered)  amLODipine (NORVASC) tablet 10 mg (not administered)  apixaban (ELIQUIS) tablet 5 mg (not administered)  atorvastatin (LIPITOR) tablet 40 mg (not administered)  hydrochlorothiazide (HYDRODIURIL) tablet 12.5 mg (not administered)  lisinopril (PRINIVIL,ZESTRIL) tablet 40 mg (not administered)  potassium chloride SA (K-DUR,KLOR-CON) CR tablet 40 mEq (not administered)  carvedilol (COREG) tablet 25 mg (not administered)  sodium chloride flush (NS) 0.9 % injection 3 mL (not administered)  acetaminophen (TYLENOL) tablet 650 mg (not administered)    Or  acetaminophen (TYLENOL) suppository 650 mg (not administered)  morphine 2 MG/ML injection 1 mg (not  administered)  ondansetron (ZOFRAN) tablet 4 mg (not administered)    Or  ondansetron (ZOFRAN) injection 4 mg (not administered)  LORazepam (ATIVAN) injection 1 mg (not administered)  sodium chloride 0.9 % bolus 1,000 mL (1,000 mLs Intravenous New Bag/Given 10/19/15 1249)  magnesium sulfate IVPB 2 g 50 mL (2 g Intravenous New Bag/Given 10/19/15 1438)  levETIRAcetam (KEPPRA) 1,500 mg in sodium chloride 0.9 % 100 mL IVPB (1,500 mg Intravenous New Bag/Given 10/19/15 1601)    New Prescriptions   No  medications on file    Final diagnoses:  Seizure [facility](HCC)   This is a 50 y.o. male who presents to the ED via EMS after a witnessed seizure today. Patient has a history of seizures and previous stroke with left-sided deficits. According to the patient's spouse she found him on the floor having a seizure today. He had 3 witnessed seizures by EMS. He received 5 mg of Versed IM by EMS. Patient is unresponsive.  I was called to the patient's room from another patient's room. Patient was not breathing on my arrival. With physical stimulation the patient started breathing again. He has no gag reflex and does not appear to be able to maintain his airway on his own. We initiated plans for intubation. Patient still breathing on own with stimulation. GCS of 3 initially.   As we set up for intubation the patient's mental status rapidly improved. He would open his eyes to verbal command and open is mouth to verbal command. He is able to maintain his airway. Will hold off on intubation and do STAT CT head.  CT head showed no acute changes.  After multiple reevaluations the patient was verbal and could tell me that he remembers feeling his leg shaking before his seizure. Will consult to neurohospitalist.  I consulted with Dr. Lavon PaganiniNandigam who would like the patient admitted to step down by hospitalist.  I consulted with [facility] who accepted the patient for admission to stepdown.  This patient was discussed with and  evaluated by Dr. Clayborne DanaMesner who agrees with assessment and plan.   CRITICAL CARE Performed by: Lawana Chambersansie,Mylinda Brook Duncan   Total critical care time: 40 minutes  Critical care time was exclusive of separately billable procedures and treating other patients.  Critical care was necessary to treat or prevent imminent or life-threatening deterioration.  Critical care was time spent personally by me on the following activities: Multiple airway checks, development of treatment plan with patient and/or surrogate as well as nursing, discussions with consultants, evaluation of patient's response to treatment, examination of patient, obtaining history from patient or surrogate, ordering and performing treatments and interventions, ordering and review of laboratory studies, ordering and review of radiographic studies, pulse oximetry and re-evaluation of patient's condition.   Everlene FarrierWilliam Macen Joslin, PA-C 10/19/15 1744  Marily MemosJason Mesner, MD 10/20/15 [phone]

## 2015-10-20 DIAGNOSIS — G8194 Hemiplegia, unspecified affecting left nondominant side: Secondary | ICD-10-CM

## 2015-10-20 DIAGNOSIS — R569 Unspecified convulsions: Secondary | ICD-10-CM | POA: Diagnosis not present

## 2015-10-20 DIAGNOSIS — I429 Cardiomyopathy, unspecified: Secondary | ICD-10-CM

## 2015-10-20 DIAGNOSIS — I10 Essential (primary) hypertension: Secondary | ICD-10-CM | POA: Diagnosis not present

## 2015-10-20 LAB — CBC
HCT: 40.5 % (ref 39.0–52.0)
HEMOGLOBIN: 13 g/dL (ref 13.0–17.0)
MCH: 31.3 pg (ref 26.0–34.0)
MCHC: 32.1 g/dL (ref 30.0–36.0)
MCV: 97.6 fL (ref 78.0–100.0)
PLATELETS: 121 10*3/uL — AB (ref 150–400)
RBC: 4.15 MIL/uL — AB (ref 4.22–5.81)
RDW: 13 % (ref 11.5–15.5)
WBC: 4.8 10*3/uL (ref 4.0–10.5)

## 2015-10-20 LAB — MRSA PCR SCREENING: MRSA BY PCR: NEGATIVE

## 2015-10-20 LAB — BASIC METABOLIC PANEL
Anion gap: 7 (ref 5–15)
BUN: 8 mg/dL (ref 6–20)
CALCIUM: 9 mg/dL (ref 8.9–10.3)
CHLORIDE: 106 mmol/L (ref 101–111)
CO2: 27 mmol/L (ref 22–32)
CREATININE: 1.03 mg/dL (ref 0.61–1.24)
GFR calc non Af Amer: >60 mL/min (ref >60)
GLUCOSE: 91 mg/dL (ref 65–99)
Potassium: 3.5 mmol/L (ref 3.5–5.1)
Sodium: 140 mmol/L (ref 135–145)

## 2015-10-20 LAB — MAGNESIUM: MAGNESIUM: 1.9 mg/dL (ref 1.7–2.4)

## 2015-10-20 MED ORDER — HYDROCHLOROTHIAZIDE 12.5 MG PO CAPS
12.5000 mg | ORAL_CAPSULE | Freq: Every day | ORAL | Status: DC
  Administered 2015-10-20: 12.5 mg via ORAL
  Filled 2015-10-20: qty 1

## 2015-10-20 MED ORDER — LEVETIRACETAM 1000 MG PO TABS: 1000.0000 mg | ORAL_TABLET | Freq: Two times a day (BID) | ORAL | Status: DC

## 2015-10-20 NOTE — Care Management Note (Signed)
Case Management Note  Patient Details  Name: [name] Short MRN: [id number] Date of Birth: Jul 26, 1965  Subjective/Objective: Patient lives with girlfriend Melene PlanSherivian Sells , patient uses a cane and walker at home.  He has medicaid which does not pay for hhpt unless he had a cva on this admission.  Patient does not wish to pay privately for hhpt.  He has an aide for two hours a day seven days a week.  He will have transportation home by daughter today.  [facility] he states [facility].                     Action/Plan:   Expected Discharge Date:                  Expected Discharge Plan:  Home/Self Care  In-House Referral:     Discharge planning Services  CM Consult  Post Acute Care Choice:    Choice offered to:     DME Arranged:    DME Agency:     HH Arranged:    HH Agency:     Status of Service:  Completed, signed off  Medicare Important Message Given:    Date Medicare IM Given:    Medicare IM give by:    Date Additional Medicare IM Given:    Additional Medicare Important Message give by:     If discussed at Long Length of Stay Meetings, dates discussed:    Additional Comments:  Leone Havenaylor, Wells Gerdeman Clinton, RN 10/20/2015, 4:34 PM

## 2015-10-20 NOTE — Discharge Summary (Signed)
Physician Discharge Summary  [name] Short JXB:[id number] DOB: 1965/08/21 DOA: 10/19/2015  PCP: [name], MD  Admit date: 10/19/2015 Discharge date: 10/20/2015  Time spent: 35 minutes  Recommendations for Outpatient Follow-up:  1. [name] Short admitted for recurrent seizures, seen by neurology had his Keppra increased from [phone] g by mouth twice a day during this hospitalization. Please follow.  Discharge Diagnoses:  Principal Problem:   Seizure (HCC) Active Problems:   Hyperlipidemia LDL goal <70   Cigarette smoker   Left hemiparesis (HCC)   Essential hypertension   Cardiomyopathy (HCC)   Atrial fibrillation (HCC)   Discharge Condition: Stable  Diet recommendation: Heart healthy  Filed Weights   10/20/15 0200  Weight: 79.2 kg (174 lb 9.7 oz)    History of present illness:  [name] Short is a 50 y.o. male with a past medical history that includes seizure with left hemiparesis, hypertension, tobacco use, cardiomyopathy resents to the emergency Department chief complaint of multiple seizures this morning. So evaluation reveals patient to be unresponsive with periods of apnea.  Information is obtained from the chart the patient and the wife who is at the bedside. Reportedly patient had 3 witnessed seizures this morning. Wife states she found him on the floor having a seizure today. She called EMS who witnessed 2 more episodes of seizure activity. They provided 5 mg of Versed IM. On arrival to the emergency department patient was unresponsive and having short episodes of apnea. EDP was about to intubate 1 patient awakened and demonstrated ability to protect his airway.  While he states there is been no recent illness fever chills cough cold nausea vomiting diarrhea or decreased oral intake. She reports he is compliant with his medications. He denies any chest pain palpitations headache visual disturbances. He reports his left-sided weakness is very close to baseline.   In  the emergency department he's afebrile hemodynamically stable and not hypoxic. However initially GCS score 3 having periods of apnea an EDP about to intubate when he awakened. He demonstrated ability to protect his airway he follow commands seemed oriented to self and place.  Hospital Course:  [name] Short is a 50 year old gentleman with a past medical history of seizures, history of CVA in 2016, cardiomyopathy, admitted to the medicine service on 10/11/2015 when he presented after having multiple seizures at home. On arrival he was found to be postictal. He was given Versed 5 mg on the field by EMS. He had been taking Keppra 500 mg by mouth twice a day having his last seizure in September 2016. He was seen and evaluated by neurology for which she was given loading dose of IV Keppra at 1500 mg. Neurology also recommended increasing his Keppra dose to 1000 mg by mouth twice a day. He did not have further seizures during this hospitalization. His labs remained stable as he was neurologically intact. Other medication changes include the discontinuation of trazodone given concerns for this medication precipitating seizures as well. On further discussion he thinks he may have skipped a dose of his Keppra. He was discharged on 10/20/2015 in stable condition.   Consultations:  Neurology  Discharge Exam: Filed Vitals:   10/20/15 0714 10/20/15 0754  BP: 142/90 142/90  Pulse: 65 78  Temp: 98.4 F (36.9 C)   Resp: 17     General: Awake and alert, nontoxic-appearing, asking to go home today. Cardiovascular: Regular rate and rhythm normal S1-S2 no murmurs or gallops Respiratory: Normal respiratory effort lungs are clear Abdomen: Soft nontender nondistended  Discharge Instructions   Discharge Instructions    Call MD for:  difficulty breathing, headache or visual disturbances    Complete by:  As directed      Call MD for:  extreme fatigue    Complete by:  As directed      Call MD for:  hives     Complete by:  As directed      Call MD for:  persistant dizziness or light-headedness    Complete by:  As directed      Call MD for:  persistant nausea and vomiting    Complete by:  As directed      Call MD for:  redness, tenderness, or signs of infection (pain, swelling, redness, odor or green/yellow discharge around incision site)    Complete by:  As directed      Call MD for:  severe uncontrolled pain    Complete by:  As directed      Call MD for:  temperature >100.4    Complete by:  As directed      Call MD for:    Complete by:  As directed      Diet - low sodium heart healthy    Complete by:  As directed      Increase activity slowly    Complete by:  As directed           Current Discharge Medication List    CONTINUE these medications which have CHANGED   Details  levETIRAcetam (KEPPRA) 1000 MG tablet Take 1 tablet (1,000 mg total) by mouth 2 (two) times daily. Qty: 60 tablet, Refills: 0      CONTINUE these medications which have NOT CHANGED   Details  amLODipine (NORVASC) 10 MG tablet Take 1 tablet (10 mg total) by mouth daily. Qty: 30 tablet, Refills: 5   Associated Diagnoses: Essential hypertension    apixaban (ELIQUIS) 5 MG TABS tablet Take 1 tablet (5 mg total) by mouth 2 (two) times daily. Qty: 180 tablet, Refills: 3   Associated Diagnoses: History of stroke    atorvastatin (LIPITOR) 40 MG tablet Take 1 tablet (40 mg total) by mouth daily at 6 PM. Qty: 30 tablet, Refills: 5   Associated Diagnoses: Acute ischemic right anterior cerebral artery (ACA) stroke (HCC)    carvedilol (COREG) 25 MG tablet Take 1 tablet (25 mg total) by mouth 2 (two) times daily with a meal. Qty: 60 tablet, Refills: 11   Associated Diagnoses: Essential hypertension    clotrimazole-betamethasone (LOTRISONE) cream Apply 1 application topically daily at 2 PM. Qty: 30 g, Refills: 3    hydrochlorothiazide (HYDRODIURIL) 12.5 MG tablet Take 1 tablet (12.5 mg total) by mouth daily. Qty: 30  tablet, Refills: 2   Associated Diagnoses: Onychomycosis of toenail    ketoconazole (NIZORAL) 2 % cream Apply 1 application topically daily. Qty: 15 g, Refills: 0   Associated Diagnoses: Intertrigo    lisinopril (PRINIVIL,ZESTRIL) 40 MG tablet Take 1 tablet (40 mg total) by mouth daily. Qty: 30 tablet, Refills: 5   Associated Diagnoses: Essential hypertension    potassium chloride SA (K-DUR,KLOR-CON) 20 MEQ tablet Take 2 tablets (40 mEq total) by mouth daily. Qty: 180 tablet, Refills: 3      STOP taking these medications     traZODone (DESYREL) 50 MG tablet        No Known Allergies Follow-up Information    Follow up with [name], [name], MD In 1 week.   Specialty:  Family Medicine  Contact information:   [address] [place] [address] [phone]        The results of significant diagnostics from this hospitalization (including imaging, microbiology, ancillary and laboratory) are listed below for reference.    Significant Diagnostic Studies: Ct Head Wo Contrast  10/19/2015  CLINICAL DATA:  Several recent seizures EXAM: CT HEAD WITHOUT CONTRAST TECHNIQUE: Contiguous axial images were obtained from the base of the skull through the vertex without intravenous contrast. COMPARISON:  June 24, 2015 FINDINGS: The ventricles are normal in size and configuration. There is no intracranial mass, hemorrhage, extra-axial fluid collection, or midline shift. There is evidence of a prior infarct involving the medial mid to superior right frontal lobe. There is stable patchy periventricular small vessel disease in the centra semiovale. There is also a prior small lacunar type infarct in the anterior right thalamus. No acute infarct evident. The bony calvarium appears intact. The mastoid air cells are clear. No intraorbital lesions. There is nasal turbinate edema bilaterally. There is an old healed fracture of the right nasal bone. There is mild leftward deviation of the nasal  septum. IMPRESSION: Prior right frontal lobe infarct. Patchy small vessel disease in the periventricular white matter is stable. Prior small infarct anterior right thalamus. No acute infarct. No hemorrhage or mass effect. Nasal turbinate edema noted. Electronically Signed   By: Bretta Bang III M.D.   On: 10/19/2015 13:28   Dg Chest Port 1 View  10/19/2015  CLINICAL DATA:  Seizure this morning with low oxygen saturation EXAM: PORTABLE CHEST 1 VIEW COMPARISON:  None. FINDINGS: Mild enlargement of cardiac silhouette. The vascular pattern is within normal limits. Lungs are clear with no evidence of edema or effusion. The left costophrenic angle however is excluded from the field of view. IMPRESSION: Cardiac silhouette enlargement suggesting cardiomegaly or pericardial effusion. Electronically Signed   By: Esperanza Heir M.D.   On: 10/19/2015 15:09    Microbiology: No results found for this or any previous visit (from the past 240 hour(s)).   Labs: Basic Metabolic Panel:  Recent Labs Lab 10/13/15 1417 10/19/15 1254 10/20/15 0150 10/20/15 0526  NA 142 144  --  140  K 3.7 3.5  --  3.5  CL 106 109  --  106  CO2 27 27  --  27  GLUCOSE 91 101*  --  91  BUN 14 14  --  8  CREATININE 1.11 1.22  --  1.03  CALCIUM 9.1 9.1  --  9.0  MG  --   --  1.9  --    Liver Function Tests: No results for input(s): AST, ALT, ALKPHOS, BILITOT, PROT, ALBUMIN in the last 168 hours. No results for input(s): LIPASE, AMYLASE in the last 168 hours. No results for input(s): AMMONIA in the last 168 hours. CBC:  Recent Labs Lab 10/13/15 1417 10/19/15 1254 10/20/15 0526  WBC 2.9* 4.3 4.8  NEUTROABS  --  3.1  --   HGB 12.5* 13.1 13.0  HCT 36.5* 41.2 40.5  MCV 95.5 98.8 97.6  PLT 112* 110* 121*   Cardiac Enzymes: No results for input(s): CKTOTAL, CKMB, CKMBINDEX, TROPONINI in the last 168 hours. BNP: BNP (last 3 results) No results for input(s): BNP in the last 8760 hours.  ProBNP (last 3  results) No results for input(s): PROBNP in the last 8760 hours.  CBG: No results for input(s): GLUCAP in the last 168 hours.     Signed:  Jeralyn Bennett MD.  [facility] 10/20/2015,  8:09 AM

## 2015-10-20 NOTE — Progress Notes (Addendum)
DC instructions given to patient. VSS. PIV DC, hemostasis achieved. Educated patient on importance of medication regimen. eICU and CCMD notified of DC. All belongings sent home with patient.  1800: Awaiting pt to dress and ride, pt refused help with ADLs. Will continue to monitor for transportation.  1833: Pt escorted by wheelchair by family. All belongings sent. Pt refused coreg by RN.

## 2015-10-20 NOTE — Progress Notes (Signed)
Pt transferred to 3s15 from the ED after report.  Pt A&Ox4 with callbell within reach.  Will continue to monitor.

## 2015-10-20 NOTE — Progress Notes (Signed)
Physician notified: Nandigam At: 1555  Regarding: Awaiting neurology clearance for DC today. Awaiting return response.   Returned Response at: 1559  Order(s): OK to DC by Neuro.

## 2015-10-20 NOTE — Progress Notes (Signed)
Interval History:                                                                                                                      [name] Short is an 50 y.o. male with no further seizures after increasing his Keppra. He has had no SE from the Keppra.    Past Medical History: Past Medical History  Diagnosis Date  . Hypertension     a. Previously on meds but none in several years.  . Smoker     a. Previous tobacco - quit ~ 2013. Ongoing daily marijuana usage. Occasional cigar.  . Cardiomyopathy (HCC)     a. 10/2014 Echo: EF 35-40%, diff HK, sev LVH, mod dil LA, PASP 45mmHg.;  b.  Echo 7/16: Severe LVH, EF 55-60%, aortic sclerosis without stenosis, mild AI, trivial MR, severe BAE  . Tobacco abuse   . Tuberculosis     "took RX for 8037yr in the 1990s" (10/19/2015)  . Stroke [facility](HCC)     a. 10/2014 MRI Head: acute mod size nonhemorrhagic R ant cerebral artery distribution infarct involving the medial aspect of the right frontal and parietal lobe and right aspect of the corpus callosum w/ local mass effect-->TPA;  b. 10/2014 MRA: Abrupt cut off of the R ACA A2 segment;  b. 10/2014 Carotid U/S: 1-39% bilat ICA stenosis.  . Seizures (HCC) since 03/2015  . Depression     Past Surgical History  Procedure Laterality Date  . Tee without cardioversion N/A 11/03/2014    Procedure: TRANSESOPHAGEAL ECHOCARDIOGRAM (TEE);  Surgeon: Laurey Moralealton S McLean, MD;  [facility];  Service: Cardiovascular;  Laterality: N/A;  . Multiple tooth extractions    . Nasal fracture surgery  1983  . Fracture surgery      Family History: Family History  Problem Relation Age of Onset  . Other      no premature cad  . Diabetes Mother   . Heart disease Father   . Heart attack Father   . Stroke Sister   . Hypertension Father   . Hypertension Brother   . Hypertension Sister     Social History:   reports that he has been smoking Cigars.  He has never used smokeless tobacco. He reports that he drinks alcohol. He  reports that he uses illicit drugs (Marijuana).  Allergies:  No Known Allergies   Medications:  Short facility-administered medications:  .  0.9 %  sodium chloride infusion, , Intravenous, Continuous, Ram Daniel Nones, MD, Last Rate: 50 mL/hr at 10/19/15 2206 .  acetaminophen (TYLENOL) tablet 650 mg, 650 mg, Oral, Q6H PRN **OR** acetaminophen (TYLENOL) suppository 650 mg, 650 mg, Rectal, Q6H PRN, Lesle Chris Black, NP .  amLODipine (NORVASC) tablet 10 mg, 10 mg, Oral, Daily, Lesle Chris Black, NP, 10 mg at 10/20/15 1018 .  apixaban (ELIQUIS) tablet 5 mg, 5 mg, Oral, BID, Lesle Chris Black, NP, 5 mg at 10/20/15 1018 .  atorvastatin (LIPITOR) tablet 40 mg, 40 mg, Oral, q1800, Gwenyth Bender, NP, 40 mg at 10/19/15 2202 .  carvedilol (COREG) tablet 25 mg, 25 mg, Oral, BID WC, Lesle Chris Black, NP, 25 mg at 10/20/15 0754 .  hydrochlorothiazide (MICROZIDE) capsule 12.5 mg, 12.5 mg, Oral, Daily, Lesle Chris Black, NP, 12.5 mg at 10/20/15 1017 .  levETIRAcetam (KEPPRA) tablet 1,000 mg, 1,000 mg, Oral, BID, Ram Daniel Nones, MD, 1,000 mg at 10/20/15 7062 .  lisinopril (PRINIVIL,ZESTRIL) tablet 40 mg, 40 mg, Oral, Daily, Lesle Chris Black, NP, 40 mg at 10/20/15 1018 .  LORazepam (ATIVAN) injection 1 mg, 1 mg, Intravenous, Q2H PRN, Ram Daniel Nones, MD .  morphine 2 MG/ML injection 1 mg, 1 mg, Intravenous, Q3H PRN, Lesle Chris Black, NP .  ondansetron (ZOFRAN) tablet 4 mg, 4 mg, Oral, Q6H PRN **OR** ondansetron (ZOFRAN) injection 4 mg, 4 mg, Intravenous, Q6H PRN, Lesle Chris Black, NP .  potassium chloride SA (K-DUR,KLOR-CON) CR tablet 40 mEq, 40 mEq, Oral, Daily, Lesle Chris Black, NP, 40 mEq at 10/20/15 1018 .  sodium chloride flush (NS) 0.9 % injection 3 mL, 3 mL, Intravenous, Q12H, Gwenyth Bender, NP, 3 mL at 10/19/15 2207   Neurologic Examination:                                                                                                      Today's Vitals   10/20/15 0304 10/20/15 0305 10/20/15 0714 10/20/15 0754  BP:  134/88 142/90 142/90  Pulse:  69 65 78  Temp: 99.5 F (37.5 C)  98.4 F (36.9 C)   TempSrc: Oral  Oral   Resp:  11 17   Height:      Weight:      SpO2:  100% 100%    Evaluation of higher integrative functions including: Level of alertness:Drowsy, easily arousable with verbal commands .  Oriented to time, place and person Speech:Mildly dysarthr speech at baseline , no aphasia noted.  Test the following cranial nerves: 2-12 grossly intact, left facial droop.  Motor examination: full 5/5 motor strength in right upper and lower extremities, mild baseline weakness in the left side .  Examination of sensation : grossly symmetric sensation to pinprick in all 4 extremities and on face Examination of deep tendon reflexes: normal and symmetric in all extremities, normal plantars bilaterally Test coordination: Normal finger nose testing, with no evidence of limb appendicular ataxia or abnormal involuntary movements or tremors noted.  Gait: Deferred   Lab Results: Basic  Metabolic Panel:  Recent Labs Lab 10/13/15 1417 10/19/15 1254 10/20/15 0150 10/20/15 0526  NA 142 144  --  140  K 3.7 3.5  --  3.5  CL 106 109  --  106  CO2 27 27  --  27  GLUCOSE 91 101*  --  91  BUN 14 14  --  8  CREATININE 1.11 1.22  --  1.03  CALCIUM 9.1 9.1  --  9.0  MG  --   --  1.9  --     Liver Function Tests: No results for input(s): AST, ALT, ALKPHOS, BILITOT, PROT, ALBUMIN in the last 168 hours. No results for input(s): LIPASE, AMYLASE in the last 168 hours. No results for input(s): AMMONIA in the last 168 hours.  CBC:  Recent Labs Lab 10/13/15 1417 10/19/15 1254 10/20/15 0526  WBC 2.9* 4.3 4.8  NEUTROABS  --  3.1  --   HGB 12.5* 13.1 13.0  HCT 36.5* 41.2 40.5  MCV 95.5 98.8 97.6  PLT 112* 110* 121*    Cardiac Enzymes: No results for  input(s): CKTOTAL, CKMB, CKMBINDEX, TROPONINI in the last 168 hours.  Lipid Panel: No results for input(s): CHOL, TRIG, HDL, CHOLHDL, VLDL, LDLCALC in the last 168 hours.  CBG: No results for input(s): GLUCAP in the last 168 hours.  Microbiology: Results for orders placed or performed during the hospital encounter of 10/19/15  MRSA PCR Screening     Status: None   Collection Time: 10/20/15  4:00 AM  Result Value Ref Range Status   MRSA by PCR NEGATIVE NEGATIVE Final    Comment:        The GeneXpert MRSA Assay (FDA approved for NASAL specimens only), is one component of a comprehensive MRSA colonization surveillance program. It is not intended to diagnose MRSA infection nor to guide or monitor treatment for MRSA infections.     Imaging: Ct Head Wo Contrast  10/19/2015  CLINICAL DATA:  Several recent seizures EXAM: CT HEAD WITHOUT CONTRAST TECHNIQUE: Contiguous axial images were obtained from the base of the skull through the vertex without intravenous contrast. COMPARISON:  June 24, 2015 FINDINGS: The ventricles are normal in size and configuration. There is no intracranial mass, hemorrhage, extra-axial fluid collection, or midline shift. There is evidence of a prior infarct involving the medial mid to superior right frontal lobe. There is stable patchy periventricular small vessel disease in the centra semiovale. There is also a prior small lacunar type infarct in the anterior right thalamus. No acute infarct evident. The bony calvarium appears intact. The mastoid air cells are clear. No intraorbital lesions. There is nasal turbinate edema bilaterally. There is an old healed fracture of the right nasal bone. There is mild leftward deviation of the nasal septum. IMPRESSION: Prior right frontal lobe infarct. Patchy small vessel disease in the periventricular white matter is stable. Prior small infarct anterior right thalamus. No acute infarct. No hemorrhage or mass effect. Nasal  turbinate edema noted. Electronically Signed   By: Bretta Bang III M.D.   On: 10/19/2015 13:28   Dg Chest Port 1 View  10/19/2015  CLINICAL DATA:  Seizure this morning with low oxygen saturation EXAM: PORTABLE CHEST 1 VIEW COMPARISON:  None. FINDINGS: Mild enlargement of cardiac silhouette. The vascular pattern is within normal limits. Lungs are clear with no evidence of edema or effusion. The left costophrenic angle however is excluded from the field of view. IMPRESSION: Cardiac silhouette enlargement suggesting cardiomegaly or pericardial effusion. Electronically Signed  By: Esperanza Heir M.D.   On: 10/19/2015 15:09    Assessment and plan:   [name] Short is an 50 y.o. male patient who presented following a total of 6 episodes of convulsive seizures each lasting 3-4 minutes as described by his wife. Currently seizure free after increasing his dose of Keppra to 1 Gram BID.   Will be seen by Dr. Lavon Paganini. Please see his attestation note for A/P for any additional work up recommendations.

## 2015-10-20 NOTE — Evaluation (Signed)
Physical Therapy Evaluation Patient Details Name: [name] Short MRN: [id number] DOB: 10-Aug-1965 Today's Date: 10/20/2015   History of Present Illness  Pt is a 50 y/o M admitted following multiple witnessed seizures.  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    Clinical Impression  Pt admitted with above diagnosis. Pt currently with functional limitations due to the deficits listed below (see PT Problem List). [name] Short presents w/ impaired cognition, balance, and strength at baseline. Suggested purchasing tall sock to improve compliance w/ Lt AFO to prevent future falls.  Currently requires min guard>min assist for safe ambulation.  Pt will benefit from skilled PT to increase their independence and safety with mobility to allow discharge to the venue listed below.      Follow Up Recommendations Home health PT;Supervision for mobility/OOB    Equipment Recommendations  Other (comment) (tub bench (his is broken))    Recommendations for Other Services OT consult     Precautions / Restrictions Precautions Precautions: Fall Precaution Comments: Lt foot drop Required Braces or Orthoses: Other Brace/Splint Other Brace/Splint: pt has Lt AFO but it was at home  Restrictions Weight Bearing Restrictions: No      Mobility  Bed Mobility Overal bed mobility: Needs Assistance Bed Mobility: Supine to Sit;Sit to Supine     Supine to sit: Min guard Sit to supine: Min guard   General bed mobility comments: Increased time and cues as pt seems to forget what he was doing vs. rest break due to fatigue.  Use of bed rail.  Transfers Overall transfer level: Needs assistance Equipment used: Rolling walker (2 wheeled);1 person hand held assist Transfers: Sit to/from Stand Sit to Stand: Min assist         General transfer comment: Assist to steady w/ 1 person HHA and pt slow to stand x1 from bed.  Stability improved but min assist to steady w/ use of  RW.  Ambulation/Gait Ambulation/Gait assistance: Min guard;Min assist Ambulation Distance (Feet): 100 Feet Assistive device: Rolling walker (2 wheeled);1 person hand held assist Gait Pattern/deviations: Step-to pattern;Decreased stride length;Steppage;Antalgic;Decreased dorsiflexion - left   Gait velocity interpretation: Below normal speed for age/gender General Gait Details: Compensatory Lt steppage gait. Pt stumbles x1 when ambulating w/ 1 person HHA due to Lt foot drop. Once RW introduced pt requires close min guard assist.  Stairs            Wheelchair Mobility    Modified Rankin (Stroke Patients Only)       Balance Overall balance assessment: Needs assistance (denies h/o falls, except for recent seizures) Sitting-balance support: Bilateral upper extremity supported;Feet supported Sitting balance-Leahy Scale: Fair Sitting balance - Comments: Pt able to sit EOB ~8 minutes but occassionally leaning to Rt due to fatigue Postural control: Right lateral lean Standing balance support: Bilateral upper extremity supported;During functional activity Standing balance-Leahy Scale: Poor Standing balance comment: Relies on UE support                             Pertinent Vitals/Pain Pain Assessment: Faces Faces Pain Scale: Hurts little more Pain Location: Lt UE IV site Pain Descriptors / Indicators: Discomfort;Grimacing Pain Intervention(s): Limited activity within patient's tolerance;Monitored during session    Home Living Family/patient expects to be discharged to:: Private residence Living Arrangements: Other (Comment) (Friend) Available Help at Discharge: Friend(s);Available 24 hours/day Type of Home: Apartment Home Access: Level entry     Home Layout: One level  Home Equipment: Walker - 2 wheels;Cane - single point;Wheelchair - manual (tub bench that is broken)      Prior Function Level of Independence: Independent         Comments: Pt reports he  was ambulating w/o AD and does not need assist w/ any ADLs     Hand Dominance   Dominant Hand: Right    Extremity/Trunk Assessment   Upper Extremity Assessment: LUE deficits/detail       LUE Deficits / Details: not formally tested; Lt hemiparesis at baseline due to h/o CVA   Lower Extremity Assessment: LLE deficits/detail   LLE Deficits / Details: DF: 0/5, knee extension 3/5, knee flexion 2/5  Cervical / Trunk Assessment: Other exceptions  Communication   Communication: Other (comment) (dyarthria)  Cognition Arousal/Alertness: Awake/alert Behavior During Therapy: Flat affect Overall Cognitive Status: History of cognitive impairments - at baseline Area of Impairment: Memory;Safety/judgement;Problem solving     Memory: Decreased short-term memory   Safety/Judgement: Decreased awareness of safety;Decreased awareness of deficits   Problem Solving: Slow processing;Decreased initiation;Difficulty sequencing;Requires verbal cues;Requires tactile cues General Comments: Pt requires increased time to answer questions and for simple tasks    General Comments General comments (skin integrity, edema, etc.): Pt reports that he has not been using his Lt AFO because it rubs against his skin.  He denies using a tall sock so pt and pt's friend encouraged to purchase tall sock to serve as a barrier between brace and skin to allow him to use his AFO.  Pt and friend verbalized understanding.    Exercises        Assessment/Plan    PT Assessment Patient needs continued PT services  PT Diagnosis Difficulty walking;Hemiplegia non-dominant side;Abnormality of gait   PT Problem List Decreased strength;Decreased range of motion;Decreased activity tolerance;Decreased balance;Decreased mobility;Decreased cognition;Decreased knowledge of use of DME;Decreased safety awareness;Decreased knowledge of precautions;Impaired sensation  PT Treatment Interventions DME instruction;Gait training;Functional  mobility training;Therapeutic activities;Therapeutic exercise;Balance training;Neuromuscular re-education;Cognitive remediation;Patient/family education   PT Goals (Current goals can be found in the Care Plan section) Acute Rehab PT Goals Patient Stated Goal: none stated PT Goal Formulation: With patient/family Time For Goal Achievement: 11/03/15 Potential to Achieve Goals: Good    Frequency Min 3X/week   Barriers to discharge        Co-evaluation               End of Session Equipment Utilized During Treatment: Gait belt Activity Tolerance: Patient tolerated treatment well Patient left: in bed;with call bell/phone within reach;with family/visitor present (bed alarm not working; pt verbalized no OOB w/o assist) Nurse Communication: Mobility status    Functional Assessment Tool Used: Clinical Judgement Functional Limitation: Mobility: Walking and moving around Mobility: Walking and Moving Around Current Status [phone]([id number]): At least 20 percent but less than 40 percent impaired, limited or restricted Mobility: Walking and Moving Around Goal Status [phone](G8979): At least 1 percent but less than 20 percent impaired, limited or restricted    Time: 1120-1141 PT Time Calculation (min) (ACUTE ONLY): 21 min   Charges:   PT Evaluation $PT Eval Moderate Complexity: 1 Procedure     PT G Codes:   PT G-Codes **NOT FOR INPATIENT CLASS** Functional Assessment Tool Used: Clinical Judgement Functional Limitation: Mobility: Walking and moving around Mobility: Walking and Moving Around Current Status ([phone]): At least 20 percent but less than 40 percent impaired, limited or restricted Mobility: Walking and Moving Around Goal Status [phone](G8979): At least 1 percent but less than 20 percent impaired,  limited or restricted   Encarnacion Chu PT, DPT  Pager: [phone] Phone: [phone] 10/20/2015, 12:26 PM

## 2015-10-22 ENCOUNTER — Other Ambulatory Visit: Payer: Self-pay | Admitting: Family Medicine

## 2015-10-23 DIAGNOSIS — B20 Human immunodeficiency virus [HIV] disease: Secondary | ICD-10-CM | POA: Insufficient documentation

## 2015-10-23 DIAGNOSIS — Z21 Asymptomatic human immunodeficiency virus [HIV] infection status: Secondary | ICD-10-CM

## 2015-10-23 HISTORY — DX: Human immunodeficiency virus (HIV) disease: B20

## 2015-10-23 HISTORY — DX: Asymptomatic human immunodeficiency virus (hiv) infection status: Z21

## 2015-10-23 NOTE — Procedures (Signed)
History: [name] ColeCarl Short is an 50 y.o. male patient with  Seizures. Routine inpatient EEG was performed for further evaluation.   Patient Active Problem List   Diagnosis Date Noted  . Seizure (HCC) 10/19/2015  . Tobacco abuse   . Intertrigo 09/30/2015  . Tinea pedis 07/15/2015  . Onychomycosis of toenail 07/15/2015  . Localization-related symptomatic epilepsy and epileptic syndromes with complex partial seizures, not intractable, without status epilepticus (HCC) 07/14/2015  . History of stroke 07/14/2015  . Cerebral aneurysm, nonruptured 07/14/2015  . 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 Gait disturbance, post-stroke 01/19/2015  . Atrial fibrillation (HCC) 12/25/2014  . Hypertensive heart disease 12/15/2014  . Cardiomyopathy (HCC) 12/08/2014  . Insomnia 12/07/2014  . Essential hypertension 12/07/2014  . Secondary cardiomyopathy (HCC) 11/05/2014  . Malignant hypertension 11/05/2014  . Hyperlipidemia LDL goal <70 11/05/2014  . Cigarette smoker 11/05/2014  . Marijuana abuse 11/05/2014  . Hypokalemia 11/05/2014  . Left hemiparesis (HCC) 11/05/2014  . Cognitive deficit, post-stroke 11/05/2014  . Left-sided weakness   . Acute right anterior cerebral artery (ACA) ischemic stroke 10/31/2014    No current facility-administered medications for this encounter.  Current outpatient prescriptions:  .  amLODipine (NORVASC) 10 MG tablet, Take 1 tablet (10 mg total) by mouth daily., Disp: 30 tablet, Rfl: 5 .  apixaban (ELIQUIS) 5 MG TABS tablet, Take 1 tablet (5 mg total) by mouth 2 (two) times daily., Disp: 180 tablet, Rfl: 3 .  atorvastatin (LIPITOR) 40 MG tablet, Take 1 tablet (40 mg total) by mouth daily at 6 PM., Disp: 30 tablet, Rfl: 5 .  carvedilol (COREG) 25 MG tablet, Take 1 tablet (25 mg total) by mouth 2 (two) times daily with a meal., Disp: 60 tablet, Rfl: 11 .  clotrimazole-betamethasone (LOTRISONE) cream, Apply 1 application topically daily at 2 PM., Disp: 30 g, Rfl: 3 .   hydrochlorothiazide (HYDRODIURIL) 12.5 MG tablet, Take 1 tablet (12.5 mg total) by mouth daily., Disp: 30 tablet, Rfl: 2 .  lisinopril (PRINIVIL,ZESTRIL) 40 MG tablet, Take 1 tablet (40 mg total) by mouth daily., Disp: 30 tablet, Rfl: 5 .  potassium chloride SA (K-DUR,KLOR-CON) 20 MEQ tablet, Take 2 tablets (40 mEq total) by mouth daily., Disp: 180 tablet, Rfl: 3 .  ketoconazole (NIZORAL) 2 % cream, APPLY 1 APPLICATION TOPICALLY DAILY., Disp: 15 g, Rfl: 0 .  levETIRAcetam (KEPPRA) 1000 MG tablet, Take 1 tablet (1,000 mg total) by mouth 2 (two) times daily., Disp: 60 tablet, Rfl: 0   Introduction:  This is a 19 channel routine scalp EEG performed at the bedside with bipolar and monopolar montages arranged in accordance to the international 10/20 system of electrode placement. One channel was dedicated to EKG recording.   Findings:  The background rhythm was normal 9-10 Hz alpha . A few infrequent left temporal abnormal epileptiform discharges in the form of sharps with phase reversals were noted . No evidence of electrographic seizures were noted during this recording.   Impression:  Abnormal awake and drowsy routine inpatient EEG suggestive of underlying epileptogenic potential in the left temporal region as described. Otherwise, normal background activity is noted, with no electrographic seizures during this recording.  Clinical correlation is recommended .

## 2015-10-24 LAB — CULTURE, BLOOD (ROUTINE X 2): CULTURE: NO GROWTH

## 2015-10-25 LAB — CULTURE, BLOOD (ROUTINE X 2): Culture: NO GROWTH

## 2015-10-25 MED FILL — CARVEDILOL 25 MG TABLET: 25 | 30 days supply | Qty: 60 | Fill #7

## 2015-10-25 MED FILL — KETOCONAZOLE 2% CREAM: 2 | 15 days supply | Qty: 15 | Fill #0

## 2015-10-25 MED FILL — ATORVASTATIN 40 MG TABLET: 40 | 30 days supply | Qty: 30 | Fill #3

## 2015-10-26 ENCOUNTER — Ambulatory Visit: Payer: Medicaid Other | Attending: Family Medicine | Admitting: Family Medicine

## 2015-10-26 ENCOUNTER — Encounter: Payer: Self-pay | Admitting: Family Medicine

## 2015-10-26 VITALS — BP 128/84 | HR 58 | Temp 99.0°F | Resp 16 | Ht 70.0 in | Wt 174.0 lb

## 2015-10-26 DIAGNOSIS — Z7901 Long term (current) use of anticoagulants: Secondary | ICD-10-CM | POA: Insufficient documentation

## 2015-10-26 DIAGNOSIS — M79602 Pain in left arm: Secondary | ICD-10-CM

## 2015-10-26 DIAGNOSIS — L309 Dermatitis, unspecified: Secondary | ICD-10-CM | POA: Diagnosis not present

## 2015-10-26 DIAGNOSIS — Z79899 Other long term (current) drug therapy: Secondary | ICD-10-CM | POA: Insufficient documentation

## 2015-10-26 DIAGNOSIS — F17200 Nicotine dependence, unspecified, uncomplicated: Secondary | ICD-10-CM | POA: Diagnosis not present

## 2015-10-26 DIAGNOSIS — F329 Major depressive disorder, single episode, unspecified: Secondary | ICD-10-CM | POA: Diagnosis not present

## 2015-10-26 DIAGNOSIS — I10 Essential (primary) hypertension: Secondary | ICD-10-CM

## 2015-10-26 DIAGNOSIS — R569 Unspecified convulsions: Secondary | ICD-10-CM | POA: Insufficient documentation

## 2015-10-26 DIAGNOSIS — R21 Rash and other nonspecific skin eruption: Secondary | ICD-10-CM | POA: Diagnosis not present

## 2015-10-26 DIAGNOSIS — G40209 Localization-related (focal) (partial) symptomatic epilepsy and epileptic syndromes with complex partial seizures, not intractable, without status epilepticus: Secondary | ICD-10-CM

## 2015-10-26 MED ORDER — CARRINGTON MOISTURE BARRIER EX CREA: TOPICAL_CREAM | CUTANEOUS | Status: DC | PRN

## 2015-10-26 MED ORDER — DICLOFENAC SODIUM 1 % TD GEL: 2.0000 g | Freq: Four times a day (QID) | TRANSDERMAL | Status: DC

## 2015-10-26 MED ORDER — HYDROCHLOROTHIAZIDE 12.5 MG PO TABS: 12.5000 mg | ORAL_TABLET | Freq: Every day | ORAL | Status: DC

## 2015-10-26 MED ORDER — LIDOCAINE 5 % EX OINT: 1.0000 "application " | TOPICAL_OINTMENT | CUTANEOUS | Status: DC | PRN

## 2015-10-26 MED FILL — levETIRAcetam 1000 MG TABS: 1000 | 30 days supply | Qty: 60 | Fill #0

## 2015-10-26 MED FILL — LIDOCAINE 5% OINTMENT: 5 | 15 days supply | Qty: 30 | Fill #0

## 2015-10-26 NOTE — Patient Instructions (Addendum)
Baldo AshCarl was seen today for seizures and hospitalization follow-up.  Diagnoses and all orders for this visit:  Left arm pain -     lidocaine (XYLOCAINE) 5 % ointment; Apply 1 application topically as needed. -     diclofenac sodium (VOLTAREN) 1 % GEL; Apply 2 g topically 4 (four) times daily.  Dermatitis -     Skin Protectants, Misc. (EUCERIN) cream; Apply topically as needed for wound care.  Essential hypertension -     hydrochlorothiazide (HYDRODIURIL) 12.5 MG tablet; Take 1 tablet (12.5 mg total) by mouth daily.   [facility]. [facility].  F/u in 3 months for hypertension, sooner if needed   Dr. Armen PickupFunches

## 2015-10-26 NOTE — Progress Notes (Signed)
HFU Sz three days ago  Injury on lt arm no Fx  Pain scale # 10 No tobacco user  No suicidal thoughts in the past to weeks

## 2015-10-26 NOTE — Progress Notes (Signed)
Subjective:  Patient ID: Prashant Glosser, male    DOB: [birth year]  Age: 50 y.o. MRN: [id number]  CC: No chief complaint on file.   HPI Jaziah Goeller presents for    1. HFU seizure: he had seizure with fall at home. He was down on the floor for 2 hours. He was hospitalized from 3/28-3/29/17. His keppra dose was increased during socialization. He has been seizure-free since being home. He denies excessive fatigue.  2. Left arm pain: His persistent left arm pain since his fall. No swelling or skin discoloration. He is not taking any pain medicine. He is able take tramadol due to his history of seizures. His been advised to avoid Tylenol.  3. Depressed mood: He is interested in therapy for his depressed mood. He declines antidepressant. He has been depressed since his stroke.  4. Skin rash: He reports no improvement in his gluteal skin rash since using topical Lotrisone cream. He is a chronic rash for the past year.  Social History  Substance Use Topics  . Smoking status: Current Every Day Smoker -- 34 years    Types: Cigars  . Smokeless tobacco: Never Used     Comment: 10/19/2015 "stopped smoking cigarettes in 2005; started back in 2013; started smoking Black N Milds in 2005"  . Alcohol Use: 0.0 oz/week    0 Standard drinks or equivalent per week     Comment: 10/19/2015 Alcohol use stopped in April 2016; "never drank alot when I did drink"    Outpatient Prescriptions Prior to Visit  Medication Sig Dispense Refill  . amLODipine (NORVASC) 10 MG tablet Take 1 tablet (10 mg total) by mouth daily. 30 tablet 5  . apixaban (ELIQUIS) 5 MG TABS tablet Take 1 tablet (5 mg total) by mouth 2 (two) times daily. 180 tablet 3  . atorvastatin (LIPITOR) 40 MG tablet Take 1 tablet (40 mg total) by mouth daily at 6 PM. 30 tablet 5  . carvedilol (COREG) 25 MG tablet Take 1 tablet (25 mg total) by mouth 2 (two) times daily with a meal. 60 tablet 11  . clotrimazole-betamethasone (LOTRISONE) cream Apply 1  application topically daily at 2 PM. 30 g 3  . hydrochlorothiazide (HYDRODIURIL) 12.5 MG tablet Take 1 tablet (12.5 mg total) by mouth daily. 30 tablet 2  . ketoconazole (NIZORAL) 2 % cream APPLY 1 APPLICATION TOPICALLY DAILY. 15 g 0  . levETIRAcetam (KEPPRA) 1000 MG tablet Take 1 tablet (1,000 mg total) by mouth 2 (two) times daily. 60 tablet 0  . lisinopril (PRINIVIL,ZESTRIL) 40 MG tablet Take 1 tablet (40 mg total) by mouth daily. 30 tablet 5  . potassium chloride SA (K-DUR,KLOR-CON) 20 MEQ tablet Take 2 tablets (40 mEq total) by mouth daily. 180 tablet 3   No facility-administered medications prior to visit.    ROS Review of Systems  Constitutional: Negative for fever, chills, fatigue and unexpected weight change.  Eyes: Negative for visual disturbance.  Respiratory: Negative for cough and shortness of breath.   Cardiovascular: Negative for chest pain, palpitations and leg swelling.  Gastrointestinal: Negative for nausea, vomiting, abdominal pain, diarrhea, constipation and blood in stool.  Endocrine: Negative for polydipsia, polyphagia and polyuria.  Musculoskeletal: Positive for myalgias (l arm ). Negative for back pain, arthralgias, gait problem and neck pain.  Skin: Positive for rash.  Allergic/Immunologic: Negative for immunocompromised state.  Hematological: Negative for adenopathy. Does not bruise/bleed easily.  Psychiatric/Behavioral: Negative for suicidal ideas, sleep disturbance and dysphoric mood. The patient is not nervous/anxious.  Objective:  BP 128/84 mmHg  Pulse 58  Temp(Src) 99 F (37.2 C) (Oral)  Resp 16  Ht 5\' 10"  (1.778 m)  Wt 174 lb (78.926 kg)  BMI 24.97 kg/m2  SpO2 100%  BP/Weight 10/26/2015 10/20/2015 10/13/2015  Systolic BP 128 142 110  Diastolic BP 84 90 80  Wt. (Lbs) 174 174.6 179  BMI 24.97 25.05 25.68   Physical Exam  Constitutional: He appears well-developed and well-nourished. No distress.  HENT:  Head: Normocephalic and atraumatic.    edentulous   Neck: Normal range of motion. Neck supple.  Cardiovascular: Normal rate, regular rhythm, normal heart sounds and intact distal pulses.   Pulmonary/Chest: Effort normal and breath sounds normal.  Musculoskeletal: He exhibits no edema.       Left shoulder: He exhibits tenderness and pain. He exhibits normal range of motion, no bony tenderness, no swelling, no effusion, no crepitus, no deformity, no laceration, no spasm, normal pulse and normal strength.       Left elbow: Normal. He exhibits normal range of motion, no swelling, no effusion, no deformity and no laceration. No tenderness found.       Left forearm: He exhibits tenderness. He exhibits no bony tenderness, no swelling, no edema, no deformity and no laceration.  Neurological: He is alert.  Skin: Skin is warm and dry. No rash noted. No erythema.  Psychiatric: He has a normal mood and affect.     Assessment & Plan:   There are no diagnoses linked to this encounter.  No orders of the defined types were placed in this encounter.    Follow-up: No Follow-up on file.   Dessa PhiJosalyn Marybell Robards MD

## 2015-10-27 ENCOUNTER — Ambulatory Visit (INDEPENDENT_AMBULATORY_CARE_PROVIDER_SITE_OTHER): Payer: Self-pay | Admitting: Podiatry

## 2015-10-27 ENCOUNTER — Encounter: Payer: Self-pay | Admitting: Podiatry

## 2015-10-27 DIAGNOSIS — M79674 Pain in right toe(s): Secondary | ICD-10-CM

## 2015-10-27 DIAGNOSIS — M79602 Pain in left arm: Secondary | ICD-10-CM | POA: Insufficient documentation

## 2015-10-27 DIAGNOSIS — B351 Tinea unguium: Secondary | ICD-10-CM

## 2015-10-27 DIAGNOSIS — M79676 Pain in unspecified toe(s): Secondary | ICD-10-CM

## 2015-10-27 MED FILL — HYDROCHLOROTHIAZIDE 12.5 MG: 12.5 | 30 days supply | Qty: 30 | Fill #0

## 2015-10-27 NOTE — Assessment & Plan Note (Signed)
Chronic gluteal dermatitis without improvement using Lotrisone.  Plan for topical barrier cream, Eucerin ordered.

## 2015-10-27 NOTE — Assessment & Plan Note (Signed)
Muscle spelled: Left arm pain following fall. Topical pain relief gel.

## 2015-10-27 NOTE — Assessment & Plan Note (Signed)
He remains seizure-free since his Keppra dose has been increased.

## 2015-10-27 NOTE — Progress Notes (Signed)
Patient ID: [name] ColeCarl Posey, male   DOB: Dec 15, 1965, 50 y.o.   MRN: [id number] Complaint:  Visit Type: Patient returns to my office for continued preventative foot care services. Complaint: Patient states" my nails have grown long and thick and become painful to walk and wear shoes" Patient has been diagnosed with CVA . The patient presents for preventative foot care services. No changes to ROS  Podiatric Exam: Vascular: dorsalis pedis and posterior tibial pulses are not palpable bilateral. Capillary return is immediate. Temperature gradient is WNL. Sensorium: Normal Semmes Weinstein monofilament test. Normal tactile sensation bilaterally. Nail Exam: Pt has thick disfigured discolored nails with subungual debris noted bilateral entire nail hallux through fifth toenails Ulcer Exam: There is no evidence of ulcer or pre-ulcerative changes or infection. Orthopedic Exam: Muscle tone and strength are WNL. No limitations in general ROM. No crepitus or effusions noted. Foot type and digits show no abnormalities. Bony prominences are unremarkable. Skin: No Porokeratosis. No infection or ulcers.  Dry peeling skin.  Diagnosis:  Onychomycosis, , Pain in right toe, pain in left toes  Treatment & Plan Procedures and Treatment: Consent by patient was obtained for treatment procedures. The patient understood the discussion of treatment and procedures well. All questions were answered thoroughly reviewed. Debridement of mycotic and hypertrophic toenails, 1 through 5 bilateral and clearing of subungual debris. No ulceration, no infection noted.  Return Visit-Office Procedure: Patient instructed to return to the office for a follow up visit 3 months for continued evaluation and treatment.    Helane GuntherGregory Keisi Eckford DPM

## 2015-10-29 ENCOUNTER — Telehealth: Payer: Self-pay | Admitting: Family Medicine

## 2015-10-29 DIAGNOSIS — D61818 Other pancytopenia: Secondary | ICD-10-CM | POA: Insufficient documentation

## 2015-10-29 NOTE — Telephone Encounter (Signed)
Please call patient, I received a message from Dr. Doroteo BradfordKirstein regarding his labs He has low Hgb, WBC, platelets  Please come in for f/u labs: to follow up pancytopenia at earliest convenience, future orders placed

## 2015-10-29 NOTE — Telephone Encounter (Signed)
-----   Message from Sharin GravePamela J Fleming, RN sent at 10/15/2015 12:14 PM EDT ----- Reviewed results with pt while girlfriend was listening on speaker phone.  Advised of abnormalities in CBC and that the results will be sent to his PCP for further f/u.  They state understanding.

## 2015-11-04 ENCOUNTER — Telehealth: Payer: Self-pay | Admitting: Family Medicine

## 2015-11-04 DIAGNOSIS — L309 Dermatitis, unspecified: Secondary | ICD-10-CM

## 2015-11-04 NOTE — Telephone Encounter (Signed)
Asher MuirJamie, can you send additional resources? I sent  [facility], [facility] and [facility].

## 2015-11-04 NOTE — Telephone Encounter (Signed)
Patient called stating that he was given resources for mental health. However they have not been successful. Patient would like other resources for mental health. Please follow up.

## 2015-11-08 ENCOUNTER — Telehealth: Payer: Self-pay | Admitting: Clinical

## 2015-11-08 MED FILL — POTASSIUM CL ER 20 MEQ TAB: 20 | 30 days supply | Qty: 60 | Fill #4

## 2015-11-08 MED FILL — AMLODIPINE BESYLATE 10 MG T: 10 | 30 days supply | Qty: 30 | Fill #1

## 2015-11-08 MED FILL — LISINOPRIL 40 MG TABLET: 40 | 30 days supply | Qty: 30 | Fill #1

## 2015-11-08 MED FILL — traZODone HCL 50 MG TABS: 50 | 30 days supply | Qty: 30 | Fill #6

## 2015-11-08 NOTE — Telephone Encounter (Signed)
Attempt to f/u with pt; left HIPPA-compliant message to return call to Jamie at [facility] at [phone] 

## 2015-11-08 NOTE — Telephone Encounter (Signed)
Pt. Called requesting a Rx for ketoconazole cream. Please f/u with pt.

## 2015-11-08 NOTE — Telephone Encounter (Signed)
Mr. [name] Short says that "someone from [facility]" is coming for a home visit tomorrow, 11-09-15, because he is unable to get to anyone easily. He is uncertain who is coming, or exactly what time they will be at his home. It is recommended that he call Asher MuirJamie at [facility] at [phone], before 5pm on 11-09-15, to let [facility] know whether or not he has been seen by someone from [facility] (and/or another agency) and the outcome of that visit, so that his PCP may be informed that he is receiving psychiatric care. Mr. [name] Short says he will call [phone], and is aware that if Asher MuirJamie does not hear from him by 5pm, she will call him; pt expresses agreement with that plan.

## 2015-11-09 ENCOUNTER — Telehealth: Payer: Self-pay | Admitting: Clinical

## 2015-11-09 NOTE — Telephone Encounter (Signed)
Pt reports that nobody from [facility] came to see him this morning, as he expected. He also says that he has not attempted to go to [facility] or [facility], but that he "just talked to them on the phone", and that he is unable to get to either location because of lack of transportation. He says he did not know he could apply for Medicaid transportation, and that "someone from social services" called him yesterday, but he does not know the name of the person who called, nor why they called. When asked if it was someone from social services who was supposed to see him today, rather than someone from [facility], he says he doesn't know. When asked if lack of transportation was the only barrier from him going to see a psychiatrist, he says yes, that sometimes he has transportation, but his girlfriend does not leave work until [employer]pm. When asked if he would like to have the list of [facility] providers, as well as the Medicaid transportation number, mailed to him for convenience, he says he would like that. Pt is aware that he may call or make an appointment to talk to or see [facility] prior to the end of April.   Mr. [name] Short is requesting that his doctor, Dr. Armen PickupFunches, call him, although he does not state the reason.

## 2015-11-11 MED FILL — KETOCONAZOLE 2% CREAM: 2 | 15 days supply | Qty: 15 | Fill #1

## 2015-11-12 MED ORDER — KETOCONAZOLE 2 % EX CREA: 1.0000 "application " | TOPICAL_CREAM | Freq: Two times a day (BID) | CUTANEOUS | Status: DC | PRN

## 2015-11-12 MED FILL — ELIQUIS 5 MG TABLET: 5 | 30 days supply | Qty: 60 | Fill #1

## 2015-11-12 NOTE — Telephone Encounter (Signed)
Pt notified Will come for labs at his earliest convenience for lab work  (Pt was advised to ask for me Francena HanlyStella)

## 2015-11-12 NOTE — Telephone Encounter (Signed)
Cream refilled

## 2015-11-15 MED FILL — KETOCONAZOLE 2% CREAM: 2 | 25 days supply | Qty: 60 | Fill #0

## 2015-11-16 NOTE — Telephone Encounter (Signed)
Pt [facility]  Pt stated will come tomorrow for lab work

## 2015-11-17 ENCOUNTER — Ambulatory Visit: Payer: Medicaid Other | Attending: Family Medicine

## 2015-11-17 ENCOUNTER — Ambulatory Visit (HOSPITAL_BASED_OUTPATIENT_CLINIC_OR_DEPARTMENT_OTHER): Payer: Medicaid Other | Admitting: Clinical

## 2015-11-17 DIAGNOSIS — R457 State of emotional shock and stress, unspecified: Secondary | ICD-10-CM | POA: Diagnosis not present

## 2015-11-17 DIAGNOSIS — D61818 Other pancytopenia: Secondary | ICD-10-CM

## 2015-11-17 NOTE — Progress Notes (Signed)
ASSESSMENT: Pt currently experiencing Emotional stress. Pt needs to f/u with PCP and would benefit from brief therapeutic interventions and community resources, regarding coping with emotional stress.  Stage of Change: contemplative  PLAN: 1. F/U with behavioral health consultant in as needed 2. Psychiatric Medications: none. 3. Behavioral recommendation(s):   -Call SCAT tomorrow, 11-18-15, to schedule a SCAT eligibility appointment at [phone] -Call [facility] Housing hotline at [phone]  SUBJECTIVE: Pt. referred by Dr Armen PickupFunches for patient request for counseling:  Pt. reports the following symptoms/concerns: Pt states that his primary concern is that his paid caregiver, in whose home he lives, makes him feel bad, calls him miserable since he has had a stroke, and is taking his money; pt says his caregiver is his primary transportation, and that he fears telling her that he wants to find his own place to live. Pt does not want caregiver with him at medical appointments as he does not want her "in my business". Pt denies any physical abuse by caregiver. Duration of problem: Less than one year Severity: severe  OBJECTIVE: Orientation & Cognition: Oriented x3. Thought processes normal and appropriate to situation. Mood: appropriate. Affect: appropriate Appearance: appropriate Risk of harm to self or others: no known risk of harm to self or others Substance use: none Assessments administered: PHQ2: 2  Diagnosis: Emotional stress  CPT Code: R45.7 -------------------------------------------- Other(s) present in the room: none  Time spent with patient in exam room: 30 minutes   Depression screen [facility] 2/9 11/17/2015 07/15/2015 04/23/2015 03/23/2015 03/15/2015  Decreased Interest 1 0 0 0 0  Down, Depressed, Hopeless 1 1 0 0 0  PHQ - 2 Score 2 1 0 0 0  Altered sleeping 0 3 - - -  Tired, decreased energy 0 0 - - -  Change in appetite 0 0 - - -  Feeling bad or failure  about yourself  0 3 - - -  Trouble concentrating 0 0 - - -  Moving slowly or fidgety/restless 0 3 - - -  Suicidal thoughts 0 0 - - -  PHQ-9 Score 2 10 - - -    GAD 7 : Generalized Anxiety Score 07/15/2015  Nervous, Anxious, on Edge 2  Control/stop worrying 3  Worry too much - different things 3  Trouble relaxing 0  Restless 1  Easily annoyed or irritable 0  Afraid - awful might happen 3  Total GAD 7 Score 12   *Pt did not want to answer beyond [facility] today 11-17-15

## 2015-11-18 LAB — IRON AND TIBC
%SAT: 14 % — ABNORMAL LOW (ref 15–60)
Iron: 28 ug/dL — ABNORMAL LOW (ref 50–180)
TIBC: 199 ug/dL — ABNORMAL LOW (ref 250–425)
UIBC: 171 ug/dL (ref 125–400)

## 2015-11-18 LAB — PATHOLOGIST SMEAR REVIEW

## 2015-11-18 LAB — FERRITIN: FERRITIN: 482 ng/mL — AB (ref 20–380)

## 2015-11-18 LAB — FOLATE: Folate: 10.1 ng/mL (ref >5.4)

## 2015-11-18 LAB — VITAMIN B12: VITAMIN B 12: 630 pg/mL (ref 200–1100)

## 2015-11-19 ENCOUNTER — Ambulatory Visit (HOSPITAL_BASED_OUTPATIENT_CLINIC_OR_DEPARTMENT_OTHER): Payer: Medicaid Other | Admitting: Physical Medicine & Rehabilitation

## 2015-11-19 ENCOUNTER — Encounter: Payer: Self-pay | Admitting: Physical Medicine & Rehabilitation

## 2015-11-19 ENCOUNTER — Encounter: Payer: Medicaid Other | Attending: Physical Medicine & Rehabilitation

## 2015-11-19 ENCOUNTER — Telehealth: Payer: Self-pay | Admitting: Infectious Disease

## 2015-11-19 VITALS — BP 102/54 | HR 71

## 2015-11-19 DIAGNOSIS — I10 Essential (primary) hypertension: Secondary | ICD-10-CM | POA: Insufficient documentation

## 2015-11-19 DIAGNOSIS — I69319 Unspecified symptoms and signs involving cognitive functions following cerebral infarction: Secondary | ICD-10-CM | POA: Diagnosis not present

## 2015-11-19 DIAGNOSIS — R269 Unspecified abnormalities of gait and mobility: Secondary | ICD-10-CM | POA: Diagnosis not present

## 2015-11-19 DIAGNOSIS — I700 Atherosclerosis of aorta: Secondary | ICD-10-CM | POA: Insufficient documentation

## 2015-11-19 DIAGNOSIS — F329 Major depressive disorder, single episode, unspecified: Secondary | ICD-10-CM | POA: Diagnosis not present

## 2015-11-19 DIAGNOSIS — G8194 Hemiplegia, unspecified affecting left nondominant side: Secondary | ICD-10-CM | POA: Diagnosis not present

## 2015-11-19 DIAGNOSIS — F17290 Nicotine dependence, other tobacco product, uncomplicated: Secondary | ICD-10-CM | POA: Diagnosis not present

## 2015-11-19 DIAGNOSIS — I69398 Other sequelae of cerebral infarction: Secondary | ICD-10-CM

## 2015-11-19 DIAGNOSIS — F4320 Adjustment disorder, unspecified: Secondary | ICD-10-CM | POA: Diagnosis not present

## 2015-11-19 DIAGNOSIS — R569 Unspecified convulsions: Secondary | ICD-10-CM | POA: Diagnosis not present

## 2015-11-19 LAB — HIV ANTIBODY (ROUTINE TESTING W REFLEX): HIV 1&2 Ab, 4th Generation: REACTIVE — AB

## 2015-11-19 LAB — HIV 1/2 CONFIRMATION
HIV 1 ANTIBODY: POSITIVE — AB
HIV 2 AB: NEGATIVE

## 2015-11-19 NOTE — Progress Notes (Signed)
Subjective:    Patient ID: [name] Short, male    DOB: 01-09-1966, 50 y.o.   MRN: [id number] 50 y.o.right handed  male with history of hypertension as well as remote tobacco abuse. Patient on no scheduled medications upon admission. Patient independent prior to admission living with his girlfriend working at [employer]. Presented 10/31/2014 with left-sided weakness and altered mental status. Noted blood pressure 220/110. Urine drug screen positive for marijuana. MRI of the brain showed acute ischemic right ACA territory infarct. 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 Irregularity of the A2 segment of the anterior cerebral bilaterally with abrupt cut off of flow at the A2 segment of the right ACA 1.5 cm above its origin consistent with patient's acute right ACA distribution infarct. Carotid Dopplers were no ICA stenosis. Echocardiogram with ejection fraction 35-40% with diffuse hypokinesis HPI Patient last seen by me approximate 6 months ago. In the interval time he has had onset of seizuresIn December 2016 with ED visit and then an admission for seizures as well as oxygen desaturation in March 2017. He follows up with neurology. He is ambulating without assisted device. He is independent with his mobility and his self-care. He still complains of left lower extreme weakness but does not like wearing his AFO. Pain Inventory Average Pain 0 Pain Right Now 0 My pain is no pain  In the last 24 hours, has pain interfered with the following? General activity 0 Relation with others 0 Enjoyment of life 0 What TIME of day is your pain at its worst? no pain Sleep (in general) NA  Pain is worse with: no pain Pain improves with: no pain Relief from Meds: no pain  Mobility walk without assistance  Function disabled: date disabled . I need assistance with the following:  meal  prep  Neuro/Psych No problems in this area  Prior Studies Any changes since last visit?  no  Physicians involved in your care Any changes since last visit?  no   Family History  Problem Relation Age of Onset  . Other      no premature cad  . Diabetes Mother   . Heart disease Father   . Heart attack Father   . Stroke Sister   . Hypertension Father   . Hypertension Brother   . Hypertension Sister    Social History   Social History  . Marital Status: Single    Spouse Name: N/A  . Number of Children: 3  . Years of Education: 12   Occupational History  . Unemployed    Social History Main Topics  . Smoking status: Current Every Day Smoker -- 34 years    Types: Cigars  . Smokeless tobacco: Never Used     Comment: 10/19/2015 "stopped smoking cigarettes in 2005; started back in 2013; started smoking Black N Milds in 2005"  . Alcohol Use: 0.0 oz/week    0 Standard drinks or equivalent per week     Comment: 10/19/2015 Alcohol use stopped in April 2016; "never drank alot when I did drink"  . Drug Use: Yes    Special: Marijuana     Comment: 10/19/2015 "none in the last 6 months"  . Sexual Activity: Not Currently   Other Topics Concern  . None   Social History Narrative   [place] in [place] his GF.  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  Right-handed.   Occasional caffeine use.   Past Surgical History  Procedure Laterality Date  . Tee without cardioversion N/A 11/03/2014    Procedure: TRANSESOPHAGEAL ECHOCARDIOGRAM (TEE);  Surgeon: Laurey Morale, MD;  Location: [facility] ENDOSCOPY;  Service: Cardiovascular;  Laterality: N/A;  . Multiple tooth extractions    . Nasal fracture surgery  1983  . Fracture surgery     Past Medical History  Diagnosis Date  . Hypertension     a. Previously on meds but none in several years.  . Smoker     a. Previous tobacco - quit ~ 2013. Ongoing daily marijuana usage. Occasional cigar.  . Cardiomyopathy (HCC)     a. 10/2014 Echo: EF  35-40%, diff HK, sev LVH, mod dil LA, PASP .;  b.  Echo 7/16: Severe LVH, EF 55-60%, aortic sclerosis without stenosis, mild AI, trivial MR, severe BAE  . Tobacco abuse   . Tuberculosis     "took RX for 109yr in the 1990s" (10/19/2015)  . Stroke [facility])     a. 10/2014 MRI Head: acute mod size nonhemorrhagic R ant cerebral artery distribution infarct involving the medial aspect of the right frontal and parietal lobe and right aspect of the corpus callosum w/ local mass effect-->TPA;  b. 10/2014 MRA: Abrupt cut off of the R ACA A2 segment;  b. 10/2014 Carotid U/S: 1-39% bilat ICA stenosis.  . Seizures (HCC) since 03/2015  . Depression    BP 102/54 mmHg  Pulse 71  SpO2 98%  Opioid Risk Score:   Fall Risk Score:  `1  Depression screen PHQ 2/9  Depression screen [facility] 2/9 11/19/2015 11/17/2015 07/15/2015 04/23/2015 03/23/2015 03/15/2015 02/18/2015  Decreased Interest 1 1 0 0 0 0 0  Down, Depressed, Hopeless 0 0 0 0  PHQ - 2 Score 0 0 0 0  Altered sleeping - 0 3 - - - -  Tired, decreased energy - 0 0 - - - -  Change in appetite - 0 0 - - - -  Feeling bad or failure about yourself  - 0 3 - - - -  Trouble concentrating - 0 0 - - - -  Moving slowly or fidgety/restless - 0 3 - - - -  Suicidal thoughts - 0 0 - - - -  PHQ-9 Score - 2 10 - - - -     Review of Systems  All other systems reviewed and are negative.      Objective:   Physical Exam  Constitutional: He is oriented to person, place, and time. He appears well-developed and well-nourished.  HENT:  Head: Normocephalic and atraumatic.  Eyes: Conjunctivae and EOM are normal. Pupils are equal, round, and reactive to light.  Neck: Normal range of motion.  Musculoskeletal:       Left shoulder: He exhibits normal range of motion, no tenderness and no pain.  Neurological: He is alert and oriented to person, place, and time.  4/5 in the left deltoid, biceps, triceps, grip 5/5 in the right deltoid, biceps, triceps, grip 5/5 in the  right hip flexor and extensor ankle dorsiflexor 4/5 in the left hip flexor and knee extensor and 2 minus ankle plantar flexor on the left side.0 ankle dorsiflexor on the left side  Psychiatric: He has a normal mood and affect.  Nursing note and vitals reviewed.  Gait without evidence of toe drag or knee instability.       Assessment & Plan:  1. Right ACA  infarct approximately one year post. As expected he has lower extremity greater than upper extremity weakness is lower extremity weakness is mainly distally. He would benefit from an AFO but does not feel it is comfortable. He is able to clear his foot in the clinic, He may fatigue with distance. Overall he's made excellent progress he was wheelchair bound for several months after the stroke. He has regained a modified independent level. As discussed with the patient and his fiance, I do not expect much further physical or cognitive improvement at this point. He will follow up with neurology for seizures. He obviously cannot drive because the seizures but even prior to the seizure I was not recommending driving because of his overall delayed responses related to his stroke. Do not think he'll ever be old to return to work

## 2015-11-19 NOTE — Telephone Encounter (Signed)
Hulda MarinJamie McMannes, Dr. Armen PickupFunches,   We in ID were alerted by an EPIC BPA that your patient's HIV test was positive by 4th gen assay for HIV-1  Can you reach out to him and let him know of his diagnosis?  We will also work on getting him into our clinic as well  (there is typically a back log of about 10 pts or so waiting for intake as a heads up)  I will cc [name] who does our HIV intakes.   It sounds like this man is suffering from significant emotional distress.   Our clinic will be closed on Monday but perhaps we can discuss further this coming Tuesday?

## 2015-11-19 NOTE — Patient Instructions (Signed)
Please follow-up with your primary care physician Please follow-up with neurology No driving

## 2015-11-22 ENCOUNTER — Telehealth: Payer: Self-pay | Admitting: Family Medicine

## 2015-11-22 DIAGNOSIS — Z21 Asymptomatic human immunodeficiency virus [HIV] infection status: Secondary | ICD-10-CM | POA: Insufficient documentation

## 2015-11-22 NOTE — Telephone Encounter (Signed)
Called pt. To schedule an appointment to see his PCP on 11/25/15. Pt was not available left VM to return call and schedule appointment.

## 2015-11-22 NOTE — Telephone Encounter (Signed)
Called patient Verified name and DOB.  Gave results that he is HIV positive. This is new information to him. He reports his last HIV test was in 2005 and presumably negative He denies hx of IV drug use.   His has one male partner of 6 years, Medical sales representativeherivan Self. He reports that he will inform his partner. Ms. Seymour BarsSelf does not have a doctor and will need HIV testing. I [facility].   He will follow up with me this Thursday, 11/25/2015 at 4:30 PM to discuss treatment timeline.  I have informed Mr. [name] Short that he would be contacted by the ID clinic to establish as a new patient.   He had no questions at this time and voiced understanding.

## 2015-11-23 NOTE — Telephone Encounter (Signed)
Clinic was closed on Monday. I am around and on call 24/7 this week for questions.   Rosalio Loudammy, Michelle should have this mans contact numbers which will be passed on to DIS office and he will be worked into clinic for an intake visit.  Thanks so much for making him aware of his diagnosis and for testing him in the first place

## 2015-11-23 NOTE — Telephone Encounter (Addendum)
Attempted to call Dr. Daiva EvesVan Dam to discuss patient further. I was informed that he was out of clinic today, but in tomorrow.  I am out of clinic tomorrow.  I have informed patient of his HIV + status and patient will see me in the office on Thursday 11/25/15.

## 2015-11-24 ENCOUNTER — Encounter: Payer: Self-pay | Admitting: Cardiology

## 2015-11-25 ENCOUNTER — Encounter: Payer: Self-pay | Admitting: Family Medicine

## 2015-11-25 ENCOUNTER — Ambulatory Visit: Payer: Medicaid Other | Attending: Family Medicine | Admitting: Family Medicine

## 2015-11-25 ENCOUNTER — Telehealth: Payer: Self-pay | Admitting: Neurology

## 2015-11-25 VITALS — BP 104/67 | HR 74 | Temp 99.8°F | Resp 16 | Ht 70.0 in | Wt 167.0 lb

## 2015-11-25 DIAGNOSIS — M545 Low back pain, unspecified: Secondary | ICD-10-CM

## 2015-11-25 DIAGNOSIS — F329 Major depressive disorder, single episode, unspecified: Secondary | ICD-10-CM | POA: Diagnosis present

## 2015-11-25 DIAGNOSIS — Z7901 Long term (current) use of anticoagulants: Secondary | ICD-10-CM | POA: Insufficient documentation

## 2015-11-25 DIAGNOSIS — Z21 Asymptomatic human immunodeficiency virus [HIV] infection status: Secondary | ICD-10-CM

## 2015-11-25 DIAGNOSIS — J302 Other seasonal allergic rhinitis: Secondary | ICD-10-CM | POA: Diagnosis not present

## 2015-11-25 DIAGNOSIS — G40209 Localization-related (focal) (partial) symptomatic epilepsy and epileptic syndromes with complex partial seizures, not intractable, without status epilepticus: Secondary | ICD-10-CM

## 2015-11-25 DIAGNOSIS — F0631 Mood disorder due to known physiological condition with depressive features: Secondary | ICD-10-CM

## 2015-11-25 DIAGNOSIS — B20 Human immunodeficiency virus [HIV] disease: Secondary | ICD-10-CM | POA: Insufficient documentation

## 2015-11-25 DIAGNOSIS — IMO0002 Reserved for concepts with insufficient information to code with codable children: Secondary | ICD-10-CM

## 2015-11-25 DIAGNOSIS — Z79899 Other long term (current) drug therapy: Secondary | ICD-10-CM | POA: Insufficient documentation

## 2015-11-25 DIAGNOSIS — F17290 Nicotine dependence, other tobacco product, uncomplicated: Secondary | ICD-10-CM | POA: Insufficient documentation

## 2015-11-25 DIAGNOSIS — I639 Cerebral infarction, unspecified: Secondary | ICD-10-CM

## 2015-11-25 MED ORDER — KETOROLAC TROMETHAMINE 30 MG/ML IJ SOLN
30.0000 mg | Freq: Once | INTRAMUSCULAR | Status: AC
  Administered 2015-11-25: 30 mg via INTRAMUSCULAR

## 2015-11-25 MED ORDER — DULOXETINE HCL 30 MG PO CPEP: 30.0000 mg | ORAL_CAPSULE | Freq: Every day | ORAL | Status: DC

## 2015-11-25 MED ORDER — LEVETIRACETAM 1000 MG PO TABS: 1000.0000 mg | ORAL_TABLET | Freq: Two times a day (BID) | ORAL | Status: AC

## 2015-11-25 MED ORDER — CETIRIZINE HCL 10 MG PO TABS: 10.0000 mg | ORAL_TABLET | Freq: Every day | ORAL | Status: DC

## 2015-11-25 MED FILL — levETIRAcetam 1000 MG TABS: 1000 | 30 days supply | Qty: 60 | Fill #0

## 2015-11-25 MED FILL — ATORVASTATIN 40 MG TABLET: 40 | 30 days supply | Qty: 30 | Fill #4

## 2015-11-25 MED FILL — CARVEDILOL 25 MG TABLET: 25 | 30 days supply | Qty: 60 | Fill #8

## 2015-11-25 NOTE — Patient Instructions (Addendum)
[name] Short was seen today for depression and hiv positive/aids.  Diagnoses and all orders for this visit:  HIV positive (HCC) -     Ambulatory referral to Infectious Disease  Localization-related symptomatic epilepsy and epileptic syndromes with complex partial seizures, not intractable, without status epilepticus (HCC) -     Ambulatory referral to Speech Therapy  Left-sided low back pain without sciatica -     DG Lumbar Spine 2-3 Views; Future -     ketorolac (TORADOL) 30 MG/ML injection 30 mg; Inject 1 mL (30 mg total) into the muscle once.  Seasonal allergies -     cetirizine (ZYRTEC) 10 MG tablet; Take 1 tablet (10 mg total) by mouth daily.  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   You will get a call from the infectious disease  clinic for intake  F/u in 4 weeks for depression  Dr. Armen PickupFunches

## 2015-11-25 NOTE — Telephone Encounter (Signed)
Returned call. Needed clarification on patients Keppra dose, our records show that patient was taking 500 mg bid. Patients girlfriend states that patient had a seizure after his last visit with us and he was put on Keppra 1000 mg bid. States that patient has been doing good on increased dose. Will send in new Rx to his pharmacy. Patient will see Dr. Karel JarvisAquino in October for his f/u.
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
## 2015-11-25 NOTE — Progress Notes (Signed)
F/U HIV  Back pain, pain worsen with walk No tobacco user  No pain today  No suicide thoughts in the past two weeks

## 2015-11-25 NOTE — Telephone Encounter (Signed)
[name] Short 2066-06-29 He needs a refill on his Keppra 1000mg  medication. He uses [facility]. His number is [phone]. Thank you

## 2015-11-26 MED FILL — ALL DAY ALLERGY 10 MG TAB: 10 | 30 days supply | Qty: 30 | Fill #0

## 2015-11-26 MED FILL — DULoxetine HCL 30 MG CPEP: 30 | 30 days supply | Qty: 30 | Fill #0

## 2015-11-29 NOTE — Telephone Encounter (Signed)
[name] Short from [facility] called requesting to speak to the nurse because she received an application and she needs additional information. Please f/u

## 2015-11-29 NOTE — Telephone Encounter (Signed)
LVM to return call.

## 2015-11-30 NOTE — Assessment & Plan Note (Signed)
Persistent depression Cymbalta Referral to psychology

## 2015-11-30 NOTE — Assessment & Plan Note (Signed)
HIV + ID referral placed

## 2015-12-03 MED FILL — traZODone HCL 50 MG TABS: 50 | 30 days supply | Qty: 30 | Fill #7

## 2015-12-09 MED FILL — HYDROCHLOROTHIAZIDE 12.5 MG: 12.5 | 30 days supply | Qty: 30 | Fill #1

## 2015-12-09 MED FILL — AMLODIPINE BESYLATE 10 MG T: 10 | 30 days supply | Qty: 30 | Fill #2

## 2015-12-09 MED FILL — POTASSIUM CL ER 20 MEQ TAB: 20 | 30 days supply | Qty: 60 | Fill #5

## 2015-12-17 MED FILL — LISINOPRIL 40 MG TABLET: 40 | 30 days supply | Qty: 30 | Fill #2

## 2015-12-17 MED FILL — ELIQUIS 5 MG TABLET: 5 | 30 days supply | Qty: 60 | Fill #2

## 2015-12-22 MED FILL — CARVEDILOL 25 MG TABLET: 25 | 30 days supply | Qty: 60 | Fill #9

## 2015-12-22 MED FILL — ALL DAY ALLERGY 10 MG TAB: 10 | 30 days supply | Qty: 30 | Fill #1

## 2015-12-22 MED FILL — DULoxetine HCL 30 MG CPEP: 30 | 30 days supply | Qty: 30 | Fill #1

## 2015-12-29 MED FILL — traZODone HCL 50 MG TABS: 50 | 30 days supply | Qty: 30 | Fill #8 | Status: TO

## 2015-12-29 MED FILL — levETIRAcetam 1000 MG TABS: 1000 | 30 days supply | Qty: 60 | Fill #1

## 2015-12-29 MED FILL — ATORVASTATIN 40 MG TABLET: 40 | 30 days supply | Qty: 30 | Fill #5

## 2015-12-30 ENCOUNTER — Ambulatory Visit: Payer: Medicaid Other | Attending: Family Medicine | Admitting: Family Medicine

## 2015-12-30 ENCOUNTER — Encounter: Payer: Self-pay | Admitting: Family Medicine

## 2015-12-30 VITALS — BP 92/67 | HR 77 | Temp 100.1°F | Resp 16 | Ht 70.0 in | Wt 176.0 lb

## 2015-12-30 DIAGNOSIS — Z21 Asymptomatic human immunodeficiency virus [HIV] infection status: Secondary | ICD-10-CM | POA: Insufficient documentation

## 2015-12-30 DIAGNOSIS — I10 Essential (primary) hypertension: Secondary | ICD-10-CM | POA: Insufficient documentation

## 2015-12-30 DIAGNOSIS — Z79899 Other long term (current) drug therapy: Secondary | ICD-10-CM | POA: Diagnosis not present

## 2015-12-30 DIAGNOSIS — I639 Cerebral infarction, unspecified: Secondary | ICD-10-CM | POA: Diagnosis not present

## 2015-12-30 DIAGNOSIS — Z7901 Long term (current) use of anticoagulants: Secondary | ICD-10-CM | POA: Insufficient documentation

## 2015-12-30 DIAGNOSIS — F329 Major depressive disorder, single episode, unspecified: Secondary | ICD-10-CM | POA: Diagnosis present

## 2015-12-30 DIAGNOSIS — Z87891 Personal history of nicotine dependence: Secondary | ICD-10-CM | POA: Diagnosis not present

## 2015-12-30 DIAGNOSIS — R509 Fever, unspecified: Secondary | ICD-10-CM

## 2015-12-30 DIAGNOSIS — F0631 Mood disorder due to known physiological condition with depressive features: Secondary | ICD-10-CM

## 2015-12-30 DIAGNOSIS — IMO0002 Reserved for concepts with insufficient information to code with codable children: Secondary | ICD-10-CM

## 2015-12-30 LAB — COMPLETE METABOLIC PANEL WITH GFR
ALT: 8 U/L — AB (ref 9–46)
AST: 17 U/L (ref 10–40)
Albumin: 3.7 g/dL (ref 3.6–5.1)
Alkaline Phosphatase: 68 U/L (ref 40–115)
BILIRUBIN TOTAL: 0.6 mg/dL (ref 0.2–1.2)
BUN: 14 mg/dL (ref 7–25)
CALCIUM: 8.5 mg/dL — AB (ref 8.6–10.3)
CO2: 26 mmol/L (ref 20–31)
CREATININE: 1.33 mg/dL (ref 0.60–1.35)
Chloride: 104 mmol/L (ref 98–110)
GFR, EST AFRICAN AMERICAN: 73 mL/min (ref >=60)
GFR, Est Non African American: 63 mL/min (ref >=60)
Glucose, Bld: 75 mg/dL (ref 65–99)
Potassium: 4.2 mmol/L (ref 3.5–5.3)
Sodium: 135 mmol/L (ref 135–146)
TOTAL PROTEIN: 7.9 g/dL (ref 6.1–8.1)

## 2015-12-30 LAB — POCT URINALYSIS DIPSTICK
Blood, UA: 30
GLUCOSE UA: NEGATIVE
Leukocytes, UA: NEGATIVE
Nitrite, UA: NEGATIVE
Protein, UA: 30
SPEC GRAV UA: 1.02
Urobilinogen, UA: 1
pH, UA: 5.5

## 2015-12-30 LAB — CBC WITH DIFFERENTIAL/PLATELET
BASOS PCT: 0 %
Basophils Absolute: 0 cells/uL (ref 0–200)
EOS ABS: 230 {cells}/uL (ref 15–500)
Eosinophils Relative: 10 %
HEMATOCRIT: 36 % — AB (ref 38.5–50.0)
HEMOGLOBIN: 11.8 g/dL — AB (ref 13.2–17.1)
LYMPHS ABS: 368 {cells}/uL — AB (ref 850–3900)
Lymphocytes Relative: 16 %
MCH: 29.7 pg (ref 27.0–33.0)
MCHC: 32.8 g/dL (ref 32.0–36.0)
MCV: 90.7 fL (ref 80.0–100.0)
MPV: 11 fL (ref 7.5–12.5)
Monocytes Absolute: 207 cells/uL (ref 200–950)
Monocytes Relative: 9 %
NEUTROS ABS: 1495 {cells}/uL — AB (ref 1500–7800)
Neutrophils Relative %: 65 %
Platelets: 122 10*3/uL — ABNORMAL LOW (ref 140–400)
RBC: 3.97 MIL/uL — ABNORMAL LOW (ref 4.20–5.80)
RDW: 13.1 % (ref 11.0–15.0)
WBC: 2.3 10*3/uL — ABNORMAL LOW (ref 3.8–10.8)

## 2015-12-30 NOTE — Patient Instructions (Addendum)
[name] Short was seen today for depression.  Diagnoses and all orders for this visit:  Elevated temperature -     POCT urinalysis dipstick -     DG Chest 2 View; Future -     CBC with Differential -     T-helper cells (CD4) count ([facility]) -     COMPLETE METABOLIC PANEL WITH GFR  Depression due to stroke (HCC)   Improved depression Continue cymbalta 30 mg daily Continue counseling services  Please complete ordered chest x-ray You will be contacted with lab results  Low BP- STOP taking hydrochlorothiazide for now   You may take tylenol [phone] every 8 hrs for fever which is temp > 101 F.   F/u in 2  weeks for depression, elevated temperature   Dr. Armen PickupFunches

## 2015-12-30 NOTE — Progress Notes (Signed)
F/U depression  Elevated Temp since yesterday  Pt stated medication working  Taking medication as prescribed  No tobacco user  No suicidal thoughts in the past two weeks

## 2015-12-30 NOTE — Progress Notes (Addendum)
Subjective:  Patient ID: [name] Short, male    DOB: Jan 14, 1966  Age: 50 y.o. MRN: [id number]  CC: Depression   HPI [name] Short is HIV + not yet on HAART, he has hx of CVA (10/2014) with L sided weakness and A fib, cardiomyopathy improved (EF 55-60% 01/2015)  he presents for    1. Depression f.u: improved. Taking Cymbalta 30 mg daily. Had first counseling service last week with plan to follow up next week. No SI. It still living in with current girlfriend but their relationship is strained. He plans to move out as soon as possible. He is hoping and working towards moving to [place]. He has not told his girlfriend of his HIV + status. He has told 1 close male friend. He has not yet done the intake with the ID clinic.   2. Elevated temperature: patient is HIV +, not on HAART. A  home health nurse took patient's temp and BP yesterday. Temp 100. BP 106/67 yesterday. No cough, CP, SOB, sweats, chills, no dysuria. There is rash on the legs that has been present for a few years. Skin darkening and dryness.   Social History  Substance Use Topics  . Smoking status: Former Smoker -- 34 years    Types: Cigars  . Smokeless tobacco: Never Used     Comment: 10/19/2015 "stopped smoking cigarettes in 2005; started back in 2013; started smoking Black N Milds in 2005"  . Alcohol Use: 0.0 oz/week    0 Standard drinks or equivalent per week     Comment: 10/19/2015 Alcohol use stopped in April 2016; "never drank alot when I did drink"    Outpatient Prescriptions Prior to Visit  Medication Sig Dispense Refill  . amLODipine (NORVASC) 10 MG tablet Take 1 tablet (10 mg total) by mouth daily. 30 tablet 5  . apixaban (ELIQUIS) 5 MG TABS tablet Take 1 tablet (5 mg total) by mouth 2 (two) times daily. 180 tablet 3  . atorvastatin (LIPITOR) 40 MG tablet Take 1 tablet (40 mg total) by mouth daily at 6 PM. 30 tablet 5  . carvedilol (COREG) 25 MG tablet Take 1 tablet (25 mg total) by mouth 2 (two) times daily with  a meal. 60 tablet 11  . cetirizine (ZYRTEC) 10 MG tablet Take 1 tablet (10 mg total) by mouth daily. 30 tablet 2  . diclofenac sodium (VOLTAREN) 1 % GEL Apply 2 g topically 4 (four) times daily. 100 g 3  . DULoxetine (CYMBALTA) 30 MG capsule Take 1 capsule (30 mg total) by mouth daily. 30 capsule 2  . hydrochlorothiazide (HYDRODIURIL) 12.5 MG tablet Take 1 tablet (12.5 mg total) by mouth daily. 30 tablet 2  . ketoconazole (NIZORAL) 2 % cream Apply 1 application topically 2 (two) times daily as needed for irritation. 60 g 5  . levETIRAcetam (KEPPRA) 1000 MG tablet Take 1 tablet (1,000 mg total) by mouth 2 (two) times daily. 60 tablet 5  . lidocaine (XYLOCAINE) 5 % ointment Apply 1 application topically as needed. 35.44 g 0  . lisinopril (PRINIVIL,ZESTRIL) 40 MG tablet Take 1 tablet (40 mg total) by mouth daily. 30 tablet 5  . potassium chloride SA (K-DUR,KLOR-CON) 20 MEQ tablet Take 2 tablets (40 mEq total) by mouth daily. 180 tablet 3  . Skin Protectants, Misc. (EUCERIN) cream Apply topically as needed for wound care. 397 g 0   No facility-administered medications prior to visit.    ROS Review of Systems  Constitutional: Positive for unexpected weight change. Negative  for fever, chills and fatigue.  Eyes: Negative for visual disturbance.  Respiratory: Negative for cough and shortness of breath.   Cardiovascular: Negative for chest pain, palpitations and leg swelling.  Gastrointestinal: Negative for nausea, vomiting, abdominal pain, diarrhea, constipation and blood in stool.  Endocrine: Negative for polydipsia, polyphagia and polyuria.  Musculoskeletal: Positive for back pain. Negative for arthralgias, gait problem and neck pain.  Skin: Positive for rash (on legs x 10+ years).  Allergic/Immunologic: Negative for immunocompromised state.  Neurological: Positive for speech difficulty (since stroke ).  Hematological: Negative for adenopathy. Does not bruise/bleed easily.    Psychiatric/Behavioral: Positive for dysphoric mood. Negative for suicidal ideas and sleep disturbance. The patient is not nervous/anxious.     Objective:  BP 92/67 mmHg  Pulse 77  Temp(Src) 100.1 F (37.8 C) (Oral)  Resp 16  Ht  (1.778 m)  Wt 176 lb (79.833 kg)  BMI 25.25 kg/m2  SpO2 100%  BP/Weight 12/30/2015 11/25/2015 11/19/2015  Systolic BP 92 104 102  Diastolic BP 67 67 54  Wt. (Lbs) 176 167 -  BMI 25.25 23.96 -   Temp Readings from Last 3 Encounters:  12/30/15 100.1 F (37.8 C) Oral  11/25/15 99.8 F (37.7 C) Oral  10/26/15 99 F (37.2 C) Oral   BP Readings from Last 3 Encounters:  12/30/15 92/67  11/25/15 104/67  11/19/15 102/54   Wt Readings from Last 3 Encounters:  12/30/15 176 lb (79.833 kg)  11/25/15 167 lb (75.751 kg)  10/26/15 174 lb (78.926 kg)    Physical Exam  Constitutional: He appears well-developed and well-nourished. No distress.  HENT:  Head: Normocephalic and atraumatic.  Mouth/Throat: Oropharynx is clear and moist and mucous membranes are normal.  edentulous   Neck: Normal range of motion. Neck supple.  Cardiovascular: Normal rate, regular rhythm, normal heart sounds and intact distal pulses.  Exam reveals no friction rub.   No murmur heard. Pulmonary/Chest: Effort normal and breath sounds normal.  Abdominal: Soft. Bowel sounds are normal. He exhibits no distension and no mass. There is no tenderness. There is no rebound and no guarding.  Lymphadenopathy:    He has no cervical adenopathy.    He has no axillary adenopathy.       Right: No inguinal adenopathy present.       Left: No inguinal adenopathy present.  Neurological: He is alert.  Skin: Skin is warm and dry. No rash noted. No erythema.     Psychiatric: He has a normal mood and affect.   Depression screen [facility] 2/9 12/30/2015 11/25/2015 11/19/2015  Decreased Interest 0 1 1  Down, Depressed, Hopeless PHQ - 2 Score Altered sleeping 0 0 -  Tired, decreased energy 1  2 -  Change in appetite 0 3 -  Feeling bad or failure about yourself  1 2 -  Trouble concentrating 0 0 -  Moving slowly or fidgety/restless 1 3 -  Suicidal thoughts 0 0 -  PHQ-9 Score 4 13 -    GAD 7 : Generalized Anxiety Score 12/30/2015 11/25/2015 07/15/2015  Nervous, Anxious, on Edge 0 0 2  Control/stop worrying 1 0 3  Worry too much - different things Trouble relaxing 1 1 0  Restless 0 1 1  Easily annoyed or irritable 0 1 0  Afraid - awful might happen 0 1 3  Total GAD 7 Score Assessment & Plan:  There are no diagnoses linked to this encounter. Baldo AshCarl was seen today for depression.  Diagnoses and all orders for this visit:  Elevated temperature -     POCT urinalysis dipstick -     DG Chest 2 View; Future -     CBC with Differential -     Cancel: T-helper cells (CD4) count ([facility]) -     COMPLETE METABOLIC PANEL WITH GFR -     Cancel: T-helper cells (CD4) count (not at [facility]) -     T-helper cells (CD4) count ([facility]) -     Urine culture  Depression due to stroke (HCC)  HIV positive (HCC)  Essential hypertension   No orders of the defined types were placed in this encounter.    Follow-up: Return in about 2 weeks (around 01/13/2016) for elevated temp and depression .   Dessa PhiJosalyn Devera Englander MD

## 2015-12-31 ENCOUNTER — Telehealth: Payer: Self-pay | Admitting: Family Medicine

## 2015-12-31 DIAGNOSIS — R509 Fever, unspecified: Secondary | ICD-10-CM | POA: Insufficient documentation

## 2015-12-31 DIAGNOSIS — B20 Human immunodeficiency virus [HIV] disease: Secondary | ICD-10-CM

## 2015-12-31 LAB — T-HELPER CELLS (CD4) COUNT (NOT AT ARMC)
Absolute CD4: 8 cells/uL — ABNORMAL LOW (ref 381–1469)
CD4 T Helper %: 2 % — ABNORMAL LOW (ref 32–62)
Total lymphocyte count: 408 cells/uL — ABNORMAL LOW (ref 700–3300)

## 2015-12-31 NOTE — Assessment & Plan Note (Addendum)
HIV  Elevated temp, low BP No source of infection on exam except possibly urine, reviewed previous CXR there was cardiomegaly in 09/2015 Urine cx CXR CBC obtained and reviewed, there is leukopenia with neutropenia  ID called but unable to set up HIV intake visit plan to call back tomorrow

## 2015-12-31 NOTE — Telephone Encounter (Signed)
Called patient Verified name and DOB.  Gave lab results Low WBC, low  CD4 He technically has AIDs He reports that his Temp was 98.0 F today. He took aleve. No cough, CP, SOB, diarrhea, nausea. No sick contacts at home.   I informed him I spoke with ID and he will be called for his new patient intake appt to be done on 01/07/16.   Patient agreed with plan and voiced understanding.

## 2015-12-31 NOTE — Telephone Encounter (Signed)
Called ID to speak with physician on call, Dr. Johny SaxJeffrey Hatcher, regarding patient's elevated temp yesterday, decreased WBC and moderate neutropenia. Asked about CD4 count- ordered and pending UCx-pending CXR-pending  Blood Cx- not obtained.  Advised that no other testing or treatment needed acutely His greatest concern is the possibility of MAI Dr. Ninetta LightsHatcher is doing new HIV + patient intake next Friday on 01/07/16. He asked that I route my OV note to him and he will work the patient in on his scheduled for 01/07/16.    Called patient both of #s Left VM at both #s Called to check on how he is feeling today, give lab results and give update on care plan.  When patient calls back clinical to inform patient That he labs revealed lower WBC, he is at higher risk for infection. He should avoid all sick contacts. He should go immediately to ED if he develops cough, fever with temp > 100.4, SOB, chills.  Also I spoke to infectious disease, he will be contacted by the ID clinic  and scheduled for his intake appointment next Friday 01/07/16. It is very important that he confirms and keeps the appointment.

## 2015-12-31 NOTE — Assessment & Plan Note (Addendum)
Low normal BP today on 4 antihypertensives. Normal pulse.  BP has been low at last 3 OV, patient without dizziness, lightheadedness or somnolence to suggest shock.   Plan:  Hold HCTZ until follow up

## 2015-12-31 NOTE — Assessment & Plan Note (Signed)
Improved  Continue Cymbalta Continue counseling

## 2015-12-31 NOTE — Telephone Encounter (Signed)
Can we put him on the Friday clinic (6-16)? thanks

## 2015-12-31 NOTE — Telephone Encounter (Signed)
Pt. Returned call. Please f/u with pt. °

## 2016-01-01 LAB — URINE CULTURE
Colony Count: NO GROWTH
Organism ID, Bacteria: NO GROWTH

## 2016-01-03 ENCOUNTER — Ambulatory Visit: Payer: Medicaid Other | Admitting: Cardiology

## 2016-01-03 MED FILL — AMLODIPINE BESYLATE 10 MG T: 10 | 30 days supply | Qty: 30 | Fill #3 | Status: TO

## 2016-01-03 MED FILL — POTASSIUM CL ER 20 MEQ TAB: 20 | 30 days supply | Qty: 60 | Fill #6 | Status: TO

## 2016-01-03 MED FILL — HYDROCHLOROTHIAZIDE 12.5 MG: 12.5 | 30 days supply | Qty: 30 | Fill #2

## 2016-01-04 ENCOUNTER — Telehealth: Payer: Self-pay

## 2016-01-04 NOTE — Telephone Encounter (Signed)
Initially spoke with patient regarding referral to Infectious Disease.  He reported having no transportation. I asked him to call medicaid transportation and he was not familiar with this process.  Marcelino DusterMichelle, RN was asked to arrange transportation and call patient.  Once she called additional information was needed  and I tried to reach patient on his cell phone. On two attempts the phone was answered by a child who stated "call Papa back" and hung up phone.  I then called the home phone and spoke with Rosemarie AxSherivan who has documented permission for PHI to to discuss appointment information.  She is able to bring patient to his appointment.  Information given for appointment date and time.  Sherivan asked reason for referral and was told Dr Sidney AceFunchess requested evaluation.  I am not sure if she is aware of patient's diagnosis.  Laurell Josephsammy K King, RN

## 2016-01-04 NOTE — Telephone Encounter (Signed)
[place] [place] [facility] [place] [phone].  Medicaid case manager will contact patient within 48 hours to do an interview to determine need for transportation services. Depending approval/denial, he can utilize their transportation services for future appointments.  [facility] notified of appointment, will try to come by Friday to offer services. Andree CossHowell, Michelle M, RN

## 2016-01-07 ENCOUNTER — Ambulatory Visit (INDEPENDENT_AMBULATORY_CARE_PROVIDER_SITE_OTHER): Payer: Medicaid Other | Admitting: Infectious Diseases

## 2016-01-07 ENCOUNTER — Encounter: Payer: Self-pay | Admitting: Infectious Diseases

## 2016-01-07 ENCOUNTER — Ambulatory Visit
Admission: RE | Admit: 2016-01-07 | Discharge: 2016-01-07 | Disposition: A | Discharge status: Home / Self Care | Payer: Medicaid Other | Source: Ambulatory Visit | Attending: Family Medicine | Admitting: Family Medicine

## 2016-01-07 ENCOUNTER — Telehealth: Payer: Self-pay

## 2016-01-07 ENCOUNTER — Ambulatory Visit: Payer: Self-pay | Admitting: *Deleted

## 2016-01-07 VITALS — BP 110/75 | HR 71 | Temp 99.0°F | Ht 70.0 in | Wt 162.0 lb

## 2016-01-07 DIAGNOSIS — M545 Low back pain, unspecified: Secondary | ICD-10-CM

## 2016-01-07 DIAGNOSIS — B20 Human immunodeficiency virus [HIV] disease: Secondary | ICD-10-CM | POA: Diagnosis not present

## 2016-01-07 DIAGNOSIS — Z113 Encounter for screening for infections with a predominantly sexual mode of transmission: Secondary | ICD-10-CM

## 2016-01-07 DIAGNOSIS — Z720 Tobacco use: Secondary | ICD-10-CM | POA: Diagnosis not present

## 2016-01-07 DIAGNOSIS — I63521 Cerebral infarction due to unspecified occlusion or stenosis of right anterior cerebral artery: Secondary | ICD-10-CM

## 2016-01-07 DIAGNOSIS — Z79899 Other long term (current) drug therapy: Secondary | ICD-10-CM

## 2016-01-07 DIAGNOSIS — R509 Fever, unspecified: Secondary | ICD-10-CM

## 2016-01-07 MED ORDER — SULFAMETHOXAZOLE-TRIMETHOPRIM 400-80 MG PO TABS: 1.0000 | ORAL_TABLET | ORAL | Status: DC

## 2016-01-07 MED ORDER — ELVITEG-COBIC-EMTRICIT-TENOFAF 150-150-200-10 MG PO TABS: 1.0000 | ORAL_TABLET | Freq: Every day | ORAL | Status: AC

## 2016-01-07 MED ORDER — AZITHROMYCIN 600 MG PO TABS: 1200.0000 mg | ORAL_TABLET | ORAL | Status: DC

## 2016-01-07 NOTE — Assessment & Plan Note (Addendum)
Will get his baseline labs Will start him on genvoya, bactrim, azithro Will check his drug interactions with pharmacy- will ask his PCP to consider lower dose of eliquis and lipitor Offered/refused condoms. Will get him home help, Molli Poseyambre   Will see him back in 1 month
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
## 2016-01-07 NOTE — Assessment & Plan Note (Signed)
Appears to have quit

## 2016-01-07 NOTE — Progress Notes (Signed)
   Subjective:    Patient ID: [name] Short,[name] Short male    DOB: 25-Feb-1966, 50 y.o.   MRN: [id number]  HPI 50 yo M with HIV+ dx 10-2015.  He had test done for wt loss. He denies other illnesses.  He was in hospital for CVA 10-2014.  He also has a hx of seizures- last in march. No tobacco since then. No ETOH since his CVA.  He denies previous tests for this.   No results found for: HIV1RNAQUANT, HIV1RNAVL, CD4TABS  The past medical history, family history and social history were reviewed/updated in EPIC Living with daughter, has not disclosed except to a friend.   Review of Systems  Constitutional: Positive for unexpected weight change. Negative for appetite change.  HENT: Positive for dental problem. Negative for mouth sores.   Respiratory: Negative for cough and shortness of breath.   Gastrointestinal: Negative for diarrhea and constipation.  Genitourinary: Positive for genital sores. Negative for difficulty urinating.  Neurological: Negative for seizures.   Has had genital sores for 1 year. Not sure what they are from. Thinks they are called "trichomonas"    Objective:   Physical Exam  Constitutional: He appears well-developed and well-nourished.  HENT:  Mouth/Throat: No oropharyngeal exudate.  Eyes: EOM are normal. Pupils are equal, round, and reactive to light.  Neck: Neck supple.  Cardiovascular: Normal rate, regular rhythm and normal heart sounds.   Pulmonary/Chest: Effort normal and breath sounds normal.  Abdominal: Soft. Bowel sounds are normal. There is no tenderness. There is no rebound.  Musculoskeletal: He exhibits no edema.  Lymphadenopathy:    He has no cervical adenopathy.  Skin:          Assessment & Plan:

## 2016-01-10 ENCOUNTER — Telehealth: Payer: Self-pay | Admitting: *Deleted

## 2016-01-10 NOTE — Patient Instructions (Signed)
RN met with client and or caregiver for assessment. RN reviewed Agency Services, [place] Notice of Privacy Policy, Home Safety Management Information Booklet. Home Fire Safety Assessment, Fall Risk Assessment and Suicide Risk Assessment was performed. RN also discussed information on a Living Will, Advanced Directives, and Health Care Power of Attorney. RN and Client/Designated Party educated/reviewed/signed Client Agreement and Consent for Service form along with Patient Rights and Responsibilities statement. RN developed patient specific and centered care plan. RN provided contact information and reviewed how to receive emergency help after hours for schedule changes, billing questions, reporting of safety issues, falls, concerns or any needs/questions. Standard Precaution and Infection control along with interventions to correct or prevent high risk behaviors instructed to the patient. Client/Caregiver reports understanding and agreement with the above 

## 2016-01-10 NOTE — Telephone Encounter (Signed)
RN meet [name] Short for a face to face to offer services during his office visit with [name] Short. Patient consented to care at this time. During our meeting RN evaluated the patient's understanding of risky behavior and advised him of ways the HIV virus can be transmitted. Patient declined condoms at this time. [name] Short placed the patient on new medication but the patient does not want to share his HIV status with his family at this time. RN agreed to help the patient with getting his medications until a better plan is in place. [name] Short stated he has plans to move to [place] and living with his dtr is only temporary.   Order received on 01/07/2016 by [name] Short to evaluate [facility]).  Patient was evaluated on 01/07/2016 for [facility]NS.  Patient was consented to care at this time.    Frequency / Duration of CBHCN visits: Effective 01/07/2016: 662mo1, 333mo1, 252mo1, 601mo1, 3PRN's for complications with disease process/progression, medication changes or concerns  [facility]HCN will assess for learning needs related to diagnosis and treatment regimen, provide education as needed, fill pill box if needed, address any barriers which may be preventing medication compliance, and communicating with care team including physician and case manager.  Individualized Plan Of Care Certifcation Period of 01/07/2016-04/06/2016 a. Type of service(s) and care to be delivered: [facility] b. Frequency and duration of service: Effective 01/07/2016:172mo1, 513mo1, 332mo1, 171mo1, , 3 prns for complications with disease process/progression, medication changes or concerns c. Activity restrictions: Pt may be up as tolerated and can safely ambulate without the need for a assistive device d. Safety Measures: Standard Precautions/Infection Control e. Service Objectives and Goals: Service Objectives are to assist the pt with HIV medication regimen adherence and  staying in care with the Infectious Disease Clinic by identifying barriers to care. RN will address the barriers that are identified by the patient. Patient centered goal is to begin his medication but not let his family know his HIV status at this time f. Equipment required: No additional equipment needs at this time g. Functional Limitations: Vision. Pt has corrective glasses that he wears h. Rehabilitation potential: Guarded i. Diet and Nutritional Needs: Regular Diet j. Medications and treatments: Medications have been reconciled and reviewed and are a part of EPIC electronic file k. Specific therapies if needed: RN l. 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 Expected outcome: Guarded,

## 2016-01-10 NOTE — Progress Notes (Addendum)
RN meet [name] Short for a face to face to offer services during his office visit with Dr Hatcher. Patient consented to care at this time. During our meeting RN evaluated the patient's understanding of risky behavior and advised him of ways the HIV virus can be transmitted. Patient declined condoms at this time. Dr Hatcher placed the patient on new medication but the patient does not want to share his HIV status with his family at this time. RN agreed to help the patient with getting his medications until a better plan is in place. [name] Short stated he has plans to move to [place] and living with his dtr is only temporary.   Order received on 01/07/2016 by Dr. Hatcherl to evaluate patient for [facility] Services ([facility]).  Patient was evaluated on 01/07/2016 for [facility]NS.  Patient was consented to care at this time.    Frequency / Duration of CBHCN visits: Effective 01/07/2016: 2mo1, 3mo1, 2mo1, 1mo1, 3PRN's for complications with disease process/progression, medication changes or concerns  CBHCN will assess for learning needs related to diagnosis and treatment regimen, provide education as needed, fill pill box if needed, address any barriers which may be preventing medication compliance, and communicating with care team including physician and case manager.  Individualized Plan Of Care Certifcation Period of 01/07/2016-04/06/2016 a. Type of service(s) and care to be delivered: Community Based [facility] Care Nurse b. Frequency and duration of service: Effective 01/07/2016:2mo1, 3mo1, 2mo1, 1mo1, , 3 prns for complications with disease process/progression, medication changes or concerns c. Activity restrictions: Pt may be up as tolerated and can safely ambulate without the need for a assistive device d. Safety Measures: Standard Precautions/Infection Control e. Service Objectives and Goals: Service Objectives are to assist the pt with HIV medication regimen adherence and  staying in care with the Infectious Disease Clinic by identifying barriers to care. RN will address the barriers that are identified by the patient. Patient centered goal is to begin his medication but not let his family know his HIV status at this time f. Equipment required: No additional equipment needs at this time g. Functional Limitations: Vision. Pt has corrective glasses that he wears h. Rehabilitation potential: Guarded i. Diet and Nutritional Needs: Regular Diet j. Medications and treatments: Medications have been reconciled and reviewed and are a part of EPIC electronic file k. Specific therapies if needed: RN l. 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 Expected outcome: Guarded, 

## 2016-01-11 ENCOUNTER — Ambulatory Visit: Payer: Self-pay | Admitting: *Deleted

## 2016-01-11 ENCOUNTER — Telehealth: Payer: Self-pay | Admitting: *Deleted

## 2016-01-11 DIAGNOSIS — B20 Human immunodeficiency virus [HIV] disease: Secondary | ICD-10-CM

## 2016-01-11 DIAGNOSIS — I63521 Cerebral infarction due to unspecified occlusion or stenosis of right anterior cerebral artery: Secondary | ICD-10-CM

## 2016-01-11 DIAGNOSIS — Z8673 Personal history of transient ischemic attack (TIA), and cerebral infarction without residual deficits: Secondary | ICD-10-CM

## 2016-01-11 DIAGNOSIS — L309 Dermatitis, unspecified: Secondary | ICD-10-CM

## 2016-01-11 NOTE — Telephone Encounter (Signed)
-----   Message from Dessa PhiJosalyn Funches, MD sent at 01/07/2016  1:44 PM EDT ----- Normal CXR Except known enlargement of heart

## 2016-01-11 NOTE — Telephone Encounter (Signed)
-----   Message from Dessa PhiJosalyn Funches, MD sent at 01/07/2016  1:44 PM EDT ----- Normal low back x-ray

## 2016-01-11 NOTE — Telephone Encounter (Signed)
Date of birth verified by pt  Xray results given  Pt verbalized understanding   Requesting a call from PCP, no reason given  Pt has appointment on 01/27/2016

## 2016-01-13 ENCOUNTER — Ambulatory Visit: Payer: Medicaid Other | Admitting: Family Medicine

## 2016-01-13 MED ORDER — KETOCONAZOLE 2 % EX CREA: 1.0000 "application " | TOPICAL_CREAM | Freq: Two times a day (BID) | CUTANEOUS | Status: DC | PRN

## 2016-01-13 MED ORDER — CARRINGTON MOISTURE BARRIER EX CREA: TOPICAL_CREAM | CUTANEOUS | Status: AC | PRN

## 2016-01-13 MED ORDER — APIXABAN 2.5 MG PO TABS: 2.5000 mg | ORAL_TABLET | Freq: Two times a day (BID) | ORAL | Status: DC

## 2016-01-13 MED ORDER — ATORVASTATIN CALCIUM 20 MG PO TABS: 20.0000 mg | ORAL_TABLET | Freq: Every day | ORAL | Status: DC

## 2016-01-13 MED FILL — KETOCONAZOLE 2% CREAM: 2 | 15 days supply | Qty: 60 | Fill #0 | Status: TO

## 2016-01-13 MED FILL — ATORVASTATIN 20 MG TABLET: 20 | 30 days supply | Qty: 30 | Fill #0 | Status: TO

## 2016-01-13 MED FILL — ELIQUIS 2.5 MG TABLET: 2.5 | 30 days supply | Qty: 60 | Fill #0 | Status: TO

## 2016-01-13 NOTE — Progress Notes (Signed)
RN made a home visit with the patient. During today's visit we reviewed each of his medications and filled his weekly pill box together.  While filling his pill box RN explained to the patient his new medications along with the frequency again. We also reviewed his old regimen as well to be sure he understands the reason. While visiting the patient RN was able to meet with the patient's dtr, Jennifer(who he currently lives with) along with his oldest dtr that lives in [place]. RN explained my role without disclosing the patient's HIV status

## 2016-01-13 NOTE — Telephone Encounter (Signed)
Called patient verified name and DOB.  He reports he has started HIV treatment. He has moved out of his ex-girlfriends house. He now lives at the address listed in his chart. He has [facility] He was [facility] to [facility]  Phone [phone]

## 2016-01-14 ENCOUNTER — Telehealth: Payer: Self-pay

## 2016-01-14 NOTE — Telephone Encounter (Signed)
This Case Manager was asked by Dr. Armen PickupFunches to discuss transportation options with patient so he could get to medical appointments. Patient has hx of CVA with residual weakness and does not drive. Call placed to patient to discuss transportation options. Patient indicated in the past his daughter has driven him to his medical appointments. Patient indicated he has an in-person interview with [facility] next week. Patient indicated he has used [facility] a few times in the past and is hopeful he will be able to use [employer] for transportation to medical appointments. Encouraged patient to keep in-person interview with [facility] so he can determine if eligible for [employer] transportation services.   Also informed patient he may be eligible for Medicaid transportation services through [facility] [phone]). Provided patient with contact number to [facility] and informed him he will need to complete a transportation assessment over the phone to determine if eligible for Medicaid transportation services. Patient verbalized understanding and appreciative of information. Reminded patient of Cardiology appointment on 01/20/16 at 1115 with Dr. Anne FuSkains. Patient aware of appointment and appreciative of call. No additional needs/concerns identified.

## 2016-01-17 MED FILL — ALL DAY ALLERGY 10 MG TAB: 10 | 30 days supply | Qty: 30 | Fill #2

## 2016-01-17 MED FILL — LISINOPRIL 40 MG TABLET: 40 | 30 days supply | Qty: 30 | Fill #3 | Status: TO

## 2016-01-17 MED FILL — DULoxetine HCL 30 MG CPEP: 30 | 30 days supply | Qty: 30 | Fill #2

## 2016-01-17 MED FILL — CARVEDILOL 25 MG TABLET: 25 | 30 days supply | Qty: 60 | Fill #10 | Status: TO

## 2016-01-20 ENCOUNTER — Encounter: Payer: Self-pay | Admitting: Cardiology

## 2016-01-20 ENCOUNTER — Ambulatory Visit (INDEPENDENT_AMBULATORY_CARE_PROVIDER_SITE_OTHER): Payer: Medicaid Other | Admitting: Cardiology

## 2016-01-20 VITALS — BP 104/62 | HR 54 | Ht 70.0 in | Wt 171.6 lb

## 2016-01-20 DIAGNOSIS — Z8673 Personal history of transient ischemic attack (TIA), and cerebral infarction without residual deficits: Secondary | ICD-10-CM | POA: Diagnosis not present

## 2016-01-20 DIAGNOSIS — Z5181 Encounter for therapeutic drug level monitoring: Secondary | ICD-10-CM

## 2016-01-20 DIAGNOSIS — I429 Cardiomyopathy, unspecified: Secondary | ICD-10-CM

## 2016-01-20 DIAGNOSIS — I119 Hypertensive heart disease without heart failure: Secondary | ICD-10-CM

## 2016-01-20 DIAGNOSIS — I4891 Unspecified atrial fibrillation: Secondary | ICD-10-CM

## 2016-01-20 NOTE — Progress Notes (Signed)
Cardiology Office Note   Date:  01/20/2016   ID:  [name] ColeCarl Short, DOB Apr 01, 1966, MRN [id number]  PCP:  [name], [name], MD  Cardiologist:  Seen by Dr. Hillis RangeJames Short in the hospital >> now [name] SchultzMark Skains, MD      History of Present Illness: [name] Short is a 50 y.o. male with a hx of untreated HTN, marijuana use who was admitted 10/2014 with an acute R brain CVA.  He was treated with tPA.  He continued to have L sided weakness.  He had an echo that demonstrated EF 35-40%. After medical therapy, his ejection fraction is now 55-60% in July 2016.  TEE did not demonstrate embolic source.    Renal artery US was neg for RAS.  DCM was felt to be related to HTN heart disease because of his response to antihypertensives.      He returns for FU.   He is doing fairly well.  His speech remains impaired.  He feels he is improving and he has finished PT. he is excited because he is no longer needing to live in the group home. His blood pressure has been somewhat low however. He denies chest pain,dyspnea, orthopnea, PND, edema.  No syncope. Has had seizure activity.  Studies/Reports Reviewed Today:  Renal Art US 11/04/14 - No evidence of renal artery stenosis noted bilaterally. - Bilateral normal intrarenal resistive indices.  TEE 11/03/14 Findings: Please see echo section for full report. Normal left ventricular size with moderate LV hypertrophy. Diffuse hypokinesis with EF 35%. Normal RV size with mildly decreased systolic function. Trivial mitral regurgitation. Trivial tricuspid regurgitation. Trileaflet aortic valve with no stenosis and mild regurgitation. Mildly dilated left atrium, no LAA thrombus but there was smoke in the appendage. Normal caliber aorta with mild plaque. Negative bubble study, no ASD or PFO.  No definite source of embolus. EF 35% with moderate LVH and smoke but no thrombus in LA appendage  Echo 10/31/14 - Left ventricle: The cavity size was mildly dilated.  Wall thickness was increased in a pattern of severe LVH. Systolic function was moderately reduced. EF 35% to 40%. Diffuse hypokinesis. Doppler parameters are consistent with elevated ventricular end-diastolic filling pressure. - Left atrium: The atrium was moderately dilated. - Atrial septum: No defect or patent foramen ovale was identified. - Pulmonary arteries: PA peak pressure: 45 mm Hg (S). - Impressions: In the absence of HTN or renal failure cannot r/o amyloid.  Echocardiogram 02/01/15-ejection fraction 55-60%  Carotid US 11/01/14 Bilateral: 1-39% ICA stenosis. Vertebral artery flow is antegrade.   Past Medical History  Diagnosis Date  . Hypertension     a. Previously on meds but none in several years.  . Smoker     a. Previous tobacco - quit ~ 2013. Ongoing daily marijuana usage. Occasional cigar.  . Cardiomyopathy (HCC)     a. 10/2014 Echo: EF 35-40%, diff HK, sev LVH, mod dil LA, PASP 45mmHg.;  b.  Echo 7/16: Severe LVH, EF 55-60%, aortic sclerosis without stenosis, mild AI, trivial MR, severe BAE  . Tuberculosis     "took RX for 6638yr in the 1990s" (10/19/2015)  . Stroke [facility](HCC)     a. 10/2014 MRI Head: acute mod size nonhemorrhagic R ant cerebral artery distribution infarct involving the medial aspect of the right frontal and parietal lobe and right aspect of the corpus callosum w/ local mass effect-->TPA;  b. 10/2014 MRA: Abrupt cut off of the R ACA A2 segment;  b. 10/2014 Carotid U/S: 1-39% bilat ICA  stenosis.  . Seizures (HCC) since 03/2015  . Depression   . HIV infection [facility](HCC) April 2017    Past Surgical History  Procedure Laterality Date  . Tee without cardioversion N/A 11/03/2014    Procedure: TRANSESOPHAGEAL ECHOCARDIOGRAM (TEE);  Surgeon: Laurey Moralealton S McLean, MD;  [facility];  Service: Cardiovascular;  Laterality: N/A;  . Multiple tooth extractions    . Nasal fracture surgery  1983  . Fracture surgery       Current Outpatient Prescriptions  Medication Sig  Dispense Refill  . apixaban (ELIQUIS) 2.5 MG TABS tablet Take 1 tablet (2.5 mg total) by mouth 2 (two) times daily. 60 tablet 5  . atorvastatin (LIPITOR) 20 MG tablet Take 1 tablet (20 mg total) by mouth daily at 6 PM. 30 tablet 5  . azithromycin (ZITHROMAX) 600 MG tablet Take 2 tablets (1,200 mg total) by mouth once a week. 12 tablet 1  . carvedilol (COREG) 25 MG tablet Take 1 tablet (25 mg total) by mouth 2 (two) times daily with a meal. 60 tablet 11  . cetirizine (ZYRTEC) 10 MG tablet Take 1 tablet (10 mg total) by mouth daily. 30 tablet 2  . diclofenac sodium (VOLTAREN) 1 % GEL Apply 2 g topically 4 (four) times daily. 100 g 3  . DULoxetine (CYMBALTA) 30 MG capsule Take 1 capsule (30 mg total) by mouth daily. 30 capsule 2  . elvitegravir-cobicistat-emtricitabine-tenofovir (GENVOYA) 150-150-200-10 MG TABS tablet Take 1 tablet by mouth daily with breakfast. 90 tablet 3  . ketoconazole (NIZORAL) 2 % cream Apply 1 application topically 2 (two) times daily as needed for irritation. 60 g 5  . levETIRAcetam (KEPPRA) 1000 MG tablet Take 1 tablet (1,000 mg total) by mouth 2 (two) times daily. 60 tablet 5  . lidocaine (XYLOCAINE) 5 % ointment Apply 1 application topically as needed. 35.44 g 0  . lisinopril (PRINIVIL,ZESTRIL) 40 MG tablet Take 1 tablet (40 mg total) by mouth daily. 30 tablet 5  . potassium chloride SA (K-DUR,KLOR-CON) 20 MEQ tablet Take 2 tablets (40 mEq total) by mouth daily. 180 tablet 3  . Skin Protectants, Misc. (EUCERIN) cream Apply topically as needed for wound care. 397 g 0  . sulfamethoxazole-trimethoprim (BACTRIM) 400-80 MG tablet Take 1 tablet by mouth 3 (three) times a week. 30 tablet 3   No current facility-administered medications for this visit.    Allergies:   Review of patient's allergies indicates no known allergies.    Social History:  The patient  reports that he has quit smoking. His smoking use included Cigars. He has never used smokeless tobacco. He reports that  he drinks alcohol. He reports that he uses illicit drugs (Marijuana).   Family History:  The patient's family history includes Diabetes in his mother; Heart attack in his father; Heart disease in his father; Hypertension in his brother, father, and sister; Stroke in his sister.    ROS:   Please see the history of present illness.   Review of Systems  All other systems reviewed and are negative.    PHYSICAL EXAM: VS:  BP 104/62 mmHg  Pulse 54  Ht 5\' 10"  (1.778 m)  Wt 171 lb 9.6 oz (77.837 kg)  BMI 24.62 kg/m2    Wt Readings from Last 3 Encounters:  01/20/16 171 lb 9.6 oz (77.837 kg)  01/07/16 162 lb (73.483 kg)  12/30/15 176 lb (79.833 kg)     GEN: Well nourished, well developed, in no acute distress HEENT: normal Neck: no JVD,  no masses Cardiac:  Normal S1/S2, irregular irregular rhythm; no murmur , no rubs or gallops, no edema   Respiratory:  clear to auscultation bilaterally, no wheezing, rhonchi or rales. GI: soft, nontender, nondistended, + BS MS: no deformity or atrophy Skin: dry lower legs with excoriations Psych: Normal affect   EKG:  EKG prior AFib, HR 75, LAD, NSSTTW changes   Recent Labs: 10/20/2015: Magnesium 1.9 12/30/2015: ALT 8*; BUN 14; Creat 1.33; Hemoglobin 11.8*; Platelets 122*; Potassium 4.2; Sodium 135    Lipid Panel    Component Value Date/Time   CHOL 164 11/01/2014 0601   TRIG 72 11/01/2014 0601   HDL 28* 11/01/2014 0601   CHOLHDL 5.9 11/01/2014 0601   VLDL 14 11/01/2014 0601   LDLCALC 122* 11/01/2014 0601      ASSESSMENT AND PLAN:  Atrial Fibrillation:  This patients CHA2DS2-VASc Score and unadjusted Ischemic Stroke Rate (% per year) is equal to 4.8 % stroke rate/year from a score of 4.  Above score calculated as 1 point each if present [CHF, HTN, DM, Vascular=MI/PAD/Aortic Plaque, Age if 65-74, or Male].  Above score calculated as 2 points each if present [Age > 75, or Stroke/TIA/TE].  Continue A/C with Eliquis 5 mg twice a day (age  less than 80, creatinine less than 1.5, weight greater than 60 kg).  He reports compliance with eliquis and denies bleeding.  His HR is controlled on BB.   Dilated cardiomyopathy-resolved:  Probably related to untreated HTN given the fact that his ejection fraction has improved back to 55-60% after treatment of blood pressure.  No source of embolus on TEE.  Continue beta-blocker, ACE inhibitor.  His blood pressure was actually fairly low recently and we are stopping amlodipine 10 mg. He is not having any symptoms to suggest ischemia, I will hold off on proceeding with stress testing.  Hypertensive heart disease without heart failure:  Continue blood pressure control, however since he has been excellently controlled and quite low currently, we will stop his amlodipine 10 mg. I would like for him to continue his carvedilol and lisinopril a possible given his prior cardiomyopathy.  Hyperlipidemia LDL goal <70:  Continue statin.    History of stroke:  As he is in atrial fibrillation today, he does not need an event monitor. Anticoagulation as discussed above.   Seizure - 1 event on 06/24/15, no new CVA on CT.  This is likely related to old CVA.  Patient has been started on Keppra and due for follow-up with neurology (Dr. Karel Jarvis) Tolerating Keppra w/o seizure recurrence.  Aneurysm - bilobed right A1-2 junction 4mm aneurysm seen CT on 06/24/15.  He follows up with neuro, Dr. Karel Jarvis   Cigarette smoker:  He has quit smoking.   Current medicines are reviewed at length with the patient today.  Concerns regarding medicines are as outlined above.  The following changes have been made: Stopped amlodipine 10.   Labs/ tests ordered today include:  No orders of the defined types were placed in this encounter.     6 month follow up. He may be moving to [place] to live with his daughter  [name] Schultz, MD

## 2016-01-20 NOTE — Patient Instructions (Signed)
Medication Instructions:  Please stop your Amlodipine.  Continue all other medications as listed.  Follow-Up: Follow up in 6 months with Dr. Skains.  You will receive a letter in the mail 2 months before you are due.  Please call us when you receive this letter to schedule your follow up appointment.  If you need a refill on your cardiac medications before your next appointment, please call your pharmacy.  Thank you for choosing [facility]!!      

## 2016-01-26 ENCOUNTER — Telehealth: Payer: Self-pay | Admitting: Family Medicine

## 2016-01-26 ENCOUNTER — Telehealth: Payer: Self-pay | Admitting: *Deleted

## 2016-01-26 DIAGNOSIS — J302 Other seasonal allergic rhinitis: Secondary | ICD-10-CM

## 2016-01-26 DIAGNOSIS — I63521 Cerebral infarction due to unspecified occlusion or stenosis of right anterior cerebral artery: Secondary | ICD-10-CM

## 2016-01-26 DIAGNOSIS — IMO0002 Reserved for concepts with insufficient information to code with codable children: Secondary | ICD-10-CM

## 2016-01-26 DIAGNOSIS — I10 Essential (primary) hypertension: Secondary | ICD-10-CM

## 2016-01-26 DIAGNOSIS — Z8673 Personal history of transient ischemic attack (TIA), and cerebral infarction without residual deficits: Secondary | ICD-10-CM

## 2016-01-26 DIAGNOSIS — M79602 Pain in left arm: Secondary | ICD-10-CM

## 2016-01-26 NOTE — Telephone Encounter (Signed)
Thank you so much EkwokMichelle. He had called me to let me know that was his plan and he wanted a refill of this medication but it was way to early. I really appreciate you helping my guy out!!

## 2016-01-26 NOTE — Telephone Encounter (Signed)
Pt. Called to cancel his appointment for tomorrow and wanted to let his PCP Know that he has moved to another [place]. Pt. Would like for his PCP to call Him back. Please f/u with pt.

## 2016-01-26 NOTE — Telephone Encounter (Signed)
Patient called, wanted to let Ambre know that he did in fact move to [place], [place] to live with his daughter.  He needs help refilling his medication locally.  [facility] [place].  RN contacted pharmacies for assistance in the [facility] [place].  [place] [facility] will contact [facility] for transferring the medications he was receiving there. They already have the Genvoya, bactrim, and azithromycin on file.  Patient states he has about 1 week of medication left.  [facility]. Andree CossHowell, Rishaan Gunner M, RN

## 2016-01-27 ENCOUNTER — Ambulatory Visit: Payer: Medicaid Other | Admitting: Family Medicine

## 2016-01-27 MED ORDER — APIXABAN 2.5 MG PO TABS: 2.5000 mg | ORAL_TABLET | Freq: Two times a day (BID) | ORAL | Status: AC

## 2016-01-27 MED ORDER — LISINOPRIL 40 MG PO TABS: 40.0000 mg | ORAL_TABLET | Freq: Every day | ORAL | Status: DC

## 2016-01-27 MED ORDER — LIDOCAINE 5 % EX OINT: 1.0000 "application " | TOPICAL_OINTMENT | CUTANEOUS | Status: DC | PRN

## 2016-01-27 MED ORDER — CARVEDILOL 25 MG PO TABS: 25.0000 mg | ORAL_TABLET | Freq: Two times a day (BID) | ORAL | Status: AC

## 2016-01-27 MED ORDER — CETIRIZINE HCL 10 MG PO TABS: 10.0000 mg | ORAL_TABLET | Freq: Every day | ORAL | Status: DC

## 2016-01-27 MED ORDER — DICLOFENAC SODIUM 1 % TD GEL: 2.0000 g | Freq: Four times a day (QID) | TRANSDERMAL | Status: AC

## 2016-01-27 MED ORDER — ATORVASTATIN CALCIUM 20 MG PO TABS: 20.0000 mg | ORAL_TABLET | Freq: Every day | ORAL | Status: AC

## 2016-01-27 MED ORDER — DULOXETINE HCL 30 MG PO CPEP: 30.0000 mg | ORAL_CAPSULE | Freq: Every day | ORAL | Status: DC

## 2016-01-27 NOTE — Telephone Encounter (Signed)
Called patient Verified name and DOB He has moved to to [place] with his daughter Dia CrawfordShaneka Sheppard [phone]  [address] [place], [phone]  In month [place]  [place].   1. Needs med [facility] [place] 2. [place] resources [place].  I will route to this note to case management to help with resources. Patient request that his daughter Fuller SongShaneka be contacted with resources.   Please note that his family is not aware of his HIV status and he request that they not be informed.

## 2016-01-28 ENCOUNTER — Telehealth: Payer: Self-pay | Admitting: *Deleted

## 2016-01-28 NOTE — Telephone Encounter (Signed)
RN received a call from the patient on 01/27/16 stating he has relocated to his dtr's home in [place]. Things are going well but he would love to have his own home for him and his youngest dtr to live. Currently the patient and his youngest dtr are living with his oldest dtr and her family. Mr [name] Short stating he is going to run out of the medication that he takes 3 times a week. Mr [name] Short stating he will be all out of this medication by Tuesday of next week. RN instructed the patient that I would have to return his call since I was in the process of seeing another patient. RN did asked the patient to please text me the name of the medication he will be out of to ensure I get him the correct medication refilled. RN thanked the patient for calling me and stated that "I am always glad to hear from him". RN received the text message stating Azithromycin is the medication that he needs. Today(01/28/16) RN reviewed [name]'s medication list and we initially [facility] to [facility]. Mr [name] Short has already been contacted to [facility] [employer] by Micael HampshireMichelle Howell RN so I contacted [facility] [place] and requested a refill of the Zithromax. Pharm Tech stated they do not carry that strength of Zithromax in stock but will have it ordered and ready by Monday(01/31/2016)  RN contacted Mr [name] Short making him aware that his [facility]

## 2016-01-31 ENCOUNTER — Telehealth: Payer: Self-pay | Admitting: Family Medicine

## 2016-01-31 ENCOUNTER — Telehealth: Payer: Self-pay

## 2016-01-31 NOTE — Telephone Encounter (Signed)
At the request of Dr Armen PickupFunches, call placed to the patient's daughter, Chales SalmonShaneka Radde, to discuss primary care resources in [place]. The patient [place] medicaid and [place] when he has relocated.   [place] DSS - # [phone].They will be able to assist with helping him to locate a PCP. He has [place] and may be eligible for services through [facility] [place] - # [phone].   The [facility] when this CM called. Fuller SongShaneka stated that they have been looking for a place for him to live in [place]; but they have not had any luck finding housing.  She was not interested in the resource information at this time; but noted that she would be interested after a place to live has been identified. Instructed her to call the [facility] back if she has any questions or needs assistance identifying resources. She was very appreciative of the call.

## 2016-01-31 NOTE — Telephone Encounter (Signed)
Pt called requesting an order for a shower chair  Pt would like to pick it up at [facility] [place] Please update pt if the order would have to be picked up in office or could be sent over to [facility]  Thank you

## 2016-02-01 NOTE — Telephone Encounter (Signed)
Will forward to pcp

## 2016-02-02 ENCOUNTER — Ambulatory Visit: Payer: Self-pay | Admitting: Podiatry

## 2016-02-03 NOTE — Telephone Encounter (Signed)
rx placed in mail

## 2016-02-03 NOTE — Telephone Encounter (Signed)
Shower chair Rx written Please mail to patient as his daughter's address  [address] [place], [id number]

## 2016-02-08 NOTE — Telephone Encounter (Signed)
Error

## 2016-02-09 ENCOUNTER — Telehealth: Payer: Self-pay | Admitting: *Deleted

## 2016-02-10 ENCOUNTER — Telehealth: Payer: Self-pay | Admitting: *Deleted

## 2016-02-10 NOTE — Telephone Encounter (Signed)
RN received a call from Mr [name] Short stating he just wanted to check in with me and to be sure we have his address in [place]. Mr [name] Short stated he received a call stating he has a upcoming appt and also wanted to be sure that appt has been cancelled. Mr [name] Short asked if I could refer him to another infectious disease clinic in his area.  On 02/10/16, I spoke with [name] who suggested some local areas near [place] that could possibly assist with HIV management.  RN contacted [facility] (Address: [address], [place], [place] [address]) at [phone] making the office aware that we have a patient from our clinic that has [place] and needs HIV management and care. The nurse representative stated HIV patients take priority in their clinic as far as referrals and she stated their should not be an problem. She stated their physicians like to look over the patient information. RN stated I will fax over most recent labs, H&P and last infectious disease office visit. The nurse stated Melissa in their office is who normally handles the referrals but she is not into. Nurse stated it I fax the information over they will sure to take care of the patient. Nurse provided fax number of [phone]. RN will gather and fax the needed information for the referral

## 2016-02-10 NOTE — Telephone Encounter (Signed)
RN informed Mr [name] Short that he will have to sign a release form before we can send a copy of his information to [facility]. RN gave Mr. [name] Short the address for [facility] and phone number with instructions that he needs to go to their office and let them know that he needs a appointment and his records from our office sent to them. Then he will have to sign a release form, our fax number given to Mr [name] Short as well. All of this information was sent to Mr [name] Short in the from a text so he can refer back to it our show if the [facility] so they will know what he is trying to do

## 2016-02-11 ENCOUNTER — Ambulatory Visit: Payer: Medicaid Other | Admitting: Infectious Diseases

## 2016-02-22 ENCOUNTER — Encounter: Payer: Self-pay | Admitting: *Deleted

## 2016-02-25 ENCOUNTER — Other Ambulatory Visit: Payer: Self-pay | Admitting: Family Medicine

## 2016-02-25 DIAGNOSIS — M79602 Pain in left arm: Secondary | ICD-10-CM

## 2016-03-17 ENCOUNTER — Telehealth: Payer: Self-pay | Admitting: *Deleted

## 2016-03-17 ENCOUNTER — Other Ambulatory Visit: Payer: Self-pay | Admitting: Infectious Diseases

## 2016-03-17 DIAGNOSIS — B20 Human immunodeficiency virus [HIV] disease: Secondary | ICD-10-CM

## 2016-03-17 NOTE — Telephone Encounter (Signed)
Patient in the process of establishing new HIV care provider, not completed.  Seemed to think the A. [name] Short was handling everything.  RN will send this route this message to A. McNeil.  Patient seemed confused over the phone.

## 2016-03-20 NOTE — Telephone Encounter (Signed)
He is correct, I am working on this for him. It has taken me a while (going back and forth with [facility]) but I have the referral information and the ok to send proof of positivity

## 2016-03-30 ENCOUNTER — Telehealth: Payer: Self-pay | Admitting: Family Medicine

## 2016-03-30 NOTE — Telephone Encounter (Signed)
Patient called and left a vm on my medical records line and was wanting to speak with dr Armen Pickupfunches. Please follow up with patient. Thank you.

## 2016-03-31 ENCOUNTER — Other Ambulatory Visit: Payer: Self-pay | Admitting: Family Medicine

## 2016-03-31 DIAGNOSIS — M79602 Pain in left arm: Secondary | ICD-10-CM

## 2016-04-04 ENCOUNTER — Other Ambulatory Visit: Payer: Self-pay | Admitting: Family Medicine

## 2016-04-04 DIAGNOSIS — M79602 Pain in left arm: Secondary | ICD-10-CM

## 2016-04-25 ENCOUNTER — Ambulatory Visit: Payer: Medicaid Other | Admitting: Neurology

## 2016-05-17 NOTE — Telephone Encounter (Signed)
Late Entry: Discharge Note On 03/17/16 RN sent [facility] a referral for care since the patient has relocated and would like to receive care through their clinic.   The intent of this communication is to inform the Health Care Team that this patient will be [facility]  patient has relocated outside the service area. Effective 03/17/2016 patient will be discharged and removed from [facility] active patient listing.

## 2016-05-20 ENCOUNTER — Encounter (HOSPITAL_COMMUNITY): Payer: Self-pay | Admitting: Emergency Medicine

## 2016-05-20 ENCOUNTER — Inpatient Hospital Stay (HOSPITAL_COMMUNITY)
Admission: EM | Admit: 2016-05-20 | Discharge: 2016-05-25 | DRG: 469 | Disposition: A | Discharge status: Home / Self Care | Payer: Medicaid Other | Attending: Internal Medicine | Admitting: Internal Medicine

## 2016-05-20 ENCOUNTER — Emergency Department (HOSPITAL_COMMUNITY): Payer: Medicaid Other

## 2016-05-20 DIAGNOSIS — Z833 Family history of diabetes mellitus: Secondary | ICD-10-CM

## 2016-05-20 DIAGNOSIS — I447 Left bundle-branch block, unspecified: Secondary | ICD-10-CM | POA: Diagnosis present

## 2016-05-20 DIAGNOSIS — Z96642 Presence of left artificial hip joint: Secondary | ICD-10-CM

## 2016-05-20 DIAGNOSIS — I482 Chronic atrial fibrillation: Secondary | ICD-10-CM | POA: Diagnosis present

## 2016-05-20 DIAGNOSIS — I480 Paroxysmal atrial fibrillation: Secondary | ICD-10-CM | POA: Diagnosis present

## 2016-05-20 DIAGNOSIS — R079 Chest pain, unspecified: Secondary | ICD-10-CM | POA: Diagnosis present

## 2016-05-20 DIAGNOSIS — S72002A Fracture of unspecified part of neck of left femur, initial encounter for closed fracture: Secondary | ICD-10-CM | POA: Diagnosis not present

## 2016-05-20 DIAGNOSIS — R262 Difficulty in walking, not elsewhere classified: Secondary | ICD-10-CM

## 2016-05-20 DIAGNOSIS — I248 Other forms of acute ischemic heart disease: Secondary | ICD-10-CM | POA: Diagnosis present

## 2016-05-20 DIAGNOSIS — R7989 Other specified abnormal findings of blood chemistry: Secondary | ICD-10-CM | POA: Diagnosis present

## 2016-05-20 DIAGNOSIS — Z79899 Other long term (current) drug therapy: Secondary | ICD-10-CM

## 2016-05-20 DIAGNOSIS — Z8249 Family history of ischemic heart disease and other diseases of the circulatory system: Secondary | ICD-10-CM

## 2016-05-20 DIAGNOSIS — I4891 Unspecified atrial fibrillation: Secondary | ICD-10-CM | POA: Diagnosis not present

## 2016-05-20 DIAGNOSIS — Z8673 Personal history of transient ischemic attack (TIA), and cerebral infarction without residual deficits: Secondary | ICD-10-CM

## 2016-05-20 DIAGNOSIS — W109XXA Fall (on) (from) unspecified stairs and steps, initial encounter: Secondary | ICD-10-CM | POA: Diagnosis present

## 2016-05-20 DIAGNOSIS — D696 Thrombocytopenia, unspecified: Secondary | ICD-10-CM

## 2016-05-20 DIAGNOSIS — Z87891 Personal history of nicotine dependence: Secondary | ICD-10-CM | POA: Diagnosis not present

## 2016-05-20 DIAGNOSIS — S728X2S Other fracture of left femur, sequela: Secondary | ICD-10-CM | POA: Diagnosis not present

## 2016-05-20 DIAGNOSIS — Z823 Family history of stroke: Secondary | ICD-10-CM

## 2016-05-20 DIAGNOSIS — M1612 Unilateral primary osteoarthritis, left hip: Secondary | ICD-10-CM | POA: Diagnosis not present

## 2016-05-20 DIAGNOSIS — Z9889 Other specified postprocedural states: Secondary | ICD-10-CM | POA: Diagnosis not present

## 2016-05-20 DIAGNOSIS — W19XXXA Unspecified fall, initial encounter: Secondary | ICD-10-CM

## 2016-05-20 DIAGNOSIS — G8194 Hemiplegia, unspecified affecting left nondominant side: Secondary | ICD-10-CM

## 2016-05-20 DIAGNOSIS — I509 Heart failure, unspecified: Secondary | ICD-10-CM | POA: Diagnosis not present

## 2016-05-20 DIAGNOSIS — R748 Abnormal levels of other serum enzymes: Secondary | ICD-10-CM

## 2016-05-20 DIAGNOSIS — I69354 Hemiplegia and hemiparesis following cerebral infarction affecting left non-dominant side: Secondary | ICD-10-CM | POA: Diagnosis not present

## 2016-05-20 DIAGNOSIS — I429 Cardiomyopathy, unspecified: Secondary | ICD-10-CM

## 2016-05-20 DIAGNOSIS — W108XXS Fall (on) (from) other stairs and steps, sequela: Secondary | ICD-10-CM | POA: Diagnosis present

## 2016-05-20 DIAGNOSIS — Z419 Encounter for procedure for purposes other than remedying health state, unspecified: Secondary | ICD-10-CM

## 2016-05-20 DIAGNOSIS — B20 Human immunodeficiency virus [HIV] disease: Secondary | ICD-10-CM | POA: Diagnosis present

## 2016-05-20 DIAGNOSIS — S72042A Displaced fracture of base of neck of left femur, initial encounter for closed fracture: Secondary | ICD-10-CM | POA: Diagnosis not present

## 2016-05-20 DIAGNOSIS — M25552 Pain in left hip: Secondary | ICD-10-CM

## 2016-05-20 DIAGNOSIS — R0602 Shortness of breath: Secondary | ICD-10-CM | POA: Diagnosis present

## 2016-05-20 DIAGNOSIS — I10 Essential (primary) hypertension: Secondary | ICD-10-CM | POA: Diagnosis present

## 2016-05-20 DIAGNOSIS — S7292XA Unspecified fracture of left femur, initial encounter for closed fracture: Secondary | ICD-10-CM | POA: Diagnosis present

## 2016-05-20 DIAGNOSIS — N179 Acute kidney failure, unspecified: Secondary | ICD-10-CM | POA: Diagnosis present

## 2016-05-20 DIAGNOSIS — R778 Other specified abnormalities of plasma proteins: Secondary | ICD-10-CM | POA: Diagnosis present

## 2016-05-20 LAB — CBC WITH DIFFERENTIAL/PLATELET
BASOS ABS: 0 10*3/uL (ref 0.0–0.1)
Basophils Relative: 1 %
EOS PCT: 10 %
Eosinophils Absolute: 0.5 10*3/uL (ref 0.0–0.7)
HCT: 41.3 % (ref 39.0–52.0)
Hemoglobin: 13.6 g/dL (ref 13.0–17.0)
LYMPHS ABS: 1.2 10*3/uL (ref 0.7–4.0)
LYMPHS PCT: 22 %
MCH: 32.7 pg (ref 26.0–34.0)
MCHC: 32.9 g/dL (ref 30.0–36.0)
MCV: 99.3 fL (ref 78.0–100.0)
MONO ABS: 0.4 10*3/uL (ref 0.1–1.0)
Monocytes Relative: 8 %
Neutro Abs: 3.2 10*3/uL (ref 1.7–7.7)
Neutrophils Relative %: 59 %
PLATELETS: 124 10*3/uL — AB (ref 150–400)
RBC: 4.16 MIL/uL — ABNORMAL LOW (ref 4.22–5.81)
RDW: 13 % (ref 11.5–15.5)
WBC: 5.3 10*3/uL (ref 4.0–10.5)

## 2016-05-20 LAB — TROPONIN I: Troponin I: 0.04 ng/mL (ref <0.03)

## 2016-05-20 LAB — BASIC METABOLIC PANEL
Anion gap: 4 — ABNORMAL LOW (ref 5–15)
BUN: 14 mg/dL (ref 6–20)
CO2: 25 mmol/L (ref 22–32)
Calcium: 9 mg/dL (ref 8.9–10.3)
Chloride: 109 mmol/L (ref 101–111)
Creatinine, Ser: 1.51 mg/dL — ABNORMAL HIGH (ref 0.61–1.24)
GFR calc Af Amer: >60 mL/min (ref >60)
GFR, EST NON AFRICAN AMERICAN: 53 mL/min — AB (ref >60)
GLUCOSE: 112 mg/dL — AB (ref 65–99)
POTASSIUM: 4.3 mmol/L (ref 3.5–5.1)
Sodium: 138 mmol/L (ref 135–145)

## 2016-05-20 MED ORDER — NITROGLYCERIN 0.4 MG SL SUBL: 0.4000 mg | SUBLINGUAL_TABLET | SUBLINGUAL | Status: DC | PRN

## 2016-05-20 MED ORDER — HYDRALAZINE HCL 20 MG/ML IJ SOLN
10.0000 mg | Freq: Once | INTRAMUSCULAR | Status: AC
  Administered 2016-05-20: 10 mg via INTRAVENOUS
  Filled 2016-05-20: qty 1

## 2016-05-20 MED ORDER — HYDROMORPHONE HCL 1 MG/ML IJ SOLN
0.5000 mg | Freq: Once | INTRAMUSCULAR | Status: AC
  Administered 2016-05-20: 0.5 mg via INTRAVENOUS
  Filled 2016-05-20: qty 1

## 2016-05-20 MED ORDER — CARVEDILOL 25 MG PO TABS
25.0000 mg | ORAL_TABLET | Freq: Once | ORAL | Status: AC
  Administered 2016-05-20: 25 mg via ORAL
  Filled 2016-05-20: qty 1

## 2016-05-20 MED ORDER — HYDROMORPHONE HCL 1 MG/ML IJ SOLN
1.0000 mg | Freq: Once | INTRAMUSCULAR | Status: AC
  Administered 2016-05-20: 1 mg via INTRAVENOUS
  Filled 2016-05-20: qty 1

## 2016-05-20 MED ORDER — ASPIRIN 81 MG PO CHEW
324.0000 mg | CHEWABLE_TABLET | Freq: Once | ORAL | Status: AC
  Administered 2016-05-20: 324 mg via ORAL
  Filled 2016-05-20: qty 4

## 2016-05-20 NOTE — ED Notes (Signed)
Bed: WA14 Expected date:  Expected time:  Means of arrival:  Comments: 

## 2016-05-20 NOTE — H&P (Signed)
History and Physical    [name] ColeCarl Short WUJ:[id number] DOB: 14-Feb-1966 DOA: 05/20/2016  PCP: [name], [name], MD  Patient coming from: home  Chief Complaint:   Fall, left leg pain  HPI: [name] ColeCarl Dominy is a 50 y.o. male with medical history significant of recent dx of HIV, CM, CVA with left sided weakness, HTN, afib comes in after falling down a couple of stairs and injuring his left leg.  No LOC.  Pt has an obvious deformity to his left femur.  After the fall he did experience some sob and chest pain which is resolved now.  He is in a lot of pain, and can barely move.  Pt found to have left femoral fracture which is displaced.  Pt referred for admission for the need for surgical intervention.  Review of Systems: As per HPI otherwise 10 point review of systems negative.   Past Medical History:  Diagnosis Date  . Cardiomyopathy (HCC)    a. 10/2014 Echo: EF 35-40%, diff HK, sev LVH, mod dil LA, PASP 45mmHg.;  b.  Echo 7/16: Severe LVH, EF 55-60%, aortic sclerosis without stenosis, mild AI, trivial MR, severe BAE  . Depression   . HIV infection [facility](HCC) April 2017  . Hypertension    a. Previously on meds but none in several years.  . Seizures (HCC) since 03/2015  . Smoker    a. Previous tobacco - quit ~ 2013. Ongoing daily marijuana usage. Occasional cigar.  . Stroke [facility](HCC)    a. 10/2014 MRI Head: acute mod size nonhemorrhagic R ant cerebral artery distribution infarct involving the medial aspect of the right frontal and parietal lobe and right aspect of the corpus callosum w/ local mass effect-->TPA;  b. 10/2014 MRA: Abrupt cut off of the R ACA A2 segment;  b. 10/2014 Carotid U/S: 1-39% bilat ICA stenosis.  . Tuberculosis    "took RX for 4628yr in the 1990s" (10/19/2015)    Past Surgical History:  Procedure Laterality Date  . FRACTURE SURGERY    . MULTIPLE TOOTH EXTRACTIONS    . NASAL FRACTURE SURGERY  1983  . TEE WITHOUT CARDIOVERSION N/A 11/03/2014   Procedure: TRANSESOPHAGEAL ECHOCARDIOGRAM  (TEE);  Surgeon: Laurey Moralealton S McLean, MD;  [facility];  Service: Cardiovascular;  Laterality: N/A;     reports that he has quit smoking. His smoking use included Cigars. He quit after 34.00 years of use. He has never used smokeless tobacco. He reports that he drinks alcohol. He reports that he uses drugs, including Marijuana.  No Known Allergies  Family History  Problem Relation Age of Onset  . Diabetes Mother   . Heart disease Father   . Heart attack Father   . Hypertension Father   . Other      no premature cad  . Stroke Sister   . Hypertension Brother   . Hypertension Sister     Prior to Admission medications   Medication Sig Start Date End Date Taking? Authorizing Provider  apixaban (ELIQUIS) 2.5 MG TABS tablet Take 1 tablet (2.5 mg total) by mouth 2 (two) times daily. 01/27/16   Josalyn Funches, MD  atorvastatin (LIPITOR) 20 MG tablet Take 1 tablet (20 mg total) by mouth daily at 6 PM. 01/27/16   Dessa PhiJosalyn Funches, MD  azithromycin (ZITHROMAX) 600 MG tablet TAKE 2 TABLETS BY MOUTH ONCE A WEEK 03/17/16   Ginnie SmartJeffrey C Hatcher, MD  carvedilol (COREG) 25 MG tablet Take 1 tablet (25 mg total) by mouth 2 (two) times daily with a meal.  01/27/16   Josalyn Funches, MD  cetirizine (ZYRTEC) 10 MG tablet Take 1 tablet (10 mg total) by mouth daily. 01/27/16   Josalyn Funches, MD  diclofenac sodium (VOLTAREN) 1 % GEL Apply 2 g topically 4 (four) times daily. 01/27/16   Josalyn Funches, MD  DULoxetine (CYMBALTA) 30 MG capsule Take 1 capsule (30 mg total) by mouth daily. 01/27/16   Dessa Phi, MD  elvitegravir-cobicistat-emtricitabine-tenofovir (GENVOYA) 150-150-200-10 MG TABS tablet Take 1 tablet by mouth daily with breakfast. 01/07/16   Ginnie Smart, MD  ketoconazole (NIZORAL) 2 % cream Apply 1 application topically 2 (two) times daily as needed for irritation. 01/13/16   Josalyn Funches, MD  levETIRAcetam (KEPPRA) 1000 MG tablet Take 1 tablet (1,000 mg total) by mouth 2 (two) times daily. 11/25/15    Van Clines, MD  lidocaine (XYLOCAINE) 5 % ointment APPLY OINTMENT TO THE AFFECTED AREA AS NEEDED 04/03/16   Dessa Phi, MD  lisinopril (PRINIVIL,ZESTRIL) 40 MG tablet Take 1 tablet (40 mg total) by mouth daily. 01/27/16   Josalyn Funches, MD  potassium chloride SA (K-DUR,KLOR-CON) 20 MEQ tablet Take 2 tablets (40 mEq total) by mouth daily. 06/29/15   Jake Bathe, MD  Skin Protectants, Misc. (EUCERIN) cream Apply topically as needed for wound care. 01/13/16   Josalyn Funches, MD  sulfamethoxazole-trimethoprim (BACTRIM) 400-80 MG tablet Take 1 tablet by mouth 3 (three) times a week. 01/07/16   Ginnie Smart, MD    Physical Exam: Vitals:   05/20/16 1958 05/20/16 2020 05/20/16 2026 05/20/16 2053  BP: (!) 171/131 (!) 183/118 (!) 182/133 (!) 192/107  Pulse: 75 79 78 69  Resp: 18 20 18 18   Temp: 97.8 F (36.6 C)     TempSrc: Oral     SpO2: 96% 98%  100%  Weight:      Height:          Constitutional: NAD, calm, in pain with any movement Vitals:   05/20/16 1958 05/20/16 2020 05/20/16 2026 05/20/16 2053  BP: (!) 171/131 (!) 183/118 (!) 182/133 (!) 192/107  Pulse: 75 79 78 69  Resp: 18 20 18 18   Temp: 97.8 F (36.6 C)     TempSrc: Oral     SpO2: 96% 98%  100%  Weight:      Height:       Eyes: PERRL, lids and conjunctivae normal ENMT: Mucous membranes are moist. Posterior pharynx clear of any exudate or lesions.Normal dentition.  Neck: normal, supple, no masses, no thyromegaly Respiratory: clear to auscultation bilaterally, no wheezing, no crackles. Normal respiratory effort. No accessory muscle use.  Cardiovascular: Regular rate and rhythm, no murmurs / rubs / gallops. No extremity edema. 2+ pedal pulses. No carotid bruits.  Abdomen: no tenderness, no masses palpated. No hepatosplenomegaly. Bowel sounds positive.  Musculoskeletal: no clubbing / cyanosis. No joint deformity upper and lower extremities. Good ROM, no contractures. Normal muscle tone.  Left femur deformity,  pulses intact Skin: no rashes, lesions, ulcers. No induration Neurologic: CN 2-12 grossly intact. Sensation intact, DTR normal. Strength 5/5 in all 4.  Psychiatric: Normal judgment and insight. Alert and oriented x 3. Normal mood.    Labs on Admission: I have personally reviewed following labs and imaging studies  CBC:  Recent Labs Lab 05/20/16 1809  WBC 5.3  NEUTROABS 3.2  HGB 13.6  HCT 41.3  MCV 99.3  PLT 124*   Basic Metabolic Panel:  Recent Labs Lab 05/20/16 1809  NA 138  K 4.3  CL 109  CO2 25  GLUCOSE 112*  BUN 14  CREATININE 1.51*  CALCIUM 9.0   GFR: Estimated Creatinine Clearance: 67 mL/min (by C-G formula based on SCr of 1.51 mg/dL (H)).  Cardiac Enzymes:  Recent Labs Lab 05/20/16 1809  TROPONINI 0.04*    Urine analysis:    Component Value Date/Time   COLORURINE YELLOW 10/19/2015 2229   APPEARANCEUR CLEAR 10/19/2015 2229   LABSPEC 1.010 10/19/2015 2229   PHURINE 7.5 10/19/2015 2229   GLUCOSEU NEGATIVE 10/19/2015 2229   HGBUR SMALL (A) 10/19/2015 2229   BILIRUBINUR small 12/30/2015 1429   KETONESUR NEGATIVE 10/19/2015 2229   PROTEINUR 30 12/30/2015 1429   PROTEINUR NEGATIVE 10/19/2015 2229   UROBILINOGEN 1.0 12/30/2015 1429   UROBILINOGEN 1.0 11/18/2014 1334   NITRITE negative 12/30/2015 1429   NITRITE NEGATIVE 10/19/2015 2229   LEUKOCYTESUR Negative 12/30/2015 1429    Radiological Exams on Admission: Dg Chest 1 View  Result Date: 05/20/2016 CLINICAL DATA:  Pt from home via EMS- Pt had CVA in 2016 and ambulates with cane. Pt missed a ledge walking onto a porch and fell. Pt c/o L hip pain. Pt is unable to bear weight on L hip and unable to move L extremity. Pt does have deficits to L side from previous CVA EXAM: CHEST 1 VIEW COMPARISON:  Chest x-ray dated 10/19/2015. FINDINGS: Cardiomegaly appears stable. Lungs are clear. No pleural effusion or pneumothorax seen. Osseous structures about the chest are unremarkable. IMPRESSION: No active  disease.  Stable cardiomegaly. Electronically Signed   By: Bary RichardStan  Maynard M.D.   On: 05/20/2016 19:12   Dg Hip Unilat With Pelvis 2-3 Views Left  Result Date: 05/20/2016 CLINICAL DATA:  Pt from home via EMS- Pt had CVA in 2016 and ambulates with cane. Pt missed a ledge walking onto a porch and fell. Pt c/o L hip pain. Pt is unable to bear weight on L hip and unable to move L extremity. Pt does have deficits to L side from previous CVA EXAM: DG HIP (WITH OR WITHOUT PELVIS) 2-3V LEFT COMPARISON:  None. FINDINGS: There is a displaced fracture within the subcapital region of the left femoral neck, with prominent angulation deformity approaching 90 degrees. Left femoral head appears to remain well-positioned relative to the acetabulum. No additional fracture or dislocation seen. Soft tissues about the pelvis and left hip are unremarkable. IMPRESSION: Displaced fracture within the left femoral neck, subcapital region, with associated prominent angulation deformity. Electronically Signed   By: Bary RichardStan  Maynard M.D.   On: 05/20/2016 19:14    EKG: Independently reviewed. afib . :LBBB  Assessment/Plan 50 yo male recent diagnosis of HIV (family NOT aware), afib on eloquis, CVA with left sided weakness had mechanical fall tonight now with left femur displaced fracture with subsequent chest pain and sob after his fall now resolved  Principal Problem:   Femur fracture, left (HCC)- [facility].  Pt will need cardiac clearance, cardiology service has been consulted to be seen tonight.  Have asked ED at [facility] [facility] to place foley cath at this time as pt is a surgical candidate and barely move without pain at this time, however the charge nurse has informed me that they cannot place foley caths in the ED, this will have to wait til he gets to [facility].  Keep npo after midnight for possible OR tomorrow, hold anticoagulants, pt is on eloquis, holding this also.   Place foley cath.  Active Problems:   Left  hemiparesis [facility])- old from previous CVA, at baseline walks with cane   Cardiomyopathy (HCC)- noted, EF normal per last echo   Atrial fibrillation (HCC)- rate controlled, holding eloquis   History of stroke- noted   AIDS (acquired immunodeficiency syndrome), CD4 <=200 (HCC)- clarify home meds   Fall (on) (from) other stairs and steps, sequela- noted   Elevated troponin- cardiology consult, currently chest pain free   Chest pain- serial troponin   DVT prophylaxis: scds Code Status:  full Family Communication: none Disposition Plan:  Per day team Consults called: ortho, cardiology Admission status:  Full admission transfer to [place] per orthopedic team request   Kathrin Folden A MD [facility]  If 7PM-7AM, please contact night-coverage [email or web address] Password [facility]  05/20/2016, 9:14 PM

## 2016-05-20 NOTE — ED Notes (Signed)
Morrie SheldonAshley, [place] and [place], RN notified of critical troponin 0.04.

## 2016-05-20 NOTE — ED Notes (Signed)
No respiratory or acute distress noted alert and oriented x 3 call light in reach no reaction to medication noted clear speech noted moves all extremities. 

## 2016-05-20 NOTE — ED Notes (Signed)
No respiratory or acute distress noted alert and oriented x 3 no reaction to medication noted. 

## 2016-05-20 NOTE — ED Provider Notes (Signed)
[facility]-EMERGENCY DEPT Provider Note   CSN: [id number] Arrival date & time: 05/20/16  1729     History   Chief Complaint Chief Complaint  Patient presents with  . Fall  . Hip Pain    HPI [name] Short is a 50 y.o. male.  [name] Short is a 50 y.o. male with h/o AIDs, HTN, right ACA ischemic stroke with residual hemiparesis, atrial fibrillation on eliquis, HLD, and cardiomyopathy presents to ED s/p fall. Patient reports he was walking down stairs when he slipped and fell, landing on his left hip. He endorses associated pain and decrease ROM. He denies numbness. He denies hitting his head or LOC. He is on elquis for afib and previous CVA. Of note, patient also endorses onset of SOB, chest pain, and chest tightness following fall. He denies palpitations or cough. No fever, neck pain, headache, rash.       Past Medical History:  Diagnosis Date  . Cardiomyopathy (HCC)    a. 10/2014 Echo: EF 35-40%, diff HK, sev LVH, mod dil LA, PASP .;  b.  Echo 7/16: Severe LVH, EF 55-60%, aortic sclerosis without stenosis, mild AI, trivial MR, severe BAE  . Depression   . HIV infection [facility]) April 2017  . Hypertension    a. Previously on meds but none in several years.  . Seizures (HCC) since 03/2015  . Smoker    a. Previous tobacco - quit ~ 2013. Ongoing daily marijuana usage. Occasional cigar.  . Stroke [facility])    a. 10/2014 MRI Head: acute mod size nonhemorrhagic R ant cerebral artery distribution infarct involving the medial aspect of the right frontal and parietal lobe and right aspect of the corpus callosum w/ local mass effect-->TPA;  b. 10/2014 MRA: Abrupt cut off of the R ACA A2 segment;  b. 10/2014 Carotid U/S: 1-39% bilat ICA stenosis.  . Tuberculosis    "took RX for 31yr in the 1990s" (10/19/2015)    Patient Active Problem List   Diagnosis Date Noted  . Fall (on) (from) other stairs and steps, sequela 05/20/2016  . Elevated troponin 05/20/2016  . Chest pain 05/20/2016  . SOB  (shortness of breath) 05/20/2016  . Femur fracture, left (HCC) 05/20/2016  . Closed fracture of left hip (HCC) 05/20/2016  . Elevated temperature 12/31/2015  . AIDS (acquired immunodeficiency syndrome), CD4 <=200 (HCC) 12/31/2015  . Pancytopenia (HCC) 10/29/2015  . Left arm pain 10/27/2015  . Dermatitis 10/26/2015  . Seizure (HCC) 10/19/2015  . Tobacco abuse   . Intertrigo 09/30/2015  . Tinea pedis 07/15/2015  . Onychomycosis of toenail 07/15/2015  . Localization-related symptomatic epilepsy and epileptic syndromes with complex partial seizures, not intractable, without status epilepticus (HCC) 07/14/2015  . History of stroke 07/14/2015  . 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 Gait disturbance, post-stroke 01/19/2015  . Atrial fibrillation (HCC) 12/25/2014  . Hypertensive heart disease 12/15/2014  . Cardiomyopathy (HCC) 12/08/2014  . Insomnia 12/07/2014  . Essential hypertension 12/07/2014  . Secondary cardiomyopathy (HCC) 11/05/2014  . Malignant hypertension 11/05/2014  . Hyperlipidemia LDL goal <70 11/05/2014  . Marijuana abuse 11/05/2014  . Hypokalemia 11/05/2014  . Left hemiparesis (HCC) 11/05/2014  . Cognitive deficit, post-stroke 11/05/2014  . Acute right anterior cerebral artery (ACA) ischemic stroke 10/31/2014    Past Surgical History:  Procedure Laterality Date  . FRACTURE SURGERY    . MULTIPLE TOOTH EXTRACTIONS    . NASAL FRACTURE SURGERY  1983  . TEE WITHOUT CARDIOVERSION N/A 11/03/2014   Procedure: TRANSESOPHAGEAL ECHOCARDIOGRAM (TEE);  Surgeon: Laurey Moralealton S McLean, MD;  [facility];  Service: Cardiovascular;  Laterality: N/A;       Home Medications    Prior to Admission medications   Medication Sig Start Date End Date Taking? Authorizing Provider  amLODipine (NORVASC) 10 MG tablet Take 1 tablet by mouth daily. 04/01/16   Historical Provider, MD  apixaban (ELIQUIS) 2.5 MG TABS tablet Take 1 tablet (2.5 mg total) by mouth 2 (two) times daily.  01/27/16   Josalyn Funches, MD  atorvastatin (LIPITOR) 20 MG tablet Take 1 tablet (20 mg total) by mouth daily at 6 PM. 01/27/16   Dessa PhiJosalyn Funches, MD  azithromycin (ZITHROMAX) 600 MG tablet TAKE 2 TABLETS BY MOUTH ONCE A WEEK 03/17/16   Ginnie SmartJeffrey C Hatcher, MD  carvedilol (COREG) 25 MG tablet Take 1 tablet (25 mg total) by mouth 2 (two) times daily with a meal. 01/27/16   Dessa PhiJosalyn Funches, MD  cetirizine (ZYRTEC) 10 MG tablet Take 1 tablet (10 mg total) by mouth daily. 01/27/16   Josalyn Funches, MD  diclofenac sodium (VOLTAREN) 1 % GEL Apply 2 g topically 4 (four) times daily. 01/27/16   Josalyn Funches, MD  DULoxetine (CYMBALTA) 30 MG capsule Take 1 capsule (30 mg total) by mouth daily. 01/27/16   Dessa PhiJosalyn Funches, MD  elvitegravir-cobicistat-emtricitabine-tenofovir (GENVOYA) 150-150-200-10 MG TABS tablet Take 1 tablet by mouth daily with breakfast. 01/07/16   Ginnie SmartJeffrey C Hatcher, MD  ketoconazole (NIZORAL) 2 % cream Apply 1 application topically 2 (two) times daily as needed for irritation. 01/13/16   Josalyn Funches, MD  levETIRAcetam (KEPPRA) 1000 MG tablet Take 1 tablet (1,000 mg total) by mouth 2 (two) times daily. 11/25/15   Van ClinesKaren M Aquino, MD  lidocaine (XYLOCAINE) 5 % ointment APPLY OINTMENT TO THE AFFECTED AREA AS NEEDED 04/03/16   Dessa PhiJosalyn Funches, MD  lisinopril (PRINIVIL,ZESTRIL) 40 MG tablet Take 1 tablet (40 mg total) by mouth daily. 01/27/16   Dessa PhiJosalyn Funches, MD  Potassium Chloride ER 20 MEQ TBCR Take 2 tablets by mouth daily.  05/14/16   Historical Provider, MD  potassium chloride SA (K-DUR,KLOR-CON) 20 MEQ tablet Take 2 tablets (40 mEq total) by mouth daily. 06/29/15   Jake BatheMark C Skains, MD  Skin Protectants, Misc. (EUCERIN) cream Apply topically as needed for wound care. 01/13/16   Josalyn Funches, MD  sulfamethoxazole-trimethoprim (BACTRIM) 400-80 MG tablet Take 1 tablet by mouth 3 (three) times a week. 01/07/16   Ginnie SmartJeffrey C Hatcher, MD  terbinafine (LAMISIL) 250 MG tablet Take 1 tablet by mouth daily. 05/01/16    Historical Provider, MD  traZODone (DESYREL) 50 MG tablet Take 1 tablet by mouth at bedtime. 05/09/16   Historical Provider, MD    Family History Family History  Problem Relation Age of Onset  . Diabetes Mother   . Heart disease Father   . Heart attack Father   . Hypertension Father   . Other      no premature cad  . Stroke Sister   . Hypertension Brother   . Hypertension Sister     Social History Social History  Substance Use Topics  . Smoking status: Former Smoker    Years: 34.00    Types: Cigars  . Smokeless tobacco: Never Used     Comment: 10/19/2015 "stopped smoking cigarettes in 2005; started back in 2013; started smoking Black N Milds in 2005"  . Alcohol use 0.0 oz/week     Comment: 10/19/2015 Alcohol use stopped in April 2016; "never drank alot when I did drink"  Allergies   Review of patient's allergies indicates no known allergies.   Review of Systems Review of Systems  Constitutional: Negative for fever.  HENT: Negative for trouble swallowing.   Eyes: Negative for visual disturbance.  Respiratory: Positive for chest tightness and shortness of breath. Negative for cough.   Cardiovascular: Positive for chest pain ( described as a tightness).  Gastrointestinal: Negative for abdominal pain, nausea and vomiting.  Genitourinary: Negative for dysuria and hematuria.  Musculoskeletal: Positive for arthralgias. Negative for neck pain.  Skin: Negative for rash.  Allergic/Immunologic: Positive for immunocompromised state.  Neurological: Negative for syncope, numbness and headaches.     Physical Exam Updated Vital Signs BP (!) 192/136 (BP Location: Left Arm)   Pulse 72   Temp 98 F (36.7 C) (Oral)   Resp 19   Ht 5\' 10"  (1.778 m)   Wt 88.5 kg   SpO2 97%   BMI 27.98 kg/m   Physical Exam  Constitutional: He appears well-developed and well-nourished. No distress.  HENT:  Head: Normocephalic and atraumatic.  Mouth/Throat: Oropharynx is clear and moist. No  oropharyngeal exudate.  Eyes: Conjunctivae and EOM are normal. Pupils are equal, round, and reactive to light. Right eye exhibits no discharge. Left eye exhibits no discharge. No scleral icterus.  Neck: Normal range of motion and phonation normal. Neck supple. No neck rigidity. Normal range of motion present.  Cardiovascular: Normal rate, normal heart sounds and intact distal pulses.  An irregularly irregular rhythm present.  No murmur heard. Pulmonary/Chest: Effort normal and breath sounds normal. No stridor. No respiratory distress. He has no wheezes. He has no rales.  Abdominal: Soft. Bowel sounds are normal. He exhibits no distension. There is no tenderness. There is no rigidity, no rebound, no guarding and no CVA tenderness.  Musculoskeletal: Normal range of motion.       Left hip: He exhibits tenderness and deformity.  Left hip: TTP and obvious deformity at left hip. Left lower extremity is externally rotated. Subjective decrease in light touch sensation in left lower extremity. 2+ PT pulses.   Lymphadenopathy:    He has no cervical adenopathy.  Neurological: He is alert. He is not disoriented. Coordination and gait normal. GCS eye subscore is 4. GCS verbal subscore is 5. GCS motor subscore is 6.  Skin: Skin is warm and dry. He is not diaphoretic.  Psychiatric: He has a normal mood and affect. His behavior is normal.     ED Treatments / Results  Labs (all labs ordered are listed, but only abnormal results are displayed) Labs Reviewed  BASIC METABOLIC PANEL - Abnormal; Notable for the following:       Result Value   Glucose, Bld 112 (*)    Creatinine, Ser 1.51 (*)    GFR calc non Af Amer 53 (*)    Anion gap 4 (*)    All other components within normal limits  CBC WITH DIFFERENTIAL/PLATELET - Abnormal; Notable for the following:    RBC 4.16 (*)    Platelets 124 (*)    All other components within normal limits  TROPONIN I - Abnormal; Notable for the following:    Troponin I 0.04  (*)    All other components within normal limits  TROPONIN I  Rosezena Sensor, ED    EKG  EKG Interpretation  Date/Time:  Saturday May 20 2016 18:36:47 EDT Ventricular Rate:  75 PR Interval:    QRS Duration: 124 QT Interval:  454 QTC Calculation: 508 R Axis:   -67  Text Interpretation:  Atrial fibrillation Left bundle branch block Confirmed by Fayrene Fearing  MD, MARK ([id number]) on 05/20/2016 7:26:37 PM       Radiology Dg Chest 1 View  Result Date: 05/20/2016 CLINICAL DATA:  Pt from home via EMS- Pt had CVA in 2016 and ambulates with cane. Pt missed a ledge walking onto a porch and fell. Pt c/o L hip pain. Pt is unable to bear weight on L hip and unable to move L extremity. Pt does have deficits to L side from previous CVA EXAM: CHEST 1 VIEW COMPARISON:  Chest x-ray dated 10/19/2015. FINDINGS: Cardiomegaly appears stable. Lungs are clear. No pleural effusion or pneumothorax seen. Osseous structures about the chest are unremarkable. IMPRESSION: No active disease.  Stable cardiomegaly. Electronically Signed   By: Bary Richard M.D.   On: 05/20/2016 19:12   Dg Hip Unilat With Pelvis 2-3 Views Left  Result Date: 05/20/2016 CLINICAL DATA:  Pt from home via EMS- Pt had CVA in 2016 and ambulates with cane. Pt missed a ledge walking onto a porch and fell. Pt c/o L hip pain. Pt is unable to bear weight on L hip and unable to move L extremity. Pt does have deficits to L side from previous CVA EXAM: DG HIP (WITH OR WITHOUT PELVIS) 2-3V LEFT COMPARISON:  None. FINDINGS: There is a displaced fracture within the subcapital region of the left femoral neck, with prominent angulation deformity approaching 90 degrees. Left femoral head appears to remain well-positioned relative to the acetabulum. No additional fracture or dislocation seen. Soft tissues about the pelvis and left hip are unremarkable. IMPRESSION: Displaced fracture within the left femoral neck, subcapital region, with associated prominent  angulation deformity. Electronically Signed   By: Bary Richard M.D.   On: 05/20/2016 19:14    Procedures Procedures (including critical care time)  Medications Ordered in ED Medications  nitroGLYCERIN (NITROSTAT) SL tablet 0.4 mg (not administered)  HYDROmorphone (DILAUDID) injection 0.5 mg (0.5 mg Intravenous Given 05/20/16 1900)  hydrALAZINE (APRESOLINE) injection 10 mg (10 mg Intravenous Given 05/20/16 1954)  HYDROmorphone (DILAUDID) injection 1 mg (1 mg Intravenous Given 05/20/16 1954)  aspirin chewable tablet 324 mg (324 mg Oral Given 05/20/16 2025)  hydrALAZINE (APRESOLINE) injection 10 mg (10 mg Intravenous Given 05/20/16 2053)  carvedilol (COREG) tablet 25 mg (25 mg Oral Given 05/20/16 2301)  HYDROmorphone (DILAUDID) injection 0.5 mg (0.5 mg Intravenous Given 05/20/16 2346)     Initial Impression / Assessment and Plan / ED Course  I have reviewed the triage vital signs and the nursing notes.  Pertinent labs & imaging results that were available during my care of the patient were reviewed by me and considered in my medical decision making (see chart for details).  Clinical Course  Value Comment By Time  DG Chest 1 View Cardiomegaly present. No evidence of consolidation, effusion, or PTX. No free air under diaphragm.  Lona Kettle, [place] 10/28 1920  DG Hip Unilat With Pelvis 2-3 Views Left Obvious fracture at left hip Lona Kettle, PA-C 10/28 1922    Patient presents to ED s/p fall with complaint of left hip pain. Patient is afebrile and non-toxic appearing in NAD. Vital signs remarkable for elevated BP, otherwise stable. Obvious deformity noted at left hip with external rotation and shortening of left lower extremity. Subjective decrease in sensation to left lower extremity. Distal pulses intact. Lungs are CTABL. Heart irregularly irregular rhythm, normal rate. Will check labs, EKG, CXR, and x-ray left hip and pelvis. Pain  meds and hydralazine given. Discussed  patient with Dr. Fayrene FearingJames, who also saw patient, agrees with plan.   X-ray remarkable for displaced fracture within left femoral neck and subcapital with angulation. Platelets low. AKI present. CXR remarkable for stable cardiomegaly, no acute process. Will consult surgery. Will likely need admission by hospitalist for chronic medical conditions.    Elevated troponin at 0.04. ASA given. EKG remarkable for afib and LBBB. Previous ECHO in 7/16 shows EF 55-60%.  Last seen by Dr. Anne FuSkains in 6/17. On Dr. Fayrene FearingJames evaluation, patient O2 sats dropping while asleep, placed on 2L [place] O2.   8:30PM: Spoke with Dr. Onalee Huaavid, greatly appreciate her time and input. Recommends consult initially to cardiology and surgery first, [facility]   8:52 PM: Patient is CP free. Blood pressure remains elevated, will hold SL NTG. BP 182/133, re-dose 10mg  hydralazine. Will consult to cardiology.   9:06 PM: Spoke with Dr. Dimple CaseyPauley of Cardiology, greatly appreciate his time and input. Suspect demand ischemia.  Recommend home beta blocker medication. Delta trop. Agrees to see patient. Will consult to surgery.  9:47 PM: Spoke with Dr. Magnus IvanBlackman, greatly appreciate his time. Recommends medical admission, will need cardiac clearance, and [facility]. Blood pressure has improved.   10:45 PM: Spoke with Dr. Onalee Huaavid, [facility] of elevated troponin, AKI, and hip fracture. Admitting physician will be Dr. Maryfrances Bunnellanford. Thank you for your continued care of this patient.   Final Clinical Impressions(s) / ED Diagnoses   Final diagnoses:  Closed fracture of left hip, initial encounter [facility](HCC)  Essential hypertension  Elevated troponin    New Prescriptions New Prescriptions   No medications on file     Lona Kettleshley Laurel Jordie Skalsky, PA-C 05/20/16 2356    Lona KettleAshley Laurel Maddalyn Lutze, PA-C 05/20/16 2358    Rolland PorterMark James, MD 05/29/16 2051

## 2016-05-20 NOTE — ED Notes (Signed)
Report to [facility] states on their way to pick up pt.

## 2016-05-20 NOTE — Progress Notes (Signed)
Patient ID: [name] ColeCarl Short, male   DOB: [birth year], 50 y.o.   MRN: [id number] I have reviewed [name] Short's x-rays.  He will need a left hip replacement to treat his displaced left hip femoral neck fracture.  Surgery will be scheduled once he is medically clear.  I will see him tomorrow in the morning a place a full consult note.  At that time, I will discuss with him the options as well as the risks and benefits involved.

## 2016-05-20 NOTE — ED Notes (Signed)
No respiratory or acute distress noted alert and oriented x 3 no reaction to medication noted call light in reach condom cath placed on pt due to need to urinate.

## 2016-05-20 NOTE — ED Notes (Signed)
Report

## 2016-05-20 NOTE — Consult Note (Addendum)
CARDIOLOGY CONSULT NOTE   Patient ID: [name] Short MRN: [id number], DOB/AGE: August 29, 1965   Admit date: 05/20/2016 Date of Consult: 05/20/2016   Primary Physician: [name], MD Primary Cardiologist: [name], MD  Problem List  Past Medical History:  Diagnosis Date  . Cardiomyopathy (HCC)    a. 10/2014 Echo: EF 35-40%, diff HK, sev LVH, mod dil LA, PASP .;  b.  Echo 7/16: Severe LVH, EF 55-60%, aortic sclerosis without stenosis, mild AI, trivial MR, severe BAE  . Depression   . HIV infection [facility]) April 2017  . Hypertension    a. Previously on meds but none in several years.  . Seizures (HCC) since 03/2015  . Smoker    a. Previous tobacco - quit ~ 2013. Ongoing daily marijuana usage. Occasional cigar.  . Stroke [facility])    a. 10/2014 MRI Head: acute mod size nonhemorrhagic R ant cerebral artery distribution infarct involving the medial aspect of the right frontal and parietal lobe and right aspect of the corpus callosum w/ local mass effect-->TPA;  b. 10/2014 MRA: Abrupt cut off of the R ACA A2 segment;  b. 10/2014 Carotid U/S: 1-39% bilat ICA stenosis.  . Tuberculosis    "took RX for 57yr in the 1990s" (10/19/2015)    Past Surgical History:  Procedure Laterality Date  . FRACTURE SURGERY    . MULTIPLE TOOTH EXTRACTIONS    . NASAL FRACTURE SURGERY  1983  . TEE WITHOUT CARDIOVERSION N/A 11/03/2014   Procedure: TRANSESOPHAGEAL ECHOCARDIOGRAM (TEE);  Surgeon: Laurey Morale, MD;  Location: [facility] ENDOSCOPY;  Service: Cardiovascular;  Laterality: N/A;     Allergies  No Known Allergies  HPI   [name] Short is a 50 y.o. male with a hx of untreated HTN, marijuana use, CVA 10/2014 with residual left sided weakness who presented with a mechanical fall and was found to have a displaced fracture within the left femoral neck. Cardiology was consulted after troponin returned positive and ECG showed LBBB.  The patient was admitted 10/2014 with an acute R brain CVA.  He was treated  with tPA.  He continued to have L sided weakness. He ambulates with a cane. He had an echo that demonstrated EF 35-40% in 10/2014. After medical therapy, his ejection fraction was 55-60% in July 2016.  TEE did not demonstrate embolic source.  Renal artery [place] was neg for RAS.  DCM was felt to be related to HTN heart disease because of his response to antihypertensives.  He tells me that he suffered a mechanical fall and fell down several steps. He denies syncope or presyncope. He denies dyspnea or chest pain that limits his activity, although he does have occasional chest heaviness that seems unrelated to exertion. No chest pain or heaviness currently, although his history is a bit difficult to follow (likely due to administration of opioid analgesics). No orthopnea, LE edema, weight gain. He performs all ADLs independently. He is on disability.  On admission, ECG showed LBBB. Troponin is 0.04. Cr is elevated at 1.51 (1.3 in 12/2015). He was hypertensive on arrival with SBP in 190s.  Family History Family History  Problem Relation Age of Onset  . Diabetes Mother   . Heart disease Father   . Heart attack Father   . Hypertension Father   . Other      no premature cad  . Stroke Sister   . Hypertension Brother   . Hypertension Sister      Social History Social History   Social  History  . Marital status: Single    Spouse name: N/A  . Number of children: 3  . Years of education: 12   Occupational History  . Unemployed    Social History Main Topics  . Smoking status: Former Smoker    Years: 34.00    Types: Cigars  . Smokeless tobacco: Never Used     Comment: 10/19/2015 "stopped smoking cigarettes in 2005; started back in 2013; started smoking Black N Milds in 2005"  . Alcohol use 0.0 oz/week     Comment: 10/19/2015 Alcohol use stopped in April 2016; "never drank alot when I did drink"  . Drug use:     Types: Marijuana     Comment: 10/19/2015 "none in the last 6 months"  . Sexual  activity: Not Currently   Other Topics Concern  . Not on file   Social History Narrative   [place] in [place] GF.  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   Right-handed.   Occasional caffeine use.     Review of Systems  General:  No chills, fever, night sweats or weight changes.  Cardiovascular:  See HPI Dermatological: No rash, lesions/masses Respiratory: No cough, dyspnea Urologic: No hematuria, dysuria Abdominal:   No nausea, vomiting, diarrhea, bright red blood per rectum, melena, or hematemesis Neurologic:  No visual changes, wkns, changes in mental status. All other systems reviewed and are otherwise negative except as noted above.  Physical Exam  Blood pressure (!) 161/112, pulse 78, temperature 97.8 F (36.6 C), temperature source Oral, resp. rate 20, height 5\' 10"  (1.778 m), weight 88.5 kg (195 lb), SpO2 100 %.  General: Pleasant, NAD Psych: Normal affect. Neuro: Alert and oriented X 3, residual L sided weakness HEENT: Normal  Neck: Supple without bruits or JVD. Lungs:  Resp regular and unlabored, CTA. Heart: RRR no s3, s4, or murmurs. Abdomen: Soft, non-tender, non-distended, BS + x 4.  Extremities: No clubbing, cyanosis or edema. DP/PT/Radials 2+ and equal bilaterally.  Labs   Recent Labs  05/20/16 1809  TROPONINI 0.04*   Lab Results  Component Value Date   WBC 5.3 05/20/2016   HGB 13.6 05/20/2016   HCT 41.3 05/20/2016   MCV 99.3 05/20/2016   PLT 124 (L) 05/20/2016     Recent Labs Lab 05/20/16 1809  NA 138  K 4.3  CL 109  CO2 25  BUN 14  CREATININE 1.51*  CALCIUM 9.0  GLUCOSE 112*   Lab Results  Component Value Date   CHOL 164 11/01/2014   HDL 28 (L) 11/01/2014   LDLCALC 122 (H) 11/01/2014   TRIG 72 11/01/2014   No results found for: DDIMER  Radiology/Studies  Dg Chest 1 View  Result Date: 05/20/2016 CLINICAL DATA:  Pt from home via EMS- Pt had CVA in 2016 and ambulates with cane. Pt missed a ledge walking onto a porch  and fell. Pt c/o L hip pain. Pt is unable to bear weight on L hip and unable to move L extremity. Pt does have deficits to L side from previous CVA EXAM: CHEST 1 VIEW COMPARISON:  Chest x-ray dated 10/19/2015. FINDINGS: Cardiomegaly appears stable. Lungs are clear. No pleural effusion or pneumothorax seen. Osseous structures about the chest are unremarkable. IMPRESSION: No active disease.  Stable cardiomegaly. Electronically Signed   By: Bary RichardStan  Maynard M.D.   On: 05/20/2016 19:12   Dg Hip Unilat With Pelvis 2-3 Views Left  Result Date: 05/20/2016 CLINICAL DATA:  Pt from home via EMS-  Pt had CVA in 2016 and ambulates with cane. Pt missed a ledge walking onto a porch and fell. Pt c/o L hip pain. Pt is unable to bear weight on L hip and unable to move L extremity. Pt does have deficits to L side from previous CVA EXAM: DG HIP (WITH OR WITHOUT PELVIS) 2-3V LEFT COMPARISON:  None. FINDINGS: There is a displaced fracture within the subcapital region of the left femoral neck, with prominent angulation deformity approaching 90 degrees. Left femoral head appears to remain well-positioned relative to the acetabulum. No additional fracture or dislocation seen. Soft tissues about the pelvis and left hip are unremarkable. IMPRESSION: Displaced fracture within the left femoral neck, subcapital region, with associated prominent angulation deformity. Electronically Signed   By: Bary RichardStan  Maynard M.D.   On: 05/20/2016 19:14   ECG  05/20/16 shows LBBB, atrial fibrillation, QRS 124 ms 10/19/15 shows IVCD, atrial fibrillation, QRS 115 ms  ASSESSMENT AND PLAN  1. Troponin Elevation 2. Atrial fibrillation 3. History of stroke 4. Uncontrolled hypertension 5. Acute Kidney Injury 3. Fall, hip fracture  The patient's mild troponin elevation is likely due to demand ischemia. He does not have any chest pain or dyspnea to suggest active acute coronary syndrome. He has LBBB, but his QRS has been widening over time (previously  intraventricular conduction delay) and this does not represent a major change. His atrial fibrillation is rate controlled. Given history of cardiomyopathy, a limited echocardiogram to eval LV function has been ordered. - Continue Atorvastatin, Coreg. Restart Lisinopril when Cr improves  Atrial fibrillation, he is rate controlled. Continue anticoagulation when safe from a bleeding/surgical standpoint. This patients CHA2DS2-VASc Score and unadjusted Ischemic Stroke Rate (% per year) is equal to 4.8 % stroke rate/year from a score of 4 Above score calculated as 1 point each if present [CHF, HTN, DM, Vascular=MI/PAD/Aortic Plaque, Age if 65-74, or Male] Above score calculated as 2 points each if present [Age > 75, or Stroke/TIA/TE]  History of Stroke: continue statin and anticoagulation as above Uncontrolled HTN: better with pain control and restarting home meds.  Nat ChristenSigned, Joslin Doell D, MD 05/20/2016, 10:27 PM

## 2016-05-20 NOTE — ED Triage Notes (Addendum)
Pt from home via EMS- Pt had CVA in 2016 and ambulates with cane. Pt missed a ledge walking onto a porch and fell. Pt c/o L hip pain. Pt did not hit head and has no other injuries. Pt takes blood thinner Eliquis. Pt reports 10/10 pain. Pt is unable to bear weight on L hip and unable to move L extremity. Pt does have deficits to L side from previous CVA. Pt is A&O and in NAD

## 2016-05-21 DIAGNOSIS — S72042A Displaced fracture of base of neck of left femur, initial encounter for closed fracture: Secondary | ICD-10-CM

## 2016-05-21 DIAGNOSIS — W108XXS Fall (on) (from) other stairs and steps, sequela: Secondary | ICD-10-CM

## 2016-05-21 LAB — CBC
HCT: 43 % (ref 39.0–52.0)
Hemoglobin: 14.2 g/dL (ref 13.0–17.0)
MCH: 32.4 pg (ref 26.0–34.0)
MCHC: 33 g/dL (ref 30.0–36.0)
MCV: 98.2 fL (ref 78.0–100.0)
PLATELETS: 122 10*3/uL — AB (ref 150–400)
RBC: 4.38 MIL/uL (ref 4.22–5.81)
RDW: 13 % (ref 11.5–15.5)
WBC: 9.8 10*3/uL (ref 4.0–10.5)

## 2016-05-21 LAB — BASIC METABOLIC PANEL
ANION GAP: 6 (ref 5–15)
BUN: 14 mg/dL (ref 6–20)
CALCIUM: 9.1 mg/dL (ref 8.9–10.3)
CO2: 25 mmol/L (ref 22–32)
CREATININE: 1.39 mg/dL — AB (ref 0.61–1.24)
Chloride: 105 mmol/L (ref 101–111)
GFR, EST NON AFRICAN AMERICAN: 59 mL/min — AB (ref >60)
Glucose, Bld: 98 mg/dL (ref 65–99)
Potassium: 4.5 mmol/L (ref 3.5–5.1)
SODIUM: 136 mmol/L (ref 135–145)

## 2016-05-21 LAB — TYPE AND SCREEN
ABO/RH(D): B POS
ANTIBODY SCREEN: NEGATIVE

## 2016-05-21 LAB — ABO/RH: ABO/RH(D): B POS

## 2016-05-21 LAB — TROPONIN I: Troponin I: 0.06 ng/mL (ref <0.03)

## 2016-05-21 MED ORDER — ELVITEG-COBIC-EMTRICIT-TENOFAF 150-150-200-10 MG PO TABS
1.0000 | ORAL_TABLET | Freq: Every day | ORAL | Status: DC
  Administered 2016-05-21 – 2016-05-25 (×5): 1 via ORAL
  Filled 2016-05-21 (×7): qty 1

## 2016-05-21 MED ORDER — LORATADINE 10 MG PO TABS
10.0000 mg | ORAL_TABLET | Freq: Every day | ORAL | Status: DC
  Administered 2016-05-21 – 2016-05-25 (×5): 10 mg via ORAL
  Filled 2016-05-21 (×5): qty 1

## 2016-05-21 MED ORDER — MORPHINE SULFATE (PF) 2 MG/ML IV SOLN: 0.5000 mg | INTRAVENOUS | Status: DC | PRN

## 2016-05-21 MED ORDER — ATORVASTATIN CALCIUM 20 MG PO TABS
20.0000 mg | ORAL_TABLET | Freq: Every day | ORAL | Status: DC
  Administered 2016-05-21 – 2016-05-25 (×4): 20 mg via ORAL
  Filled 2016-05-21 (×4): qty 1

## 2016-05-21 MED ORDER — CARVEDILOL 25 MG PO TABS
25.0000 mg | ORAL_TABLET | Freq: Two times a day (BID) | ORAL | Status: DC
  Administered 2016-05-21 – 2016-05-25 (×9): 25 mg via ORAL
  Filled 2016-05-21 (×8): qty 1

## 2016-05-21 MED ORDER — LEVETIRACETAM 500 MG PO TABS
1000.0000 mg | ORAL_TABLET | Freq: Two times a day (BID) | ORAL | Status: DC
  Administered 2016-05-21 – 2016-05-25 (×9): 1000 mg via ORAL
  Filled 2016-05-21 (×9): qty 2

## 2016-05-21 MED ORDER — LEVETIRACETAM 500 MG PO TABS: 1000.0000 mg | ORAL_TABLET | Freq: Two times a day (BID) | ORAL | Status: DC

## 2016-05-21 MED ORDER — HYDROCODONE-ACETAMINOPHEN 5-325 MG PO TABS
1.0000 | ORAL_TABLET | Freq: Four times a day (QID) | ORAL | Status: DC | PRN
  Administered 2016-05-21 – 2016-05-22 (×4): 2 via ORAL
  Filled 2016-05-21 (×4): qty 2

## 2016-05-21 MED ORDER — HYDRALAZINE HCL 20 MG/ML IJ SOLN
10.0000 mg | Freq: Once | INTRAMUSCULAR | Status: AC
  Administered 2016-05-21: 10 mg via INTRAVENOUS
  Filled 2016-05-21: qty 1

## 2016-05-21 MED ORDER — MORPHINE SULFATE (PF) 2 MG/ML IV SOLN
1.0000 mg | INTRAVENOUS | Status: DC | PRN
  Administered 2016-05-22: 1 mg via INTRAVENOUS
  Filled 2016-05-21 (×2): qty 1

## 2016-05-21 MED ORDER — SODIUM CHLORIDE 0.9 % IV SOLN
INTRAVENOUS | Status: DC
  Administered 2016-05-21: 10:00:00 via INTRAVENOUS

## 2016-05-21 MED ORDER — DULOXETINE HCL 30 MG PO CPEP
30.0000 mg | ORAL_CAPSULE | Freq: Every day | ORAL | Status: DC
  Administered 2016-05-21 – 2016-05-22 (×2): 30 mg via ORAL
  Filled 2016-05-21 (×2): qty 1

## 2016-05-21 MED ORDER — METHOCARBAMOL 1000 MG/10ML IJ SOLN
500.0000 mg | Freq: Four times a day (QID) | INTRAVENOUS | Status: DC | PRN
  Filled 2016-05-21: qty 5

## 2016-05-21 MED ORDER — METHOCARBAMOL 500 MG PO TABS
500.0000 mg | ORAL_TABLET | Freq: Four times a day (QID) | ORAL | Status: DC | PRN
  Administered 2016-05-21 – 2016-05-22 (×4): 500 mg via ORAL
  Filled 2016-05-21 (×6): qty 1

## 2016-05-21 MED ORDER — AZITHROMYCIN 600 MG PO TABS
1200.0000 mg | ORAL_TABLET | ORAL | Status: DC
  Administered 2016-05-21: 1200 mg via ORAL
  Filled 2016-05-21: qty 2

## 2016-05-21 MED ORDER — LISINOPRIL 40 MG PO TABS
40.0000 mg | ORAL_TABLET | Freq: Every day | ORAL | Status: DC
  Administered 2016-05-21 – 2016-05-25 (×5): 40 mg via ORAL
  Filled 2016-05-21 (×5): qty 1

## 2016-05-21 MED ORDER — SULFAMETHOXAZOLE-TRIMETHOPRIM 400-80 MG PO TABS: 1.0000 | ORAL_TABLET | ORAL | Status: DC

## 2016-05-21 MED ORDER — SULFAMETHOXAZOLE-TRIMETHOPRIM 400-80 MG PO TABS
1.0000 | ORAL_TABLET | ORAL | Status: DC
  Administered 2016-05-21 – 2016-05-25 (×3): 1 via ORAL
  Filled 2016-05-21 (×4): qty 1

## 2016-05-21 MED ORDER — LACTATED RINGERS IV SOLN
INTRAVENOUS | Status: DC
  Administered 2016-05-21: 04:00:00 via INTRAVENOUS

## 2016-05-21 NOTE — Progress Notes (Signed)
Subjective:  Patient denies chest pain or shortness of breath.  Objective:  Vital Signs in the last 24 hours: BP (!) 146/90 (BP Location: Right Arm)   Pulse 86   Temp 98 F (36.7 C) (Oral)   Resp 17   Ht 5\' 10"  (1.778 m)   Wt 88.5 kg (195 lb)   SpO2 94%   BMI 27.98 kg/m   Physical Exam: Middle-aged black male in no acute distress mildly dysarthric Lungs:  Clear Cardiac:  Regular rhythm, normal S1 and S2, no S3 Extremities:  No edema present  Intake/Output from previous day: 10/28 0701 - 10/29 0700 In: 146.3 [I.V.:146.3] Out: 350 [Urine:350]  Weight Filed Weights   05/20/16 1757  Weight: 88.5 kg (195 lb)    Lab Results: Basic Metabolic Panel:  Recent Labs  16/04/9609/28/17 1809 05/21/16 0456  NA 138 136  K 4.3 4.5  CL 109 105  CO2 25 25  GLUCOSE 112* 98  BUN 14 14  CREATININE 1.51* 1.39*   CBC:  Recent Labs  05/20/16 1809 05/21/16 0456  WBC 5.3 9.8  NEUTROABS 3.2  --   HGB 13.6 14.2  HCT 41.3 43.0  MCV 99.3 98.2  PLT 124* 122*   Cardiac Panel (last 3 results)  Recent Labs  05/20/16 1809 05/21/16 0652  TROPONINI 0.04* 0.06*    Telemetry: Reviewed   Assessment/Plan:  1. Recent mechanical fall with trivial elevation of troponin suspect this is demand ischemia or related to fall doubt this is an acute coronary syndrome 2. History of cardiomyopathy 3. Prior history of stroke with residual 5. Paroxysmal atrial fibrillation  Recommendations:  Echo was still pending at the time of this note. Would check echo to be sure LV function remained stable. Continue antihypertensive treatment. The patient tells been that he now lives in [place] and was just appear to pick up some clothing when he fell down the steps. Dr.Skains has seen him previously.     Darden PalmerW. Spencer Tilley, Jr.  MD [facility] Cardiology  05/21/2016, 2:08 PM

## 2016-05-21 NOTE — Progress Notes (Signed)
[facility]                                                                              Patient Demographics  [name] Short, is a 50 y.o. male, DOB - 02-Jun-1966, ZOX:[id number]  Admit date - 05/20/2016   Admitting Physician Haydee Monica, MD  Outpatient Primary MD for the patient is [name], MD  Outpatient specialists:   LOS - 1  days    Chief Complaint  Patient presents with  . Fall  . Hip Pain       Brief summary    [name] Short is a 50 y.o. male with medical history significant of recent dx of HIV, CM, CVA with left sided weakness, HTN, afib comes in after falling down a couple of stairs and injuring his left leg.  No LOC.  Pt has an obvious deformity to his left femur.  After the fall he did experience some sob and chest pain which was resolved.   Pt found to have left femoral fracture which is displaced.    Assessment & Plan    Principal Problem:   Femur fracture, left (HCC) - Seen by cardiology, 2-D echo still pending - Currently no chest pain or shortness of breath, troponin slightly positive, per cardiology- demand ischemia - Surgery to be planned in a.m. per orthopedics, continue to hold anticoagulants  Active Problems:   Left hemiparesis (HCC) with history of CVA - Due to prior CVA, at baseline walks with cane, no new neurological deficits    Cardiomyopathy (HCC) - Currently no chest pain or shortness of breath, or active cardiac symptoms, 2-D echo pending    Atrial fibrillation (HCC) -Currently rate controlled, continue to hold eliquis     AIDS (acquired immunodeficiency syndrome), CD4 <=200 (HCC) - Restart HAART meds     Fall (on) (from) other stairs and steps, sequela - PT evaluation after surgery  Hypertension - Restarted Coreg, lisinopril  Mild acute renal insufficiency  - Placed on gentle hydration, improving  Code Status: full  DVT Prophylaxis:  eliquis on hold  Family Communication: Discussed in detail with  the patient, all imaging results, lab results explained to the patient    Disposition Plan:   Time Spent in minutes   25 minutes  Procedures:    Consultants:   Cardiology Orthopedics  Antimicrobials:      Medications  Scheduled Meds: . atorvastatin  20 mg Oral q1800  . azithromycin  1,200 mg Oral Weekly  . carvedilol  25 mg Oral BID WC  . DULoxetine  30 mg Oral Daily  . elvitegravir-cobicistat-emtricitabine-tenofovir  1 tablet Oral Q breakfast  . levETIRAcetam  1,000 mg Oral BID  . lisinopril  40 mg Oral Daily  . loratadine  10 mg Oral Daily  . [START ON 05/22/2016] sulfamethoxazole-trimethoprim  1 tablet Oral Once per day on Mon Wed Fri   Continuous Infusions: . lactated ringers 75 mL/hr at 05/21/16 0419   PRN Meds:.HYDROcodone-acetaminophen, methocarbamol **OR** methocarbamol (ROBAXIN)  IV, morphine injection, nitroGLYCERIN   Antibiotics   Anti-infectives    Start     Dose/Rate Route Frequency Ordered Stop  05/22/16 0900  sulfamethoxazole-trimethoprim (BACTRIM,SEPTRA) 400-80 MG per tablet 1 tablet     1 tablet Oral Once per day on Mon Wed Fri 05/21/16 0858     05/21/16 1000  azithromycin (ZITHROMAX) tablet 1,200 mg     1,200 mg Oral Weekly 05/21/16 0858     05/21/16 1000  elvitegravir-cobicistat-emtricitabine-tenofovir (GENVOYA) 150-150-200-10 MG tablet 1 tablet     1 tablet Oral Daily with breakfast 05/21/16 0858          Subjective:   [name] Short was seen and examined today. Complaining of pain, BP elevated.  Patient denies dizziness, chest pain, shortness of breath, abdominal pain, N/V/D/C. No acute events overnight.    Objective:   Vitals:   05/20/16 2242 05/21/16 0032 05/21/16 0200 05/21/16 0642  BP: (!) 150/112 (!) 171/102 (!) 144/85 (!) 150/108  Pulse: 105 85 89 82  Resp: 20 15  17   Temp: 98.7 F (37.1 C) 99.8 F (37.7 C)    TempSrc: Oral Oral    SpO2: 98% 96%  93%  Weight:      Height:        Intake/Output Summary (Last 24 hours) at  05/21/16 1009 Last data filed at 05/21/16 0643  Gross per 24 hour  Intake           146.25 ml  Output              350 ml  Net          -203.75 ml     Wt Readings from Last 3 Encounters:  05/20/16 88.5 kg (195 lb)  01/20/16 77.8 kg (171 lb 9.6 oz)  01/07/16 73.5 kg (162 lb)     Exam  General: Alert and oriented x 3, NAD  HEENT:  PERRLA, EOMI, Anicteric Sclera   Neck: Supple, no JVD, no masses  Cardiovascular: S1 S2 auscultated, no rubs, murmurs or gallops. Regular rate and rhythm.  Respiratory: Clear to auscultation bilaterally, no wheezing, rales or rhonchi  Gastrointestinal: Soft, nontender, nondistended, + bowel sounds  Ext: no cyanosis clubbing or edema  Neuro: left sided weakness chronic   Skin: No rashes  Psych: Normal affect and demeanor, alert and oriented x3    Data Reviewed:  I have personally reviewed following labs and imaging studies  Micro Results No results found for this or any previous visit (from the past 240 hour(s)).  Radiology Reports Dg Chest 1 View  Result Date: 05/20/2016 CLINICAL DATA:  Pt from home via EMS- Pt had CVA in 2016 and ambulates with cane. Pt missed a ledge walking onto a porch and fell. Pt c/o L hip pain. Pt is unable to bear weight on L hip and unable to move L extremity. Pt does have deficits to L side from previous CVA EXAM: CHEST 1 VIEW COMPARISON:  Chest x-ray dated 10/19/2015. FINDINGS: Cardiomegaly appears stable. Lungs are clear. No pleural effusion or pneumothorax seen. Osseous structures about the chest are unremarkable. IMPRESSION: No active disease.  Stable cardiomegaly. Electronically Signed   By: Bary RichardStan  Maynard M.D.   On: 05/20/2016 19:12   Dg Hip Unilat With Pelvis 2-3 Views Left  Result Date: 05/20/2016 CLINICAL DATA:  Pt from home via EMS- Pt had CVA in 2016 and ambulates with cane. Pt missed a ledge walking onto a porch and fell. Pt c/o L hip pain. Pt is unable to bear weight on L hip and unable to move L  extremity. Pt does have deficits to L side from previous CVA EXAM:  DG HIP (WITH OR WITHOUT PELVIS) 2-3V LEFT COMPARISON:  None. FINDINGS: There is a displaced fracture within the subcapital region of the left femoral neck, with prominent angulation deformity approaching 90 degrees. Left femoral head appears to remain well-positioned relative to the acetabulum. No additional fracture or dislocation seen. Soft tissues about the pelvis and left hip are unremarkable. IMPRESSION: Displaced fracture within the left femoral neck, subcapital region, with associated prominent angulation deformity. Electronically Signed   By: Bary RichardStan  Maynard M.D.   On: 05/20/2016 19:14    Lab Data:  CBC:  Recent Labs Lab 05/20/16 1809 05/21/16 0456  WBC 5.3 9.8  NEUTROABS 3.2  --   HGB 13.6 14.2  HCT 41.3 43.0  MCV 99.3 98.2  PLT 124* 122*   Basic Metabolic Panel:  Recent Labs Lab 05/20/16 1809 05/21/16 0456  NA 138 136  K 4.3 4.5  CL 109 105  CO2 25 25  GLUCOSE 112* 98  BUN 14 14  CREATININE 1.51* 1.39*  CALCIUM 9.0 9.1   GFR: Estimated Creatinine Clearance: 72.8 mL/min (by C-G formula based on SCr of 1.39 mg/dL (H)). Liver Function Tests: No results for input(s): AST, ALT, ALKPHOS, BILITOT, PROT, ALBUMIN in the last 168 hours. No results for input(s): LIPASE, AMYLASE in the last 168 hours. No results for input(s): AMMONIA in the last 168 hours. Coagulation Profile: No results for input(s): INR, PROTIME in the last 168 hours. Cardiac Enzymes:  Recent Labs Lab 05/20/16 1809 05/21/16 0652  TROPONINI 0.04* 0.06*   BNP (last 3 results) No results for input(s): PROBNP in the last 8760 hours. HbA1C: No results for input(s): HGBA1C in the last 72 hours. CBG: No results for input(s): GLUCAP in the last 168 hours. Lipid Profile: No results for input(s): CHOL, HDL, LDLCALC, TRIG, CHOLHDL, LDLDIRECT in the last 72 hours. Thyroid Function Tests: No results for input(s): TSH, T4TOTAL, FREET4,  T3FREE, THYROIDAB in the last 72 hours. Anemia Panel: No results for input(s): VITAMINB12, FOLATE, FERRITIN, TIBC, IRON, RETICCTPCT in the last 72 hours. Urine analysis:    Component Value Date/Time   COLORURINE YELLOW 10/19/2015 2229   APPEARANCEUR CLEAR 10/19/2015 2229   LABSPEC 1.010 10/19/2015 2229   PHURINE 7.5 10/19/2015 2229   GLUCOSEU NEGATIVE 10/19/2015 2229   HGBUR SMALL (A) 10/19/2015 2229   BILIRUBINUR small 12/30/2015 1429   KETONESUR NEGATIVE 10/19/2015 2229   PROTEINUR 30 12/30/2015 1429   PROTEINUR NEGATIVE 10/19/2015 2229   UROBILINOGEN 1.0 12/30/2015 1429   UROBILINOGEN 1.0 11/18/2014 1334   NITRITE negative 12/30/2015 1429   NITRITE NEGATIVE 10/19/2015 2229   LEUKOCYTESUR Negative 12/30/2015 1429     Felesia Stahlecker M.D. [facility] 05/21/2016, 10:09 AM  Pager: [phone] Between 7am to 7pm - call Pager - [phone]  After 7pm go to [email or web address] - password [email or web address]  Call night coverage person covering after 7pm

## 2016-05-21 NOTE — Progress Notes (Signed)
Notified of mildly elevated trop of 0.06, up from 0.04 on last draw. Chart reviewed. Patient denies any chest pain and SOB, continue observation for now. Essentially no change, still quite likely to be demand ischemia. Full progress note to follow later.  Ramond DialSigned, Atilano Covelli PA Pager: [phone]

## 2016-05-21 NOTE — Progress Notes (Signed)
Orthopedic Tech Progress Note Patient Details:  Adalberto ColeCarl Perotti [birth year] [id number] OHF w/ Trapeze applied. Patient ID: Adalberto ColeCarl Edmonds, male   DOB: [birth year], 50 y.o.   MRN: [id number]   Clois Dupesvery S Jodee Wagenaar 05/21/2016, 9:30 PM

## 2016-05-21 NOTE — Consult Note (Signed)
Reason for Consult:  Left hip fracture Referring Physician: EDP and [facility]  [name] Short is an 50 y.o. male.  HPI: Mr. Featherly is a 50 year old male patient who sustained a mechanical fall yesterday. He has a history of a previous stroke with left sided weakness. He does ambulate with a cane. He was transported to the emergency room where he was found to have a displaced left hip femoral neck fracture. Due to possible cardiac issues he was admitted to the medicine service and a cardiac consult was obtained. I have reviewed his x-rays and feel that he would benefit best from a hip replacement and have discussed this with him. He does report significant left hip pain. He does have a complicated medical history with cardiomyopathy. He is on Eliquis as well. He is awaiting a limited cardiac echo today. Given the fact that he has been on his blood thinning medication up until yesterday morning as well as the need for cardiac clearance prior to surgery, I will hold off on surgery until tomorrow and he fully understands this.  Past Medical History:  Diagnosis Date  . Cardiomyopathy ([place])    a. 10/2014 Echo: EF 35-40%, diff HK, sev LVH, mod dil LA, PASP 65mHg.;  b.  Echo 7/16: Severe LVH, EF 55-60%, aortic sclerosis without stenosis, mild AI, trivial MR, severe BAE  . Depression   . HIV infection ([facility] April 2017  . Hypertension    a. Previously on meds but none in several years.  [place]  . Smoker    a. Previous tobacco - quit ~ 2013. Ongoing daily marijuana usage. Occasional cigar.  . Stroke ([facility]    a. 10/2014 MRI Head: acute mod size nonhemorrhagic R ant cerebral artery distribution infarct involving the medial aspect of the right frontal and parietal lobe and right aspect of the corpus callosum w/ local mass effect-->TPA;  b. 10/2014 MRA: Abrupt cut off of the R ACA A2 segment;  b. 10/2014 Carotid U/S: 1-39% bilat ICA stenosis.  . Tuberculosis    "took RX for 163yrn  the 1990s" (10/19/2015)    Past Surgical History:  Procedure Laterality Date  . FRACTURE SURGERY    . MULTIPLE TOOTH EXTRACTIONS    . NASAL FRACTURE SURGERY  1983  . TEE WITHOUT CARDIOVERSION N/A 11/03/2014   Procedure: TRANSESOPHAGEAL ECHOCARDIOGRAM (TEE);  Surgeon: DaLarey DresserMD;  [facility];  Service: Cardiovascular;  Laterality: N/A;    Family History  Problem Relation Age of Onset  . Diabetes Mother   . Heart disease Father   . Heart attack Father   . Hypertension Father   . Other      no premature cad  . Stroke Sister   . Hypertension Brother   . Hypertension Sister     Social History:  reports that he has quit smoking. His smoking use included Cigars. He quit after 34.00 years of use. He has never used smokeless tobacco. He reports that he drinks alcohol. He reports that he uses drugs, including Marijuana.  Allergies: No Known Allergies  Medications: I have reviewed the patient's current medications.  Results for orders placed or performed during the hospital encounter of 05/20/16 (from the past 48 hour(s))  Basic metabolic panel     Status: Abnormal   Collection Time: 05/20/16  6:09 PM  Result Value Ref Range   Sodium 138 135 - 145 mmol/L   Potassium 4.3 3.5 - 5.1 mmol/L   Chloride 109 101 -  111 mmol/L   CO2 25 22 - 32 mmol/L   Glucose, Bld 112 (H) 65 - 99 mg/dL   BUN 14 6 - 20 mg/dL   Creatinine, Ser 1.51 (H) 0.61 - 1.24 mg/dL   Calcium 9.0 8.9 - 10.3 mg/dL   GFR calc non Af Amer 53 (L) >60 mL/min   GFR calc Af Amer >60 >60 mL/min    Comment: (NOTE) The eGFR has been calculated using the CKD EPI equation. This calculation has not been validated in all clinical situations. eGFR's persistently <60 mL/min signify possible Chronic Kidney Disease.    Anion gap 4 (L) 5 - 15  CBC with Differential     Status: Abnormal   Collection Time: 05/20/16  6:09 PM  Result Value Ref Range   WBC 5.3 4.0 - 10.5 K/uL   RBC 4.16 (L) 4.22 - 5.81 MIL/uL    Hemoglobin 13.6 13.0 - 17.0 g/dL   HCT 41.3 39.0 - 52.0 %   MCV 99.3 78.0 - 100.0 fL   MCH 32.7 26.0 - 34.0 pg   MCHC 32.9 30.0 - 36.0 g/dL   RDW 13.0 11.5 - 15.5 %   Platelets 124 (L) 150 - 400 K/uL   Neutrophils Relative % 59 %   Neutro Abs 3.2 1.7 - 7.7 K/uL   Lymphocytes Relative 22 %   Lymphs Abs 1.2 0.7 - 4.0 K/uL   Monocytes Relative 8 %   Monocytes Absolute 0.4 0.1 - 1.0 K/uL   Eosinophils Relative 10 %   Eosinophils Absolute 0.5 0.0 - 0.7 K/uL   Basophils Relative 1 %   Basophils Absolute 0.0 0.0 - 0.1 K/uL  Troponin I     Status: Abnormal   Collection Time: 05/20/16  6:09 PM  Result Value Ref Range   Troponin I 0.04 (HH) <0.03 ng/mL    Comment: CRITICAL RESULT CALLED TO, READ BACK BY AND VERIFIED WITH: WARD,C AT 7:30PM ON 05/20/16 BY FESTERMAN,C   Type and screen [place]     Status: None   Collection Time: 05/21/16 12:37 AM  Result Value Ref Range   ABO/RH(D) B POS    Antibody Screen NEG    Sample Expiration 05/24/2016   ABO/Rh     Status: None (Preliminary result)   Collection Time: 05/21/16 12:37 AM  Result Value Ref Range   ABO/RH(D) B POS   CBC     Status: Abnormal   Collection Time: 05/21/16  4:56 AM  Result Value Ref Range   WBC 9.8 4.0 - 10.5 K/uL   RBC 4.38 4.22 - 5.81 MIL/uL   Hemoglobin 14.2 13.0 - 17.0 g/dL   HCT 43.0 39.0 - 52.0 %   MCV 98.2 78.0 - 100.0 fL   MCH 32.4 26.0 - 34.0 pg   MCHC 33.0 30.0 - 36.0 g/dL   RDW 13.0 11.5 - 15.5 %   Platelets 122 (L) 150 - 400 K/uL  Basic metabolic panel     Status: Abnormal   Collection Time: 05/21/16  4:56 AM  Result Value Ref Range   Sodium 136 135 - 145 mmol/L   Potassium 4.5 3.5 - 5.1 mmol/L   Chloride 105 101 - 111 mmol/L   CO2 25 22 - 32 mmol/L   Glucose, Bld 98 65 - 99 mg/dL   BUN 14 6 - 20 mg/dL   Creatinine, Ser 1.39 (H) 0.61 - 1.24 mg/dL   Calcium 9.1 8.9 - 10.3 mg/dL   GFR calc non Af Amer 59 (  L) >60 mL/min   GFR calc Af Amer >60 >60 mL/min    Comment: (NOTE) The  eGFR has been calculated using the CKD EPI equation. This calculation has not been validated in all clinical situations. eGFR's persistently <60 mL/min signify possible Chronic Kidney Disease.    Anion gap 6 5 - 15  Troponin I     Status: Abnormal   Collection Time: 05/21/16  6:52 AM  Result Value Ref Range   Troponin I 0.06 (HH) <0.03 ng/mL    Comment: CRITICAL RESULT CALLED TO, READ BACK BY AND VERIFIED WITH: Leia Alf RN AT 1761 05/21/16 BY Karie Chimera     Dg Chest 1 View  Result Date: 05/20/2016 CLINICAL DATA:  Pt from home via EMS- Pt had CVA in 2016 and ambulates with cane. Pt missed a ledge walking onto a porch and fell. Pt c/o L hip pain. Pt is unable to bear weight on L hip and unable to move L extremity. Pt does have deficits to L side from previous CVA EXAM: CHEST 1 VIEW COMPARISON:  Chest x-ray dated 10/19/2015. FINDINGS: Cardiomegaly appears stable. Lungs are clear. No pleural effusion or pneumothorax seen. Osseous structures about the chest are unremarkable. IMPRESSION: No active disease.  Stable cardiomegaly. Electronically Signed   By: Franki Cabot M.D.   On: 05/20/2016 19:12   Dg Hip Unilat With Pelvis 2-3 Views Left  Result Date: 05/20/2016 CLINICAL DATA:  Pt from home via EMS- Pt had CVA in 2016 and ambulates with cane. Pt missed a ledge walking onto a porch and fell. Pt c/o L hip pain. Pt is unable to bear weight on L hip and unable to move L extremity. Pt does have deficits to L side from previous CVA EXAM: DG HIP (WITH OR WITHOUT PELVIS) 2-3V LEFT COMPARISON:  None. FINDINGS: There is a displaced fracture within the subcapital region of the left femoral neck, with prominent angulation deformity approaching 90 degrees. Left femoral head appears to remain well-positioned relative to the acetabulum. No additional fracture or dislocation seen. Soft tissues about the pelvis and left hip are unremarkable. IMPRESSION: Displaced fracture within the left femoral neck, subcapital  region, with associated prominent angulation deformity. Electronically Signed   By: Franki Cabot M.D.   On: 05/20/2016 19:14    ROS Blood pressure (!) 150/108, pulse 82, temperature 99.8 F (37.7 C), temperature source Oral, resp. rate 17, height _0  (1.778 m), weight 195 lb (88.5 kg), SpO2 93 %. Physical Exam  Constitutional: He is oriented to person, place, and time. He appears well-developed and well-nourished.  HENT:  Head: Normocephalic and atraumatic.  Neck: Normal range of motion. Neck supple.  Cardiovascular: Normal rate.  An irregular rhythm present.  Respiratory: Effort normal and breath sounds normal.  GI: Soft. Bowel sounds are normal.  Musculoskeletal:       Left hip: He exhibits decreased range of motion, decreased strength, tenderness, bony tenderness and deformity.  Neurological: He is alert and oriented to person, place, and time.  Skin: Skin is warm and dry.  Psychiatric: He has a normal mood and affect.   His left leg is shortened and externally rotated. He does have significant foot /ankle weakness.   Assessment/Plan: Displaced left hip femoral neck fracture 1)  I spoke with the patient in detail about his left hip fracture.  Given the displaced nature of his fracture combined with his residual weakness from his past stroke, he would benefit more from a left total hip replacement.  I  explained to him this as well as the risks and benefits involved.  Given that he has been on Eliquis combined with the need for Cardiac clearance, will hold on surgery today.  I plan on putting him on the OR schedule for tomorrow (Monday 10/30) at the end of the day.  I will order a diet for today and then allow him to have a light breakfast tomorrow.  Will make NPO after 8:00 am tomorrow.  Mcarthur Rossetti 05/21/2016, 8:20 AM

## 2016-05-22 ENCOUNTER — Encounter (HOSPITAL_COMMUNITY)
Admission: EM | Disposition: A | Discharge status: Home / Self Care | Payer: Self-pay | Source: Home / Self Care | Attending: Internal Medicine

## 2016-05-22 ENCOUNTER — Inpatient Hospital Stay (HOSPITAL_COMMUNITY): Payer: Medicaid Other

## 2016-05-22 ENCOUNTER — Inpatient Hospital Stay (HOSPITAL_COMMUNITY): Payer: Medicaid Other | Admitting: Certified Registered"

## 2016-05-22 ENCOUNTER — Encounter (HOSPITAL_COMMUNITY): Payer: Self-pay | Admitting: Certified Registered"

## 2016-05-22 DIAGNOSIS — I509 Heart failure, unspecified: Secondary | ICD-10-CM

## 2016-05-22 DIAGNOSIS — M1612 Unilateral primary osteoarthritis, left hip: Secondary | ICD-10-CM

## 2016-05-22 HISTORY — PX: TOTAL HIP ARTHROPLASTY: SHX124

## 2016-05-22 LAB — ECHOCARDIOGRAM LIMITED
Height: 70 in
WEIGHTICAEL: 3120 [oz_av]

## 2016-05-22 LAB — SURGICAL PCR SCREEN
MRSA, PCR: NEGATIVE
Staphylococcus aureus: NEGATIVE

## 2016-05-22 LAB — BASIC METABOLIC PANEL
ANION GAP: 7 (ref 5–15)
BUN: 14 mg/dL (ref 6–20)
CHLORIDE: 107 mmol/L (ref 101–111)
CO2: 21 mmol/L — ABNORMAL LOW (ref 22–32)
Calcium: 8.7 mg/dL — ABNORMAL LOW (ref 8.9–10.3)
Creatinine, Ser: 1.29 mg/dL — ABNORMAL HIGH (ref 0.61–1.24)
GFR calc Af Amer: >60 mL/min (ref >60)
GFR calc non Af Amer: >60 mL/min (ref >60)
Glucose, Bld: 88 mg/dL (ref 65–99)
POTASSIUM: 3.8 mmol/L (ref 3.5–5.1)
SODIUM: 135 mmol/L (ref 135–145)

## 2016-05-22 LAB — CBC
HCT: 40 % (ref 39.0–52.0)
HEMOGLOBIN: 13.2 g/dL (ref 13.0–17.0)
MCH: 32.1 pg (ref 26.0–34.0)
MCHC: 33 g/dL (ref 30.0–36.0)
MCV: 97.3 fL (ref 78.0–100.0)
Platelets: 111 10*3/uL — ABNORMAL LOW (ref 150–400)
RBC: 4.11 MIL/uL — ABNORMAL LOW (ref 4.22–5.81)
RDW: 13 % (ref 11.5–15.5)
WBC: 6.6 10*3/uL (ref 4.0–10.5)

## 2016-05-22 SURGERY — ARTHROPLASTY, HIP, TOTAL, ANTERIOR APPROACH
Anesthesia: General | Site: Hip | Laterality: Left

## 2016-05-22 MED ORDER — METHOCARBAMOL 500 MG PO TABS
500.0000 mg | ORAL_TABLET | Freq: Four times a day (QID) | ORAL | Status: DC | PRN
  Administered 2016-05-23 (×2): 500 mg via ORAL

## 2016-05-22 MED ORDER — MIDAZOLAM HCL 5 MG/5ML IJ SOLN
INTRAMUSCULAR | Status: DC | PRN
  Administered 2016-05-22: 2 mg via INTRAVENOUS

## 2016-05-22 MED ORDER — SUGAMMADEX SODIUM 200 MG/2ML IV SOLN
INTRAVENOUS | Status: DC | PRN
  Administered 2016-05-22: 200 mg via INTRAVENOUS

## 2016-05-22 MED ORDER — ROCURONIUM BROMIDE 10 MG/ML (PF) SYRINGE
PREFILLED_SYRINGE | INTRAVENOUS | Status: AC
  Filled 2016-05-22: qty 10

## 2016-05-22 MED ORDER — FENTANYL CITRATE (PF) 100 MCG/2ML IJ SOLN
INTRAMUSCULAR | Status: AC
  Filled 2016-05-22: qty 4

## 2016-05-22 MED ORDER — HYDRALAZINE HCL 20 MG/ML IJ SOLN: 10.0000 mg | Freq: Four times a day (QID) | INTRAMUSCULAR | Status: DC | PRN

## 2016-05-22 MED ORDER — FENTANYL CITRATE (PF) 100 MCG/2ML IJ SOLN
INTRAMUSCULAR | Status: DC | PRN
  Administered 2016-05-22 (×2): 100 ug via INTRAVENOUS

## 2016-05-22 MED ORDER — LABETALOL HCL 5 MG/ML IV SOLN
INTRAVENOUS | Status: AC
  Filled 2016-05-22: qty 4

## 2016-05-22 MED ORDER — PROMETHAZINE HCL 25 MG/ML IJ SOLN: 6.2500 mg | INTRAMUSCULAR | Status: DC | PRN

## 2016-05-22 MED ORDER — PROPOFOL 10 MG/ML IV BOLUS
INTRAVENOUS | Status: DC | PRN
  Administered 2016-05-22: 100 mg via INTRAVENOUS

## 2016-05-22 MED ORDER — ALUM & MAG HYDROXIDE-SIMETH 200-200-20 MG/5ML PO SUSP: 30.0000 mL | ORAL | Status: DC | PRN

## 2016-05-22 MED ORDER — 0.9 % SODIUM CHLORIDE (POUR BTL) OPTIME
TOPICAL | Status: DC | PRN
  Administered 2016-05-22: 1000 mL

## 2016-05-22 MED ORDER — CEFAZOLIN SODIUM-DEXTROSE 2-4 GM/100ML-% IV SOLN
2.0000 g | Freq: Four times a day (QID) | INTRAVENOUS | Status: AC
  Administered 2016-05-22 – 2016-05-23 (×2): 2 g via INTRAVENOUS
  Filled 2016-05-22 (×2): qty 100

## 2016-05-22 MED ORDER — PHENOL 1.4 % MT LIQD: 1.0000 | OROMUCOSAL | Status: DC | PRN

## 2016-05-22 MED ORDER — POVIDONE-IODINE 10 % EX SWAB: 2.0000 "application " | Freq: Once | CUTANEOUS | Status: DC

## 2016-05-22 MED ORDER — LABETALOL HCL 5 MG/ML IV SOLN
INTRAVENOUS | Status: DC | PRN
  Administered 2016-05-22 (×2): 10 mg via INTRAVENOUS

## 2016-05-22 MED ORDER — BUPIVACAINE LIPOSOME 1.3 % IJ SUSP
20.0000 mL | INTRAMUSCULAR | Status: DC
  Filled 2016-05-22: qty 20

## 2016-05-22 MED ORDER — METOCLOPRAMIDE HCL 5 MG/ML IJ SOLN: 5.0000 mg | Freq: Three times a day (TID) | INTRAMUSCULAR | Status: DC | PRN

## 2016-05-22 MED ORDER — CLONIDINE HCL 0.1 MG PO TABS
0.1000 mg | ORAL_TABLET | Freq: Once | ORAL | Status: AC
  Administered 2016-05-22: 0.1 mg via ORAL
  Filled 2016-05-22: qty 1

## 2016-05-22 MED ORDER — APIXABAN 2.5 MG PO TABS
2.5000 mg | ORAL_TABLET | Freq: Two times a day (BID) | ORAL | Status: DC
Start: 2016-05-23 — End: 2016-05-25
  Administered 2016-05-23 – 2016-05-25 (×5): 2.5 mg via ORAL
  Filled 2016-05-22 (×5): qty 1

## 2016-05-22 MED ORDER — LIDOCAINE 2% (20 MG/ML) 5 ML SYRINGE
INTRAMUSCULAR | Status: DC | PRN
  Administered 2016-05-22: 100 mg via INTRAVENOUS

## 2016-05-22 MED ORDER — HYDRALAZINE HCL 20 MG/ML IJ SOLN
INTRAMUSCULAR | Status: DC | PRN
  Administered 2016-05-22 (×2): 10 mg via INTRAVENOUS

## 2016-05-22 MED ORDER — FENTANYL CITRATE (PF) 100 MCG/2ML IJ SOLN
INTRAMUSCULAR | Status: AC
  Filled 2016-05-22: qty 2

## 2016-05-22 MED ORDER — HYDROMORPHONE HCL 2 MG/ML IJ SOLN: 1.0000 mg | INTRAMUSCULAR | Status: DC | PRN

## 2016-05-22 MED ORDER — MIDAZOLAM HCL 2 MG/2ML IJ SOLN
INTRAMUSCULAR | Status: AC
  Filled 2016-05-22: qty 2

## 2016-05-22 MED ORDER — DOCUSATE SODIUM 100 MG PO CAPS
100.0000 mg | ORAL_CAPSULE | Freq: Two times a day (BID) | ORAL | Status: DC
  Administered 2016-05-22 – 2016-05-25 (×6): 100 mg via ORAL
  Filled 2016-05-22 (×7): qty 1

## 2016-05-22 MED ORDER — HYDROCODONE-ACETAMINOPHEN 5-325 MG PO TABS
1.0000 | ORAL_TABLET | Freq: Four times a day (QID) | ORAL | Status: DC | PRN
  Administered 2016-05-22 – 2016-05-25 (×7): 2 via ORAL
  Filled 2016-05-22 (×8): qty 2

## 2016-05-22 MED ORDER — OXYCODONE HCL 5 MG PO TABS
5.0000 mg | ORAL_TABLET | ORAL | Status: DC | PRN
  Administered 2016-05-23: 10 mg via ORAL
  Filled 2016-05-22: qty 2

## 2016-05-22 MED ORDER — SODIUM CHLORIDE 0.9 % IV SOLN
INTRAVENOUS | Status: DC
  Administered 2016-05-22: 23:00:00 via INTRAVENOUS

## 2016-05-22 MED ORDER — FENTANYL CITRATE (PF) 100 MCG/2ML IJ SOLN
25.0000 ug | INTRAMUSCULAR | Status: DC | PRN
  Administered 2016-05-22: 50 ug via INTRAVENOUS

## 2016-05-22 MED ORDER — ACETAMINOPHEN 650 MG RE SUPP: 650.0000 mg | Freq: Four times a day (QID) | RECTAL | Status: DC | PRN

## 2016-05-22 MED ORDER — SODIUM CHLORIDE 0.9 % IR SOLN
Status: DC | PRN
  Administered 2016-05-22: 3000 mL

## 2016-05-22 MED ORDER — PHENYLEPHRINE 40 MCG/ML (10ML) SYRINGE FOR IV PUSH (FOR BLOOD PRESSURE SUPPORT)
PREFILLED_SYRINGE | INTRAVENOUS | Status: AC
  Filled 2016-05-22: qty 20

## 2016-05-22 MED ORDER — ACETAMINOPHEN 325 MG PO TABS: 650.0000 mg | ORAL_TABLET | Freq: Four times a day (QID) | ORAL | Status: DC | PRN

## 2016-05-22 MED ORDER — CEFAZOLIN SODIUM-DEXTROSE 2-4 GM/100ML-% IV SOLN
2.0000 g | INTRAVENOUS | Status: AC
  Administered 2016-05-22: 2 g via INTRAVENOUS
  Filled 2016-05-22: qty 100

## 2016-05-22 MED ORDER — HYDRALAZINE HCL 25 MG PO TABS
25.0000 mg | ORAL_TABLET | Freq: Three times a day (TID) | ORAL | Status: DC
  Administered 2016-05-22 – 2016-05-23 (×2): 25 mg via ORAL
  Filled 2016-05-22 (×2): qty 1

## 2016-05-22 MED ORDER — ROCURONIUM BROMIDE 10 MG/ML (PF) SYRINGE
PREFILLED_SYRINGE | INTRAVENOUS | Status: DC | PRN
  Administered 2016-05-22: 50 mg via INTRAVENOUS
  Administered 2016-05-22: 10 mg via INTRAVENOUS

## 2016-05-22 MED ORDER — LIDOCAINE 2% (20 MG/ML) 5 ML SYRINGE
INTRAMUSCULAR | Status: AC
  Filled 2016-05-22: qty 5

## 2016-05-22 MED ORDER — DEXAMETHASONE SODIUM PHOSPHATE 10 MG/ML IJ SOLN
INTRAMUSCULAR | Status: DC | PRN
  Administered 2016-05-22: 10 mg via INTRAVENOUS

## 2016-05-22 MED ORDER — METHOCARBAMOL 1000 MG/10ML IJ SOLN
500.0000 mg | Freq: Four times a day (QID) | INTRAVENOUS | Status: DC | PRN
  Filled 2016-05-22: qty 5

## 2016-05-22 MED ORDER — ONDANSETRON HCL 4 MG/2ML IJ SOLN: 4.0000 mg | Freq: Four times a day (QID) | INTRAMUSCULAR | Status: DC | PRN

## 2016-05-22 MED ORDER — ONDANSETRON HCL 4 MG PO TABS: 4.0000 mg | ORAL_TABLET | Freq: Four times a day (QID) | ORAL | Status: DC | PRN

## 2016-05-22 MED ORDER — ONDANSETRON HCL 4 MG/2ML IJ SOLN
INTRAMUSCULAR | Status: DC | PRN
  Administered 2016-05-22: 4 mg via INTRAVENOUS

## 2016-05-22 MED ORDER — METOCLOPRAMIDE HCL 5 MG PO TABS: 5.0000 mg | ORAL_TABLET | Freq: Three times a day (TID) | ORAL | Status: DC | PRN

## 2016-05-22 MED ORDER — CHLORHEXIDINE GLUCONATE 4 % EX LIQD
60.0000 mL | Freq: Once | CUTANEOUS | Status: DC
  Filled 2016-05-22: qty 15

## 2016-05-22 MED ORDER — MENTHOL 3 MG MT LOZG
1.0000 | LOZENGE | OROMUCOSAL | Status: DC | PRN
  Filled 2016-05-22: qty 9

## 2016-05-22 MED ORDER — HYDRALAZINE HCL 20 MG/ML IJ SOLN
INTRAMUSCULAR | Status: AC
  Filled 2016-05-22: qty 1

## 2016-05-22 MED ORDER — LACTATED RINGERS IV SOLN
INTRAVENOUS | Status: DC
  Administered 2016-05-22: 10 mL/h via INTRAVENOUS
  Administered 2016-05-22: 19:00:00 via INTRAVENOUS

## 2016-05-22 MED ORDER — PROPOFOL 10 MG/ML IV BOLUS
INTRAVENOUS | Status: AC
  Filled 2016-05-22: qty 20

## 2016-05-22 SURGICAL SUPPLY — 51 items
BENZOIN TINCTURE PRP APPL 2/3 (GAUZE/BANDAGES/DRESSINGS) ×3 IMPLANT
BLADE SAW SGTL 18X1.27X75 (BLADE) ×2 IMPLANT
BLADE SAW SGTL 18X1.27X75MM (BLADE) ×1
BLADE SURG ROTATE 9660 (MISCELLANEOUS) IMPLANT
CAPT HIP TOTAL 2 ×3 IMPLANT
CELLS DAT CNTRL 66122 CELL SVR (MISCELLANEOUS) ×1 IMPLANT
CLOSURE WOUND 1/2 X4 (GAUZE/BANDAGES/DRESSINGS) ×2
COVER SURGICAL LIGHT HANDLE (MISCELLANEOUS) ×3 IMPLANT
DRAPE C-ARM 42X72 X-RAY (DRAPES) ×3 IMPLANT
DRAPE STERI IOBAN 125X83 (DRAPES) ×3 IMPLANT
DRAPE U-SHAPE 47X51 STRL (DRAPES) ×9 IMPLANT
DRSG AQUACEL AG ADV 3.5X10 (GAUZE/BANDAGES/DRESSINGS) ×3 IMPLANT
DURAPREP 26ML APPLICATOR (WOUND CARE) ×3 IMPLANT
ELECT BLADE 4.0 EZ CLEAN MEGAD (MISCELLANEOUS) ×3
ELECT BLADE 6.5 EXT (BLADE) IMPLANT
ELECT REM PT RETURN 9FT ADLT (ELECTROSURGICAL) ×3
ELECTRODE BLDE 4.0 EZ CLN MEGD (MISCELLANEOUS) ×1 IMPLANT
ELECTRODE REM PT RTRN 9FT ADLT (ELECTROSURGICAL) ×1 IMPLANT
FACESHIELD WRAPAROUND (MASK) ×6 IMPLANT
GAUZE XEROFORM 5X9 LF (GAUZE/BANDAGES/DRESSINGS) ×3 IMPLANT
GLOVE BIOGEL PI IND STRL 8 (GLOVE) ×2 IMPLANT
GLOVE BIOGEL PI INDICATOR 8 (GLOVE) ×4
GLOVE ECLIPSE 8.0 STRL XLNG CF (GLOVE) ×3 IMPLANT
GLOVE ORTHO TXT STRL SZ7.5 (GLOVE) ×6 IMPLANT
GOWN STRL REUS W/ TWL LRG LVL3 (GOWN DISPOSABLE) ×2 IMPLANT
GOWN STRL REUS W/ TWL XL LVL3 (GOWN DISPOSABLE) ×2 IMPLANT
GOWN STRL REUS W/TWL LRG LVL3 (GOWN DISPOSABLE) ×4
GOWN STRL REUS W/TWL XL LVL3 (GOWN DISPOSABLE) ×4
HANDPIECE INTERPULSE COAX TIP (DISPOSABLE) ×2
KIT BASIN OR (CUSTOM PROCEDURE TRAY) ×3 IMPLANT
KIT ROOM TURNOVER OR (KITS) ×3 IMPLANT
MANIFOLD NEPTUNE II (INSTRUMENTS) ×3 IMPLANT
NS IRRIG 1000ML POUR BTL (IV SOLUTION) ×3 IMPLANT
PACK TOTAL JOINT (CUSTOM PROCEDURE TRAY) ×3 IMPLANT
PAD ARMBOARD 7.5X6 YLW CONV (MISCELLANEOUS) ×3 IMPLANT
RTRCTR WOUND ALEXIS 18CM MED (MISCELLANEOUS) ×3
SET HNDPC FAN SPRY TIP SCT (DISPOSABLE) ×1 IMPLANT
STAPLER VISISTAT 35W (STAPLE) IMPLANT
STRIP CLOSURE SKIN 1/2X4 (GAUZE/BANDAGES/DRESSINGS) ×4 IMPLANT
SUT ETHIBOND NAB CT1 #1 30IN (SUTURE) ×3 IMPLANT
SUT MNCRL AB 4-0 PS2 18 (SUTURE) IMPLANT
SUT VIC AB 0 CT1 27 (SUTURE) ×2
SUT VIC AB 0 CT1 27XBRD ANBCTR (SUTURE) ×1 IMPLANT
SUT VIC AB 1 CT1 27 (SUTURE) ×2
SUT VIC AB 1 CT1 27XBRD ANBCTR (SUTURE) ×1 IMPLANT
SUT VIC AB 2-0 CT1 27 (SUTURE) ×2
SUT VIC AB 2-0 CT1 TAPERPNT 27 (SUTURE) ×1 IMPLANT
TOWEL OR 17X24 6PK STRL BLUE (TOWEL DISPOSABLE) ×3 IMPLANT
TOWEL OR 17X26 10 PK STRL BLUE (TOWEL DISPOSABLE) ×3 IMPLANT
TRAY FOLEY CATH 16FRSI W/METER (SET/KITS/TRAYS/PACK) IMPLANT
WATER STERILE IRR 1000ML POUR (IV SOLUTION) ×6 IMPLANT

## 2016-05-22 NOTE — Anesthesia Preprocedure Evaluation (Addendum)
Anesthesia Evaluation  Patient identified by MRN, date of birth, ID band Patient awake    Reviewed: Allergy & Precautions, NPO status , Patient's Chart, lab work & pertinent test results, reviewed documented beta blocker date and time   Airway Mallampati: II  TM Distance: >3 FB Neck ROM: Full    Dental  (+) Teeth Intact, Dental Advisory Given   Pulmonary neg pulmonary ROS, former smoker,    Pulmonary exam normal breath sounds clear to auscultation       Cardiovascular hypertension, Pt. on home beta blockers and Pt. on medications Normal cardiovascular exam+ dysrhythmias Atrial Fibrillation  Rhythm:Regular Rate:Normal  Echo 05/22/16: Study Conclusions  - Left ventricle: The cavity size was mildly dilated. Wall   thickness was increased in a pattern of severe LVH. Systolic function was moderately reduced. The estimated ejection fraction was in the range of 35% to 40%. Moderate hypokinesis of the apicallateral myocardium. Akinesis of the basalinferior myocardium. - Aortic valve: There was mild regurgitation. - Mitral valve: There was mild regurgitation. - Left atrium: The atrium was mildly to moderately dilated. - Right atrium: The atrium was mildly to moderately dilated.   Neuro/Psych Seizures -,  PSYCHIATRIC DISORDERS Depression CVA, Residual Symptoms    GI/Hepatic negative GI ROS, Neg liver ROS,   Endo/Other  negative endocrine ROS  Renal/GU Renal InsufficiencyRenal disease     Musculoskeletal negative musculoskeletal ROS (+)   Abdominal   Peds  Hematology  (+) Blood dyscrasia (Eliquis), , HIV, Thrombocytopenia--plt 111k   Anesthesia Other Findings Day of surgery medications reviewed with the patient.  Reproductive/Obstetrics                           Anesthesia Physical Anesthesia Plan  ASA: III  Anesthesia Plan: General   Post-op Pain Management:    Induction: Intravenous  Airway  Management Planned: Oral ETT  Additional Equipment:   Intra-op Plan:   Post-operative Plan: Extubation in OR  Informed Consent: I have reviewed the patients History and Physical, chart, labs and discussed the procedure including the risks, benefits and alternatives for the proposed anesthesia with the patient or authorized representative who has indicated his/her understanding and acceptance.   Dental advisory given  Plan Discussed with: CRNA  Anesthesia Plan Comments: (Risks/benefits of general anesthesia discussed with patient including risk of damage to teeth, lips, gum, and tongue, nausea/vomiting, allergic reactions to medications, and the possibility of heart attack, stroke and death.  All patient questions answered.  Patient wishes to proceed.)        Anesthesia Quick Evaluation

## 2016-05-22 NOTE — Anesthesia Postprocedure Evaluation (Signed)
Anesthesia Post Note  Patient: [name] Short  Procedure(s) Performed: Procedure(s) (LRB): TOTAL HIP ARTHROPLASTY ANTERIOR APPROACH (Left)  Patient location during evaluation: PACU Anesthesia Type: General Level of consciousness: awake and alert Pain management: pain level controlled Vital Signs Assessment: post-procedure vital signs reviewed and stable Respiratory status: spontaneous breathing, nonlabored ventilation, respiratory function stable and patient connected to nasal cannula oxygen Cardiovascular status: blood pressure returned to baseline and stable Postop Assessment: no signs of nausea or vomiting Anesthetic complications: no    Last Vitals:  Vitals:   05/22/16 1945 05/22/16 2000  BP: (!) 89/65 108/80  Pulse: 98 89  Resp: (!) 25 16  Temp:      Last Pain:  Vitals:   05/22/16 2000  TempSrc:   PainSc: 5                  Jocelynn Gioffre S

## 2016-05-22 NOTE — Anesthesia Procedure Notes (Signed)
Procedure Name: Intubation Date/Time: 05/22/2016 5:34 PM Performed by: Charm BargesBUTLER, Kolbee Stallman R Pre-anesthesia Checklist: Patient identified, Emergency Drugs available, Suction available and Patient being monitored Patient Re-evaluated:Patient Re-evaluated prior to inductionOxygen Delivery Method: Circle System Utilized Preoxygenation: Pre-oxygenation with 100% oxygen Intubation Type: IV induction Ventilation: Mask ventilation without difficulty Laryngoscope Size: Mac and 4 Grade View: Grade I Tube type: Oral Number of attempts: 1 Airway Equipment and Method: Stylet Placement Confirmation: ETT inserted through vocal cords under direct vision,  positive ETCO2 and breath sounds checked- equal and bilateral Secured at: 22 cm Tube secured with: Tape Dental Injury: Teeth and Oropharynx as per pre-operative assessment

## 2016-05-22 NOTE — Progress Notes (Signed)
Pt BP cont. High throughout the night. Hospitalitis notified. Medication provided X2 see MAR pt BP now 168/101. Down from previous 190/119.

## 2016-05-22 NOTE — Progress Notes (Signed)
Patient ID: [name] Short, male   DOB: 1965/11/28, 50 y.o.   MRN: [id number] Surgery planned for this evening on his left hip pending final clearance.  He understands this fully.

## 2016-05-22 NOTE — Progress Notes (Signed)
Echocardiogram 2D Echocardiogram has been performed.  [name] Short 05/22/2016, 10:05 AM

## 2016-05-22 NOTE — Transfer of Care (Signed)
Immediate Anesthesia Transfer of Care Note  Patient: [name] Short  Procedure(s) Performed: Procedure(s): TOTAL HIP ARTHROPLASTY ANTERIOR APPROACH (Left)  Patient Location: PACU  Anesthesia Type:General  Level of Consciousness: oriented, sedated, patient cooperative and responds to stimulation  Airway & Oxygen Therapy: Patient Spontanous Breathing and Patient connected to nasal cannula oxygen  Post-op Assessment: Report given to RN, Post -op Vital signs reviewed and stable and Patient moving all extremities X 4  Post vital signs: Reviewed and stable  Last Vitals:  Vitals:   05/22/16 1300 05/22/16 1920  BP: (!) 170/109 (P) 108/77  Pulse: 78   Resp: 18   Temp: 36.9 C (P) 36.9 C    Last Pain:  Vitals:   05/22/16 1300  TempSrc: Oral  PainSc:       Patients Stated Pain Goal: 2 (05/22/16 0500)  Complications: No apparent anesthesia complications

## 2016-05-22 NOTE — Progress Notes (Signed)
[facility]                                                                              Patient Demographics  [name] Short, is a 50 y.o. male, DOB - 1965/08/12, NWG:[id number]  Admit date - 05/20/2016   Admitting Physician Haydee Monica, MD  Outpatient Primary MD for the patient is [name], MD  Outpatient specialists:   LOS - 2  days    Chief Complaint  Patient presents with  . Fall  . Hip Pain       Brief summary    [name] Short is a 50 y.o. male with medical history significant of recent dx of HIV, CM, CVA with left sided weakness, HTN, afib comes in after falling down a couple of stairs and injuring his left leg.  No LOC.  Pt has an obvious deformity to his left femur.  After the fall he did experience some sob and chest pain which was resolved.   Pt found to have left femoral fracture which is displaced.    Assessment & Plan    Principal Problem:   Femur fracture, left (HCC) - Seen by cardiology, 2-D echo still pending - Currently no chest pain or shortness of breath, troponin slightly positive, per cardiology- demand ischemia - Surgery Plan today per orthopedics, continue to hold anticoagulants  Active Problems:  Uncontrolled hypertension - Likely worsened due to pain and anxiety - Continue lisinopril, Coreg, placed on oral hydralazine 25mg  q8hrs and IV as needed with parameters    Left hemiparesis (HCC) with history of CVA - Due to prior CVA, at baseline walks with cane, no new neurological deficits    Cardiomyopathy (HCC) - Currently no chest pain or shortness of breath, or active cardiac symptoms, 2-D echo pending    Atrial fibrillation (HCC) -Currently rate controlled, continue to hold eliquis     AIDS (acquired immunodeficiency syndrome), CD4 <=200 (HCC) - Restarted HAART meds     Fall (on) (from) other stairs and steps, sequela - PT evaluation after surgery. Patient states that if he needs rehabilitation, he will prefer  in [place].  Mild acute renal insufficiency  - Placed on gentle hydration, improving  Code Status: full  DVT Prophylaxis:  eliquis on hold  Family Communication: Discussed in detail with the patient, all imaging results, lab results explained to the patient    Disposition Plan:   Time Spent in minutes   25 minutes  Procedures:    Consultants:   Cardiology Orthopedics  Antimicrobials:      Medications  Scheduled Meds: . atorvastatin  20 mg Oral q1800  . azithromycin  1,200 mg Oral Weekly  . carvedilol  25 mg Oral BID WC  .  ceFAZolin (ANCEF) IV  2 g Intravenous To SSTC  . chlorhexidine  60 mL Topical Once  . DULoxetine  30 mg Oral Daily  . elvitegravir-cobicistat-emtricitabine-tenofovir  1 tablet Oral Q breakfast  . hydrALAZINE  25 mg Oral Q8H  . levETIRAcetam  1,000 mg Oral BID  . lisinopril  40 mg Oral Daily  . loratadine  10 mg Oral Daily  . povidone-iodine  2 application Topical Once  . sulfamethoxazole-trimethoprim  1 tablet Oral Once per day on Sun Tue Thu   Continuous Infusions: . sodium chloride 75 mL/hr at 05/21/16 1019   PRN Meds:.hydrALAZINE, HYDROcodone-acetaminophen, methocarbamol **OR** methocarbamol (ROBAXIN)  IV, morphine injection, nitroGLYCERIN   Antibiotics   Anti-infectives    Start     Dose/Rate Route Frequency Ordered Stop   05/22/16 0900  sulfamethoxazole-trimethoprim (BACTRIM,SEPTRA) 400-80 MG per tablet 1 tablet  Status:  Discontinued     1 tablet Oral Once per day on Mon Wed Fri 05/21/16 0858 05/21/16 1324   05/22/16 0800  ceFAZolin (ANCEF) IVPB 2g/100 mL premix     2 g 200 mL/hr over 30 Minutes Intravenous To Short Stay 05/22/16 0214 05/23/16 0800   05/21/16 1500  sulfamethoxazole-trimethoprim (BACTRIM,SEPTRA) 400-80 MG per tablet 1 tablet     1 tablet Oral Once per day on Sun Tue Thu 05/21/16 1324     05/21/16 1000  azithromycin (ZITHROMAX) tablet 1,200 mg     1,200 mg Oral Weekly 05/21/16 0858     05/21/16 1000   elvitegravir-cobicistat-emtricitabine-tenofovir (GENVOYA) 150-150-200-10 MG tablet 1 tablet     1 tablet Oral Daily with breakfast 05/21/16 0858          Subjective:   [name] Short was seen and examined today. Overnight elevated BP. Pain is controlled.  Patient denies dizziness, chest pain, shortness of breath, abdominal pain, N/V/D/C. No acute events overnight.    Objective:   Vitals:   05/21/16 2144 05/21/16 2200 05/22/16 0100 05/22/16 0410  BP: (!) 181/122 (!) 170/100 (!) 190/119 (!) 168/101  Pulse:    77  Resp:    16  Temp:    99.3 F (37.4 C)  TempSrc:    Oral  SpO2:    99%  Weight:      Height:        Intake/Output Summary (Last 24 hours) at 05/22/16 0912 Last data filed at 05/22/16 0410  Gross per 24 hour  Intake          1338.75 ml  Output             2600 ml  Net         -1261.25 ml     Wt Readings from Last 3 Encounters:  05/20/16 88.5 kg (195 lb)  01/20/16 77.8 kg (171 lb 9.6 oz)  01/07/16 73.5 kg (162 lb)     Exam  General: Alert and oriented x 3, NAD  HEENT:  PERRLA, EOMI  Neck: Supple, no JVD  Cardiovascular: S1 S2clear, RRR  Respiratory: Clear to auscultation bilaterally, no wheezing, rales or rhonchi  Gastrointestinal: Soft, nontender, nondistended, + bowel sounds  Ext: no cyanosis clubbing or edema  Neuro: left sided weakness chronic   Skin: No rashes  Psych: Normal affect and demeanor, alert and oriented x3    Data Reviewed:  I have personally reviewed following labs and imaging studies  Micro Results Recent Results (from the past 240 hour(s))  Surgical pcr screen     Status: None   Collection Time: 05/21/16 11:56 PM  Result Value Ref Range Status   MRSA, PCR NEGATIVE NEGATIVE Final   Staphylococcus aureus NEGATIVE NEGATIVE Final    Comment:        The Xpert SA Assay (FDA approved for NASAL specimens in patients over 48 years of age), is one component of a comprehensive surveillance program.  Test performance has been  validated by [facility] for patients greater than or equal  to 37 year old. It is not intended to diagnose infection nor to guide or monitor treatment.     Radiology Reports Dg Chest 1 View  Result Date: 05/20/2016 CLINICAL DATA:  Pt from home via EMS- Pt had CVA in 2016 and ambulates with cane. Pt missed a ledge walking onto a porch and fell. Pt c/o L hip pain. Pt is unable to bear weight on L hip and unable to move L extremity. Pt does have deficits to L side from previous CVA EXAM: CHEST 1 VIEW COMPARISON:  Chest x-ray dated 10/19/2015. FINDINGS: Cardiomegaly appears stable. Lungs are clear. No pleural effusion or pneumothorax seen. Osseous structures about the chest are unremarkable. IMPRESSION: No active disease.  Stable cardiomegaly. Electronically Signed   By: Bary Richard M.D.   On: 05/20/2016 19:12   Dg Hip Unilat With Pelvis 2-3 Views Left  Result Date: 05/20/2016 CLINICAL DATA:  Pt from home via EMS- Pt had CVA in 2016 and ambulates with cane. Pt missed a ledge walking onto a porch and fell. Pt c/o L hip pain. Pt is unable to bear weight on L hip and unable to move L extremity. Pt does have deficits to L side from previous CVA EXAM: DG HIP (WITH OR WITHOUT PELVIS) 2-3V LEFT COMPARISON:  None. FINDINGS: There is a displaced fracture within the subcapital region of the left femoral neck, with prominent angulation deformity approaching 90 degrees. Left femoral head appears to remain well-positioned relative to the acetabulum. No additional fracture or dislocation seen. Soft tissues about the pelvis and left hip are unremarkable. IMPRESSION: Displaced fracture within the left femoral neck, subcapital region, with associated prominent angulation deformity. Electronically Signed   By: Bary Richard M.D.   On: 05/20/2016 19:14    Lab Data:  CBC:  Recent Labs Lab 05/20/16 1809 05/21/16 0456 05/22/16 0502  WBC 5.3 9.8 6.6  NEUTROABS 3.2  --   --   HGB 13.6 14.2 13.2  HCT 41.3 43.0  40.0  MCV 99.3 98.2 97.3  PLT 124* 122* 111*   Basic Metabolic Panel:  Recent Labs Lab 05/20/16 1809 05/21/16 0456 05/22/16 0502  NA 138 136 135  K 4.3 4.5 3.8  CL 109 105 107  CO2 25 25 21*  GLUCOSE 112* 98 88  BUN 14 14 14   CREATININE 1.51* 1.39* 1.29*  CALCIUM 9.0 9.1 8.7*   GFR: Estimated Creatinine Clearance: 78.4 mL/min (by C-G formula based on SCr of 1.29 mg/dL (H)). Liver Function Tests: No results for input(s): AST, ALT, ALKPHOS, BILITOT, PROT, ALBUMIN in the last 168 hours. No results for input(s): LIPASE, AMYLASE in the last 168 hours. No results for input(s): AMMONIA in the last 168 hours. Coagulation Profile: No results for input(s): INR, PROTIME in the last 168 hours. Cardiac Enzymes:  Recent Labs Lab 05/20/16 1809 05/21/16 0652  TROPONINI 0.04* 0.06*   BNP (last 3 results) No results for input(s): PROBNP in the last 8760 hours. HbA1C: No results for input(s): HGBA1C in the last 72 hours. CBG: No results for input(s): GLUCAP in the last 168 hours. Lipid Profile: No results for input(s): CHOL, HDL, LDLCALC, TRIG, CHOLHDL, LDLDIRECT in the last 72 hours. Thyroid Function Tests: No results for input(s): TSH, T4TOTAL, FREET4, T3FREE, THYROIDAB in the last 72 hours. Anemia Panel: No results for input(s): VITAMINB12, FOLATE, FERRITIN, TIBC, IRON, RETICCTPCT in the last 72 hours. Urine analysis:    Component Value Date/Time   COLORURINE YELLOW 10/19/2015 2229   APPEARANCEUR CLEAR 10/19/2015 2229  LABSPEC 1.010 10/19/2015 2229   PHURINE 7.5 10/19/2015 2229   GLUCOSEU NEGATIVE 10/19/2015 2229   HGBUR SMALL (A) 10/19/2015 2229   BILIRUBINUR small 12/30/2015 1429   KETONESUR NEGATIVE 10/19/2015 2229   PROTEINUR 30 12/30/2015 1429   PROTEINUR NEGATIVE 10/19/2015 2229   UROBILINOGEN 1.0 12/30/2015 1429   UROBILINOGEN 1.0 11/18/2014 1334   NITRITE negative 12/30/2015 1429   NITRITE NEGATIVE 10/19/2015 2229   LEUKOCYTESUR Negative 12/30/2015 1429      Rob Mciver M.D. [facility] 05/22/2016, 9:12 AM  Pager: [phone] Between 7am to 7pm - call Pager - [phone]  After 7pm go to [email or web address] - password [email or web address]  Call night coverage person covering after 7pm

## 2016-05-22 NOTE — Brief Op Note (Signed)
05/20/2016 - 05/22/2016  6:58 PM  PATIENT:  [name] Short  50 y.o. male  PRE-OPERATIVE DIAGNOSIS:  LEFT HIP FRACTURE  POST-OPERATIVE DIAGNOSIS:  LEFT HIP FRACTURE  PROCEDURE:  Procedure(s): TOTAL HIP ARTHROPLASTY ANTERIOR APPROACH (Left)  SURGEON:  Surgeon(s) and Role:    * Kathryne Hitchhristopher Y Ulric Salzman, MD - Primary  PHYSICIAN ASSISTANT: Rexene EdisonGil Clark, PA-C  ANESTHESIA:   general  EBL:  Total I/O In: 1962.5 [I.V.:1962.5] Out: 1750 [Urine:1250; Blood:500]  COUNTS:  YES  DICTATION: .Other Dictation: Dictation Number [id number]  PLAN OF CARE: Admit to inpatient   PATIENT DISPOSITION:  PACU - hemodynamically stable.   Delay start of Pharmacological VTE agent (>24hrs) due to surgical blood loss or risk of bleeding: no

## 2016-05-23 ENCOUNTER — Encounter (HOSPITAL_COMMUNITY): Payer: Self-pay | Admitting: Orthopaedic Surgery

## 2016-05-23 DIAGNOSIS — D696 Thrombocytopenia, unspecified: Secondary | ICD-10-CM

## 2016-05-23 DIAGNOSIS — I482 Chronic atrial fibrillation: Secondary | ICD-10-CM

## 2016-05-23 LAB — CBC
HEMATOCRIT: 37.5 % — AB (ref 39.0–52.0)
HEMOGLOBIN: 12.3 g/dL — AB (ref 13.0–17.0)
MCH: 32.5 pg (ref 26.0–34.0)
MCHC: 32.8 g/dL (ref 30.0–36.0)
MCV: 98.9 fL (ref 78.0–100.0)
Platelets: 89 10*3/uL — ABNORMAL LOW (ref 150–400)
RBC: 3.79 MIL/uL — AB (ref 4.22–5.81)
RDW: 13.2 % (ref 11.5–15.5)
WBC: 7.3 10*3/uL (ref 4.0–10.5)

## 2016-05-23 LAB — BASIC METABOLIC PANEL
Anion gap: 6 (ref 5–15)
BUN: 15 mg/dL (ref 6–20)
CHLORIDE: 107 mmol/L (ref 101–111)
CO2: 22 mmol/L (ref 22–32)
CREATININE: 1.2 mg/dL (ref 0.61–1.24)
Calcium: 8.6 mg/dL — ABNORMAL LOW (ref 8.9–10.3)
GFR calc Af Amer: >60 mL/min (ref >60)
GFR calc non Af Amer: >60 mL/min (ref >60)
Glucose, Bld: 129 mg/dL — ABNORMAL HIGH (ref 65–99)
POTASSIUM: 4.2 mmol/L (ref 3.5–5.1)
SODIUM: 135 mmol/L (ref 135–145)

## 2016-05-23 MED ORDER — OXYCODONE-ACETAMINOPHEN 5-325 MG PO TABS: 1.0000 | ORAL_TABLET | ORAL | 0 refills | Status: DC | PRN

## 2016-05-23 MED ORDER — HYDRALAZINE HCL 25 MG PO TABS
25.0000 mg | ORAL_TABLET | Freq: Two times a day (BID) | ORAL | Status: DC
  Administered 2016-05-23 – 2016-05-24 (×2): 25 mg via ORAL
  Filled 2016-05-23 (×3): qty 1

## 2016-05-23 MED ORDER — POLYETHYLENE GLYCOL 3350 17 G PO PACK
17.0000 g | PACK | Freq: Every day | ORAL | Status: DC
  Administered 2016-05-23 – 2016-05-25 (×3): 17 g via ORAL
  Filled 2016-05-23 (×3): qty 1

## 2016-05-23 NOTE — Discharge Instructions (Addendum)
Increase activities as comfort allows. Use a cane or walker when up. Full weight bearing as tolerated. You can get your actual left hip dressing wet in the shower daily.  Someone from [facility] will contact you to set up start date for therapy. [phone]   Information on my medicine - ELIQUIS (apixaban)  This medication education was reviewed with me or my healthcare representative as part of my discharge preparation.  The pharmacist that spoke with me during my hospital stay was:  Lennon AlstromDang, Thuy Dien, [facility]  Why was Eliquis prescribed for you? Eliquis was prescribed for you to reduce the risk of a blood clot forming that can cause a stroke if you have a medical condition called atrial fibrillation (a type of irregular heartbeat).  And after surgery.  What do You need to know about Eliquis ? Take your Eliquis TWICE DAILY - one tablet in the morning and one tablet in the evening with or without food. If you have difficulty swallowing the tablet whole please discuss with your pharmacist how to take the medication safely.  Take Eliquis exactly as prescribed by your doctor and DO NOT stop taking Eliquis without talking to the doctor who prescribed the medication.  Stopping may increase your risk of developing a stroke.  Refill your prescription before you run out.  After discharge, you should have regular check-up appointments with your healthcare provider that is prescribing your Eliquis.  In the future your dose may need to be changed if your kidney function or weight changes by a significant amount or as you get older.  What do you do if you miss a dose? If you miss a dose, take it as soon as you remember on the same day and resume taking twice daily.  Do not take more than one dose of ELIQUIS at the same time to make up a missed dose.  Important Safety Information A possible side effect of Eliquis is bleeding. You should call your healthcare provider right away if you  experience any of the following: ? Bleeding from an injury or your nose that does not stop. ? Unusual colored urine (red or dark brown) or unusual colored stools (red or black). ? Unusual bruising for unknown reasons. ? A serious fall or if you hit your head (even if there is no bleeding).  Some medicines may interact with Eliquis and might increase your risk of bleeding or clotting while on Eliquis. To help avoid this, consult your healthcare provider or pharmacist prior to using any new prescription or non-prescription medications, including herbals, vitamins, non-steroidal anti-inflammatory drugs (NSAIDs) and supplements.  This website has more information on Eliquis (apixaban): http://www.eliquis.com/eliquis/home

## 2016-05-23 NOTE — Op Note (Signed)
NAMOleh Genin:  Short, [name]                ACCOUNT NO.:  [id number]  MEDICAL RECORD NO.:  [id number]  LOCATION:  [id number]                        FACILITY:  [facility]  PHYSICIAN:  Vanita PandaChristopher Y. Magnus IvanBlackman, M.D.DATE OF BIRTH:  [birth year]  DATE OF PROCEDURE:  05/22/2016 DATE OF DISCHARGE:                              OPERATIVE REPORT   PREOPERATIVE DIAGNOSIS:  Displaced left hip femoral neck fracture.  POSTOPERATIVE DIAGNOSIS:  Displaced left hip femoral neck fracture.  PROCEDURE:  Left total hip arthroplasty through direct anterior approach.  IMPLANTS:  DePuy Sector Gription acetabular component size 54, size 36+ 4 neutral polyethylene liner, size 16 Corail femoral component with standard offset, size 36+ 5 ceramic hip ball.  SURGEON:  Vanita PandaChristopher Y. Magnus IvanBlackman, M.D.  ASSISTANT:  Richardean CanalGilbert Clark, PA-C.  ANESTHESIA:  General.  ANTIBIOTICS:  2 g of IV Ancef.  BLOOD LOSS:  500 mL.  COMPLICATIONS:  None.  INDICATIONS:  Mr. [name] Short is only 50 year old gentleman with multiple medical problems who is on Eliquis secondary to a stroke.  He was in [place] this weekend and sustained a mechanical fall due to residual left-sided weakness and he fell down some stairs.  He sustained a significantly displaced left hip femoral neck fracture.  Due to displacement of this fracture and the fact that we could not treat it emergently, we would like to proceed with a total hip arthroplasty.  He was admitted to the Medicine Service and needed cardiac clearance prior to us proceeding with surgery.  There was some questionable cardiac ischemia that I believe was ruled out.  He did have a limited echocardiogram as well.  He has been off Eliquis now for 2 days and presents for operative intervention.  A detailed discussion of risks and benefits of surgery have been explained to him as well as the rationale for proceeding with a total hip arthroplasty.  PROCEDURE DESCRIPTION:  After informed consent was obtained,  appropriate left hip was marked.  He was brought to the operating room and general anesthesia was obtained while he was on a stretcher.  Traction boots were placed on both his feet.  Next, he was placed supine on the HANA fracture table with the perineal post in place and both legs in inline skeletal traction devices, but no traction applied.  His left operative hip was prepped and draped with DuraPrep and sterile drapes.  A time-out was called and he was identified as the correct patient and correct left hip.  We then made an incision just inferior and posterior to the anterior superior iliac spine and carried this just obliquely down the leg.  We dissected down the tensor fascia lata muscle and tensor fascia was then divided longitudinally, so we could proceed with a direct anterior approach to the hip.  We identified and cauterized the circumflex vessels and then identified the hip capsule.  I opened up the hip capsule in an L-type format finding a large hematoma consistent with a femoral neck fracture and we could visualize the fracture easily. Given __________ the fracture was, we made our femoral neck cut almost the level of the calcar and completed this with an osteotome.  We placed a corkscrew guide in the femoral  head and removed the femoral head in its entirety.  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  We then cleaned the acetabulum remnants from the acetabular labrum and other debris under direct visualization and I placed a bent Hohmann over the medial acetabular rim and then began reaming under direct visualization from a size 42 reamer and increments up to a size 54.  With all reamers under direct visualization, the last reamer under direct fluoroscopy, so I could obtain our depth of reaming, my inclination, and anteversion.  Once we were pleased with this, we placed the real DePuy Sector Gription acetabular component size 54, and a 36+ 4  polyethylene liner for that size acetabular component. Attention was then turned to the femur.  With the leg externally rotated to 120 degrees, extended and adducted, I was able to place a Mueller retractor medially and a Hohmann retractor behind the greater trochanter.  We released the lateral joint capsule and used a box cutting osteotome to enter the femoral canal and a rongeur to lateralize.  I then began broaching from a size 8 broach using the Corail broaching system up to a size 16.  With the 16 in place, we trialed a standard offset femoral neck and a 36+ 1.5 hip ball.  We brought the leg back over and up with traction and rotation reducing the pelvis and it was stable, but I felt like we needed to increase his offset and leg length looking it under fluoroscopy.  We dislocated the hip and removed the trial components.  We then placed the real size 16 Corail femoral component with standard offset and the real 36+ 5 ceramic hip ball and reduce this in the pelvis.  We were pleased with leg length, offset, and stability.  We then irrigated the soft tissue with normal saline solution using pulsatile lavage.  We closed some remnants of the joint capsule with interrupted #1 Ethibond suture followed by running #1 Vicryl in the tensor fascia, 0-Vicryl in the deep tissue, 2-0 Vicryl in the subcutaneous tissue, interrupted staples on the skin, and Aquacel dressing.  He was then taken off the HANA table, awakened, extubated, and taken to the recovery room in stable condition.  All final counts were correct.  There were no complications noted.  Of note, Richardean CanalGilbert Clark, PA-C assisted during the entire case.  His assistance was crucial for facilitating all aspects of this case.     Vanita Pandahristopher Y. Magnus IvanBlackman, M.D.     CYB/MEDQ  D:  05/22/2016  T:  05/23/2016  Job:  [id number]

## 2016-05-23 NOTE — Evaluation (Signed)
Physical Therapy Evaluation Patient Details Name: [name] Short MRN: [id number] DOB: [birth year] Today's Date: 05/23/2016   History of Present Illness  50 y.o. male s/p fall down stairs with resultant L hip fx s/p L THA (direct anterior).  Pt with significant PMHx of CVA with left sided weakness (walks with a cane at baseline), HIV, TB, seizure, cardiomyopathy, and HTN.  Clinical Impression  Pt was able to get up OOB to recliner chair today with two person mod assist and RW.  He has significant difficulty both progressing and WB on left leg due to post op pain, weakness, and pre morbid weakness.  Pt is most appropriate for SNF level rehab at discharge.   PT to follow acutely for deficits listed below.       Follow Up Recommendations SNF    Equipment Recommendations  Rolling walker with 5" wheels;Wheelchair (measurements PT);Wheelchair cushion (measurements PT) (with elevating leg rest)    Recommendations for Other Services   NA    Precautions / Restrictions Precautions Precautions: Fall Restrictions LLE Weight Bearing: Weight bearing as tolerated      Mobility  Bed Mobility Overal bed mobility: Needs Assistance Bed Mobility: Supine to Sit     Supine to sit: Min assist;HOB elevated     General bed mobility comments: Min assist to help progress left leg over EOB.  Verbal cues for safe hand placement.   Transfers Overall transfer level: Needs assistance   Transfers: Sit to/from Stand;Stand Pivot Transfers Sit to Stand: +2 physical assistance;Min assist;From elevated surface Stand pivot transfers: +2 physical assistance;Mod assist;From elevated surface       General transfer comment: Two person min assist to stand from an elevated bed.  Mod assist to turn to his left due to decreased ability to progress and WB through his left leg due to pain, post op weakness and pre morbid weakness.    Ambulation/Gait             General Gait Details: attempted gait, but pt with  significant difficulty progressing left leg forward and stabilizing to take steps, so transfer with RW preformed instead.          Balance Overall balance assessment: Needs assistance Sitting-balance support: Feet supported;Bilateral upper extremity supported Sitting balance-Leahy Scale: Fair     Standing balance support: Bilateral upper extremity supported Standing balance-Leahy Scale: Poor                               Pertinent Vitals/Pain Pain Assessment: 0-10 Pain Score: 7  Pain Location: left hip Pain Descriptors / Indicators: Aching Pain Intervention(s): Limited activity within patient's tolerance;Monitored during session;Repositioned    Home Living Family/patient expects to be discharged to:: Unsure (pt would like to go back to [place]) Living Arrangements: Other (Comment) (friend)               Additional Comments: Pt reports he was staying with a friend, but was planning on moving back to [place] (now).      Prior Function Level of Independence: Independent with assistive device(s)         Comments: walks with SPC     Hand Dominance   Dominant Hand: Right    Extremity/Trunk Assessment   Upper Extremity Assessment: LUE deficits/detail       LUE Deficits / Details: 4/5 throughout   Lower Extremity Assessment: LLE deficits/detail   LLE Deficits / Details: left leg, at baseline, is weaker than right  due to PMHx of stroke.  Pt reports decreased sensation in the left leg and he reports at one point he was wearing a brace to help his foot, but no longer does.  Pt with significant DF weakness and knee instability during gait.  Cervical / Trunk Assessment: Normal  Communication   Communication: Other (comment) (dysarthria)  Cognition Arousal/Alertness: Awake/alert Behavior During Therapy: WFL for tasks assessed/performed Overall Cognitive Status: Within Functional Limits for tasks assessed                          Exercises Total Joint Exercises Ankle Circles/Pumps: AROM;AAROM;Right;Left;20 reps Quad Sets: AROM;Left;10 reps Heel Slides: AAROM;Left;10 reps   Assessment/Plan    PT Assessment Patient needs continued PT services  PT Problem List Decreased strength;Decreased range of motion;Decreased activity tolerance;Decreased balance;Decreased mobility;Decreased knowledge of use of DME;Pain;Impaired sensation          PT Treatment Interventions DME instruction;Gait training;Stair training;Functional mobility training;Therapeutic activities;Therapeutic exercise;Balance training;Patient/family education    PT Goals (Current goals can be found in the Care Plan section)  Acute Rehab PT Goals Patient Stated Goal: to move back to [place] and get some assistance at home after rehab PT Goal Formulation: With patient Time For Goal Achievement: 05/30/16 Potential to Achieve Goals: Good    Frequency Min 3X/week (would benefit from more time per week)   Barriers to discharge Decreased caregiver support         End of Session Equipment Utilized During Treatment: Gait belt Activity Tolerance: Patient limited by pain;Patient limited by fatigue Patient left: in chair;with call bell/phone within reach           Time: 1444-1516 PT Time Calculation (min) (ACUTE ONLY): 32 min   Charges:   PT Evaluation $PT Eval Moderate Complexity: 1 Procedure PT Treatments $Therapeutic Activity: 8-22 mins        [name] Short, PT, DPT [phone]   05/23/2016, 6:13 PM

## 2016-05-23 NOTE — Progress Notes (Signed)
[facility]                                                                              Patient Demographics  [name] Short, is a 50 y.o. male, DOB - 1966/03/20, ZOX:[id number]  Admit date - 05/20/2016   Admitting Physician [name], MD  Outpatient Primary MD for the patient is [name], [name], MD  Outpatient specialists:   LOS - 3  days    Chief Complaint  Patient presents with  . Fall  . Hip Pain       Brief summary    [name] ColeCarl Wayment is a 50 y.o. male with medical history significant of recent dx of HIV, CM, CVA with left sided weakness, HTN, afib comes in after falling down a couple of stairs and injuring his left leg.  No LOC.  Pt has an obvious deformity to his left femur.  After the fall he did experience some sob and chest pain which was resolved.   Pt found to have left femoral fracture which is displaced.    Assessment & Plan    Principal Problem:   Femur fracture, left (HCC) - Status post left total hip arthroplasty, postop day #1 - Currently pain controlled, added stool softeners and bowel regimen - Start PT today. Patient wants to go to [place]-term rehab in [place]  Active Problems:  Uncontrolled hypertension - Likely worsened due to pain and anxiety. BP now much more stable. - Continue lisinopril, Coreg, added oral hydralazine    Left hemiparesis (HCC) with history of CVA - Due to prior CVA, at baseline walks with cane, no new neurological deficits    Cardiomyopathy (HCC) - Currently no chest pain or shortness of breath, or active cardiac symptoms - 2-D echo showed EF of 35-40%, moderate hypokinesis of apex and lateral myocardium. (Prior echo in 2016 had shown EF of 55-60%.) - Cardiology following, will follow recommendations. Currently on lisinopril, Coreg.     Atrial fibrillation (HCC) -Currently rate controlled, continue to hold eliquis     AIDS (acquired immunodeficiency syndrome), CD4 <=200 (HCC) - Restarted HAART  meds     Fall (on) (from) other stairs and steps, sequela - PT evaluation after surgery. Patient states that if he needs rehabilitation, [place] will prefer in [place].  Mild acute renal insufficiency  - Placed on gentle hydration, improving  Code Status: full  DVT Prophylaxis:  eliquis on hold  Family Communication: Discussed in detail with the patient, all imaging results, lab results explained to the patient    Disposition Plan:   Time Spent in minutes   25 minutes  Procedures:  Left hip arthroplasty 10/30  Consultants:   Cardiology Orthopedics  Antimicrobials:      Medications  Scheduled Meds: . apixaban  2.5 mg Oral BID  . atorvastatin  20 mg Oral q1800  . azithromycin  1,200 mg Oral Weekly  . carvedilol  25 mg Oral BID WC  . docusate sodium  100 mg Oral BID  . elvitegravir-cobicistat-emtricitabine-tenofovir  1 tablet Oral Q breakfast  . hydrALAZINE  25 mg Oral Q8H  . levETIRAcetam  1,000 mg Oral BID  .  lisinopril  40 mg Oral Daily  . loratadine  10 mg Oral Daily  . polyethylene glycol  17 g Oral Daily  . sulfamethoxazole-trimethoprim  1 tablet Oral Once per day on Sun Tue Thu   Continuous Infusions: . lactated ringers 10 mL/hr (05/22/16 1611)   PRN Meds:.acetaminophen **OR** acetaminophen, alum & mag hydroxide-simeth, hydrALAZINE, HYDROcodone-acetaminophen, HYDROmorphone (DILAUDID) injection, menthol-cetylpyridinium **OR** phenol, methocarbamol **OR** methocarbamol (ROBAXIN)  IV, methocarbamol **OR** methocarbamol (ROBAXIN)  IV, metoCLOPramide **OR** metoCLOPramide (REGLAN) injection, morphine injection, nitroGLYCERIN, ondansetron **OR** ondansetron (ZOFRAN) IV, oxyCODONE   Antibiotics   Anti-infectives    Start     Dose/Rate Route Frequency Ordered Stop   05/22/16 2300  ceFAZolin (ANCEF) IVPB 2g/100 mL premix     2 g 200 mL/hr over 30 Minutes Intravenous Every 6 hours 05/22/16 2051 05/23/16 0422   05/22/16 0900  sulfamethoxazole-trimethoprim  (BACTRIM,SEPTRA) 400-80 MG per tablet 1 tablet  Status:  Discontinued     1 tablet Oral Once per day on Mon Wed Fri 05/21/16 0858 05/21/16 1324   05/22/16 0800  ceFAZolin (ANCEF) IVPB 2g/100 mL premix     2 g 200 mL/hr over 30 Minutes Intravenous To Short Stay 05/22/16 0214 05/22/16 1725   05/21/16 1500  sulfamethoxazole-trimethoprim (BACTRIM,SEPTRA) 400-80 MG per tablet 1 tablet     1 tablet Oral Once per day on Sun Tue Thu 05/21/16 1324     05/21/16 1000  azithromycin (ZITHROMAX) tablet 1,200 mg     1,200 mg Oral Weekly 05/21/16 0858     05/21/16 1000  elvitegravir-cobicistat-emtricitabine-tenofovir (GENVOYA) 150-150-200-10 MG tablet 1 tablet     1 tablet Oral Daily with breakfast 05/21/16 0858          Subjective:   Efstathios Sawin was seen and examined today. Pain controlled, denies any specific complaints this morning. Last BM on 10/28.  Patient denies dizziness, chest pain, shortness of breath, abdominal pain, N/V/D/C. No acute events overnight.    Objective:   Vitals:   05/22/16 2304 05/23/16 0023 05/23/16 0545 05/23/16 0844  BP: 137/84 123/89 (!) 122/94 (!) 126/96  Pulse: 90 82 90 95  Resp: 17 17 17    Temp: 97.8 F (36.6 C) 98.3 F (36.8 C) 98.3 F (36.8 C)   TempSrc: Oral Oral Oral   SpO2: 95% 96% 96%   Weight:      Height:        Intake/Output Summary (Last 24 hours) at 05/23/16 1108 Last data filed at 05/23/16 0450  Gross per 24 hour  Intake          3558.75 ml  Output             2350 ml  Net          1208.75 ml     Wt Readings from Last 3 Encounters:  05/20/16 88.5 kg (195 lb)  01/20/16 77.8 kg (171 lb 9.6 oz)  01/07/16 73.5 kg (162 lb)     Exam  General: Alert and oriented x 3, NAD  HEENT:  PERRLA, EOMI  Neck: Supple, no JVD  Cardiovascular: S1 S2clear, RRR  Respiratory: Clear to auscultation bilaterally, no wheezing, rales or rhonchi  Gastrointestinal: Soft, nontender, nondistended, + bowel sounds  Ext: no cyanosis clubbing or  edema  Neuro: left sided weakness chronic   Skin: No rashes  Psych: Normal affect and demeanor, alert and oriented x3    Data Reviewed:  I have personally reviewed following labs and imaging studies  Micro Results Recent Results (from the past  240 hour(s))  Surgical pcr screen     Status: None   Collection Time: 05/21/16 11:56 PM  Result Value Ref Range Status   MRSA, PCR NEGATIVE NEGATIVE Final   Staphylococcus aureus NEGATIVE NEGATIVE Final    Comment:        The Xpert SA Assay (FDA approved for NASAL specimens in patients over 48 years of age), is one component of a comprehensive surveillance program.  Test performance has been validated by [facility] for patients greater than or equal to 48 year old. It is not intended to diagnose infection nor to guide or monitor treatment.     Radiology Reports Dg Chest 1 View  Result Date: 05/20/2016 CLINICAL DATA:  Pt from home via EMS- Pt had CVA in 2016 and ambulates with cane. Pt missed a ledge walking onto a porch and fell. Pt c/o L hip pain. Pt is unable to bear weight on L hip and unable to move L extremity. Pt does have deficits to L side from previous CVA EXAM: CHEST 1 VIEW COMPARISON:  Chest x-ray dated 10/19/2015. FINDINGS: Cardiomegaly appears stable. Lungs are clear. No pleural effusion or pneumothorax seen. Osseous structures about the chest are unremarkable. IMPRESSION: No active disease.  Stable cardiomegaly. Electronically Signed   By: Bary RichardStan  Maynard M.D.   On: 05/20/2016 19:12   Pelvis Portable  Result Date: 05/22/2016 CLINICAL DATA:  Status post total hip replacement EXAM: PORTABLE PELVIS 1-2 VIEWS COMPARISON:  Intraoperative study obtained earlier in the day FINDINGS: There is a total hip replacement on the left with prosthetic components on the left well-seated. Soft tissue air on the left is an expected postoperative finding. No acute fracture or dislocation. There is mild narrowing of the right hip joint.  IMPRESSION: Total hip replacement on the left with prosthetic components appearing well-seated on single frontal view. No acute fracture or dislocation. Osteoarthritic changes noted in the right hip joint. Electronically Signed   By: Bretta BangWilliam  Woodruff III M.D.   On: 05/22/2016 20:01   Dg C-arm 1-60 Min  Result Date: 05/22/2016 CLINICAL DATA:  Left total hip arthroplasty EXAM: OPERATIVE left HIP (WITH PELVIS IF PERFORMED) 2 VIEWS TECHNIQUE: Fluoroscopic spot image(s) were submitted for interpretation post-operatively. COMPARISON:  Pelvic and left hip radiograph 05/20/2016 FINDINGS: Fluoroscopic images show the left hip V4 and after placement of a femoroacetabular prosthesis. Fluoroscopy time was reported as 30 seconds IMPRESSION: Intraoperative images of left total hip arthroplasty. Electronically Signed   By: Deatra RobinsonKevin  Herman M.D.   On: 05/22/2016 19:03   Dg Hip Operative Unilat W Or W/o Pelvis Left  Result Date: 05/22/2016 CLINICAL DATA:  Left total hip arthroplasty EXAM: OPERATIVE left HIP (WITH PELVIS IF PERFORMED) 2 VIEWS TECHNIQUE: Fluoroscopic spot image(s) were submitted for interpretation post-operatively. COMPARISON:  Pelvic and left hip radiograph 05/20/2016 FINDINGS: Fluoroscopic images show the left hip V4 and after placement of a femoroacetabular prosthesis. Fluoroscopy time was reported as 30 seconds IMPRESSION: Intraoperative images of left total hip arthroplasty. Electronically Signed   By: Deatra RobinsonKevin  Herman M.D.   On: 05/22/2016 19:03   Dg Hip Unilat With Pelvis 2-3 Views Left  Result Date: 05/20/2016 CLINICAL DATA:  Pt from home via EMS- Pt had CVA in 2016 and ambulates with cane. Pt missed a ledge walking onto a porch and fell. Pt c/o L hip pain. Pt is unable to bear weight on L hip and unable to move L extremity. Pt does have deficits to L side from previous CVA EXAM: DG HIP (  WITH OR WITHOUT PELVIS) 2-3V LEFT COMPARISON:  None. FINDINGS: There is a displaced fracture within the subcapital  region of the left femoral neck, with prominent angulation deformity approaching 90 degrees. Left femoral head appears to remain well-positioned relative to the acetabulum. No additional fracture or dislocation seen. Soft tissues about the pelvis and left hip are unremarkable. IMPRESSION: Displaced fracture within the left femoral neck, subcapital region, with associated prominent angulation deformity. Electronically Signed   By: Bary Richard M.D.   On: 05/20/2016 19:14    Lab Data:  CBC:  Recent Labs Lab 05/20/16 1809 05/21/16 0456 05/22/16 0502 05/23/16 0557  WBC 5.3 9.8 6.6 7.3  NEUTROABS 3.2  --   --   --   HGB 13.6 14.2 13.2 12.3*  HCT 41.3 43.0 40.0 37.5*  MCV 99.3 98.2 97.3 98.9  PLT 124* 122* 111* 89*   Basic Metabolic Panel:  Recent Labs Lab 05/20/16 1809 05/21/16 0456 05/22/16 0502 05/23/16 0557  NA 138 136 135 135  K 4.3 4.5 3.8 4.2  CL 109 105 107 107  CO2 25 25 21* 22  GLUCOSE 112* 98 88 129*  BUN 14 14 14 15   CREATININE 1.51* 1.39* 1.29* 1.20  CALCIUM 9.0 9.1 8.7* 8.6*   GFR: Estimated Creatinine Clearance: 84.3 mL/min (by C-G formula based on SCr of 1.2 mg/dL). Liver Function Tests: No results for input(s): AST, ALT, ALKPHOS, BILITOT, PROT, ALBUMIN in the last 168 hours. No results for input(s): LIPASE, AMYLASE in the last 168 hours. No results for input(s): AMMONIA in the last 168 hours. Coagulation Profile: No results for input(s): INR, PROTIME in the last 168 hours. Cardiac Enzymes:  Recent Labs Lab 05/20/16 1809 05/21/16 0652  TROPONINI 0.04* 0.06*   BNP (last 3 results) No results for input(s): PROBNP in the last 8760 hours. HbA1C: No results for input(s): HGBA1C in the last 72 hours. CBG: No results for input(s): GLUCAP in the last 168 hours. Lipid Profile: No results for input(s): CHOL, HDL, LDLCALC, TRIG, CHOLHDL, LDLDIRECT in the last 72 hours. Thyroid Function Tests: No results for input(s): TSH, T4TOTAL, FREET4, T3FREE,  THYROIDAB in the last 72 hours. Anemia Panel: No results for input(s): VITAMINB12, FOLATE, FERRITIN, TIBC, IRON, RETICCTPCT in the last 72 hours. Urine analysis:    Component Value Date/Time   COLORURINE YELLOW 10/19/2015 2229   APPEARANCEUR CLEAR 10/19/2015 2229   LABSPEC 1.010 10/19/2015 2229   PHURINE 7.5 10/19/2015 2229   GLUCOSEU NEGATIVE 10/19/2015 2229   HGBUR SMALL (A) 10/19/2015 2229   BILIRUBINUR small 12/30/2015 1429   KETONESUR NEGATIVE 10/19/2015 2229   PROTEINUR 30 12/30/2015 1429   PROTEINUR NEGATIVE 10/19/2015 2229   UROBILINOGEN 1.0 12/30/2015 1429   UROBILINOGEN 1.0 11/18/2014 1334   NITRITE negative 12/30/2015 1429   NITRITE NEGATIVE 10/19/2015 2229   LEUKOCYTESUR Negative 12/30/2015 1429     RAI,RIPUDEEP M.D. [facility] 05/23/2016, 11:08 AM  Pager: [phone] Between 7am to 7pm - call Pager - [phone]  After 7pm go to [email or web address] - password [email or web address]  Call night coverage person covering after 7pm

## 2016-05-23 NOTE — Progress Notes (Signed)
Subjective:  No recurrent chest pain  Objective:   Vital Signs : Vitals:   05/22/16 2304 05/23/16 0023 05/23/16 0545 05/23/16 0844  BP: 137/84 123/89 (!) 122/94 (!) 126/96  Pulse: 90 82 90 95  Resp: 17 17 17    Temp: 97.8 F (36.6 C) 98.3 F (36.8 C) 98.3 F (36.8 C)   TempSrc: Oral Oral Oral   SpO2: 95% 96% 96%   Weight:      Height:        Intake/Output from previous day:  Intake/Output Summary (Last 24 hours) at 05/23/16 1412 Last data filed at 05/23/16 1200  Gross per 24 hour  Intake          3558.75 ml  Output             1650 ml  Net          1908.75 ml    I/O since admission: -325  Wt Readings from Last 3 Encounters:  05/20/16 195 lb (88.5 kg)  01/20/16 171 lb 9.6 oz (77.8 kg)  01/07/16 162 lb (73.5 kg)    Medications: . apixaban  2.5 mg Oral BID  . atorvastatin  20 mg Oral q1800  . azithromycin  1,200 mg Oral Weekly  . carvedilol  25 mg Oral BID WC  . docusate sodium  100 mg Oral BID  . elvitegravir-cobicistat-emtricitabine-tenofovir  1 tablet Oral Q breakfast  . hydrALAZINE  25 mg Oral Q12H  . levETIRAcetam  1,000 mg Oral BID  . lisinopril  40 mg Oral Daily  . loratadine  10 mg Oral Daily  . polyethylene glycol  17 g Oral Daily  . sulfamethoxazole-trimethoprim  1 tablet Oral Once per day on Sun Tue Thu    . lactated ringers 10 mL/hr (05/22/16 1611)    Physical Exam:  BP (!) 126/96 (BP Location: Left Arm)   Pulse 95   Temp 98.3 F (36.8 C) (Oral)   Resp 17   Ht 5' 10"  (1.778 m)   Wt 195 lb (88.5 kg)   SpO2 96%   BMI 27.98 kg/m  General appearance: alert, cooperative and slow speech Neck: no adenopathy, no JVD, supple, symmetrical, trachea midline and thyroid not enlarged, symmetric, no tenderness/mass/nodules Lungs: clear to auscultation bilaterally Heart: irregularly irregular rhythm Abdomen: soft, non-tender; bowel sounds normal; no masses,  no organomegaly Extremities: s/p left hi[p arthroplasty; no edema, redness or tenderness in  the calves or thighs Neurologic: residual left sided weakness   Rate: 90  Rhythm: atrial fibrillation  ECG (independently read by me): AF at 79  Post op ECG not done; will check today  Lab Results:   Recent Labs  05/21/16 0456 05/22/16 0502 05/23/16 0557  NA 136 135 135  K 4.5 3.8 4.2  CL 105 107 107  CO2 25 21* 22  GLUCOSE 98 88 129*  BUN 14 14 15   CREATININE 1.39* 1.29* 1.20  CALCIUM 9.1 8.7* 8.6*    Hepatic Function Latest Ref Rng & Units 12/30/2015 03/15/2015 11/06/2014  Total Protein 6.1 - 8.1 g/dL 7.9 8.6(H) 8.6(H)  Albumin 3.6 - 5.1 g/dL 3.7 3.8 3.0(L)  AST 10 - 40 U/L 17 21 45(H)  ALT 9 - 46 U/L 8(L) 16 19  Alk Phosphatase 40 - 115 U/L 68 78 64  Total Bilirubin 0.2 - 1.2 mg/dL 0.6 0.7 1.1     Recent Labs  05/20/16 1809 05/21/16 0456 05/22/16 0502 05/23/16 0557  WBC 5.3 9.8 6.6 7.3  NEUTROABS 3.2  --   --   --  HGB 13.6 14.2 13.2 12.3*  HCT 41.3 43.0 40.0 37.5*  MCV 99.3 98.2 97.3 98.9  PLT 124* 122* 111* 89*     Recent Labs  05/20/16 1809 05/21/16 0652  TROPONINI 0.04* 0.06*    No results found for: TSH No results for input(s): HGBA1C in the last 72 hours.  No results for input(s): PROT, ALBUMIN, AST, ALT, ALKPHOS, BILITOT, BILIDIR, IBILI in the last 72 hours. No results for input(s): INR in the last 72 hours. BNP (last 3 results) No results for input(s): BNP in the last 8760 hours.  ProBNP (last 3 results) No results for input(s): PROBNP in the last 8760 hours.   Lipid Panel     Component Value Date/Time   CHOL 164 11/01/2014 0601   TRIG 72 11/01/2014 0601   HDL 28 (L) 11/01/2014 0601   CHOLHDL 5.9 11/01/2014 0601   VLDL 14 11/01/2014 0601   LDLCALC 122 (H) 11/01/2014 0601      Imaging:  Pelvis Portable  Result Date: 05/22/2016 CLINICAL DATA:  Status post total hip replacement EXAM: PORTABLE PELVIS 1-2 VIEWS COMPARISON:  Intraoperative study obtained earlier in the day FINDINGS: There is a total hip replacement on the left  with prosthetic components on the left well-seated. Soft tissue air on the left is an expected postoperative finding. No acute fracture or dislocation. There is mild narrowing of the right hip joint. IMPRESSION: Total hip replacement on the left with prosthetic components appearing well-seated on single frontal view. No acute fracture or dislocation. Osteoarthritic changes noted in the right hip joint. Electronically Signed   By: Lowella Grip III M.D.   On: 05/22/2016 20:01   Dg C-arm 1-60 Min  Result Date: 05/22/2016 CLINICAL DATA:  Left total hip arthroplasty EXAM: OPERATIVE left HIP (WITH PELVIS IF PERFORMED) 2 VIEWS TECHNIQUE: Fluoroscopic spot image(s) were submitted for interpretation post-operatively. COMPARISON:  Pelvic and left hip radiograph 05/20/2016 FINDINGS: Fluoroscopic images show the left hip V4 and after placement of a femoroacetabular prosthesis. Fluoroscopy time was reported as 30 seconds IMPRESSION: Intraoperative images of left total hip arthroplasty. Electronically Signed   By: Ulyses Jarred M.D.   On: 05/22/2016 19:03   Dg Hip Operative Unilat W Or W/o Pelvis Left  Result Date: 05/22/2016 CLINICAL DATA:  Left total hip arthroplasty EXAM: OPERATIVE left HIP (WITH PELVIS IF PERFORMED) 2 VIEWS TECHNIQUE: Fluoroscopic spot image(s) were submitted for interpretation post-operatively. COMPARISON:  Pelvic and left hip radiograph 05/20/2016 FINDINGS: Fluoroscopic images show the left hip V4 and after placement of a femoroacetabular prosthesis. Fluoroscopy time was reported as 30 seconds IMPRESSION: Intraoperative images of left total hip arthroplasty. Electronically Signed   By: Ulyses Jarred M.D.   On: 05/22/2016 19:03    ------------------------------------------------------------------- 02/01/15 ECHO Study Conclusions  - Left ventricle: The cavity size was normal. There was severe   concentric hypertrophy. Systolic function was normal. The   estimated ejection fraction was  in the range of 55% to 60%. The   study is not technically sufficient to allow evaluation of LV   diastolic function. - Aortic valve: Trileaflet. Sclerosis without stenosis. There was   mild regurgitation. - Mitral valve: Mildly thickened leaflets . There was trivial   regurgitation. - Left atrium: Severely dilated at 51 ml/m2. - Right atrium: Severely dilated at 29 cm2. - Inferior vena cava: The vessel was dilated. The respirophasic   diameter changes were blunted (< 50%), consistent with elevated   central venous pressure.  Impressions:  - Compared to the  prior echo in 10/2014, the LVEF has improved to   55-60%, there is severe LVH and severe biatrial enlargement.  ------------------------------------------------------------------- Labs, prior tests, procedures, and surgery:  Transesophageal echocardiography (11/03/2014).     EF was 35%.  Transthoracic echocardiography (10/31/2014).     EF was 40% and PA pressure was 45 (systolic).  ------------------------------------------------------------------- 05/22/16 ECHO Study Conclusions  - Left ventricle: The cavity size was mildly dilated. Wall   thickness was increased in a pattern of severe LVH. Systolic   function was moderately reduced. The estimated ejection fraction   was in the range of 35% to 40%. Moderate hypokinesis of the   apicallateral myocardium. Akinesis of the basalinferior   myocardium. - Aortic valve: There was mild regurgitation. - Mitral valve: There was mild regurgitation. - Left atrium: The atrium was mildly to moderately dilated. - Right atrium: The atrium was mildly to moderately dilated.   Assessment/Plan:   Principal Problem:   Femur fracture, left (HCC) Active Problems:   Left hemiparesis (HCC)   Cardiomyopathy ([place])   Atrial fibrillation (HCC)   History of stroke   AIDS (acquired immunodeficiency syndrome), CD4 <=200 ([place])   Fall (on) (from) other stairs and steps, sequela   Elevated  troponin   Chest pain   SOB (shortness of breath)   Closed fracture of left hip (HCC)   Displaced fracture of base of neck of left femur, initial encounter for closed fracture ([place])  1. Day 1 s/p left total hip arthroplasty for femur fracture 2. HTN 3. Cardiomyopathy 4. H/o CVA with residual left sided weakness and slow speech 5. Permanent AF 6. HIV 7. Thrombocytopenia 8. Anticoagulation  Tolerated surgery. Echo with EF 35-40% which had been presently previously but had improved with med Rx.  No chest pain. Will check post-op ECG. Now on low dose eliquis; platelets 89 today.  With decline in EF will add spironolactone 12.5 mg to regimen of ACE-I/Coreg.   Troy Sine, MD, [facility] 05/23/2016, 2:12 PM

## 2016-05-23 NOTE — Progress Notes (Signed)
Subjective: 1 Day Post-Op Procedure(s) (LRB): TOTAL HIP ARTHROPLASTY ANTERIOR APPROACH (Left) Patient reports pain as moderate.  Tolerated his left hip surgery very well.  Objective: Vital signs in last 24 hours: Temp:  [97.8 F (36.6 C)-98.7 F (37.1 C)] 98.3 F (36.8 C) (10/31 0545) Pulse Rate:  [78-98] 90 (10/31 0545) Resp:  [15-25] 17 (10/31 0545) BP: (89-170)/(65-109) 122/94 (10/31 0545) SpO2:  [95 %-97 %] 96 % (10/31 0545)  Intake/Output from previous day: 10/30 0701 - 10/31 0700 In: 3558.8 [P.O.:400; I.V.:2958.8; IV Piggyback:200] Out: 2350 [Urine:1850; Blood:500] Intake/Output this shift: No intake/output data recorded.   Recent Labs  05/20/16 1809 05/21/16 0456 05/22/16 0502 05/23/16 0557  HGB 13.6 14.2 13.2 12.3*    Recent Labs  05/22/16 0502 05/23/16 0557  WBC 6.6 7.3  RBC 4.11* 3.79*  HCT 40.0 37.5*  PLT 111* 89*    Recent Labs  05/21/16 0456 05/22/16 0502  NA 136 135  K 4.5 3.8  CL 105 107  CO2 25 21*  BUN 14 14  CREATININE 1.39* 1.29*  GLUCOSE 98 88  CALCIUM 9.1 8.7*   No results for input(s): LABPT, INR in the last 72 hours.  Sensation intact distally Intact pulses distally Incision: dressing C/D/I Residual weakness in left LE due to previous stroke effecting left side   Assessment/Plan: 1 Day Post-Op Procedure(s) (LRB): TOTAL HIP ARTHROPLASTY ANTERIOR APPROACH (Left) Up with therapy - WBAT left hip  [name] Short 05/23/2016, 7:15 AM

## 2016-05-24 ENCOUNTER — Encounter (HOSPITAL_COMMUNITY): Payer: Self-pay

## 2016-05-24 DIAGNOSIS — S728X2S Other fracture of left femur, sequela: Secondary | ICD-10-CM

## 2016-05-24 DIAGNOSIS — B20 Human immunodeficiency virus [HIV] disease: Secondary | ICD-10-CM

## 2016-05-24 LAB — BASIC METABOLIC PANEL
ANION GAP: 7 (ref 5–15)
BUN: 26 mg/dL — ABNORMAL HIGH (ref 6–20)
CALCIUM: 8.6 mg/dL — AB (ref 8.9–10.3)
CO2: 26 mmol/L (ref 22–32)
CREATININE: 1.27 mg/dL — AB (ref 0.61–1.24)
Chloride: 104 mmol/L (ref 101–111)
Glucose, Bld: 129 mg/dL — ABNORMAL HIGH (ref 65–99)
Potassium: 4.4 mmol/L (ref 3.5–5.1)
SODIUM: 137 mmol/L (ref 135–145)

## 2016-05-24 LAB — CBC
HEMATOCRIT: 31.2 % — AB (ref 39.0–52.0)
Hemoglobin: 10.4 g/dL — ABNORMAL LOW (ref 13.0–17.0)
MCH: 32 pg (ref 26.0–34.0)
MCHC: 33.3 g/dL (ref 30.0–36.0)
MCV: 96 fL (ref 78.0–100.0)
PLATELETS: 102 10*3/uL — AB (ref 150–400)
RBC: 3.25 MIL/uL — ABNORMAL LOW (ref 4.22–5.81)
RDW: 12.8 % (ref 11.5–15.5)
WBC: 11.1 10*3/uL — AB (ref 4.0–10.5)

## 2016-05-24 NOTE — Evaluation (Signed)
Occupational Therapy Evaluation Patient Details Name: [name] Short MRN: [id number] DOB: [birth year] Today's Date: 05/24/2016    History of Present Illness 50 y.o. male s/p fall down stairs with resultant L hip fx s/p L THA (direct anterior).  Pt with significant PMHx of CVA with left sided weakness (walks with a cane at baseline), HIV, TB, seizure, cardiomyopathy, and HTN.   Clinical Impression   PTA Pt independent in ADL and mobility with a SPC. Pt currently set up for seated ADL, and +2 for mobility. Pt with deficit list below. He will benefit from skilled OT in the acute setting to maximize independence in ADL. Pt with questions as to where he will be going after hospitalization. OT currently recommending SNF to continue to develop self care independence skills. OT to follow, next session, work on transfers (+2).    Follow Up Recommendations  SNF;Supervision/Assistance - 24 hour    Equipment Recommendations  Other (comment) (TBD by next venue of care)    Recommendations for Other Services       Precautions / Restrictions Precautions Precautions: Fall Restrictions Weight Bearing Restrictions: Yes RLE Weight Bearing:  (None) LLE Weight Bearing: Weight bearing as tolerated      Mobility Bed Mobility Overal bed mobility: Needs Assistance Bed Mobility: Supine to Sit     Supine to sit: Min assist;HOB elevated     General bed mobility comments: Min assist to help LLE over EOB.  vc for sequencing/safe hand placement  Transfers                 General transfer comment: not attempted this session due to +2 with PT    Balance Overall balance assessment: Needs assistance Sitting-balance support: Bilateral upper extremity supported;Feet supported;Single extremity supported Sitting balance-Leahy Scale: Poor Sitting balance - Comments: able to sit EOB for grooming tasks, but required min A at times during functional activity Postural control: Right lateral lean                                  ADL Overall ADL's : Needs assistance/impaired Eating/Feeding: Sitting;Supervision/ safety   Grooming: Wash/dry face;Oral care;Set up;Minimal assistance;Sitting (EOB)   Upper Body Bathing: Sitting;Min guard   Lower Body Bathing: Maximal assistance;Sitting/lateral leans   Upper Body Dressing : Set up;Sitting   Lower Body Dressing: Moderate assistance;Sitting/lateral leans Lower Body Dressing Details (indicate cue type and reason): Pt unable to reach to feet post-op sitting EOB or bed level Toilet Transfer: +2 for physical assistance;BSC;RW   Toileting- Clothing Manipulation and Hygiene: Maximal assistance   Tub/ Shower Transfer: +2 for physical assistance;Moderate assistance   Functional mobility during ADLs: +2 for physical assistance (not attempted this session - clinical judgement and PT note)       Vision     Perception     Praxis      Pertinent Vitals/Pain Pain Assessment: No/denies pain Pain Score: 0-No pain Pain Location: "It only hurts when I stand up" Pain Intervention(s): Limited activity within patient's tolerance;Monitored during session;Ice applied     Hand Dominance Right   Extremity/Trunk Assessment Upper Extremity Assessment Upper Extremity Assessment: LUE deficits/detail LUE Deficits / Details: 4/5 throughout LUE Sensation: decreased light touch (since CVA)   Lower Extremity Assessment Lower Extremity Assessment: LLE deficits/detail LLE Deficits / Details: left leg, at baseline, is weaker than right due to PMHx of stroke. Pt reports decreased sensation in the left leg and he reports at  one point he was wearing a brace to help his foot, but no longer does.  LLE Sensation: decreased light touch   Cervical / Trunk Assessment Cervical / Trunk Assessment: Normal   Communication Communication Communication: Other (comment) (dysarthria)   Cognition Arousal/Alertness: Awake/alert Behavior During Therapy: WFL  for tasks assessed/performed Overall Cognitive Status: Within Functional Limits for tasks assessed                     General Comments       Exercises       Shoulder Instructions      Home Living Family/patient expects to be discharged to:: Unsure ([place] like to go back to [place]) Living Arrangements: Other (Comment) (Friend)                               Additional Comments: Pt reports he was staying with a friend, but was planning on moving back to [place] (now).  Pt reports having a 48 year old daughter and 878 year old daughter in [place] that he wants to get back to.      Prior Functioning/Environment Level of Independence: Independent with assistive device(s)        Comments: walks with SPC        OT Problem List: Decreased strength;Decreased range of motion;Decreased activity tolerance;Impaired balance (sitting and/or standing);Decreased safety awareness;Decreased knowledge of use of DME or AE;Impaired sensation;Pain   OT Treatment/Interventions: Self-care/ADL training;Therapeutic exercise;DME and/or AE instruction;Therapeutic activities;Patient/family education;Balance training    OT Goals(Current goals can be found in the care plan section) Acute Rehab OT Goals Patient Stated Goal: to move back to [place] and get some assistance at home after rehab OT Goal Formulation: With patient Time For Goal Achievement: 05/31/16 Potential to Achieve Goals: Good ADL Goals Pt Will Perform Grooming: with modified independence;standing Pt Will Perform Lower Body Dressing: with modified independence;sit to/from stand Pt Will Transfer to Toilet: with modified independence;ambulating;regular height toilet Pt Will Perform Toileting - Clothing Manipulation and hygiene: with modified independence;sit to/from stand Pt Will Perform Tub/Shower Transfer: Tub transfer;ambulating  OT Frequency: Min 3X/week   Barriers to D/C: Decreased caregiver  support;Other (comment) (unsure of where he will go)  Pt unclear as to who will help him after discharge       Co-evaluation              End of Session Nurse Communication: Mobility status  Activity Tolerance: Patient tolerated treatment well Patient left: in bed;with call bell/phone within reach   Time: [id number] OT Time Calculation (min): 22 min Charges:  OT General Charges $OT Visit: 1 Procedure OT Evaluation $OT Eval Moderate Complexity: 1 Procedure G-Codes:    Evern BioLaura J Anielle Headrick 05/24/2016, 5:22 PM  Sherryl MangesLaura Yentl Verge OTR/L [phone]

## 2016-05-24 NOTE — Progress Notes (Signed)
[facility] Progress note                         [name] ColeCarl Short, is a 50 y.o. male, DOB - 02-16-1966, ZOX:[id number]  Admit date - 05/20/2016   Admitting Physician [name], MD  Outpatient Primary MD for the patient is [name] Short, [name] C, MD LOS - 4  days   Chief Complaint  Patient presents with  . Fall  . Hip Pain       Brief summary    [name] Short is a 50 y.o. male with medical history significant of recent dx of HIV, CM, CVA with left sided weakness, HTN, afib comes in after falling down a couple of stairs and injuring his left leg.  No LOC.  Pt has an obvious deformity to his left femur.  After the fall he did experience some sob and chest pain which was resolved.   Pt found to have left femoral fracture which is displaced, underwent L Hip arthroplasty 10/30   Assessment & Plan     Femur fracture, left (HCC) - Status post left total hip arthroplasty, postop day #2 - pain control, PT - SNF for rehab - on eliquis for Afib so doesn't need additional DVT prophylaxis   Uncontrolled hypertension -BP stable now, continue lisinopril, Coreg    Left hemiparesis (HCC) with history of CVA - Due to prior CVA, at baseline walks with cane, no new neurological deficits - continue eliquis    Cardiomyopathy (HCC) - compensated - 2-D echo showed EF of 35-40%, moderate hypokinesis of apex and lateral myocardium. (Prior echo in 2016 had shown EF of 55-60%.) - stable, continue lisinopril, Coreg.     Atrial fibrillation (HCC) -Currently rate controlled, continue to hold eliquis     AIDS (acquired immunodeficiency syndrome), CD4 <=200 (HCC) - continue HAART meds, FU with ID    Mild renal insufficiency  - mild chronic, stable  Code Status: full  DVT Prophylaxis:  eliquis  Family Communication: None at bedside Disposition Plan: SNF when bed avilable  Time Spent in minutes   25 minutes  Procedures:  Left hip arthroplasty 10/30  Consultants:    Cardiology Orthopedics  Antimicrobials:      Medications  Scheduled Meds: . apixaban  2.5 mg Oral BID  . atorvastatin  20 mg Oral q1800  . azithromycin  1,200 mg Oral Weekly  . carvedilol  25 mg Oral BID WC  . docusate sodium  100 mg Oral BID  . elvitegravir-cobicistat-emtricitabine-tenofovir  1 tablet Oral Q breakfast  . hydrALAZINE  25 mg Oral Q12H  . levETIRAcetam  1,000 mg Oral BID  . lisinopril  40 mg Oral Daily  . loratadine  10 mg Oral Daily  . polyethylene glycol  17 g Oral Daily  . sulfamethoxazole-trimethoprim  1 tablet Oral Once per day on Sun Tue Thu   Continuous Infusions: . lactated ringers 10 mL/hr (05/22/16 1611)   PRN Meds:.acetaminophen **OR** acetaminophen, alum & mag hydroxide-simeth, hydrALAZINE, HYDROcodone-acetaminophen, HYDROmorphone (DILAUDID) injection, menthol-cetylpyridinium **OR** phenol, methocarbamol **OR** methocarbamol (ROBAXIN)  IV, methocarbamol **OR** methocarbamol (ROBAXIN)  IV, metoCLOPramide **OR** metoCLOPramide (REGLAN) injection, morphine injection, nitroGLYCERIN, ondansetron **OR** ondansetron (ZOFRAN) IV, oxyCODONE   Antibiotics   Anti-infectives    Start     Dose/Rate Route Frequency Ordered Stop   05/22/16 2300  ceFAZolin (ANCEF) IVPB 2g/100 mL premix     2 g 200 mL/hr over 30 Minutes  Intravenous Every 6 hours 05/22/16 2051 05/23/16 0422   05/22/16 0900  sulfamethoxazole-trimethoprim (BACTRIM,SEPTRA) 400-80 MG per tablet 1 tablet  Status:  Discontinued     1 tablet Oral Once per day on Mon Wed Fri 05/21/16 0858 05/21/16 1324   05/22/16 0800  ceFAZolin (ANCEF) IVPB 2g/100 mL premix     2 g 200 mL/hr over 30 Minutes Intravenous To Short Stay 05/22/16 0214 05/22/16 1725   05/21/16 1500  sulfamethoxazole-trimethoprim (BACTRIM,SEPTRA) 400-80 MG per tablet 1 tablet     1 tablet Oral Once per day on Sun Tue Thu 05/21/16 1324     05/21/16 1000  azithromycin (ZITHROMAX) tablet 1,200 mg     1,200 mg Oral Weekly 05/21/16 0858      05/21/16 1000  elvitegravir-cobicistat-emtricitabine-tenofovir (GENVOYA) 150-150-200-10 MG tablet 1 tablet     1 tablet Oral Daily with breakfast 05/21/16 0858          Subjective:   No complaints, feels well, took few steps today with NT  Objective:   Vitals:   05/23/16 1500 05/23/16 2122 05/24/16 0704 05/24/16 0900  BP: (!) 128/97 120/74 122/77 120/76  Pulse: 75 82 75 86  Resp: 16 16 16 18   Temp: 98.4 F (36.9 Short) 98.2 F (36.8 Short) 98.1 F (36.7 Short)   TempSrc:  Oral Oral   SpO2: 96% 100% 100%   Weight:      Height:        Intake/Output Summary (Last 24 hours) at 05/24/16 1552 Last data filed at 05/24/16 0900  Gross per 24 hour  Intake              600 ml  Output             1500 ml  Net             -900 ml     Wt Readings from Last 3 Encounters:  05/20/16 88.5 kg (195 lb)  01/20/16 77.8 kg (171 lb 9.6 oz)  01/07/16 73.5 kg (162 lb)     Exam  General: Alert and oriented x 3, NAD  HEENT:  PERRLA, EOMI  Neck: Supple, no JVD  Cardiovascular: S1 S2clear, RRR  Respiratory: Clear to auscultation bilaterally, no wheezing, rales or rhonchi  Gastrointestinal: Soft, nontender, nondistended, + bowel sounds  Ext: no cyanosis clubbing or edema, dressing noted  Neuro: left sided weakness chronic   Skin: No rashes  Psych: Normal affect and demeanor, alert and oriented x3    Data Reviewed:  I have personally reviewed following labs and imaging studies  Micro Results Recent Results (from the past 240 hour(s))  Surgical pcr screen     Status: None   Collection Time: 05/21/16 11:56 PM  Result Value Ref Range Status   MRSA, PCR NEGATIVE NEGATIVE Final   Staphylococcus aureus NEGATIVE NEGATIVE Final    Comment:        The Xpert SA Assay (FDA approved for NASAL specimens in patients over 93 years of age), is one component of a comprehensive surveillance program.  Test performance has been validated by [facility] for patients greater than or equal to 22  year old. It is not intended to diagnose infection nor to guide or monitor treatment.     Radiology Reports Dg Chest 1 View  Result Date: 05/20/2016 CLINICAL DATA:  Pt from home via EMS- Pt had CVA in 2016 and ambulates with cane. Pt missed a ledge walking onto a porch and fell. Pt Short/o L hip pain.  Pt is unable to bear weight on L hip and unable to move L extremity. Pt does have deficits to L side from previous CVA EXAM: CHEST 1 VIEW COMPARISON:  Chest x-ray dated 10/19/2015. FINDINGS: Cardiomegaly appears stable. Lungs are clear. No pleural effusion or pneumothorax seen. Osseous structures about the chest are unremarkable. IMPRESSION: No active disease.  Stable cardiomegaly. Electronically Signed   By: Bary RichardStan  Maynard M.D.   On: 05/20/2016 19:12   Pelvis Portable  Result Date: 05/22/2016 CLINICAL DATA:  Status post total hip replacement EXAM: PORTABLE PELVIS 1-2 VIEWS COMPARISON:  Intraoperative study obtained earlier in the day FINDINGS: There is a total hip replacement on the left with prosthetic components on the left well-seated. Soft tissue air on the left is an expected postoperative finding. No acute fracture or dislocation. There is mild narrowing of the right hip joint. IMPRESSION: Total hip replacement on the left with prosthetic components appearing well-seated on single frontal view. No acute fracture or dislocation. Osteoarthritic changes noted in the right hip joint. Electronically Signed   By: Bretta BangWilliam  Woodruff III M.D.   On: 05/22/2016 20:01   Dg Short-arm 1-60 Min  Result Date: 05/22/2016 CLINICAL DATA:  Left total hip arthroplasty EXAM: OPERATIVE left HIP (WITH PELVIS IF PERFORMED) 2 VIEWS TECHNIQUE: Fluoroscopic spot image(s) were submitted for interpretation post-operatively. COMPARISON:  Pelvic and left hip radiograph 05/20/2016 FINDINGS: Fluoroscopic images show the left hip V4 and after placement of a femoroacetabular prosthesis. Fluoroscopy time was reported as 30 seconds  IMPRESSION: Intraoperative images of left total hip arthroplasty. Electronically Signed   By: Deatra RobinsonKevin  Herman M.D.   On: 05/22/2016 19:03   Dg Hip Operative Unilat W Or W/o Pelvis Left  Result Date: 05/22/2016 CLINICAL DATA:  Left total hip arthroplasty EXAM: OPERATIVE left HIP (WITH PELVIS IF PERFORMED) 2 VIEWS TECHNIQUE: Fluoroscopic spot image(s) were submitted for interpretation post-operatively. COMPARISON:  Pelvic and left hip radiograph 05/20/2016 FINDINGS: Fluoroscopic images show the left hip V4 and after placement of a femoroacetabular prosthesis. Fluoroscopy time was reported as 30 seconds IMPRESSION: Intraoperative images of left total hip arthroplasty. Electronically Signed   By: Deatra RobinsonKevin  Herman M.D.   On: 05/22/2016 19:03   Dg Hip Unilat With Pelvis 2-3 Views Left  Result Date: 05/20/2016 CLINICAL DATA:  Pt from home via EMS- Pt had CVA in 2016 and ambulates with cane. Pt missed a ledge walking onto a porch and fell. Pt Short/o L hip pain. Pt is unable to bear weight on L hip and unable to move L extremity. Pt does have deficits to L side from previous CVA EXAM: DG HIP (WITH OR WITHOUT PELVIS) 2-3V LEFT COMPARISON:  None. FINDINGS: There is a displaced fracture within the subcapital region of the left femoral neck, with prominent angulation deformity approaching 90 degrees. Left femoral head appears to remain well-positioned relative to the acetabulum. No additional fracture or dislocation seen. Soft tissues about the pelvis and left hip are unremarkable. IMPRESSION: Displaced fracture within the left femoral neck, subcapital region, with associated prominent angulation deformity. Electronically Signed   By: Bary RichardStan  Maynard M.D.   On: 05/20/2016 19:14    Lab Data:  CBC:  Recent Labs Lab 05/20/16 1809 05/21/16 0456 05/22/16 0502 05/23/16 0557 05/24/16 0605  WBC 5.3 9.8 6.6 7.3 11.1*  NEUTROABS 3.2  --   --   --   --   HGB 13.6 14.2 13.2 12.3* 10.4*  HCT 41.3 43.0 40.0 37.5* 31.2*  MCV  99.3 98.2 97.3 98.9 96.0  PLT 124* 122* 111* 89* 102*   Basic Metabolic Panel:  Recent Labs Lab 05/20/16 1809 05/21/16 0456 05/22/16 0502 05/23/16 0557 05/24/16 0605  NA 138 136 135 135 137  K 4.3 4.5 3.8 4.2 4.4  CL 109 105 107 107 104  CO2 25 25 21* 22 26  GLUCOSE 112* 98 88 129* 129*  BUN 14 14 14 15  26*  CREATININE 1.51* 1.39* 1.29* 1.20 1.27*  CALCIUM 9.0 9.1 8.7* 8.6* 8.6*   GFR: Estimated Creatinine Clearance: 79.7 mL/min (by Short-G formula based on SCr of 1.27 mg/dL (H)). Liver Function Tests: No results for input(s): AST, ALT, ALKPHOS, BILITOT, PROT, ALBUMIN in the last 168 hours. No results for input(s): LIPASE, AMYLASE in the last 168 hours. No results for input(s): AMMONIA in the last 168 hours. Coagulation Profile: No results for input(s): INR, PROTIME in the last 168 hours. Cardiac Enzymes:  Recent Labs Lab 05/20/16 1809 05/21/16 0652  TROPONINI 0.04* 0.06*   BNP (last 3 results) No results for input(s): PROBNP in the last 8760 hours. HbA1C: No results for input(s): HGBA1C in the last 72 hours. CBG: No results for input(s): GLUCAP in the last 168 hours. Lipid Profile: No results for input(s): CHOL, HDL, LDLCALC, TRIG, CHOLHDL, LDLDIRECT in the last 72 hours. Thyroid Function Tests: No results for input(s): TSH, T4TOTAL, FREET4, T3FREE, THYROIDAB in the last 72 hours. Anemia Panel: No results for input(s): VITAMINB12, FOLATE, FERRITIN, TIBC, IRON, RETICCTPCT in the last 72 hours. Urine analysis:    Component Value Date/Time   COLORURINE YELLOW 10/19/2015 2229   APPEARANCEUR CLEAR 10/19/2015 2229   LABSPEC 1.010 10/19/2015 2229   PHURINE 7.5 10/19/2015 2229   GLUCOSEU NEGATIVE 10/19/2015 2229   HGBUR SMALL (A) 10/19/2015 2229   BILIRUBINUR small 12/30/2015 1429   KETONESUR NEGATIVE 10/19/2015 2229   PROTEINUR 30 12/30/2015 1429   PROTEINUR NEGATIVE 10/19/2015 2229   UROBILINOGEN 1.0 12/30/2015 1429   UROBILINOGEN 1.0 11/18/2014 1334   NITRITE  negative 12/30/2015 1429   NITRITE NEGATIVE 10/19/2015 2229   LEUKOCYTESUR Negative 12/30/2015 1429     Denisa Enterline M.D. [facility] 05/24/2016, 3:52 PM  Pager: [phone] Between 7am to 7pm - call Pager - [phone]  After 7pm go to [email or web address] - password [email or web address]  Call night coverage person covering after 7pm

## 2016-05-24 NOTE — Clinical Social Work Note (Signed)
Clinical Social Work Assessment  Patient Details  Name: [name] Short MRN: [id number] Date of Birth: 03-16-1966  Date of referral:  05/24/16               Reason for consult:  Facility Placement                Permission sought to share information with:   (facility) Permission granted to share information::   (facility)  Name::        Agency::     Relationship::     Contact Information:     Housing/Transportation Living arrangements for the past 2 months:  Single Family Home (Patient states that he rents a home from his friend who he also lives with.) Source of Information:  Patient Patient Interpreter Needed:  None Criminal Activity/Legal Involvement Pertinent to Current Situation/Hospitalization:  No - Comment as needed Significant Relationships:  Adult Children (Daughter/[name] Short [phone]) Lives with:  Friends Do you feel safe going back to the place where you live?   (Patient is interested in SNF.) Need for family participation in patient care:  Yes (Comment) (Patient states his daughter would be considred his primary support.)  Care giving concerns:  SW met with patient at bedside. Patient states that he presents to hospital due to breaking his L hip. Patient states he broke his hip due to walking too fast. Patient states that prior to incident  he had assistance through an agency who comes out  Days a week for 3 hours. However, he states that he cannot remember the agency name. Patient states that he will still need assistance with ADLs. Patient is interested in SNF. Patient stats that he receives an income of $798.00 a month through disability.    Social Worker assessment / plan:  SW met with patient at bedside. Patient was alert and oriented. There was no family present. Patient states that he rents a home from his friend who he also lives with in [place]. Patient states that he happened to be in [place] due to coming to get clothes from his daughters house. Patient  states that his primary support is his daughter. SW will refer pt to SNF. SW has staffed this case with [place] Surveyor, quantity. SW will continue to follow up.   Employment status:  Disabled (Comment on whether or not currently receiving Disability) Insurance information:  Medicaid In [place] PT Recommendations:  [place] / Referral to community resources:   (SNF)  Patient/Family's Response to care:  Patient is appropriate.   Patient/Family's Understanding of and Emotional Response to Diagnosis, Current Treatment, and Prognosis:  No questions.   Emotional Assessment Appearance:  Appears stated age Attitude/Demeanor/Rapport:   (Appropriate) Affect (typically observed):  Appropriate Orientation:  Oriented to Self, Oriented to Place, Oriented to  Time, Oriented to Situation Alcohol / Substance use:  Not Applicable Psych involvement (Current and /or in the community):  No (Comment)  Discharge Needs  Concerns to be addressed:   (insurance) Readmission within the last 30 days:  No Current discharge risk:  None Barriers to Discharge:  No Barriers Identified   Tilda Burrow R 05/24/2016, 1:38 PM

## 2016-05-24 NOTE — Care Management (Signed)
Case manager spoke with Dr. Concerning patient's wish to go home with Home Health, per Dr. Jomarie LongsJoseph patient has cognitive issues and home is not a safe discharge.Social worker is continueing to seek facility placement.

## 2016-05-25 ENCOUNTER — Encounter (HOSPITAL_COMMUNITY): Payer: Self-pay | Admitting: General Practice

## 2016-05-25 LAB — CBC
HEMATOCRIT: 30 % — AB (ref 39.0–52.0)
HEMOGLOBIN: 9.9 g/dL — AB (ref 13.0–17.0)
MCH: 31.9 pg (ref 26.0–34.0)
MCHC: 33 g/dL (ref 30.0–36.0)
MCV: 96.8 fL (ref 78.0–100.0)
Platelets: 113 10*3/uL — ABNORMAL LOW (ref 150–400)
RBC: 3.1 MIL/uL — ABNORMAL LOW (ref 4.22–5.81)
RDW: 13.1 % (ref 11.5–15.5)
WBC: 10.6 10*3/uL — ABNORMAL HIGH (ref 4.0–10.5)

## 2016-05-25 MED ORDER — HYDROCODONE-ACETAMINOPHEN 5-325 MG PO TABS: 1.0000 | ORAL_TABLET | Freq: Four times a day (QID) | ORAL | 0 refills | Status: AC | PRN

## 2016-05-25 MED ORDER — POLYETHYLENE GLYCOL 3350 17 G PO PACK: 17.0000 g | PACK | Freq: Every day | ORAL | 0 refills | Status: AC

## 2016-05-25 NOTE — Progress Notes (Signed)
Subjective: 3 Days Post-Op Procedure(s) (LRB): TOTAL HIP ARTHROPLASTY ANTERIOR APPROACH (Left) Patient reports pain as moderate.  Slow progress with therapy.  No acute changes.  Objective: Vital signs in last 24 hours: Temp:  [98 F (36.7 C)-98.5 F (36.9 C)] 98 F (36.7 C) (11/02 0500) Pulse Rate:  [74-90] 90 (11/02 0500) Resp:  [16-18] 17 (11/02 0500) BP: (120-152)/(74-87) 152/87 (11/02 0500) SpO2:  [97 %-99 %] 98 % (11/02 0500)  Intake/Output from previous day: 11/01 0701 - 11/02 0700 In: 1200 [P.O.:1200] Out: 2350 [Urine:2350] Intake/Output this shift: No intake/output data recorded.   Recent Labs  05/23/16 0557 05/24/16 0605 05/25/16 0644  HGB 12.3* 10.4* 9.9*    Recent Labs  05/24/16 0605 05/25/16 0644  WBC 11.1* 10.6*  RBC 3.25* 3.10*  HCT 31.2* 30.0*  PLT 102* 113*    Recent Labs  05/23/16 0557 05/24/16 0605  NA 135 137  K 4.2 4.4  CL 107 104  CO2 22 26  BUN 15 26*  CREATININE 1.20 1.27*  GLUCOSE 129* 129*  CALCIUM 8.6* 8.6*   No results for input(s): LABPT, INR in the last 72 hours.  Sensation intact distally Intact pulses distally Dorsiflexion/Plantar flexion intact Incision: no drainage No cellulitis present Compartment soft  Assessment/Plan: 3 Days Post-Op Procedure(s) (LRB): TOTAL HIP ARTHROPLASTY ANTERIOR APPROACH (Left) Up with therapy Discharge to SNF likely vs home - ok to discharge from ortho standpoint  Kathryne HitchChristopher Y Kaylen Nghiem 05/25/2016, 7:59 AM

## 2016-05-25 NOTE — Discharge Summary (Signed)
Physician Discharge Summary  [name] ColeCarl Short ZOX:[id number] DOB: August 29, 1965 DOA: 05/20/2016  PCP: [name], [name], MD  Admit date: 05/20/2016 Discharge date: 05/25/2016  Time spent: 45 minutes  Recommendations for Outpatient Follow-up:  1. PCP in 1 week 2. Orthopedics Dr.Chris Magnus IvanBlackman in 1-2weeks   Discharge Diagnoses:  Principal Problem:   Femur fracture, left (HCC) Active Problems:   Left hemiparesis (HCC)   Cardiomyopathy (HCC)   Atrial fibrillation (HCC)   History of stroke   AIDS (acquired immunodeficiency syndrome), CD4 <=200 (HCC)   Fall (on) (from) other stairs and steps, sequela   Elevated troponin   Chest pain   SOB (shortness of breath)   Closed fracture of left hip (HCC)   Displaced fracture of base of neck of left femur, initial encounter for closed fracture (HCC)   Thrombocytopenia (HCC)   Discharge Condition: stable  Diet recommendation: heart healthy  Filed Weights   05/20/16 1757  Weight: 88.5 kg (195 lb)    History of present illness:  Roetta SessionsCarl Shepardis a 50 y.o.malewith medical history significant of recent dx of HIV, CM, CVA with left sided weakness, HTN, afib comes in after falling down a couple of stairs and injuring his left leg. Pt found to have left femoral fracture which is displaced.  Hospital Course:  Femur fracture, left (HCC) - Status post left total hip arthroplasty, postop day #3 - pain controlled, ambulating with PT - SNF for rehab was recommended, but patient is adamantly refusing SNF for [place]hab in [place] and wants to be in [place] with his family and [facility] - on eliquis for Afib so doesn't need additional DVT prophylaxis   Uncontrolled hypertension -BP stable now, continue lisinopril, Coreg    Left hemiparesis (HCC) with history of CVA - Due to prior CVA, at baseline walks with cane, no new neurological deficits - continue eliquis    Cardiomyopathy (HCC) - compensated - 2-D echo  showed EF of 35-40%, moderate hypokinesis of apex and lateral myocardium. (Prior echo in 2016 had shown EF of 55-60%.) - stable, continue lisinopril, Coreg.     Atrial fibrillation (HCC) -chadsvasc score >3 -Currently rate controlled, continue to hold eliquis     AIDS (acquired immunodeficiency syndrome), CD4 <=200 (HCC) - continue HAART meds, FU with ID    Mild renal insufficiency  - mild chronic, stable  Procedures: PROCEDURE:  Left total hip arthroplasty through direct anterior approach.  Consultations:  Orthopedics  Discharge Exam: Vitals:   05/24/16 1950 05/25/16 0500  BP: 121/74 (!) 152/87  Pulse: 86 90  Resp: 16 17  Temp: 98.4 F (36.9 C) 98 F (36.7 C)    General: AAOx3 Cardiovascular: S1S2/RRR Respiratory: CTAB  Discharge Instructions   Discharge Instructions    Diet - low sodium heart healthy    Complete by:  As directed    Full weight bearing    Complete by:  As directed    Increase activity slowly    Complete by:  As directed      Current Discharge Medication List    START taking these medications   Details  HYDROcodone-acetaminophen (NORCO/VICODIN) 5-325 MG tablet Take 1-2 tablets by mouth every 6 (six) hours as needed for moderate pain. Qty: 30 tablet, Refills: 0    polyethylene glycol (MIRALAX / GLYCOLAX) packet Take 17 g by mouth daily. While taking pain medications Qty: 14 each, Refills: 0      CONTINUE these medications which have NOT CHANGED   Details  apixaban (ELIQUIS)  2.5 MG TABS tablet Take 1 tablet (2.5 mg total) by mouth 2 (two) times daily. Qty: 60 tablet, Refills: 5   Associated Diagnoses: History of stroke    atorvastatin (LIPITOR) 20 MG tablet Take 1 tablet (20 mg total) by mouth daily at 6 PM. Qty: 30 tablet, Refills: 5   Associated Diagnoses: Acute ischemic right anterior cerebral artery (ACA) stroke (HCC)    carvedilol (COREG) 25 MG tablet Take 1 tablet (25 mg total) by mouth 2 (two) times daily with a meal. Qty:  60 tablet, Refills: 11   Associated Diagnoses: Essential hypertension    cetirizine (ZYRTEC) 10 MG tablet Take 1 tablet (10 mg total) by mouth daily. Qty: 30 tablet, Refills: 2   Associated Diagnoses: Seasonal allergies    diclofenac sodium (VOLTAREN) 1 % GEL Apply 2 g topically 4 (four) times daily. Qty: 100 g, Refills: 3   Associated Diagnoses: Left arm pain    elvitegravir-cobicistat-emtricitabine-tenofovir (GENVOYA) 150-150-200-10 MG TABS tablet Take 1 tablet by mouth daily with breakfast. Qty: 90 tablet, Refills: 3   Associated Diagnoses: AIDS (HCC)    levETIRAcetam (KEPPRA) 1000 MG tablet Take 1 tablet (1,000 mg total) by mouth 2 (two) times daily. Qty: 60 tablet, Refills: 5    lidocaine (XYLOCAINE) 5 % ointment APPLY OINTMENT TO THE AFFECTED AREA AS NEEDED Qty: 30 g, Refills: 0   Associated Diagnoses: Left arm pain    lisinopril (PRINIVIL,ZESTRIL) 40 MG tablet Take 1 tablet (40 mg total) by mouth daily. Qty: 30 tablet, Refills: 5   Associated Diagnoses: Essential hypertension    potassium chloride SA (K-DUR,KLOR-CON) 20 MEQ tablet Take 2 tablets (40 mEq total) by mouth daily. Qty: 180 tablet, Refills: 3    Skin Protectants, Misc. (EUCERIN) cream Apply topically as needed for wound care. Qty: 397 g, Refills: 0   Associated Diagnoses: Dermatitis    sulfamethoxazole-trimethoprim (BACTRIM) 400-80 MG tablet Take 1 tablet by mouth 3 (three) times a week. Qty: 30 tablet, Refills: 3   Associated Diagnoses: AIDS (HCC)    traZODone (DESYREL) 50 MG tablet Take 50 mg by mouth at bedtime.  Refills: 2    azithromycin (ZITHROMAX) 600 MG tablet TAKE 2 TABLETS BY MOUTH ONCE A WEEK Qty: 12 tablet, Refills: 1   Associated Diagnoses: AIDS (HCC)      STOP taking these medications     DULoxetine (CYMBALTA) 30 MG capsule      ketoconazole (NIZORAL) 2 % cream        No Known Allergies Follow-up Information    Kathryne Hitch, MD. Schedule an appointment as soon as  possible for a visit in 2 week(s).   Specialty:  Orthopedic Surgery Contact information: [address] [place] [address] [phone]        [name], MD. Schedule an appointment as soon as possible for a visit in 1 week(s).   Specialty:  Family Medicine Contact information: [address] [place] [address] [phone]            The results of significant diagnostics from this hospitalization (including imaging, microbiology, ancillary and laboratory) are listed below for reference.    Significant Diagnostic Studies: Dg Chest 1 View  Result Date: 05/20/2016 CLINICAL DATA:  Pt from home via EMS- Pt had CVA in 2016 and ambulates with cane. Pt missed a ledge walking onto a porch and fell. Pt c/o L hip pain. Pt is unable to bear weight on L hip and unable to move L extremity. Pt does  have deficits to L side from previous CVA EXAM: CHEST 1 VIEW COMPARISON:  Chest x-ray dated 10/19/2015. FINDINGS: Cardiomegaly appears stable. Lungs are clear. No pleural effusion or pneumothorax seen. Osseous structures about the chest are unremarkable. IMPRESSION: No active disease.  Stable cardiomegaly. Electronically Signed   By: Bary Richard M.D.   On: 05/20/2016 19:12   Pelvis Portable  Result Date: 05/22/2016 CLINICAL DATA:  Status post total hip replacement EXAM: PORTABLE PELVIS 1-2 VIEWS COMPARISON:  Intraoperative study obtained earlier in the day FINDINGS: There is a total hip replacement on the left with prosthetic components on the left well-seated. Soft tissue air on the left is an expected postoperative finding. No acute fracture or dislocation. There is mild narrowing of the right hip joint. IMPRESSION: Total hip replacement on the left with prosthetic components appearing well-seated on single frontal view. No acute fracture or dislocation. Osteoarthritic changes noted in the right hip joint. Electronically Signed   By: Bretta Bang III M.D.   On:  05/22/2016 20:01   Dg C-arm 1-60 Min  Result Date: 05/22/2016 CLINICAL DATA:  Left total hip arthroplasty EXAM: OPERATIVE left HIP (WITH PELVIS IF PERFORMED) 2 VIEWS TECHNIQUE: Fluoroscopic spot image(s) were submitted for interpretation post-operatively. COMPARISON:  Pelvic and left hip radiograph 05/20/2016 FINDINGS: Fluoroscopic images show the left hip V4 and after placement of a femoroacetabular prosthesis. Fluoroscopy time was reported as 30 seconds IMPRESSION: Intraoperative images of left total hip arthroplasty. Electronically Signed   By: Deatra Robinson M.D.   On: 05/22/2016 19:03   Dg Hip Operative Unilat W Or W/o Pelvis Left  Result Date: 05/22/2016 CLINICAL DATA:  Left total hip arthroplasty EXAM: OPERATIVE left HIP (WITH PELVIS IF PERFORMED) 2 VIEWS TECHNIQUE: Fluoroscopic spot image(s) were submitted for interpretation post-operatively. COMPARISON:  Pelvic and left hip radiograph 05/20/2016 FINDINGS: Fluoroscopic images show the left hip V4 and after placement of a femoroacetabular prosthesis. Fluoroscopy time was reported as 30 seconds IMPRESSION: Intraoperative images of left total hip arthroplasty. Electronically Signed   By: Deatra Robinson M.D.   On: 05/22/2016 19:03   Dg Hip Unilat With Pelvis 2-3 Views Left  Result Date: 05/20/2016 CLINICAL DATA:  Pt from home via EMS- Pt had CVA in 2016 and ambulates with cane. Pt missed a ledge walking onto a porch and fell. Pt c/o L hip pain. Pt is unable to bear weight on L hip and unable to move L extremity. Pt does have deficits to L side from previous CVA EXAM: DG HIP (WITH OR WITHOUT PELVIS) 2-3V LEFT COMPARISON:  None. FINDINGS: There is a displaced fracture within the subcapital region of the left femoral neck, with prominent angulation deformity approaching 90 degrees. Left femoral head appears to remain well-positioned relative to the acetabulum. No additional fracture or dislocation seen. Soft tissues about the pelvis and left hip are  unremarkable. IMPRESSION: Displaced fracture within the left femoral neck, subcapital region, with associated prominent angulation deformity. Electronically Signed   By: Bary Richard M.D.   On: 05/20/2016 19:14    Microbiology: Recent Results (from the past 240 hour(s))  Surgical pcr screen     Status: None   Collection Time: 05/21/16 11:56 PM  Result Value Ref Range Status   MRSA, PCR NEGATIVE NEGATIVE Final   Staphylococcus aureus NEGATIVE NEGATIVE Final    Comment:        The Xpert SA Assay (FDA approved for NASAL specimens in patients over 70 years of age), is one component of a comprehensive  surveillance program.  Test performance has been validated by [facility] for patients greater than or equal to 48 year old. It is not intended to diagnose infection nor to guide or monitor treatment.      Labs: Basic Metabolic Panel:  Recent Labs Lab 05/20/16 1809 05/21/16 0456 05/22/16 0502 05/23/16 0557 05/24/16 0605  NA 138 136 135 135 137  K 4.3 4.5 3.8 4.2 4.4  CL 109 105 107 107 104  CO2 25 25 21* 22 26  GLUCOSE 112* 98 88 129* 129*  BUN 14 14 14 15  26*  CREATININE 1.51* 1.39* 1.29* 1.20 1.27*  CALCIUM 9.0 9.1 8.7* 8.6* 8.6*   Liver Function Tests: No results for input(s): AST, ALT, ALKPHOS, BILITOT, PROT, ALBUMIN in the last 168 hours. No results for input(s): LIPASE, AMYLASE in the last 168 hours. No results for input(s): AMMONIA in the last 168 hours. CBC:  Recent Labs Lab 05/20/16 1809 05/21/16 0456 05/22/16 0502 05/23/16 0557 05/24/16 0605 05/25/16 0644  WBC 5.3 9.8 6.6 7.3 11.1* 10.6*  NEUTROABS 3.2  --   --   --   --   --   HGB 13.6 14.2 13.2 12.3* 10.4* 9.9*  HCT 41.3 43.0 40.0 37.5* 31.2* 30.0*  MCV 99.3 98.2 97.3 98.9 96.0 96.8  PLT 124* 122* 111* 89* 102* 113*   Cardiac Enzymes:  Recent Labs Lab 05/20/16 1809 05/21/16 0652  TROPONINI 0.04* 0.06*   BNP: BNP (last 3 results) No results for input(s): BNP in the last 8760  hours.  ProBNP (last 3 results) No results for input(s): PROBNP in the last 8760 hours.  CBG: No results for input(s): GLUCAP in the last 168 hours.     SignedZannie Cove:  Juanangel Soderholm MD.  [facility] 05/25/2016, 3:32 PM

## 2016-05-25 NOTE — Care Management (Signed)
Case manager received call back from [facility] stating that they will not be able to accept patient, they do not accept medicaid. CM informed liaison that on yesterday it was stated they did accept medicaid. Case manager contacted [facility] in [place], spoke with Alycia RossettiRyan, who states they will accept patient, not sure how many visits he will receive but they will review his paperwork and start of care will be 05/29/16.

## 2016-05-25 NOTE — Progress Notes (Signed)
Physical Therapy Treatment Patient Details Name: [name] Short MRN: [id number] DOB: 07-07-1966 Today's Date: 05/25/2016    History of Present Illness 50 y.o. male s/p fall down stairs with resultant L hip fx s/p L THA (direct anterior).  Pt with significant PMHx of CVA with left sided weakness (walks with a cane at baseline), HIV, TB, seizure, cardiomyopathy, and HTN.    PT Comments    Pt able to ambulate 36' with RW and MIN A today.  He did need A progressing RW and cues for L LE placement and also had chair follow for safety.  Con't to recommend SNF as pt lives with 12 y/o daughter and he is not able to care for self at home.  He should progress well in short term SNF rehab and is motivated.  Follow Up Recommendations  SNF     Equipment Recommendations  Rolling walker with 5" wheels;Wheelchair (measurements PT);Wheelchair cushion (measurements PT)    Recommendations for Other Services       Precautions / Restrictions Precautions Precautions: Fall Restrictions Weight Bearing Restrictions: Yes LLE Weight Bearing: Weight bearing as tolerated    Mobility  Bed Mobility   Bed Mobility: Supine to Sit     Supine to sit: Min guard;HOB elevated     General bed mobility comments: Pt able to get to EOB without physical A  Transfers Overall transfer level: Needs assistance Equipment used: Rolling walker (2 wheeled) Transfers: Sit to/from Stand Sit to Stand: Min assist         General transfer comment: Took 2 attempts  Ambulation/Gait Ambulation/Gait assistance: Min assist;+2 safety/equipment Ambulation Distance (Feet): 28 Feet Assistive device: Rolling walker (2 wheeled) Gait Pattern/deviations: Step-to pattern;Decreased dorsiflexion - left;Antalgic Gait velocity: decreased   General Gait Details: Pt with L hip hike for L foot clearance which fluctuated and needed cues.  He needed A for RW progression.  Tenedency to hit L side of RW with L foot.  Pt began to c/o R knee  discomfort at end of gait.  Chair follow   Stairs            Wheelchair Mobility    Modified Rankin (Stroke Patients Only)       Balance     Sitting balance-Leahy Scale: Fair     Standing balance support: Bilateral upper extremity supported Standing balance-Leahy Scale: Poor                      Cognition Arousal/Alertness: Awake/alert Behavior During Therapy: WFL for tasks assessed/performed Overall Cognitive Status: Within Functional Limits for tasks assessed                      Exercises Total Joint Exercises Ankle Circles/Pumps: AROM;AAROM;Right;Left;20 reps Gluteal Sets: Strengthening;Both;5 reps Heel Slides: AAROM;Left;10 reps Hip ABduction/ADduction: AAROM;Left;10 reps    General Comments        Pertinent Vitals/Pain Pain Assessment: 0-10 Pain Score: 4  Pain Location: L knee Pain Descriptors / Indicators: Aching Pain Intervention(s): Limited activity within patient's tolerance;Monitored during session;Repositioned;Ice applied    Home Living                      Prior Function            PT Goals (current goals can now be found in the care plan section) Acute Rehab PT Goals Patient Stated Goal: to move back to [place] and get some assistance at home after rehab PT Goal Formulation: With  patient Time For Goal Achievement: 05/30/16 Potential to Achieve Goals: Good Progress towards PT goals: Goals met and updated - see care plan    Frequency    Min 3X/week      PT Plan Current plan remains appropriate    Co-evaluation             End of Session Equipment Utilized During Treatment: Gait belt Activity Tolerance: Patient tolerated treatment well Patient left: in chair;with call bell/phone within reach     Time: 1046-1105 PT Time Calculation (min) (ACUTE ONLY): 19 min  Charges:  $Gait Training: 8-22 mins                    G Codes:      Andrej Spagnoli LUBECK 05/25/2016, 11:25 AM

## 2016-05-25 NOTE — Progress Notes (Signed)
Went discharge papers and medications with full understanding,pt states will be picked up by friend this evening not sure what time friend will be here

## 2016-05-25 NOTE — Progress Notes (Signed)
Dr. Jomarie LongsJoseph sent a page to notify patient with no BM since 10/28. PRN medication requested to help move bowel.

## 2016-05-25 NOTE — Progress Notes (Signed)
D/c condom cath with 300 cc of urine

## 2016-05-25 NOTE — Progress Notes (Signed)
SW met with patient at bedside. Patient was alert and oriented. There was no family present.   SW spoke with patient at bedside regarding facilities and informed him that the bed options available were local. Patient declined offer. Patient informed SW that he prefers to go home to to [place]. Patient states that he wont stay in a local SNF. SW made Nurse CM and CSW Surveyor, quantity aware.   Tilda Burrow, MSW [phone]

## 2016-05-25 NOTE — Progress Notes (Signed)
Patient leaving unit, discharged home with daughters via wheelchair.

## 2016-05-26 NOTE — Care Management (Signed)
Case manager received a call from someone on behalf of [name] Short. They stated that he thought he had someone coming to his home daily for Home Health. CM explained chain of events to them and was told that they had him in the care and were taking him to the hospital , CM advised that they take him to a hospital nearest his home. CM explained that patient was offered assistance with placement in a facility in [place] and He refused, stating he would go home to [name].

## 2016-05-27 ENCOUNTER — Other Ambulatory Visit: Payer: Self-pay | Admitting: Family Medicine

## 2016-05-27 DIAGNOSIS — M79602 Pain in left arm: Secondary | ICD-10-CM

## 2016-06-07 ENCOUNTER — Other Ambulatory Visit: Payer: Self-pay | Admitting: Family Medicine

## 2016-06-07 DIAGNOSIS — J302 Other seasonal allergic rhinitis: Secondary | ICD-10-CM

## 2016-08-28 ENCOUNTER — Other Ambulatory Visit: Payer: Self-pay | Admitting: Family Medicine

## 2016-08-28 DIAGNOSIS — I10 Essential (primary) hypertension: Secondary | ICD-10-CM

## 2016-09-17 IMAGING — MR MR HEAD W/O CM
9 of 11 series · 29 of 48 positions shown · non-contrast
Comparison: 10/31/2014 brain MR and head CT.

CLINICAL DATA: 49-year-old hypertensive male with acute onset of
left-sided weakness. Fell secondary to weakness. Subsequent
encounter.

EXAM:
MRI HEAD WITHOUT CONTRAST
MRA HEAD WITHOUT CONTRAST
TECHNIQUE: Multiplanar, multiecho pulse sequences of the brain and surrounding
structures were obtained without intravenous contrast. Angiographic
images of the head were obtained using MRA technique without
contrast.

[Series 4: DWI · axial · 3.0mm · 1.09mm/px · z∈[-37,+107]mm · 7 of 98 slices shown (1 of 4)]
[im 1/98]
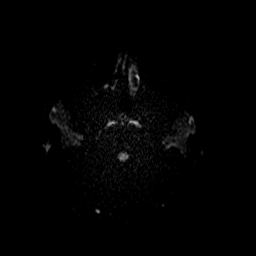
[im 17/98]
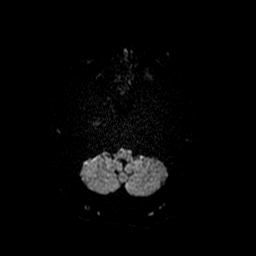
[im 33/98]
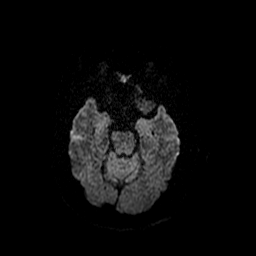
[im 49/98]
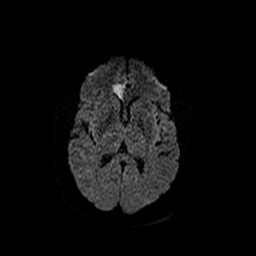
[im 65/98]
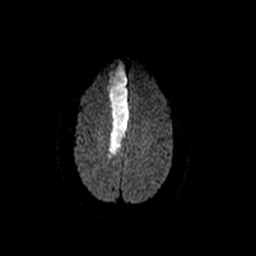
[im 81/98]
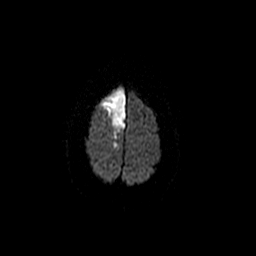
[im 98/98]
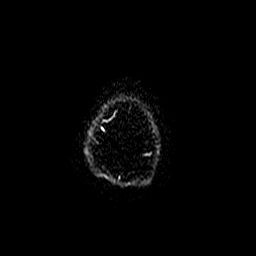

[Series 5: DWI · coronal · 5.0mm · 1.09mm/px · 5 of 68 slices shown (2 of 4)]
[im 1/68]
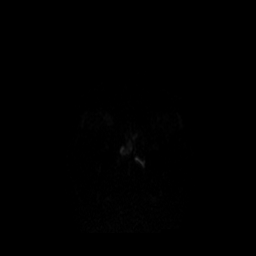
[im 17/68]
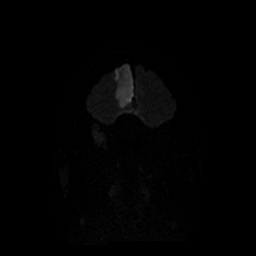
[im 34/68]
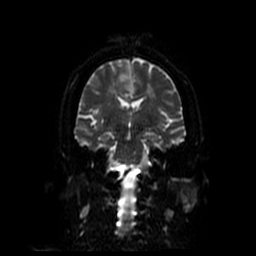
[im 51/68]
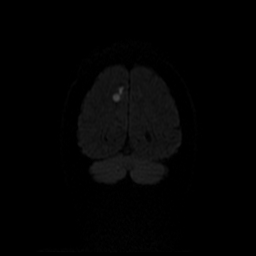
[im 68/68]
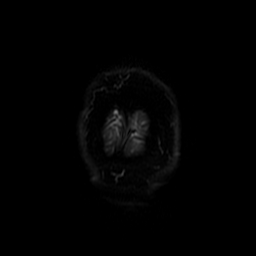

[Series 6: T2 · axial · 5.0mm · 0.43mm/px · z∈[-39,+105]mm · 2 of 25 slices shown (1 of 2)]
[im 1/25]
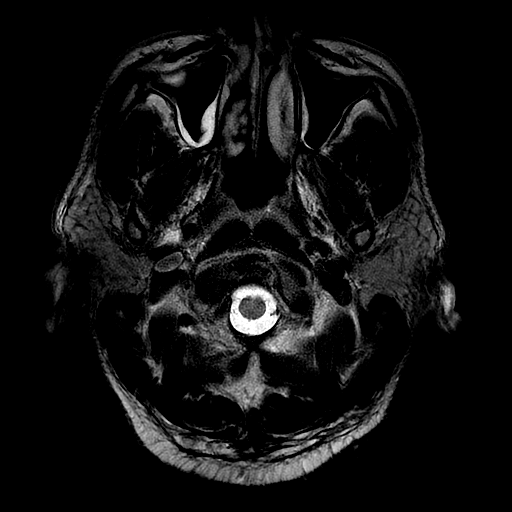
[im 25/25]
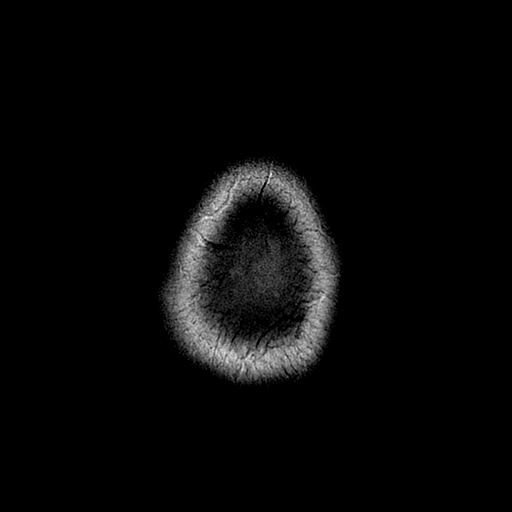

[Series 7: FLAIR · axial · 5.0mm · 0.43mm/px · z∈[-39,+105]mm · 2 of 25 slices shown]
[im 1/25]
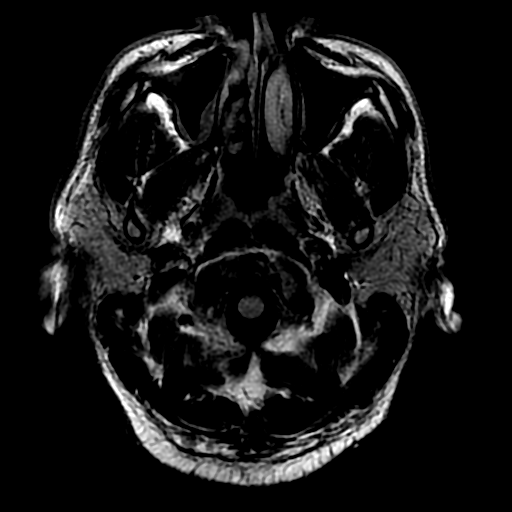
[im 25/25]
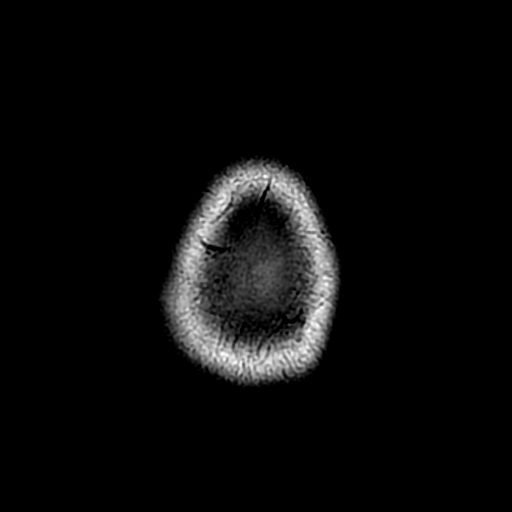

[Series 8: ax mpgr · axial · 5.0mm · 0.43mm/px · z∈[-37,+103]mm · 2 of 21 slices shown]
[im 1/21]
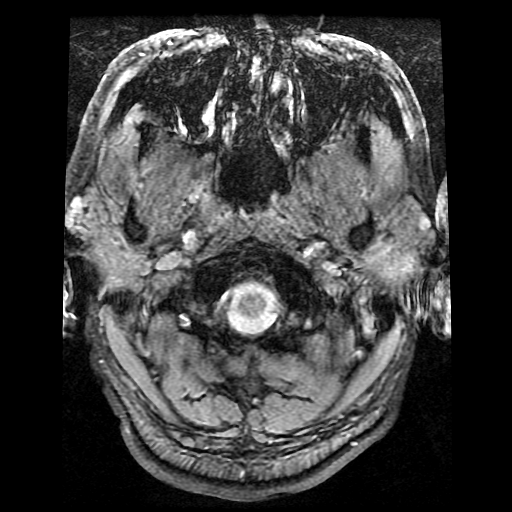
[im 21/21]
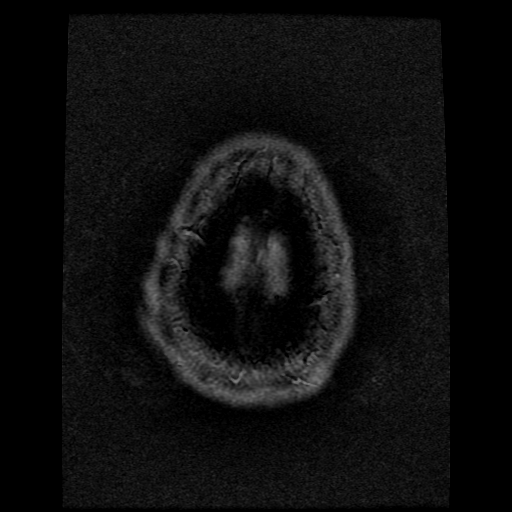

[Series 10: T1 · sagittal · 5.0mm · 0.47mm/px · 2 of 23 slices shown]
[im 1/23]
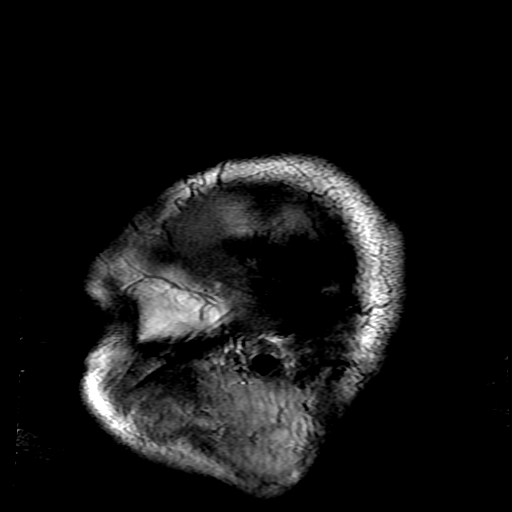
[im 23/23]
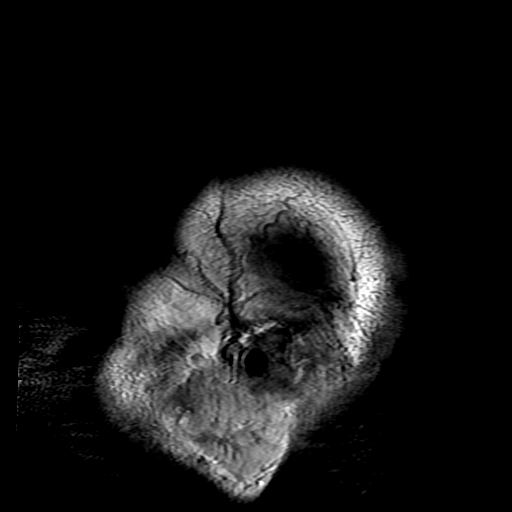

[Series 11: T2 · coronal · 5.0mm · 0.43mm/px · 2 of 29 slices shown (2 of 2)]
[im 1/29]
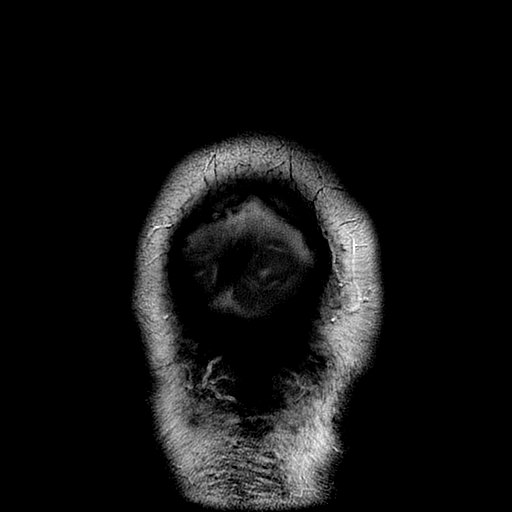
[im 29/29]
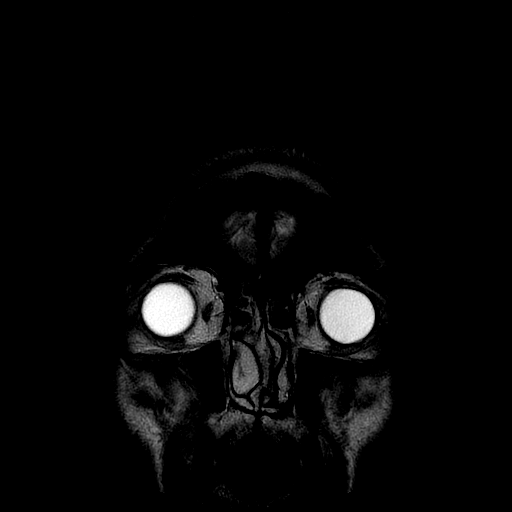

[Series 400: DWI · axial · 3.0mm · 1.09mm/px · z∈[-37,+107]mm · 4 of 49 slices shown (3 of 4)]
[im 1/49]
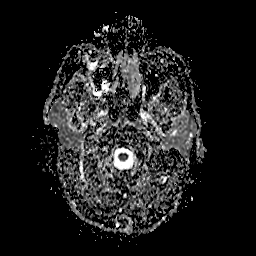
[im 17/49]
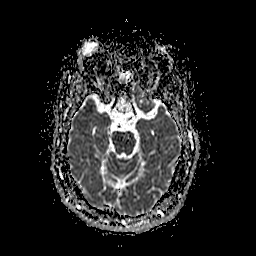
[im 33/49]
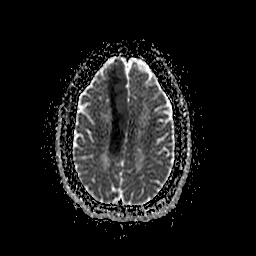
[im 49/49]
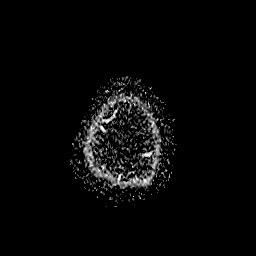

[Series 500: DWI · coronal · 5.0mm · 1.09mm/px · 3 of 34 slices shown (4 of 4)]
[im 1/34]
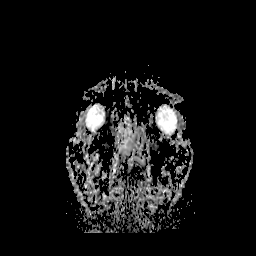
[im 17/34]
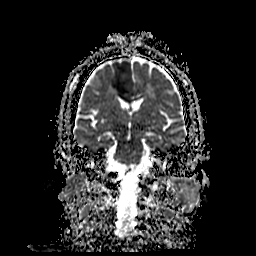
[im 34/34]
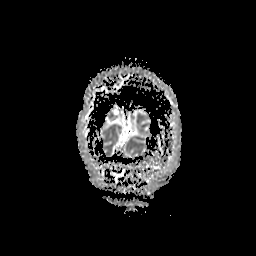

[29 of 48 positions shown; findings below may reference images not displayed]

FINDINGS: MRI HEAD FINDINGS

Acute moderate size nonhemorrhagic right anterior cerebral artery
distribution infarct involving the medial aspect of the right
frontal and parietal lobe and right aspect of the corpus callosum.
Local mass effect.

Tiny blood breakdown products cerebellum bilaterally may reflect
result of prior hemorrhagic ischemia or prior trauma.

Moderate white matter type changes periventricular region advanced
for patient's age and most likely related to result of small vessel
disease in this hypertensive obese patient. Other causes white
matter type changes felt to be less likely considerations.

Pontine altered signal intensity may represent prominent
perivascular spaces or result of prior small infarct.

No intracranial mass lesion noted on this unenhanced exam.

No hydrocephalus.

Exophthalmos.

Mild mucosal thickening paranasal sinuses most notable inferior
right maxillary sinus with maximal thickness of 5 mm.

Mild cervical spondylotic changes C3-4 mild spinal stenosis.
Cervical medullary junction, pituitary region and pineal region
unremarkable.

MRA HEAD FINDINGS

Aplastic A1 segment of the left anterior cerebral artery with A2
segment of the anterior cerebral artery supplied from the right
bilaterally. Irregularity of the A2 segment of the anterior cerebral
bilaterally with abrupt cut off of flow of the A2 segment of the
right anterior cerebral artery 1.5 cm above its origin consistent
with patient's acute right anterior cerebral artery distribution
infarct.

There is a small bulge at the left anterior cerebral artery A1-A2
junction which may represent origin of a small vessel although
difficult to completely exclude a a tiny aneurysm. The mild motion
degradation somewhat limits evaluation on source images.

Mild irregularity and minimal narrowing cavernous segment right
internal carotid artery.

Fetal type origin of the left posterior cerebral artery.

Middle cerebral artery mild to moderate branch vessel irregularity
with narrowing.

Left vertebral artery is poorly delineated and appears to end in a
posterior inferior cerebellar artery distribution. Left posterior
inferior cerebellar artery with moderate tandem stenoses.

Ectatic right vertebral artery without high-grade stenosis.

Moderate tandem stenosis right posterior inferior cerebellar artery.

Ectatic basilar artery with areas of mild to slight moderate
narrowing most notable mid aspect.

Nonvisualized anterior inferior cerebellar arteries.

Mild to moderate irregularity superior cerebellar artery
bilaterally.

Mild narrowing proximal right posterior cerebral artery. Fetal type
origin of the left posterior cerebral artery. Moderate narrowing
portions of P2/P3 and distal branches of the posterior cerebral
artery bilaterally.
IMPRESSION: This exam was interpreted during a PACS downtime with limited
availability of comparison cases.

MRI HEAD

Acute moderate size nonhemorrhagic right anterior cerebral artery
distribution infarct involving the medial aspect of the right
frontal and parietal lobe and right aspect of the corpus callosum.
Local mass effect.

Tiny blood breakdown products cerebellum bilaterally may reflect
result of prior hemorrhagic ischemia or prior trauma.

Moderate white matter type changes periventricular region advanced
for patient's age and most likely related to result of small vessel
disease in this hypertensive obese patient.

Pontine altered signal intensity may represent prominent
perivascular spaces or result of prior small infarct.

Mild mucosal thickening paranasal sinuses most notable inferior
right maxillary sinus with maximal thickness of 5 mm.

MRA HEAD

Aplastic A1 segment of the left anterior cerebral artery with A2
segment of the anterior cerebral artery supplied from the right
bilaterally. Irregularity of the A2 segment of the anterior cerebral
bilaterally with abrupt cut off of flow of the A2 segment of the
right anterior cerebral artery 1.5 cm above its origin consistent
with patient's acute right anterior cerebral artery distribution
infarct.

There is a small bulge at the left anterior cerebral artery A1-A2
junction which may represent origin of a small vessel although
difficult to completely exclude a a tiny aneurysm. The mild motion
degradation somewhat limits evaluation on source images.

Middle cerebral artery mild to moderate branch vessel irregularity
with narrowing.

Posterior fossa atherosclerotic type changes as detailed above.

## 2016-09-25 ENCOUNTER — Other Ambulatory Visit: Payer: Self-pay | Admitting: Infectious Diseases

## 2016-09-25 DIAGNOSIS — B20 Human immunodeficiency virus [HIV] disease: Secondary | ICD-10-CM

## 2016-09-26 ENCOUNTER — Other Ambulatory Visit: Payer: Self-pay | Admitting: *Deleted

## 2016-09-26 DIAGNOSIS — B20 Human immunodeficiency virus [HIV] disease: Secondary | ICD-10-CM

## 2016-09-26 MED ORDER — SULFAMETHOXAZOLE-TRIMETHOPRIM 400-80 MG PO TABS: 1.0000 | ORAL_TABLET | ORAL | 1 refills | Status: AC

## 2016-09-27 ENCOUNTER — Other Ambulatory Visit: Payer: Self-pay | Admitting: Infectious Diseases

## 2016-09-27 DIAGNOSIS — B20 Human immunodeficiency virus [HIV] disease: Secondary | ICD-10-CM

## 2016-10-01 ENCOUNTER — Other Ambulatory Visit: Payer: Self-pay | Admitting: Family Medicine

## 2016-10-01 DIAGNOSIS — Z8673 Personal history of transient ischemic attack (TIA), and cerebral infarction without residual deficits: Secondary | ICD-10-CM

## 2016-11-04 ENCOUNTER — Other Ambulatory Visit: Payer: Self-pay | Admitting: Family Medicine

## 2016-12-06 ENCOUNTER — Encounter: Payer: Self-pay | Admitting: Family Medicine

## 2017-01-03 ENCOUNTER — Other Ambulatory Visit: Payer: Self-pay | Admitting: Family Medicine

## 2017-01-13 ENCOUNTER — Other Ambulatory Visit: Payer: Self-pay | Admitting: Family Medicine

## 2017-01-13 DIAGNOSIS — I63521 Cerebral infarction due to unspecified occlusion or stenosis of right anterior cerebral artery: Secondary | ICD-10-CM

## 2017-08-01 ENCOUNTER — Other Ambulatory Visit: Payer: Self-pay | Admitting: Infectious Diseases

## 2017-08-01 DIAGNOSIS — B20 Human immunodeficiency virus [HIV] disease: Secondary | ICD-10-CM
